# Patient Record
Sex: Female | Born: 1955
Health system: Southern US, Community
[De-identification: ages and names within clinical notes are randomized; demographics above are authoritative.]

## PROBLEM LIST (undated history)

## (undated) DIAGNOSIS — F329 Major depressive disorder, single episode, unspecified: Secondary | ICD-10-CM

## (undated) DIAGNOSIS — K5792 Diverticulitis of intestine, part unspecified, without perforation or abscess without bleeding: Secondary | ICD-10-CM

## (undated) DIAGNOSIS — I10 Essential (primary) hypertension: Secondary | ICD-10-CM

## (undated) DIAGNOSIS — J309 Allergic rhinitis, unspecified: Secondary | ICD-10-CM

## (undated) DIAGNOSIS — R270 Ataxia, unspecified: Secondary | ICD-10-CM

## (undated) DIAGNOSIS — I872 Venous insufficiency (chronic) (peripheral): Secondary | ICD-10-CM

## (undated) DIAGNOSIS — L97909 Non-pressure chronic ulcer of unspecified part of unspecified lower leg with unspecified severity: Secondary | ICD-10-CM

## (undated) DIAGNOSIS — E785 Hyperlipidemia, unspecified: Secondary | ICD-10-CM

## (undated) DIAGNOSIS — R42 Dizziness and giddiness: Secondary | ICD-10-CM

## (undated) DIAGNOSIS — I83009 Varicose veins of unspecified lower extremity with ulcer of unspecified site: Secondary | ICD-10-CM

## (undated) DIAGNOSIS — F32A Depression, unspecified: Secondary | ICD-10-CM

## (undated) DIAGNOSIS — G809 Cerebral palsy, unspecified: Secondary | ICD-10-CM

## (undated) DIAGNOSIS — K5790 Diverticulosis of intestine, part unspecified, without perforation or abscess without bleeding: Secondary | ICD-10-CM

## (undated) DIAGNOSIS — R7301 Impaired fasting glucose: Secondary | ICD-10-CM

## (undated) HISTORY — DX: Depression, unspecified: F32.A

## (undated) HISTORY — DX: Allergic rhinitis, unspecified: J30.9

## (undated) HISTORY — DX: Varicose veins of unspecified lower extremity with ulcer of unspecified site: I83.009

## (undated) HISTORY — DX: Essential (primary) hypertension: I10

## (undated) HISTORY — DX: Venous insufficiency (chronic) (peripheral): I87.2

## (undated) HISTORY — PX: LAPAROSCOPY: SHX197

## (undated) HISTORY — PX: OTHER SURGICAL HISTORY: SHX169

## (undated) HISTORY — DX: Impaired fasting glucose: R73.01

## (undated) HISTORY — DX: Diverticulitis of intestine, part unspecified, without perforation or abscess without bleeding: K57.92

## (undated) HISTORY — DX: Major depressive disorder, single episode, unspecified: F32.9

## (undated) HISTORY — DX: Diverticulosis of intestine, part unspecified, without perforation or abscess without bleeding: K57.90

## (undated) HISTORY — DX: Dizziness and giddiness: R42

## (undated) HISTORY — PX: ABDOMINAL HYSTERECTOMY: SHX81

## (undated) HISTORY — DX: Cerebral palsy, unspecified: G80.9

## (undated) HISTORY — DX: Ataxia, unspecified: R27.0

## (undated) HISTORY — DX: Non-pressure chronic ulcer of unspecified part of unspecified lower leg with unspecified severity: L97.909

## (undated) HISTORY — DX: Hyperlipidemia, unspecified: E78.5

---

## 2006-04-15 ENCOUNTER — Ambulatory Visit: Payer: Self-pay | Admitting: Neurology

## 2008-02-16 ENCOUNTER — Ambulatory Visit: Payer: Self-pay | Admitting: Unknown Physician Specialty

## 2008-02-17 ENCOUNTER — Other Ambulatory Visit: Payer: Self-pay

## 2008-02-17 ENCOUNTER — Emergency Department: Payer: Self-pay | Admitting: Emergency Medicine

## 2008-02-23 ENCOUNTER — Inpatient Hospital Stay: Payer: Self-pay | Admitting: Unknown Physician Specialty

## 2010-11-11 ENCOUNTER — Emergency Department: Payer: Self-pay | Admitting: Unknown Physician Specialty

## 2010-11-18 ENCOUNTER — Emergency Department: Payer: Self-pay | Admitting: Emergency Medicine

## 2011-12-24 DIAGNOSIS — G809 Cerebral palsy, unspecified: Secondary | ICD-10-CM | POA: Diagnosis not present

## 2011-12-24 DIAGNOSIS — I69993 Ataxia following unspecified cerebrovascular disease: Secondary | ICD-10-CM | POA: Diagnosis not present

## 2011-12-28 DIAGNOSIS — M79609 Pain in unspecified limb: Secondary | ICD-10-CM | POA: Diagnosis not present

## 2011-12-28 DIAGNOSIS — M7989 Other specified soft tissue disorders: Secondary | ICD-10-CM | POA: Diagnosis not present

## 2011-12-28 DIAGNOSIS — I872 Venous insufficiency (chronic) (peripheral): Secondary | ICD-10-CM | POA: Diagnosis not present

## 2011-12-28 DIAGNOSIS — I739 Peripheral vascular disease, unspecified: Secondary | ICD-10-CM | POA: Diagnosis not present

## 2012-05-31 DIAGNOSIS — E785 Hyperlipidemia, unspecified: Secondary | ICD-10-CM | POA: Diagnosis not present

## 2012-05-31 DIAGNOSIS — I10 Essential (primary) hypertension: Secondary | ICD-10-CM | POA: Diagnosis not present

## 2012-06-09 DIAGNOSIS — I69993 Ataxia following unspecified cerebrovascular disease: Secondary | ICD-10-CM | POA: Diagnosis not present

## 2012-06-09 DIAGNOSIS — G809 Cerebral palsy, unspecified: Secondary | ICD-10-CM | POA: Diagnosis not present

## 2012-06-29 DIAGNOSIS — I69993 Ataxia following unspecified cerebrovascular disease: Secondary | ICD-10-CM | POA: Diagnosis not present

## 2012-06-29 DIAGNOSIS — G809 Cerebral palsy, unspecified: Secondary | ICD-10-CM | POA: Diagnosis not present

## 2012-08-23 DIAGNOSIS — Z23 Encounter for immunization: Secondary | ICD-10-CM | POA: Diagnosis not present

## 2012-10-31 DIAGNOSIS — E7800 Pure hypercholesterolemia, unspecified: Secondary | ICD-10-CM | POA: Diagnosis not present

## 2012-10-31 DIAGNOSIS — F329 Major depressive disorder, single episode, unspecified: Secondary | ICD-10-CM | POA: Diagnosis not present

## 2012-10-31 DIAGNOSIS — J309 Allergic rhinitis, unspecified: Secondary | ICD-10-CM | POA: Diagnosis not present

## 2012-10-31 DIAGNOSIS — R5381 Other malaise: Secondary | ICD-10-CM | POA: Diagnosis not present

## 2012-10-31 DIAGNOSIS — R42 Dizziness and giddiness: Secondary | ICD-10-CM | POA: Diagnosis not present

## 2012-10-31 DIAGNOSIS — I10 Essential (primary) hypertension: Secondary | ICD-10-CM | POA: Diagnosis not present

## 2012-10-31 DIAGNOSIS — Z0000 Encounter for general adult medical examination without abnormal findings: Secondary | ICD-10-CM | POA: Diagnosis not present

## 2012-12-26 DIAGNOSIS — G809 Cerebral palsy, unspecified: Secondary | ICD-10-CM | POA: Diagnosis not present

## 2012-12-26 DIAGNOSIS — I69993 Ataxia following unspecified cerebrovascular disease: Secondary | ICD-10-CM | POA: Diagnosis not present

## 2013-01-25 DIAGNOSIS — K644 Residual hemorrhoidal skin tags: Secondary | ICD-10-CM | POA: Diagnosis not present

## 2013-06-09 DIAGNOSIS — L259 Unspecified contact dermatitis, unspecified cause: Secondary | ICD-10-CM | POA: Diagnosis not present

## 2013-06-09 DIAGNOSIS — I69993 Ataxia following unspecified cerebrovascular disease: Secondary | ICD-10-CM | POA: Diagnosis not present

## 2013-06-09 DIAGNOSIS — G809 Cerebral palsy, unspecified: Secondary | ICD-10-CM | POA: Diagnosis not present

## 2013-06-13 DIAGNOSIS — L03119 Cellulitis of unspecified part of limb: Secondary | ICD-10-CM | POA: Diagnosis not present

## 2013-06-13 DIAGNOSIS — F329 Major depressive disorder, single episode, unspecified: Secondary | ICD-10-CM | POA: Diagnosis not present

## 2013-06-13 DIAGNOSIS — E7800 Pure hypercholesterolemia, unspecified: Secondary | ICD-10-CM | POA: Diagnosis not present

## 2013-06-13 DIAGNOSIS — L0291 Cutaneous abscess, unspecified: Secondary | ICD-10-CM | POA: Diagnosis not present

## 2013-06-13 DIAGNOSIS — L02419 Cutaneous abscess of limb, unspecified: Secondary | ICD-10-CM | POA: Diagnosis not present

## 2013-06-13 DIAGNOSIS — I10 Essential (primary) hypertension: Secondary | ICD-10-CM | POA: Diagnosis not present

## 2013-06-22 DIAGNOSIS — Z1231 Encounter for screening mammogram for malignant neoplasm of breast: Secondary | ICD-10-CM | POA: Diagnosis not present

## 2013-06-22 DIAGNOSIS — N63 Unspecified lump in unspecified breast: Secondary | ICD-10-CM | POA: Diagnosis not present

## 2013-06-22 DIAGNOSIS — G809 Cerebral palsy, unspecified: Secondary | ICD-10-CM | POA: Diagnosis not present

## 2013-06-30 DIAGNOSIS — N6489 Other specified disorders of breast: Secondary | ICD-10-CM | POA: Diagnosis not present

## 2013-06-30 DIAGNOSIS — R928 Other abnormal and inconclusive findings on diagnostic imaging of breast: Secondary | ICD-10-CM | POA: Diagnosis not present

## 2013-09-11 DIAGNOSIS — Z23 Encounter for immunization: Secondary | ICD-10-CM | POA: Diagnosis not present

## 2013-10-03 DIAGNOSIS — I69993 Ataxia following unspecified cerebrovascular disease: Secondary | ICD-10-CM | POA: Diagnosis not present

## 2013-10-03 DIAGNOSIS — G809 Cerebral palsy, unspecified: Secondary | ICD-10-CM | POA: Diagnosis not present

## 2013-11-02 DIAGNOSIS — I872 Venous insufficiency (chronic) (peripheral): Secondary | ICD-10-CM | POA: Diagnosis not present

## 2013-11-02 DIAGNOSIS — Z1331 Encounter for screening for depression: Secondary | ICD-10-CM | POA: Diagnosis not present

## 2013-11-02 DIAGNOSIS — Z0000 Encounter for general adult medical examination without abnormal findings: Secondary | ICD-10-CM | POA: Diagnosis not present

## 2013-11-02 DIAGNOSIS — E785 Hyperlipidemia, unspecified: Secondary | ICD-10-CM | POA: Diagnosis not present

## 2013-11-02 DIAGNOSIS — R7301 Impaired fasting glucose: Secondary | ICD-10-CM | POA: Diagnosis not present

## 2013-11-02 DIAGNOSIS — I10 Essential (primary) hypertension: Secondary | ICD-10-CM | POA: Diagnosis not present

## 2014-05-03 DIAGNOSIS — E785 Hyperlipidemia, unspecified: Secondary | ICD-10-CM | POA: Diagnosis not present

## 2014-05-03 DIAGNOSIS — I10 Essential (primary) hypertension: Secondary | ICD-10-CM | POA: Diagnosis not present

## 2014-09-07 DIAGNOSIS — Z23 Encounter for immunization: Secondary | ICD-10-CM | POA: Diagnosis not present

## 2014-11-14 DIAGNOSIS — F339 Major depressive disorder, recurrent, unspecified: Secondary | ICD-10-CM | POA: Diagnosis not present

## 2014-11-14 DIAGNOSIS — I10 Essential (primary) hypertension: Secondary | ICD-10-CM | POA: Diagnosis not present

## 2014-11-14 DIAGNOSIS — Z0000 Encounter for general adult medical examination without abnormal findings: Secondary | ICD-10-CM | POA: Diagnosis not present

## 2014-11-14 DIAGNOSIS — R7301 Impaired fasting glucose: Secondary | ICD-10-CM | POA: Diagnosis not present

## 2014-11-14 DIAGNOSIS — K5732 Diverticulitis of large intestine without perforation or abscess without bleeding: Secondary | ICD-10-CM | POA: Diagnosis not present

## 2014-11-14 DIAGNOSIS — E785 Hyperlipidemia, unspecified: Secondary | ICD-10-CM | POA: Diagnosis not present

## 2014-11-14 DIAGNOSIS — G809 Cerebral palsy, unspecified: Secondary | ICD-10-CM | POA: Diagnosis not present

## 2014-11-14 DIAGNOSIS — I872 Venous insufficiency (chronic) (peripheral): Secondary | ICD-10-CM | POA: Diagnosis not present

## 2014-11-21 DIAGNOSIS — Z1231 Encounter for screening mammogram for malignant neoplasm of breast: Secondary | ICD-10-CM | POA: Diagnosis not present

## 2015-01-05 ENCOUNTER — Emergency Department: Payer: Self-pay | Admitting: Emergency Medicine

## 2015-01-05 DIAGNOSIS — R590 Localized enlarged lymph nodes: Secondary | ICD-10-CM | POA: Diagnosis not present

## 2015-01-05 DIAGNOSIS — R1031 Right lower quadrant pain: Secondary | ICD-10-CM | POA: Diagnosis not present

## 2015-01-05 DIAGNOSIS — N289 Disorder of kidney and ureter, unspecified: Secondary | ICD-10-CM | POA: Diagnosis not present

## 2015-01-05 DIAGNOSIS — N2889 Other specified disorders of kidney and ureter: Secondary | ICD-10-CM | POA: Diagnosis not present

## 2015-01-05 DIAGNOSIS — Z90710 Acquired absence of both cervix and uterus: Secondary | ICD-10-CM | POA: Diagnosis not present

## 2015-01-05 DIAGNOSIS — K769 Liver disease, unspecified: Secondary | ICD-10-CM | POA: Diagnosis not present

## 2015-01-05 DIAGNOSIS — N390 Urinary tract infection, site not specified: Secondary | ICD-10-CM | POA: Diagnosis not present

## 2015-01-05 DIAGNOSIS — K8020 Calculus of gallbladder without cholecystitis without obstruction: Secondary | ICD-10-CM | POA: Diagnosis not present

## 2015-02-16 DIAGNOSIS — L97521 Non-pressure chronic ulcer of other part of left foot limited to breakdown of skin: Secondary | ICD-10-CM | POA: Diagnosis not present

## 2015-02-19 DIAGNOSIS — I872 Venous insufficiency (chronic) (peripheral): Secondary | ICD-10-CM | POA: Diagnosis not present

## 2015-02-19 DIAGNOSIS — I83005 Varicose veins of unspecified lower extremity with ulcer other part of foot: Secondary | ICD-10-CM | POA: Diagnosis not present

## 2015-02-19 DIAGNOSIS — G809 Cerebral palsy, unspecified: Secondary | ICD-10-CM | POA: Diagnosis not present

## 2015-05-15 ENCOUNTER — Ambulatory Visit (INDEPENDENT_AMBULATORY_CARE_PROVIDER_SITE_OTHER): Payer: Medicare Other | Admitting: Family Medicine

## 2015-05-15 ENCOUNTER — Encounter: Payer: Self-pay | Admitting: Family Medicine

## 2015-05-15 VITALS — BP 101/66 | HR 92 | Temp 98.9°F | Ht 59.0 in | Wt 160.0 lb

## 2015-05-15 DIAGNOSIS — J069 Acute upper respiratory infection, unspecified: Secondary | ICD-10-CM

## 2015-05-15 DIAGNOSIS — I10 Essential (primary) hypertension: Secondary | ICD-10-CM | POA: Diagnosis not present

## 2015-05-15 DIAGNOSIS — E785 Hyperlipidemia, unspecified: Secondary | ICD-10-CM | POA: Diagnosis not present

## 2015-05-15 DIAGNOSIS — G809 Cerebral palsy, unspecified: Secondary | ICD-10-CM | POA: Diagnosis not present

## 2015-05-15 LAB — LP+ALT+AST PICCOLO, WAIVED
ALT (SGPT) Piccolo, Waived: 18 U/L (ref 10–47)
AST (SGOT) PICCOLO, WAIVED: 17 U/L (ref 11–38)
CHOL/HDL RATIO PICCOLO,WAIVE: 3.3 mg/dL
Cholesterol Piccolo, Waived: 165 mg/dL (ref <200)
HDL Chol Piccolo, Waived: 50 mg/dL — ABNORMAL LOW (ref >59)
LDL Chol Calc Piccolo Waived: 70 mg/dL (ref <100)
TRIGLYCERIDES PICCOLO,WAIVED: 222 mg/dL — AB (ref <150)
VLDL Chol Calc Piccolo,Waive: 44 mg/dL — ABNORMAL HIGH (ref <30)

## 2015-05-15 MED ORDER — AZITHROMYCIN 250 MG PO TABS: ORAL_TABLET | ORAL | Status: DC

## 2015-05-15 MED ORDER — SIMVASTATIN 20 MG PO TABS: 20.0000 mg | ORAL_TABLET | Freq: Every day | ORAL | Status: DC

## 2015-05-15 MED ORDER — FLUOXETINE HCL 20 MG PO CAPS: 20.0000 mg | ORAL_CAPSULE | Freq: Every day | ORAL | Status: DC

## 2015-05-15 MED ORDER — LORAZEPAM 1 MG PO TABS: 1.0000 mg | ORAL_TABLET | Freq: Every day | ORAL | Status: DC | PRN

## 2015-05-15 MED ORDER — BENAZEPRIL-HYDROCHLOROTHIAZIDE 20-12.5 MG PO TABS
1.0000 | ORAL_TABLET | Freq: Every day | ORAL | Status: DC
Start: 2015-05-15 — End: 2016-12-28

## 2015-05-15 MED ORDER — BACLOFEN 10 MG PO TABS: 10.0000 mg | ORAL_TABLET | Freq: Every day | ORAL | Status: DC

## 2015-05-15 NOTE — Assessment & Plan Note (Signed)
........  The current medical regimen is effective;  continue present plan and medications. In wheelchair spasm lt leg foot with resulting occ skin breakdown

## 2015-05-15 NOTE — Assessment & Plan Note (Signed)
The current medical regimen is effective;  continue present plan and medications.  

## 2015-05-15 NOTE — Progress Notes (Signed)
BP 101/66 mmHg  Pulse 92  Temp(Src) 98.9 F (37.2 C)  Ht 4\' 11"  (1.499 m)  Wt 160 lb (72.576 kg)  BMI 32.30 kg/m2  SpO2 93%   Subjective:    Patient ID: Michelle Chandler, female    DOB: 01-Oct-1956, 59 y.o.   MRN: [id number]  HPI: Michelle Chandler is a 59 y.o. female  Chief Complaint  Patient presents with  . Hyperlipidemia  . Hypertension  . Depression  . Foot Pain    needs documentation for ortho. shoes  . URI  pt having a foot stick out and rubbing with ulceration from CP spacticity Other dx stable meds taking every day and no side effects Nerves depression stable  URI just started lass than 24hr more head cold No tick bites    Relevant past medical, surgical, family and social history reviewed and updated as indicated. Interim medical history since our last visit reviewed. Allergies and medications reviewed and updated.  Review of Systems  Constitutional: Positive for fatigue. Negative for fever.  HENT: Positive for congestion, ear pain, sinus pressure, sore throat, trouble swallowing and voice change. Negative for mouth sores.   Respiratory: Positive for cough.   Cardiovascular: Negative.     Per HPI unless specifically indicated above     Objective:    BP 101/66 mmHg  Pulse 92  Temp(Src) 98.9 F (37.2 C)  Ht 4\' 11"  (1.499 m)  Wt 160 lb (72.576 kg)  BMI 32.30 kg/m2  SpO2 93%  Wt Readings from Last 3 Encounters:  05/15/15 160 lb (72.576 kg)  11/14/14 158 lb (71.668 kg)    Physical Exam  Constitutional: She is oriented to person, place, and time. She appears well-developed and well-nourished. No distress.  HENT:  Head: Normocephalic and atraumatic.  Right Ear: Hearing and external ear normal.  Left Ear: Hearing and external ear normal.  Nose: Nose normal.  Mouth/Throat: No oropharyngeal exudate or tonsillar abscesses.  Throat inflamed  Eyes: Conjunctivae and lids are normal. Right eye exhibits no discharge. Left eye exhibits no discharge. No scleral  icterus.  Pulmonary/Chest: Effort normal. No respiratory distress.  Musculoskeletal:  Rt foot inward twist  Neurological: She is alert and oriented to person, place, and time. Coordination abnormal.  Skin: Skin is intact. No rash noted.  Psychiatric: She has a normal mood and affect. Her speech is normal and behavior is normal. Judgment and thought content normal. Cognition and memory are normal.    No results found for this or any previous visit.    Assessment & Plan:   Problem List Items Addressed This Visit      Cardiovascular and Mediastinum   Hypertension    The current medical regimen is effective;  continue present plan and medications.       Relevant Medications   simvastatin (ZOCOR) 20 MG tablet   benazepril-hydrochlorthiazide (LOTENSIN HCT) 20-12.5 MG per tablet     Nervous and Auditory   CP (cerebral palsy)    ......Marland KitchenMarland KitchenThe current medical regimen is effective;  continue present plan and medications. In wheelchair spasm lt leg foot with resulting occ skin breakdown      Relevant Medications   LORazepam (ATIVAN) 1 MG tablet   FLUoxetine (PROZAC) 20 MG capsule   baclofen (LIORESAL) 10 MG tablet     Other   Hyperlipidemia    The current medical regimen is effective;  continue present plan and medications.       Relevant Medications   simvastatin (ZOCOR) 20 MG tablet  benazepril-hydrochlorthiazide (LOTENSIN HCT) 20-12.5 MG per tablet    Other Visit Diagnoses    Essential hypertension, benign    -  Primary    Relevant Medications    simvastatin (ZOCOR) 20 MG tablet    benazepril-hydrochlorthiazide (LOTENSIN HCT) 20-12.5 MG per tablet    Other Relevant Orders    Basic metabolic panel    LP+ALT+AST Piccolo, Waived    Hyperlipemia        Relevant Medications    simvastatin (ZOCOR) 20 MG tablet    benazepril-hydrochlorthiazide (LOTENSIN HCT) 20-12.5 MG per tablet    Other Relevant Orders    Basic metabolic panel    LP+ALT+AST Piccolo, Waived    Upper  respiratory infection        discuss care and with CP usually needs antiobitics will give rx to hold and start only if worese after several days    Relevant Medications    mupirocin ointment (BACTROBAN) 2 %    azithromycin (ZITHROMAX) 250 MG tablet        Follow up plan: Return in about 6 months (around 11/14/2015) for Physical Exam.

## 2015-05-16 LAB — BASIC METABOLIC PANEL
BUN/Creatinine Ratio: 10 (ref 9–23)
BUN: 6 mg/dL (ref 6–24)
CALCIUM: 9.7 mg/dL (ref 8.7–10.2)
CO2: 23 mmol/L (ref 18–29)
Chloride: 95 mmol/L — ABNORMAL LOW (ref 97–108)
Creatinine, Ser: 0.63 mg/dL (ref 0.57–1.00)
GFR calc Af Amer: 115 mL/min/{1.73_m2} (ref >59)
GFR, EST NON AFRICAN AMERICAN: 100 mL/min/{1.73_m2} (ref >59)
Glucose: 100 mg/dL — ABNORMAL HIGH (ref 65–99)
Potassium: 4.1 mmol/L (ref 3.5–5.2)
Sodium: 136 mmol/L (ref 134–144)

## 2015-05-28 ENCOUNTER — Telehealth: Payer: Self-pay | Admitting: Family Medicine

## 2015-05-28 DIAGNOSIS — G801 Spastic diplegic cerebral palsy: Secondary | ICD-10-CM

## 2015-05-28 NOTE — Telephone Encounter (Signed)
referal for foot problems with cp

## 2015-07-01 DIAGNOSIS — M79674 Pain in right toe(s): Secondary | ICD-10-CM | POA: Diagnosis not present

## 2015-07-01 DIAGNOSIS — M2011 Hallux valgus (acquired), right foot: Secondary | ICD-10-CM | POA: Diagnosis not present

## 2015-07-01 DIAGNOSIS — M79675 Pain in left toe(s): Secondary | ICD-10-CM | POA: Diagnosis not present

## 2015-07-01 DIAGNOSIS — M2041 Other hammer toe(s) (acquired), right foot: Secondary | ICD-10-CM | POA: Diagnosis not present

## 2015-07-01 DIAGNOSIS — B351 Tinea unguium: Secondary | ICD-10-CM | POA: Diagnosis not present

## 2015-09-16 DIAGNOSIS — Z23 Encounter for immunization: Secondary | ICD-10-CM | POA: Diagnosis not present

## 2015-11-20 ENCOUNTER — Encounter: Payer: Self-pay | Admitting: Family Medicine

## 2015-11-20 ENCOUNTER — Other Ambulatory Visit: Payer: Self-pay | Admitting: Family Medicine

## 2015-11-20 ENCOUNTER — Ambulatory Visit (INDEPENDENT_AMBULATORY_CARE_PROVIDER_SITE_OTHER): Payer: Medicare Other | Admitting: Family Medicine

## 2015-11-20 VITALS — BP 113/72 | HR 77 | Temp 98.8°F | Ht <= 58 in | Wt 153.0 lb

## 2015-11-20 DIAGNOSIS — Z113 Encounter for screening for infections with a predominantly sexual mode of transmission: Secondary | ICD-10-CM

## 2015-11-20 DIAGNOSIS — M7551 Bursitis of right shoulder: Secondary | ICD-10-CM | POA: Diagnosis not present

## 2015-11-20 DIAGNOSIS — I10 Essential (primary) hypertension: Secondary | ICD-10-CM

## 2015-11-20 DIAGNOSIS — E785 Hyperlipidemia, unspecified: Secondary | ICD-10-CM | POA: Diagnosis not present

## 2015-11-20 DIAGNOSIS — M7550 Bursitis of unspecified shoulder: Secondary | ICD-10-CM | POA: Insufficient documentation

## 2015-11-20 DIAGNOSIS — F339 Major depressive disorder, recurrent, unspecified: Secondary | ICD-10-CM | POA: Insufficient documentation

## 2015-11-20 DIAGNOSIS — F329 Major depressive disorder, single episode, unspecified: Secondary | ICD-10-CM | POA: Insufficient documentation

## 2015-11-20 DIAGNOSIS — Z1211 Encounter for screening for malignant neoplasm of colon: Secondary | ICD-10-CM

## 2015-11-20 DIAGNOSIS — G809 Cerebral palsy, unspecified: Secondary | ICD-10-CM

## 2015-11-20 DIAGNOSIS — F32A Depression, unspecified: Secondary | ICD-10-CM

## 2015-11-20 DIAGNOSIS — Z0000 Encounter for general adult medical examination without abnormal findings: Secondary | ICD-10-CM

## 2015-11-20 HISTORY — DX: Bursitis of unspecified shoulder: M75.50

## 2015-11-20 LAB — URINALYSIS, ROUTINE W REFLEX MICROSCOPIC
Bilirubin, UA: NEGATIVE
GLUCOSE, UA: NEGATIVE
KETONES UA: NEGATIVE
NITRITE UA: NEGATIVE
Protein, UA: NEGATIVE
SPEC GRAV UA: 1.01 (ref 1.005–1.030)
UUROB: 0.2 mg/dL (ref 0.2–1.0)
pH, UA: 7 (ref 5.0–7.5)

## 2015-11-20 LAB — MICROSCOPIC EXAMINATION

## 2015-11-20 LAB — FECAL OCCULT BLOOD, GUAIAC
SPECIMEN 3: NEGATIVE
Specimen 1: NEGATIVE
Specimen 2: NEGATIVE

## 2015-11-20 MED ORDER — MELOXICAM 15 MG PO TABS: 15.0000 mg | ORAL_TABLET | Freq: Every day | ORAL | Status: DC

## 2015-11-20 NOTE — Assessment & Plan Note (Signed)
The current medical regimen is effective;  continue present plan and medications.  

## 2015-11-20 NOTE — Progress Notes (Signed)
BP 113/72 mmHg  Pulse 77  Temp(Src) 98.8 F (37.1 C)  Ht 4' 9.2" (1.453 m)  Wt 153 lb (69.4 kg)  BMI 32.87 kg/m2  SpO2 96%   Subjective:    Patient ID: Michelle Chandler, female    DOB: [birth year], 60 y.o.   MRN: [id number]  HPI: Michelle Chandler is a 60 y.o. female  Chief Complaint  Patient presents with  . Annual Exam   patient with some right shoulder pain bothers her some at night has some spurs in her neck and has wanted something she can do just occasionally. Blood pressures been doing well Nerves stable Takes rare lorazepam No issues with simvastatin Muscle relaxers for CPE stable Patient in wheelchair and accompanied by her mom.  Patient's toenails has been to podiatry for care she relates it's time to go back again   Relevant past medical, surgical, family and social history reviewed and updated as indicated. Interim medical history since our last visit reviewed. Allergies and medications reviewed and updated.  Review of Systems  Constitutional: Negative.   HENT: Negative.   Eyes: Negative.   Respiratory: Negative.   Cardiovascular: Negative.   Gastrointestinal: Negative.   Endocrine: Negative.   Genitourinary: Negative.   Musculoskeletal: Negative.   Skin: Negative.   Allergic/Immunologic: Negative.   Neurological: Negative.   Hematological: Negative.   Psychiatric/Behavioral: Negative.     Per HPI unless specifically indicated above     Objective:    BP 113/72 mmHg  Pulse 77  Temp(Src) 98.8 F (37.1 C)  Ht 4' 9.2" (1.453 m)  Wt 153 lb (69.4 kg)  BMI 32.87 kg/m2  SpO2 96%  Wt Readings from Last 3 Encounters:  11/20/15 153 lb (69.4 kg)  05/15/15 160 lb (72.576 kg)  11/14/14 158 lb (71.668 kg)    Physical Exam  Constitutional: She is oriented to person, place, and time. She appears well-developed and well-nourished.  HENT:  Head: Normocephalic and atraumatic.  Right Ear: External ear normal.  Left Ear: External ear normal.  Nose: Nose normal.   Mouth/Throat: Oropharynx is clear and moist.  Eyes: Conjunctivae and EOM are normal. Pupils are equal, round, and reactive to light.  Neck: Normal range of motion. Neck supple. Carotid bruit is not present.  Cardiovascular: Normal rate, regular rhythm and normal heart sounds.   No murmur heard. Pulmonary/Chest: Effort normal and breath sounds normal. She exhibits no mass. Right breast exhibits no mass, no skin change and no tenderness. Left breast exhibits no mass, no skin change and no tenderness. Breasts are symmetrical.  Abdominal: Soft. Bowel sounds are normal. There is no hepatosplenomegaly.  Musculoskeletal: Normal range of motion.  Bursitis changes right shoulder  Neurological: She is alert and oriented to person, place, and time.  Skin: No rash noted.  Psychiatric: She has a normal mood and affect. Her behavior is normal. Judgment and thought content normal.    Results for orders placed or performed in visit on 11/20/15  [facility] Stool Card-TAKE HOME  Result Value Ref Range   Specimen 1 Neg    Specimen 2 Neg    Specimen 3 Neg       Assessment & Plan:   Problem List Items Addressed This Visit      Cardiovascular and Mediastinum   Hypertension    The current medical regimen is effective;  continue present plan and medications.       Relevant Orders   Comprehensive metabolic panel   CBC with Differential/Platelet   Urinalysis, Routine  w reflex microscopic (not at [facility])   TSH     Nervous and Auditory   CP (cerebral palsy) (HCC)    stable      Relevant Orders   Comprehensive metabolic panel   CBC with Differential/Platelet   Urinalysis, Routine w reflex microscopic (not at [facility])   TSH     Musculoskeletal and Integument   Shoulder bursitis    Discussed care and treatment shoulder bursitis with meloxicam        Other   Hyperlipidemia    The current medical regimen is effective;  continue present plan and medications.       Relevant Orders   Comprehensive  metabolic panel   Lipid panel   CBC with Differential/Platelet   Urinalysis, Routine w reflex microscopic (not at [facility])   TSH   Depression    The current medical regimen is effective;  continue present plan and medications.       Relevant Orders   Comprehensive metabolic panel   CBC with Differential/Platelet   Urinalysis, Routine w reflex microscopic (not at [facility])   TSH    Other Visit Diagnoses    Routine screening for STI (sexually transmitted infection)    -  Primary    Relevant Orders    Hepatitis C Antibody    Colon cancer screening        Relevant Orders    [facility] Stool Card-TAKE HOME (Completed)    PE (physical exam), annual            Follow up plan: Return in about 6 months (around 05/19/2016), or if symptoms worsen or fail to improve, for Medicine check, BMP, lipids, ALT, AST.

## 2015-11-20 NOTE — Assessment & Plan Note (Signed)
stable °

## 2015-11-20 NOTE — Assessment & Plan Note (Signed)
Discussed care and treatment shoulder bursitis with meloxicam

## 2015-11-21 ENCOUNTER — Encounter: Payer: Self-pay | Admitting: Family Medicine

## 2015-11-21 LAB — CBC WITH DIFFERENTIAL/PLATELET
Basophils Absolute: 0 10*3/uL (ref 0.0–0.2)
Basos: 0 %
EOS (ABSOLUTE): 0.2 10*3/uL (ref 0.0–0.4)
EOS: 3 %
HEMATOCRIT: 43.8 % (ref 34.0–46.6)
HEMOGLOBIN: 14.8 g/dL (ref 11.1–15.9)
Immature Grans (Abs): 0 10*3/uL (ref 0.0–0.1)
Immature Granulocytes: 0 %
LYMPHS ABS: 2.2 10*3/uL (ref 0.7–3.1)
Lymphs: 24 %
MCH: 32 pg (ref 26.6–33.0)
MCHC: 33.8 g/dL (ref 31.5–35.7)
MCV: 95 fL (ref 79–97)
MONOCYTES: 6 %
Monocytes Absolute: 0.5 10*3/uL (ref 0.1–0.9)
NEUTROS ABS: 6.1 10*3/uL (ref 1.4–7.0)
Neutrophils: 67 %
PLATELETS: 359 10*3/uL (ref 150–379)
RBC: 4.63 x10E6/uL (ref 3.77–5.28)
RDW: 12.3 % (ref 12.3–15.4)
WBC: 9.1 10*3/uL (ref 3.4–10.8)

## 2015-11-21 LAB — COMPREHENSIVE METABOLIC PANEL
A/G RATIO: 1.6 (ref 1.1–2.5)
ALK PHOS: 99 IU/L (ref 39–117)
ALT: 8 IU/L (ref 0–32)
AST: 13 IU/L (ref 0–40)
Albumin: 4.2 g/dL (ref 3.5–5.5)
BILIRUBIN TOTAL: 0.3 mg/dL (ref 0.0–1.2)
BUN/Creatinine Ratio: 13 (ref 9–23)
BUN: 8 mg/dL (ref 6–24)
CHLORIDE: 95 mmol/L — AB (ref 96–106)
CO2: 26 mmol/L (ref 18–29)
Calcium: 9.9 mg/dL (ref 8.7–10.2)
Creatinine, Ser: 0.63 mg/dL (ref 0.57–1.00)
GFR calc Af Amer: 115 mL/min/{1.73_m2} (ref >59)
GFR calc non Af Amer: 100 mL/min/{1.73_m2} (ref >59)
Globulin, Total: 2.7 g/dL (ref 1.5–4.5)
Glucose: 97 mg/dL (ref 65–99)
POTASSIUM: 4.4 mmol/L (ref 3.5–5.2)
Sodium: 136 mmol/L (ref 134–144)
Total Protein: 6.9 g/dL (ref 6.0–8.5)

## 2015-11-21 LAB — TSH: TSH: 1.7 u[IU]/mL (ref 0.450–4.500)

## 2015-11-21 LAB — LIPID PANEL
CHOLESTEROL TOTAL: 194 mg/dL (ref 100–199)
Chol/HDL Ratio: 4 ratio units (ref 0.0–4.4)
HDL: 49 mg/dL (ref >39)
LDL Calculated: 117 mg/dL — ABNORMAL HIGH (ref 0–99)
TRIGLYCERIDES: 140 mg/dL (ref 0–149)
VLDL Cholesterol Cal: 28 mg/dL (ref 5–40)

## 2015-11-21 LAB — HEPATITIS C ANTIBODY: HEP C VIRUS AB: 0.4 {s_co_ratio} (ref 0.0–0.9)

## 2015-12-20 DIAGNOSIS — L97501 Non-pressure chronic ulcer of other part of unspecified foot limited to breakdown of skin: Secondary | ICD-10-CM | POA: Diagnosis not present

## 2015-12-26 DIAGNOSIS — L97501 Non-pressure chronic ulcer of other part of unspecified foot limited to breakdown of skin: Secondary | ICD-10-CM | POA: Diagnosis not present

## 2015-12-26 DIAGNOSIS — B353 Tinea pedis: Secondary | ICD-10-CM | POA: Diagnosis not present

## 2015-12-27 DIAGNOSIS — I10 Essential (primary) hypertension: Secondary | ICD-10-CM | POA: Insufficient documentation

## 2015-12-27 DIAGNOSIS — E785 Hyperlipidemia, unspecified: Secondary | ICD-10-CM | POA: Insufficient documentation

## 2015-12-27 DIAGNOSIS — G809 Cerebral palsy, unspecified: Secondary | ICD-10-CM | POA: Insufficient documentation

## 2016-04-15 ENCOUNTER — Telehealth: Payer: Self-pay | Admitting: Family Medicine

## 2016-04-15 NOTE — Telephone Encounter (Signed)
Mr. Michelle Chandler called and stated that the pt needs a letter for a new bed. The company she got her original bed from has now closed and but they are looking to go with [employer] to get the replacement. Number fax is [phone].

## 2016-04-15 NOTE — Telephone Encounter (Signed)
Rx faxed

## 2016-06-16 ENCOUNTER — Telehealth: Payer: Self-pay | Admitting: Family Medicine

## 2016-06-16 NOTE — Telephone Encounter (Signed)
Patient may ask for letter at her appointment as brother is not on release

## 2016-06-16 NOTE — Telephone Encounter (Signed)
Eddie Dibbles called and stated that the pt needed a letter stating she is capable of handling her own finances and that she is of sound mind. Pt has an appt scheduled for 06/23/16.

## 2016-06-23 ENCOUNTER — Encounter: Payer: Self-pay | Admitting: Family Medicine

## 2016-06-23 ENCOUNTER — Ambulatory Visit (INDEPENDENT_AMBULATORY_CARE_PROVIDER_SITE_OTHER): Payer: Medicare Other | Admitting: Family Medicine

## 2016-06-23 VITALS — BP 94/59 | HR 71 | Temp 98.2°F | Ht 59.2 in | Wt 152.0 lb

## 2016-06-23 DIAGNOSIS — I10 Essential (primary) hypertension: Secondary | ICD-10-CM

## 2016-06-23 DIAGNOSIS — F32A Depression, unspecified: Secondary | ICD-10-CM

## 2016-06-23 DIAGNOSIS — E785 Hyperlipidemia, unspecified: Secondary | ICD-10-CM

## 2016-06-23 DIAGNOSIS — F329 Major depressive disorder, single episode, unspecified: Secondary | ICD-10-CM | POA: Diagnosis not present

## 2016-06-23 DIAGNOSIS — G801 Spastic diplegic cerebral palsy: Secondary | ICD-10-CM

## 2016-06-23 LAB — LP+ALT+AST PICCOLO, WAIVED
ALT (SGPT) Piccolo, Waived: 47 U/L (ref 10–47)
AST (SGOT) PICCOLO, WAIVED: 48 U/L — AB (ref 11–38)
CHOL/HDL RATIO PICCOLO,WAIVE: 2.8 mg/dL
Cholesterol Piccolo, Waived: 159 mg/dL (ref <200)
HDL Chol Piccolo, Waived: 56 mg/dL — ABNORMAL LOW (ref >59)
LDL CHOL CALC PICCOLO WAIVED: 68 mg/dL (ref <100)
Triglycerides Piccolo,Waived: 175 mg/dL — ABNORMAL HIGH (ref <150)
VLDL Chol Calc Piccolo,Waive: 35 mg/dL — ABNORMAL HIGH (ref <30)

## 2016-06-23 MED ORDER — LORAZEPAM 1 MG PO TABS: 1.0000 mg | ORAL_TABLET | Freq: Every day | ORAL | 1 refills | Status: DC | PRN

## 2016-06-23 NOTE — Progress Notes (Signed)
BP (!) 94/59 (BP Location: Left Arm, Patient Position: Sitting, Cuff Size: Small)   Pulse 71   Temp 98.2 F (36.8 C)   Ht 4' 11.2" (1.504 m) Comment: with shoes  Wt 152 lb (68.9 kg) Comment: with shoes  SpO2 96%   BMI 30.49 kg/m    Subjective:    Patient ID: Michelle Chandler, female    DOB: 1956-05-28, 60 y.o.   MRN: [id number]  HPI: Michelle Chandler is a 60 y.o. female  Chief Complaint  Patient presents with  . Hypertension  . Hyperlipidemia  . Depression  . Need a Letter to [employer]  . Address needing new hospital bed  Patient recheck hypertension doing well good blood pressure with weight loss sometimes blood pressure gets a little low with standing but all in all does well and is only happened a couple of times as not interested in changing medicines at this time Cholesterol doing well with no complaints does well with medicines Depression anxiety still has occasional anxiety especially after the death of her dad lorazepam prescription 60 tablets total given in January needs refill  Patient with cerebral palsy and has caregiver who is with her assists some with history the patient is fully mentally competent and aware has difficulty with walking his is a wheelchair mostly. But usually is on a cane, mostly for around the house activities but uses a wheelchair to go. Patient has a hospital bed which is worn out and doesn't tilt or bring feet up. Patient has a trapezius on her hospital bed which assists her in getting in and out of the bed area and she's had this bed for bout 5 years. Needs new one. This bed allows her to sleep independently and [place] get out of bed and in bed independently. Patient had a face-to-face visit regarding this today.  Sheets MS is been stable for years but still has marked trouble with walking and balance has leg length discrepancy and motor strength and coordination impairments due to MS. Has occasional real bad hand cramping but is able to use her upper extremities  in general with good coordination. Patient's has been mentally alert has been assisted by her mother but wants to do planning as her mother's health is failing. Patient has had no mental impairment and is mentally competent and functionally able to manage her on finances.  Relevant past medical, surgical, family and social history reviewed and updated as indicated. Interim medical history since our last visit reviewed. Allergies and medications reviewed and updated.  Review of Systems  Constitutional: Negative.   Respiratory: Negative.   Cardiovascular: Negative.     Per HPI unless specifically indicated above     Objective:    BP (!) 94/59 (BP Location: Left Arm, Patient Position: Sitting, Cuff Size: Small)   Pulse 71   Temp 98.2 F (36.8 C)   Ht 4' 11.2" (1.504 m) Comment: with shoes  Wt 152 lb (68.9 kg) Comment: with shoes  SpO2 96%   BMI 30.49 kg/m   Wt Readings from Last 3 Encounters:  06/23/16 152 lb (68.9 kg)  11/20/15 153 lb (69.4 kg)  05/15/15 160 lb (72.6 kg)    Physical Exam  Constitutional: She is oriented to person, place, and time. She appears well-developed and well-nourished. No distress.  HENT:  Head: Normocephalic and atraumatic.  Right Ear: Hearing normal.  Left Ear: Hearing normal.  Nose: Nose normal.  Eyes: Conjunctivae and lids are normal. Right eye exhibits no discharge. Left eye  exhibits no discharge. No scleral icterus.  Cardiovascular: Normal rate, regular rhythm and normal heart sounds.   Pulmonary/Chest: Effort normal and breath sounds normal. No respiratory distress.  Musculoskeletal: Normal range of motion. She exhibits deformity. She exhibits no edema.  Neurological: She is alert and oriented to person, place, and time.  Skin: Skin is intact. No rash noted.  Psychiatric: She has a normal mood and affect. Her speech is normal and behavior is normal. Judgment and thought content normal. Cognition and memory are normal.    Results for orders  placed or performed in visit on 11/20/15  Microscopic Examination  Result Value Ref Range   WBC, UA 11-30 (A) 0 - 5 /hpf   RBC, UA 3-10 (A) 0 - 2 /hpf   Epithelial Cells (non renal) 0-10 0 - 10 /hpf   Renal Epithel, UA 0-10 None seen /hpf   Bacteria, UA Moderate (A) None seen/Few  Hepatitis C Antibody  Result Value Ref Range   Hep C Virus Ab 0.4 0.0 - 0.9 s/co ratio  [facility] Stool Card-TAKE HOME  Result Value Ref Range   Specimen 1 Neg    Specimen 2 Neg    Specimen 3 Neg   Comprehensive metabolic panel  Result Value Ref Range   Glucose 97 65 - 99 mg/dL   BUN 8 6 - 24 mg/dL   Creatinine, Ser 0.63 0.57 - 1.00 mg/dL   GFR calc non Af Amer 100 >59 mL/min/1.73   GFR calc Af Amer 115 >59 mL/min/1.73   BUN/Creatinine Ratio 13 9 - 23   Sodium 136 134 - 144 mmol/L   Potassium 4.4 3.5 - 5.2 mmol/L   Chloride 95 (L) 96 - 106 mmol/L   CO2 26 18 - 29 mmol/L   Calcium 9.9 8.7 - 10.2 mg/dL   Total Protein 6.9 6.0 - 8.5 g/dL   Albumin 4.2 3.5 - 5.5 g/dL   Globulin, Total 2.7 1.5 - 4.5 g/dL   Albumin/Globulin Ratio 1.6 1.1 - 2.5   Bilirubin Total 0.3 0.0 - 1.2 mg/dL   Alkaline Phosphatase 99 39 - 117 IU/L   AST 13 0 - 40 IU/L   ALT 8 0 - 32 IU/L  Lipid panel  Result Value Ref Range   Cholesterol, Total 194 100 - 199 mg/dL   Triglycerides 140 0 - 149 mg/dL   HDL 49 >39 mg/dL   VLDL Cholesterol Cal 28 5 - 40 mg/dL   LDL Calculated 117 (H) 0 - 99 mg/dL   Chol/HDL Ratio 4.0 0.0 - 4.4 ratio units  CBC with Differential/Platelet  Result Value Ref Range   WBC 9.1 3.4 - 10.8 x10E3/uL   RBC 4.63 3.77 - 5.28 x10E6/uL   Hemoglobin 14.8 11.1 - 15.9 g/dL   Hematocrit 43.8 34.0 - 46.6 %   MCV 95 79 - 97 fL   MCH 32.0 26.6 - 33.0 pg   MCHC 33.8 31.5 - 35.7 g/dL   RDW 12.3 12.3 - 15.4 %   Platelets 359 150 - 379 x10E3/uL   Neutrophils 67 %   Lymphs 24 %   Monocytes 6 %   Eos 3 %   Basos 0 %   Neutrophils Absolute 6.1 1.4 - 7.0 x10E3/uL   Lymphocytes Absolute 2.2 0.7 - 3.1 x10E3/uL    Monocytes Absolute 0.5 0.1 - 0.9 x10E3/uL   EOS (ABSOLUTE) 0.2 0.0 - 0.4 x10E3/uL   Basophils Absolute 0.0 0.0 - 0.2 x10E3/uL   Immature Granulocytes 0 %   Immature Grans (  Abs) 0.0 0.0 - 0.1 x10E3/uL  Urinalysis, Routine w reflex microscopic (not at [facility])  Result Value Ref Range   Specific Gravity, UA 1.010 1.005 - 1.030   pH, UA 7.0 5.0 - 7.5   Color, UA Yellow Yellow   Appearance Ur Clear Clear   Leukocytes, UA 3+ (A) Negative   Protein, UA Negative Negative/Trace   Glucose, UA Negative Negative   Ketones, UA Negative Negative   RBC, UA 1+ (A) Negative   Bilirubin, UA Negative Negative   Urobilinogen, Ur 0.2 0.2 - 1.0 mg/dL   Nitrite, UA Negative Negative   Microscopic Examination See below:   TSH  Result Value Ref Range   TSH 1.700 0.450 - 4.500 uIU/mL      Assessment & Plan:   Problem List Items Addressed This Visit      Cardiovascular and Mediastinum   Hypertension - Primary    Discuss blood pressure care and treatment will leave medications alone for now observe symptoms      Relevant Orders   LP+ALT+AST Piccolo, Waived   Basic metabolic panel     Nervous and Auditory   CP (cerebral palsy) (HCC)    Patient stable and mentally competent        Other   Hyperlipidemia    The current medical regimen is effective;  continue present plan and medications.       Relevant Orders   LP+ALT+AST Piccolo, Waived   Basic metabolic panel   Depression    Discuss Will give fresh refill on lorazepam      Relevant Medications   LORazepam (ATIVAN) 1 MG tablet    Other Visit Diagnoses   None.      Follow up plan: Return in about 6 months (around 12/24/2016) for Physical Exam.

## 2016-06-23 NOTE — Assessment & Plan Note (Signed)
Discuss blood pressure care and treatment will leave medications alone for now observe symptoms

## 2016-06-23 NOTE — Assessment & Plan Note (Signed)
Patient stable and mentally competent

## 2016-06-23 NOTE — Assessment & Plan Note (Signed)
The current medical regimen is effective;  continue present plan and medications.  

## 2016-06-23 NOTE — Assessment & Plan Note (Signed)
Discuss Will give fresh refill on lorazepam

## 2016-06-24 ENCOUNTER — Encounter: Payer: Self-pay | Admitting: Family Medicine

## 2016-06-24 LAB — BASIC METABOLIC PANEL
BUN/Creatinine Ratio: 24 — ABNORMAL HIGH (ref 9–23)
BUN: 19 mg/dL (ref 6–24)
CALCIUM: 9.6 mg/dL (ref 8.7–10.2)
CO2: 25 mmol/L (ref 18–29)
CREATININE: 0.78 mg/dL (ref 0.57–1.00)
Chloride: 98 mmol/L (ref 96–106)
GFR calc Af Amer: 97 mL/min/{1.73_m2} (ref >59)
GFR, EST NON AFRICAN AMERICAN: 84 mL/min/{1.73_m2} (ref >59)
Glucose: 88 mg/dL (ref 65–99)
POTASSIUM: 4.4 mmol/L (ref 3.5–5.2)
SODIUM: 137 mmol/L (ref 134–144)

## 2016-06-29 ENCOUNTER — Encounter: Payer: Self-pay | Admitting: Family Medicine

## 2016-08-12 ENCOUNTER — Other Ambulatory Visit: Payer: Self-pay | Admitting: Family Medicine

## 2016-08-12 ENCOUNTER — Other Ambulatory Visit: Payer: Self-pay

## 2016-08-12 DIAGNOSIS — E785 Hyperlipidemia, unspecified: Secondary | ICD-10-CM

## 2016-08-12 MED ORDER — FLUOXETINE HCL 20 MG PO CAPS: 20.0000 mg | ORAL_CAPSULE | Freq: Every day | ORAL | 0 refills | Status: DC

## 2016-08-12 MED ORDER — BACLOFEN 10 MG PO TABS: 10.0000 mg | ORAL_TABLET | Freq: Every day | ORAL | 0 refills | Status: DC

## 2016-08-12 MED ORDER — SIMVASTATIN 20 MG PO TABS: 20.0000 mg | ORAL_TABLET | Freq: Every day | ORAL | 0 refills | Status: DC

## 2016-08-12 NOTE — Telephone Encounter (Signed)
[address] is requesting 90 day refills for  Fluoxetine 20mg  Cap QD Simvastatin 20mg  Tab QD Baclofen 10mg  Tab QD

## 2016-09-20 ENCOUNTER — Encounter: Payer: Self-pay | Admitting: *Deleted

## 2016-09-20 ENCOUNTER — Emergency Department: Payer: Medicare Other

## 2016-09-20 ENCOUNTER — Emergency Department
Admission: EM | Admit: 2016-09-20 | Discharge: 2016-09-20 | Disposition: A | Discharge status: Home / Self Care | Payer: Medicare Other | Attending: Emergency Medicine | Admitting: Emergency Medicine

## 2016-09-20 DIAGNOSIS — S82435A Nondisplaced oblique fracture of shaft of left fibula, initial encounter for closed fracture: Secondary | ICD-10-CM | POA: Diagnosis not present

## 2016-09-20 DIAGNOSIS — I10 Essential (primary) hypertension: Secondary | ICD-10-CM | POA: Insufficient documentation

## 2016-09-20 DIAGNOSIS — Z79899 Other long term (current) drug therapy: Secondary | ICD-10-CM | POA: Insufficient documentation

## 2016-09-20 DIAGNOSIS — F17210 Nicotine dependence, cigarettes, uncomplicated: Secondary | ICD-10-CM | POA: Insufficient documentation

## 2016-09-20 DIAGNOSIS — W010XXA Fall on same level from slipping, tripping and stumbling without subsequent striking against object, initial encounter: Secondary | ICD-10-CM | POA: Diagnosis not present

## 2016-09-20 DIAGNOSIS — Y939 Activity, unspecified: Secondary | ICD-10-CM | POA: Diagnosis not present

## 2016-09-20 DIAGNOSIS — S82832A Other fracture of upper and lower end of left fibula, initial encounter for closed fracture: Secondary | ICD-10-CM

## 2016-09-20 DIAGNOSIS — S8292XA Unspecified fracture of left lower leg, initial encounter for closed fracture: Secondary | ICD-10-CM | POA: Diagnosis not present

## 2016-09-20 DIAGNOSIS — Y92009 Unspecified place in unspecified non-institutional (private) residence as the place of occurrence of the external cause: Secondary | ICD-10-CM | POA: Insufficient documentation

## 2016-09-20 DIAGNOSIS — Z7982 Long term (current) use of aspirin: Secondary | ICD-10-CM | POA: Insufficient documentation

## 2016-09-20 DIAGNOSIS — S8992XA Unspecified injury of left lower leg, initial encounter: Secondary | ICD-10-CM | POA: Diagnosis present

## 2016-09-20 DIAGNOSIS — Y999 Unspecified external cause status: Secondary | ICD-10-CM | POA: Diagnosis not present

## 2016-09-20 DIAGNOSIS — G809 Cerebral palsy, unspecified: Secondary | ICD-10-CM | POA: Insufficient documentation

## 2016-09-20 MED ORDER — OXYCODONE HCL 5 MG PO TABS: 2.5000 mg | ORAL_TABLET | Freq: Four times a day (QID) | ORAL | 0 refills | Status: DC | PRN

## 2016-09-20 MED ORDER — OXYCODONE-ACETAMINOPHEN 5-325 MG PO TABS
1.0000 | ORAL_TABLET | Freq: Once | ORAL | Status: AC
  Administered 2016-09-20: 1 via ORAL
  Filled 2016-09-20: qty 1

## 2016-09-20 NOTE — Clinical Social Work Note (Signed)
Clinical Social Work Assessment  Patient Details  Name: Michelle Chandler MRN: [id number] Date of Birth: 05-19-1956  Date of referral:  09/20/16               Reason for consult:  Emotional/Coping/Adjustment to Illness, [employer]                Permission sought to share information with:  Family Supports, Customer service manager Permission granted to share information::  Yes, Verbal Permission Granted  Name::     Seryn Bartie [phone] Mother  Agency::     Relationship::     Contact Information:     Housing/Transportation Living arrangements for the past 2 months:  [place] of Information:  Patient, Parent Patient Interpreter Needed:  None Criminal Activity/Legal Involvement Pertinent to Current Situation/Hospitalization:  No - Comment as needed Significant Relationships:  Church, [employer], Parents (Car givers) Lives with:    Do you feel safe going back to the place where you live?  Yes Need for family participation in patient care:  Yes (Comment)  Care giving concerns: Concerned that patient needs are increasing with CP and now patient has a fracture on her right leg.   Social Worker assessment / plan: LCSW introduced myself with patient and her family. It was expressed that they feel patient has a lot of physical needs and they cant keep up with the demands. LCSW explained information and resources and process. Family will follow up with DSS social worker and their family doctor. Currently this patient has a respite provided on the weekends and another girl comes in every day from 9-12noon to assist patient with her ADLs and hygiene application. She reports she managed good for the most part. Patients mother had recent hip injury and the loss of her spouse recently so she is trying her best. LCSW encouraged her to start with her PCP and assess what she feels she will need in 1 year, 5 year plan and for the continued care of her daughter. Several  resources were provided to family. Did discuss the benefits of short term re-hab both private pay and through medicaid. Employment status:  Disabled (Comment on whether or not currently receiving Disability) Insurance information:  Medicaid In [place], [place] PT Recommendations:  Not assessed at this time Information / Referral to community resources:   (Out patient rehab/in patient rehab)  Patient/Family's Response to care:  Would like to see her manage better  Patient/Family's Understanding of and Emotional Response to Diagnosis, Current Treatment, and Prognosis:  Family now understands the patients needs and family concerns and together they will work out a future plan that will support everyone.  Emotional Assessment Appearance:  Appears stated age Attitude/Demeanor/Rapport:  Reactive, Guarded Affect (typically observed):  Accepting, Adaptable, Apprehensive Orientation:  Oriented to Self, Oriented to Place, Oriented to  Time, Oriented to Situation Alcohol / Substance use:    Psych involvement (Current and /or in the community):  No (Comment)  Discharge Needs  Concerns to be addressed:  Care Coordination Readmission within the last 30 days:  No Current discharge risk:  Physical Impairment Barriers to Discharge:  Family Issues   Joana Reamer, LCSW 09/20/2016, 3:24 PM

## 2016-09-20 NOTE — Discharge Instructions (Signed)
Please use walker to ambulate. Limit weightbearing on left leg. Call orthopedics in 1 day for follow-up appointment. Rest ice and elevate the lower extremity. Take Aleve and oxycodone as needed for pain.

## 2016-09-20 NOTE — ED Provider Notes (Signed)
[facility] Provider Note   CSN: [id number] Arrival date & time: 09/20/16  1237     History   Chief Complaint Chief Complaint  Patient presents with  . Leg Injury    HPI Michelle Chandler is a 60 y.o. female with a history of cerebral palsy who resents to the emergency department for evaluation of left ankle pain. Patient fell earlier this morning, sat on her left ankle. She denies any other injury to her body. She was taken to urgent care facility where x-rays showed a nondisplaced oblique distal fibular fracture with no syndesmosis disruption. She was placed into an Ace wrap and sent to the emergency department. Patient and mother are concerned about caring for her at home. She normally ambulates with a 4 prong cane, she is concerned that she would not be able to get around without pain with full weight on the left leg. They have 3 steps at home. Patient has 2 caretakers with her here today. Patient's pain is severe 10 out of 10. She has not had any medications for pain. She denies any other head injury, neck pain or hip pain.    HPI  Past Medical History:  Diagnosis Date  . Allergic rhinitis   . Ataxia   . CP (cerebral palsy) ([place])   . Depression   . Diverticulitis   . Diverticulosis   . Hyperlipidemia   . Hypertension   . IFG (impaired fasting glucose)   . Venous insufficiency   . Venous stasis ulcer ([place])   . Vertigo     Patient Active Problem List   Diagnosis Date Noted  . Depression 11/20/2015  . Shoulder bursitis 11/20/2015  . CP (cerebral palsy) ([place])   . Hyperlipidemia   . Hypertension     Past Surgical History:  Procedure Laterality Date  . ABDOMINAL HYSTERECTOMY     complete  . LAPAROSCOPY    . legs and hip     due to CP    OB History    No data available       Home Medications    Prior to Admission medications   Medication Sig Start Date End Date Taking? Authorizing Provider  aspirin EC 81 MG tablet Take 81 mg by mouth daily.     Historical Provider, MD  baclofen (LIORESAL) 10 MG tablet Take 1 tablet (10 mg total) by mouth daily. 08/12/16   Megan P Johnson, DO  benazepril-hydrochlorthiazide (LOTENSIN HCT) 20-12.5 MG per tablet Take 1 tablet by mouth daily. 05/15/15   Guadalupe Maple, MD  FLUoxetine (PROZAC) 20 MG capsule Take 1 capsule (20 mg total) by mouth daily. 08/12/16   Megan P Johnson, DO  Incontinence Supply Disposable (ENTRUST PLUS DISP UNDERPADS) MISC by Does not apply route.    Historical Provider, MD  Incontinence Supply Disposable (PROTECTIVE UNDERWEAR LARGE) MISC by Does not apply route.    Historical Provider, MD  LORazepam (ATIVAN) 1 MG tablet Take 1 tablet (1 mg total) by mouth daily as needed for anxiety. 06/23/16   Guadalupe Maple, MD  meloxicam (MOBIC) 15 MG tablet Take 1 tablet (15 mg total) by mouth daily. 11/20/15   Guadalupe Maple, MD  mupirocin ointment (BACTROBAN) 2 % Apply topically as needed. 12/20/15   Historical Provider, MD  oxyCODONE (ROXICODONE) 5 MG immediate release tablet Take 0.5-1 tablets (2.5-5 mg total) by mouth every 6 (six) hours as needed. 09/20/16 09/20/17  Duanne Guess, PA-C  simvastatin (ZOCOR) 20 MG tablet Take 1 tablet (20  mg total) by mouth daily. 08/12/16   [place], DO    Family History Family History  Problem Relation Age of Onset  . Heart disease Maternal Uncle 46  . Kidney disease Maternal Uncle     Social History Social History  Substance Use Topics  . Smoking status: Current Every Day Smoker    Packs/day: 0.50    Types: Cigarettes  . Smokeless tobacco: Never Used  . Alcohol use No     Allergies   Patient has no known allergies.   Review of Systems Review of Systems  Constitutional: Negative for activity change, chills, fatigue and fever.  HENT: Negative for congestion, sinus pressure and sore throat.   Eyes: Negative for visual disturbance.  Respiratory: Negative for cough, chest tightness and shortness of breath.   Cardiovascular: Negative for  chest pain and leg swelling.  Gastrointestinal: Negative for abdominal pain, diarrhea, nausea and vomiting.  Genitourinary: Negative for dysuria.  Musculoskeletal: Positive for arthralgias, gait problem and joint swelling.  Skin: Negative for rash.  Neurological: Negative for weakness, numbness and headaches.  Hematological: Negative for adenopathy.  Psychiatric/Behavioral: Negative for agitation, behavioral problems and confusion.     Physical Exam Updated Vital Signs BP (!) 141/74 (BP Location: Left Arm)   Pulse 94   Temp 98.7 F (37.1 C) (Oral)   Ht 5' (1.524 m)   Wt 70.3 kg   SpO2 98%   BMI 30.27 kg/m   Physical Exam  Constitutional: She is oriented to person, place, and time. She appears well-developed and well-nourished. No distress.  HENT:  Head: Normocephalic and atraumatic.  Right Ear: External ear normal.  Left Ear: External ear normal.  Nose: Nose normal.  Eyes: Conjunctivae and EOM are normal. Right eye exhibits no discharge. Left eye exhibits no discharge.  Neck: Normal range of motion. Neck supple.  Cardiovascular: Normal rate and intact distal pulses.   Pulmonary/Chest: Effort normal. No respiratory distress.  Musculoskeletal: She exhibits no edema.  Examination of the left lower extremity shows patient has moderate swelling of the lateral aspect ankle. She has tenderness of the distal fibula. No medial malleolus tenderness. She has 2+ dorsalis pedis pulses. She is nontender along the knee. She has good internal and external rotation of the hip with no discomfort.  Neurological: She is alert and oriented to person, place, and time. She has normal reflexes. Coordination normal.  Skin: Skin is warm and dry. No rash noted. No erythema.  Psychiatric: She has a normal mood and affect. Her behavior is normal. Thought content normal.     ED Treatments / Results  Labs (all labs ordered are listed, but only abnormal results are displayed) Labs Reviewed - No data to  display  EKG  EKG Interpretation None       Radiology No results found.  Procedures Procedures (including critical care time) SPLINT APPLICATION Date/Time: 99991111 PM Authorized by: Feliberto Gottron Consent: Verbal consent obtained. Risks and benefits: risks, benefits and alternatives were discussed Consent given by: patient Splint applied by: ED tech Location details: Left posterior stirrup left ankle  Splint type: Posterior stirrup short leg  Supplies used: Ortho-Glass, Ace wrap, prewrap, patient also given a walker  Post-procedure: The splinted body part was neurovascularly unchanged following the procedure. Patient tolerance: Patient tolerated the procedure well with no immediate complications.     Medications Ordered in ED Medications  oxyCODONE-acetaminophen (PERCOCET/ROXICET) 5-325 MG per tablet 1 tablet (1 tablet Oral Given 09/20/16 1449)     Initial  Impression / Assessment and Plan / ED Course  I have reviewed the triage vital signs and the nursing notes.  Pertinent labs & imaging results that were available during my care of the patient were reviewed by me and considered in my medical decision making (see chart for details).  Clinical Course     60 year old female with cerebral palsy fell earlier today, fractured her left distal fibula shaft. X-rays reviewed by me from urgent care facility on a disc show oblique distal fibular shaft fracture. No sign of dislocation. No syndesmosis disruption. No medial or posterior malleolus fracture. Patient is placed into a posterior stirrup splint. Care management and social worker discussed options for the patient. Patient has elected to return home but will follow-up with PCP to discuss long-term care. Patient feels safe going home with 2 caretakers at this time. She is given a walker. She will continue with Aleve as needed for pain. She is given a prescription for oxycodone 5 mg, 0.5-1 tablet by mouth every 6 hours as  needed for severe pain. Patient has a wheelchair at home. Return to the ER for any worsening symptoms urgent changes in her health.  Final Clinical Impressions(s) / ED Diagnoses   Final diagnoses:  Nondisplaced fracture of distal end of left fibula    New Prescriptions New Prescriptions   OXYCODONE (ROXICODONE) 5 MG IMMEDIATE RELEASE TABLET    Take 0.5-1 tablets (2.5-5 mg total) by mouth every 6 (six) hours as needed.     Duanne Guess, PA-C 09/20/16 [phone]

## 2016-09-20 NOTE — ED Notes (Signed)
Pt from home with c/o mechanical fall today at home. Pt was seen at [facility] urgent care this morning and was told she has a tibia fracture. Pt denies any other injuries, denies any LOC.

## 2016-09-20 NOTE — ED Notes (Signed)
Social worker at bedside.

## 2016-09-20 NOTE — ED Triage Notes (Signed)
Pt fell and fractured left leg, pt sent from [facility], pt lost balance and tripped, pt did not hit head

## 2016-09-20 NOTE — Progress Notes (Signed)
Met with family completed assessment and discussed provider resources. They are taking the patient home and will follow up with PCP and increase patients home support. She has an attendant who comes out daily from 9-12 to assist with hygiene application and ADLs. She in addition has a respite provided for the weekends. No further needs patient will resume care by her PCP and family and friends and return home.   Derk Doubek LCSW

## 2016-09-22 DIAGNOSIS — S8262XA Displaced fracture of lateral malleolus of left fibula, initial encounter for closed fracture: Secondary | ICD-10-CM | POA: Diagnosis not present

## 2016-09-22 DIAGNOSIS — S8263XA Displaced fracture of lateral malleolus of unspecified fibula, initial encounter for closed fracture: Secondary | ICD-10-CM | POA: Insufficient documentation

## 2016-09-30 ENCOUNTER — Telehealth: Payer: Self-pay | Admitting: Family Medicine

## 2016-09-30 NOTE — Telephone Encounter (Signed)
Spoke with patient's mom, paperwork to be faxed to our office to be completed.

## 2016-09-30 NOTE — Telephone Encounter (Signed)
Left message for them to return my call. We will be on the lookout for any orders.

## 2016-10-06 DIAGNOSIS — S8262XA Displaced fracture of lateral malleolus of left fibula, initial encounter for closed fracture: Secondary | ICD-10-CM | POA: Diagnosis not present

## 2016-10-20 DIAGNOSIS — S8262XA Displaced fracture of lateral malleolus of left fibula, initial encounter for closed fracture: Secondary | ICD-10-CM | POA: Diagnosis not present

## 2016-11-06 DIAGNOSIS — Z23 Encounter for immunization: Secondary | ICD-10-CM | POA: Diagnosis not present

## 2016-11-10 DIAGNOSIS — S8262XA Displaced fracture of lateral malleolus of left fibula, initial encounter for closed fracture: Secondary | ICD-10-CM | POA: Diagnosis not present

## 2016-11-11 ENCOUNTER — Other Ambulatory Visit: Payer: Self-pay | Admitting: Family Medicine

## 2016-11-11 DIAGNOSIS — I10 Essential (primary) hypertension: Secondary | ICD-10-CM

## 2016-11-11 MED ORDER — LORAZEPAM 1 MG PO TABS: 1.0000 mg | ORAL_TABLET | Freq: Every day | ORAL | 0 refills | Status: DC | PRN

## 2016-11-11 NOTE — Telephone Encounter (Signed)
Last OV: 06/23/16  A&P For that visit:  Follow up plan: Return in about 6 months (around 12/24/2016) for Physical Exam.   Ativan 1 mg last refilled 06/23/16 30 tabs, 1 refill.   Next OV: 12/28/16

## 2016-11-11 NOTE — Telephone Encounter (Signed)
Called Lorazepam in and [place] stated that they had the benazepril rx on file. Please refuse medication so we can close the encounter.

## 2016-11-11 NOTE — Telephone Encounter (Signed)
She should have enough of the benazepril to last until June. Rx for lorazepam OK to call in.

## 2016-11-30 DIAGNOSIS — S8262XD Displaced fracture of lateral malleolus of left fibula, subsequent encounter for closed fracture with routine healing: Secondary | ICD-10-CM | POA: Diagnosis not present

## 2016-12-23 DIAGNOSIS — M25572 Pain in left ankle and joints of left foot: Secondary | ICD-10-CM | POA: Diagnosis not present

## 2016-12-23 DIAGNOSIS — M25672 Stiffness of left ankle, not elsewhere classified: Secondary | ICD-10-CM | POA: Diagnosis not present

## 2016-12-23 DIAGNOSIS — R2689 Other abnormalities of gait and mobility: Secondary | ICD-10-CM | POA: Diagnosis not present

## 2016-12-25 DIAGNOSIS — M25572 Pain in left ankle and joints of left foot: Secondary | ICD-10-CM | POA: Diagnosis not present

## 2016-12-25 DIAGNOSIS — M25672 Stiffness of left ankle, not elsewhere classified: Secondary | ICD-10-CM | POA: Diagnosis not present

## 2016-12-28 ENCOUNTER — Encounter: Payer: Self-pay | Admitting: Family Medicine

## 2016-12-28 ENCOUNTER — Ambulatory Visit (INDEPENDENT_AMBULATORY_CARE_PROVIDER_SITE_OTHER): Payer: Medicare Other | Admitting: Family Medicine

## 2016-12-28 VITALS — BP 130/78 | HR 71 | Ht 60.0 in | Wt 150.0 lb

## 2016-12-28 DIAGNOSIS — Z1211 Encounter for screening for malignant neoplasm of colon: Secondary | ICD-10-CM | POA: Diagnosis not present

## 2016-12-28 DIAGNOSIS — F3342 Major depressive disorder, recurrent, in full remission: Secondary | ICD-10-CM | POA: Diagnosis not present

## 2016-12-28 DIAGNOSIS — Z1239 Encounter for other screening for malignant neoplasm of breast: Secondary | ICD-10-CM

## 2016-12-28 DIAGNOSIS — I10 Essential (primary) hypertension: Secondary | ICD-10-CM

## 2016-12-28 DIAGNOSIS — G801 Spastic diplegic cerebral palsy: Secondary | ICD-10-CM | POA: Diagnosis not present

## 2016-12-28 DIAGNOSIS — Z1231 Encounter for screening mammogram for malignant neoplasm of breast: Secondary | ICD-10-CM

## 2016-12-28 DIAGNOSIS — E785 Hyperlipidemia, unspecified: Secondary | ICD-10-CM

## 2016-12-28 DIAGNOSIS — E7800 Pure hypercholesterolemia, unspecified: Secondary | ICD-10-CM

## 2016-12-28 DIAGNOSIS — Z0000 Encounter for general adult medical examination without abnormal findings: Secondary | ICD-10-CM | POA: Diagnosis not present

## 2016-12-28 DIAGNOSIS — Z1329 Encounter for screening for other suspected endocrine disorder: Secondary | ICD-10-CM

## 2016-12-28 DIAGNOSIS — Z114 Encounter for screening for human immunodeficiency virus [HIV]: Secondary | ICD-10-CM | POA: Diagnosis not present

## 2016-12-28 MED ORDER — LORAZEPAM 1 MG PO TABS: 1.0000 mg | ORAL_TABLET | Freq: Every day | ORAL | 0 refills | Status: DC | PRN

## 2016-12-28 MED ORDER — FLUOXETINE HCL 20 MG PO CAPS: 20.0000 mg | ORAL_CAPSULE | Freq: Every day | ORAL | 4 refills | Status: DC

## 2016-12-28 MED ORDER — BACLOFEN 10 MG PO TABS: 10.0000 mg | ORAL_TABLET | Freq: Every day | ORAL | 4 refills | Status: DC

## 2016-12-28 MED ORDER — MELOXICAM 15 MG PO TABS: 15.0000 mg | ORAL_TABLET | Freq: Every day | ORAL | 1 refills | Status: DC

## 2016-12-28 MED ORDER — CYCLOBENZAPRINE HCL 5 MG PO TABS: 5.0000 mg | ORAL_TABLET | Freq: Three times a day (TID) | ORAL | 4 refills | Status: DC | PRN

## 2016-12-28 MED ORDER — BENAZEPRIL-HYDROCHLOROTHIAZIDE 20-12.5 MG PO TABS: 1.0000 | ORAL_TABLET | Freq: Every day | ORAL | 4 refills | Status: DC

## 2016-12-28 MED ORDER — SIMVASTATIN 20 MG PO TABS: 20.0000 mg | ORAL_TABLET | Freq: Every day | ORAL | 4 refills | Status: DC

## 2016-12-28 NOTE — Progress Notes (Signed)
BP 130/78   Pulse 71   Ht 5' (1.524 m)   Wt 150 lb (68 kg)   BMI 29.29 kg/m    Subjective:    Patient ID: Michelle Chandler, female    DOB: Jan 03, 1956, 61 y.o.   MRN: [id number]  HPI: Michelle Chandler is a 61 y.o. female  Chief Complaint  Patient presents with  . Annual Exam  AWV metrics met Patient follow-up doing well all in all fell and broke her left distal fibula which is been healing well without problems patient's taking medications without issues has been taking lorazepam pretty much every night. Has had some anxieties especially with her broken leg tries not to use a wheelchair at home uses a walker very prudently does use a wheelchair went out like [employer] or here at the [place] office.  Relevant past medical, surgical, family and social history reviewed and updated as indicated. Interim medical history since our last visit reviewed. Allergies and medications reviewed and updated.  Review of Systems  Constitutional: Negative.   HENT: Negative.   Eyes: Negative.   Respiratory: Negative.   Cardiovascular: Negative.   Gastrointestinal: Negative.   Endocrine: Negative.   Genitourinary: Negative.   Musculoskeletal: Negative.   Skin: Negative.   Allergic/Immunologic: Negative.   Neurological: Negative.   Hematological: Negative.   Psychiatric/Behavioral: Negative.     Per HPI unless specifically indicated above     Objective:    BP 130/78   Pulse 71   Ht 5' (1.524 m)   Wt 150 lb (68 kg)   BMI 29.29 kg/m   Wt Readings from Last 3 Encounters:  12/28/16 150 lb (68 kg)  09/20/16 155 lb (70.3 kg)  06/23/16 152 lb (68.9 kg)    Physical Exam  Constitutional: She is oriented to person, place, and time. She appears well-developed and well-nourished.  HENT:  Head: Normocephalic and atraumatic.  Right Ear: External ear normal.  Left Ear: External ear normal.  Nose: Nose normal.  Mouth/Throat: Oropharynx is clear and moist.  Eyes: Conjunctivae and EOM are normal.  Pupils are equal, round, and reactive to light.  Neck: Normal range of motion. Neck supple. Carotid bruit is not present.  Cardiovascular: Normal rate, regular rhythm and normal heart sounds.   No murmur heard. Pulmonary/Chest: Effort normal and breath sounds normal. She exhibits no mass. Right breast exhibits no mass, no skin change and no tenderness. Left breast exhibits no mass, no skin change and no tenderness. Breasts are symmetrical.  Abdominal: Soft. Bowel sounds are normal. There is no hepatosplenomegaly.  Musculoskeletal: Normal range of motion.  Right bunion with callus formation  Neurological: She is alert and oriented to person, place, and time.  Skin: No rash noted.  Psychiatric: She has a normal mood and affect. Her behavior is normal. Judgment and thought content normal.    Results for orders placed or performed in visit on 06/23/16  LP+ALT+AST Piccolo, [employer]  Result Value Ref Range   ALT (SGPT) Piccolo, Waived 47 10 - 47 U/L   AST (SGOT) Piccolo, Waived 48 (H) 11 - 38 U/L   Cholesterol Piccolo, Waived 159 <200 mg/dL   HDL Chol Piccolo, Waived 56 (L) >59 mg/dL   Triglycerides Piccolo,Waived 175 (H) <150 mg/dL   Chol/HDL Ratio Piccolo,Waive 2.8 mg/dL   LDL Chol Calc Piccolo Waived 68 <100 mg/dL   VLDL Chol Calc Piccolo,Waive 35 (H) <30 mg/dL  Basic metabolic panel  Result Value Ref Range   Glucose 88 65 - 99 mg/dL  BUN 19 6 - 24 mg/dL   Creatinine, Ser 0.78 0.57 - 1.00 mg/dL   GFR calc non Af Amer 84 >59 mL/min/1.73   GFR calc Af Amer 97 >59 mL/min/1.73   BUN/Creatinine Ratio 24 (H) 9 - 23   Sodium 137 134 - 144 mmol/L   Potassium 4.4 3.5 - 5.2 mmol/L   Chloride 98 96 - 106 mmol/L   CO2 25 18 - 29 mmol/L   Calcium 9.6 8.7 - 10.2 mg/dL      Assessment & Plan:   Problem List Items Addressed This Visit      Cardiovascular and Mediastinum   Hypertension    The current medical regimen is effective;  continue present plan and medications.       Relevant  Medications   benazepril-hydrochlorthiazide (LOTENSIN HCT) 20-12.5 MG tablet   simvastatin (ZOCOR) 20 MG tablet   Other Relevant Orders   CBC with Differential/Platelet   Comprehensive metabolic panel   Lipid panel   Urinalysis, Routine w reflex microscopic     Nervous and Auditory   CP (cerebral palsy) ([facility])    The current medical regimen is effective;  continue present plan and medications.         Other   Hyperlipidemia    The current medical regimen is effective;  continue present plan and medications.       Relevant Medications   benazepril-hydrochlorthiazide (LOTENSIN HCT) 20-12.5 MG tablet   simvastatin (ZOCOR) 20 MG tablet   Other Relevant Orders   CBC with Differential/Platelet   Comprehensive metabolic panel   Lipid panel   Urinalysis, Routine w reflex microscopic   Depression    The current medical regimen is effective;  continue present plan and medications.        Relevant Medications   FLUoxetine (PROZAC) 20 MG capsule   LORazepam (ATIVAN) 1 MG tablet    Other Visit Diagnoses    Annual physical exam    -  Primary   Relevant Orders   CBC with Differential/Platelet   Comprehensive metabolic panel   Lipid panel   TSH   Urinalysis, Routine w reflex microscopic   HIV antibody   Thyroid disorder screen       Relevant Orders   TSH   Encounter for screening for HIV       Relevant Orders   HIV antibody   Colon cancer screening       Relevant Orders   Cologuard   Breast cancer screening       Relevant Orders   MM DIGITAL SCREENING BILATERAL   Essential hypertension, benign       Relevant Medications   benazepril-hydrochlorthiazide (LOTENSIN HCT) 20-12.5 MG tablet   simvastatin (ZOCOR) 20 MG tablet       Follow up plan: Return in about 6 months (around 06/27/2017) for BMP,  Lipids, ALT, AST.

## 2016-12-28 NOTE — Assessment & Plan Note (Signed)
The current medical regimen is effective;  continue present plan and medications.  

## 2016-12-28 NOTE — Assessment & Plan Note (Addendum)
The current medical regimen is effective;  continue present plan and medications.  

## 2016-12-29 DIAGNOSIS — M25572 Pain in left ankle and joints of left foot: Secondary | ICD-10-CM | POA: Diagnosis not present

## 2016-12-29 DIAGNOSIS — M25672 Stiffness of left ankle, not elsewhere classified: Secondary | ICD-10-CM | POA: Diagnosis not present

## 2016-12-29 LAB — COMPREHENSIVE METABOLIC PANEL
ALBUMIN: 4.8 g/dL (ref 3.5–5.5)
ALT: 10 IU/L (ref 0–32)
AST: 19 IU/L (ref 0–40)
Albumin/Globulin Ratio: 1.5 (ref 1.2–2.2)
Alkaline Phosphatase: 130 IU/L — ABNORMAL HIGH (ref 39–117)
BUN / CREAT RATIO: 16 (ref 9–23)
BUN: 9 mg/dL (ref 6–24)
Bilirubin Total: 0.3 mg/dL (ref 0.0–1.2)
CALCIUM: 10.3 mg/dL — AB (ref 8.7–10.2)
CO2: 24 mmol/L (ref 18–29)
CREATININE: 0.56 mg/dL — AB (ref 0.57–1.00)
Chloride: 93 mmol/L — ABNORMAL LOW (ref 96–106)
GFR, EST AFRICAN AMERICAN: 119 mL/min/{1.73_m2} (ref >59)
GFR, EST NON AFRICAN AMERICAN: 103 mL/min/{1.73_m2} (ref >59)
GLOBULIN, TOTAL: 3.1 g/dL (ref 1.5–4.5)
Glucose: 84 mg/dL (ref 65–99)
Potassium: 5.1 mmol/L (ref 3.5–5.2)
SODIUM: 135 mmol/L (ref 134–144)
TOTAL PROTEIN: 7.9 g/dL (ref 6.0–8.5)

## 2016-12-29 LAB — URINALYSIS, ROUTINE W REFLEX MICROSCOPIC
Bilirubin, UA: NEGATIVE
Glucose, UA: NEGATIVE
Ketones, UA: NEGATIVE
NITRITE UA: NEGATIVE
PH UA: 5.5 (ref 5.0–7.5)
Protein, UA: NEGATIVE
Specific Gravity, UA: 1.01 (ref 1.005–1.030)
Urobilinogen, Ur: 0.2 mg/dL (ref 0.2–1.0)

## 2016-12-29 LAB — CBC WITH DIFFERENTIAL/PLATELET
BASOS: 0 %
Basophils Absolute: 0 10*3/uL (ref 0.0–0.2)
EOS (ABSOLUTE): 0.2 10*3/uL (ref 0.0–0.4)
EOS: 3 %
HEMATOCRIT: 47 % — AB (ref 34.0–46.6)
HEMOGLOBIN: 15.7 g/dL (ref 11.1–15.9)
IMMATURE GRANULOCYTES: 0 %
Immature Grans (Abs): 0 10*3/uL (ref 0.0–0.1)
Lymphocytes Absolute: 2.2 10*3/uL (ref 0.7–3.1)
Lymphs: 28 %
MCH: 31.5 pg (ref 26.6–33.0)
MCHC: 33.4 g/dL (ref 31.5–35.7)
MCV: 94 fL (ref 79–97)
MONOCYTES: 6 %
Monocytes Absolute: 0.5 10*3/uL (ref 0.1–0.9)
NEUTROS PCT: 63 %
Neutrophils Absolute: 4.8 10*3/uL (ref 1.4–7.0)
Platelets: 364 10*3/uL (ref 150–379)
RBC: 4.99 x10E6/uL (ref 3.77–5.28)
RDW: 12.8 % (ref 12.3–15.4)
WBC: 7.7 10*3/uL (ref 3.4–10.8)

## 2016-12-29 LAB — TSH: TSH: 1.81 u[IU]/mL (ref 0.450–4.500)

## 2016-12-29 LAB — MICROSCOPIC EXAMINATION
BACTERIA UA: NONE SEEN
RBC MICROSCOPIC, UA: NONE SEEN /HPF (ref 0–?)

## 2016-12-29 LAB — LIPID PANEL
CHOL/HDL RATIO: 4.2 ratio (ref 0.0–4.4)
Cholesterol, Total: 243 mg/dL — ABNORMAL HIGH (ref 100–199)
HDL: 58 mg/dL (ref >39)
LDL Calculated: 139 mg/dL — ABNORMAL HIGH (ref 0–99)
TRIGLYCERIDES: 229 mg/dL — AB (ref 0–149)
VLDL Cholesterol Cal: 46 mg/dL — ABNORMAL HIGH (ref 5–40)

## 2016-12-29 LAB — HIV ANTIBODY (ROUTINE TESTING W REFLEX): HIV Screen 4th Generation wRfx: NONREACTIVE

## 2016-12-30 ENCOUNTER — Telehealth: Payer: Self-pay | Admitting: Family Medicine

## 2016-12-30 DIAGNOSIS — R748 Abnormal levels of other serum enzymes: Secondary | ICD-10-CM

## 2016-12-30 NOTE — Telephone Encounter (Signed)
Phone call Discussed with patient elevated cholesterol patient will do better with diet exercise also alkaline phosphatase slightly elevated we'll recheck and next office visit.

## 2016-12-31 DIAGNOSIS — M25672 Stiffness of left ankle, not elsewhere classified: Secondary | ICD-10-CM | POA: Diagnosis not present

## 2016-12-31 DIAGNOSIS — M25572 Pain in left ankle and joints of left foot: Secondary | ICD-10-CM | POA: Diagnosis not present

## 2017-01-04 DIAGNOSIS — M25672 Stiffness of left ankle, not elsewhere classified: Secondary | ICD-10-CM | POA: Diagnosis not present

## 2017-01-04 DIAGNOSIS — M25572 Pain in left ankle and joints of left foot: Secondary | ICD-10-CM | POA: Diagnosis not present

## 2017-01-06 DIAGNOSIS — M25672 Stiffness of left ankle, not elsewhere classified: Secondary | ICD-10-CM | POA: Diagnosis not present

## 2017-01-06 DIAGNOSIS — M25572 Pain in left ankle and joints of left foot: Secondary | ICD-10-CM | POA: Diagnosis not present

## 2017-01-08 DIAGNOSIS — M25672 Stiffness of left ankle, not elsewhere classified: Secondary | ICD-10-CM | POA: Diagnosis not present

## 2017-01-08 DIAGNOSIS — M25572 Pain in left ankle and joints of left foot: Secondary | ICD-10-CM | POA: Diagnosis not present

## 2017-01-11 DIAGNOSIS — S8262XD Displaced fracture of lateral malleolus of left fibula, subsequent encounter for closed fracture with routine healing: Secondary | ICD-10-CM | POA: Diagnosis not present

## 2017-06-29 ENCOUNTER — Ambulatory Visit (INDEPENDENT_AMBULATORY_CARE_PROVIDER_SITE_OTHER): Payer: Medicare Other | Admitting: Family Medicine

## 2017-06-29 VITALS — BP 102/65 | HR 75 | Temp 98.3°F | Wt 157.0 lb

## 2017-06-29 DIAGNOSIS — I10 Essential (primary) hypertension: Secondary | ICD-10-CM | POA: Diagnosis not present

## 2017-06-29 DIAGNOSIS — E785 Hyperlipidemia, unspecified: Secondary | ICD-10-CM

## 2017-06-29 DIAGNOSIS — Z1231 Encounter for screening mammogram for malignant neoplasm of breast: Secondary | ICD-10-CM | POA: Diagnosis not present

## 2017-06-29 DIAGNOSIS — J019 Acute sinusitis, unspecified: Secondary | ICD-10-CM

## 2017-06-29 DIAGNOSIS — Z1239 Encounter for other screening for malignant neoplasm of breast: Secondary | ICD-10-CM

## 2017-06-29 LAB — LP+ALT+AST PICCOLO, WAIVED
ALT (SGPT) Piccolo, Waived: 18 U/L (ref 10–47)
AST (SGOT) Piccolo, Waived: 19 U/L (ref 11–38)
CHOL/HDL RATIO PICCOLO,WAIVE: 3.9 mg/dL
Cholesterol Piccolo, Waived: 201 mg/dL — ABNORMAL HIGH (ref <200)
HDL CHOL PICCOLO, WAIVED: 51 mg/dL — AB (ref >59)
LDL CHOL CALC PICCOLO WAIVED: 81 mg/dL (ref <100)
Triglycerides Piccolo,Waived: 343 mg/dL — ABNORMAL HIGH (ref <150)
VLDL CHOL CALC PICCOLO,WAIVE: 69 mg/dL — AB (ref <30)

## 2017-06-29 MED ORDER — HYDROCHLOROTHIAZIDE 12.5 MG PO TABS: 12.5000 mg | ORAL_TABLET | Freq: Every day | ORAL | 2 refills | Status: DC

## 2017-06-29 MED ORDER — AMOXICILLIN 875 MG PO TABS: 875.0000 mg | ORAL_TABLET | Freq: Two times a day (BID) | ORAL | 0 refills | Status: DC

## 2017-06-29 NOTE — Assessment & Plan Note (Signed)
The current medical regimen is effective;  continue present plan and medications.  

## 2017-06-29 NOTE — Assessment & Plan Note (Signed)
Patient with developing low blood pressure still having some edema problems will stop Benzapril continue hydrochlorothiazide 12.5 observe blood pressure if not doing well will need to reconsider treatment

## 2017-06-29 NOTE — Progress Notes (Signed)
BP 102/65   Pulse 75   Temp 98.3 F (36.8 C) (Oral)   Wt 157 lb (71.2 kg)   SpO2 97%   BMI 30.66 kg/m    Subjective:    Patient ID: Michelle Chandler, female    DOB: Nov 05, 1956, 61 y.o.   MRN: [id number]  HPI: Michelle Chandler is a 61 y.o. female  Blood pressure and medication check Patient especially concerned as blood pressures been low and feeling draggy has been alternating days with taking medicine and not taking medicine. On days with no blood pressure medicine feels better blood pressure checking his been good. Days with taking medication is felt weak and draggy and blood pressure is been low. Patient also with sinus stuff going on 2-3 days feeling really bad getting worse marked congestion drainage facial pressure with bending. Has taken a Benadryl but that's about all.   Relevant past medical, surgical, family and social history reviewed and updated as indicated. Interim medical history since our last visit reviewed. Allergies and medications reviewed and updated.  Review of Systems  Constitutional: Negative.   Respiratory: Negative.   Cardiovascular: Negative.     Per HPI unless specifically indicated above     Objective:    BP 102/65   Pulse 75   Temp 98.3 F (36.8 C) (Oral)   Wt 157 lb (71.2 kg)   SpO2 97%   BMI 30.66 kg/m   Wt Readings from Last 3 Encounters:  06/29/17 157 lb (71.2 kg)  12/28/16 150 lb (68 kg)  09/20/16 155 lb (70.3 kg)    Physical Exam  Constitutional: She is oriented to person, place, and time. She appears well-developed and well-nourished.  HENT:  Head: Normocephalic and atraumatic.  Right Ear: External ear normal.  Left Ear: External ear normal.  Mouth/Throat: Oropharyngeal exudate present.  Eyes: Conjunctivae and EOM are normal.  Neck: Normal range of motion. Neck supple.  Cardiovascular: Normal rate, regular rhythm and normal heart sounds.   Pulmonary/Chest: Effort normal and breath sounds normal.  Musculoskeletal: Normal range of  motion.  In wheelchair for CP  Neurological: She is alert and oriented to person, place, and time.  Skin: No erythema.  Psychiatric: She has a normal mood and affect. Her behavior is normal. Judgment and thought content normal.    Results for orders placed or performed in visit on 12/28/16  Microscopic Examination  Result Value Ref Range   WBC, UA 0-5 0 - 5 /hpf   RBC, UA None seen 0 - 2 /hpf   Epithelial Cells (non renal) 0-10 0 - 10 /hpf   Bacteria, UA None seen None seen/Few  CBC with Differential/Platelet  Result Value Ref Range   WBC 7.7 3.4 - 10.8 x10E3/uL   RBC 4.99 3.77 - 5.28 x10E6/uL   Hemoglobin 15.7 11.1 - 15.9 g/dL   Hematocrit 47.0 (H) 34.0 - 46.6 %   MCV 94 79 - 97 fL   MCH 31.5 26.6 - 33.0 pg   MCHC 33.4 31.5 - 35.7 g/dL   RDW 12.8 12.3 - 15.4 %   Platelets 364 150 - 379 x10E3/uL   Neutrophils 63 Not Estab. %   Lymphs 28 Not Estab. %   Monocytes 6 Not Estab. %   Eos 3 Not Estab. %   Basos 0 Not Estab. %   Neutrophils Absolute 4.8 1.4 - 7.0 x10E3/uL   Lymphocytes Absolute 2.2 0.7 - 3.1 x10E3/uL   Monocytes Absolute 0.5 0.1 - 0.9 x10E3/uL   EOS (ABSOLUTE) 0.2  0.0 - 0.4 x10E3/uL   Basophils Absolute 0.0 0.0 - 0.2 x10E3/uL   Immature Granulocytes 0 Not Estab. %   Immature Grans (Abs) 0.0 0.0 - 0.1 x10E3/uL  Comprehensive metabolic panel  Result Value Ref Range   Glucose 84 65 - 99 mg/dL   BUN 9 6 - 24 mg/dL   Creatinine, Ser 0.56 (L) 0.57 - 1.00 mg/dL   GFR calc non Af Amer 103 >59 mL/min/1.73   GFR calc Af Amer 119 >59 mL/min/1.73   BUN/Creatinine Ratio 16 9 - 23   Sodium 135 134 - 144 mmol/L   Potassium 5.1 3.5 - 5.2 mmol/L   Chloride 93 (L) 96 - 106 mmol/L   CO2 24 18 - 29 mmol/L   Calcium 10.3 (H) 8.7 - 10.2 mg/dL   Total Protein 7.9 6.0 - 8.5 g/dL   Albumin 4.8 3.5 - 5.5 g/dL   Globulin, Total 3.1 1.5 - 4.5 g/dL   Albumin/Globulin Ratio 1.5 1.2 - 2.2   Bilirubin Total 0.3 0.0 - 1.2 mg/dL   Alkaline Phosphatase 130 (H) 39 - 117 IU/L   AST 19 0 -  40 IU/L   ALT 10 0 - 32 IU/L  Lipid panel  Result Value Ref Range   Cholesterol, Total 243 (H) 100 - 199 mg/dL   Triglycerides 229 (H) 0 - 149 mg/dL   HDL 58 >39 mg/dL   VLDL Cholesterol Cal 46 (H) 5 - 40 mg/dL   LDL Calculated 139 (H) 0 - 99 mg/dL   Chol/HDL Ratio 4.2 0.0 - 4.4 ratio units  TSH  Result Value Ref Range   TSH 1.810 0.450 - 4.500 uIU/mL  Urinalysis, Routine w reflex microscopic  Result Value Ref Range   Specific Gravity, UA 1.010 1.005 - 1.030   pH, UA 5.5 5.0 - 7.5   Color, UA Yellow Yellow   Appearance Ur Clear Clear   Leukocytes, UA 1+ (A) Negative   Protein, UA Negative Negative/Trace   Glucose, UA Negative Negative   Ketones, UA Negative Negative   RBC, UA 1+ (A) Negative   Bilirubin, UA Negative Negative   Urobilinogen, Ur 0.2 0.2 - 1.0 mg/dL   Nitrite, UA Negative Negative   Microscopic Examination See below:   HIV antibody  Result Value Ref Range   HIV Screen 4th Generation wRfx Non Reactive Non Reactive      Assessment & Plan:   Problem List Items Addressed This Visit      Cardiovascular and Mediastinum   Hypertension - Primary    Patient with developing low blood pressure still having some edema problems will stop Benzapril continue hydrochlorothiazide 12.5 observe blood pressure if not doing well will need to reconsider treatment      Relevant Medications   hydrochlorothiazide (HYDRODIURIL) 12.5 MG tablet   Other Relevant Orders   Basic metabolic panel   LP+ALT+AST Piccolo, Waived     Other   Hyperlipidemia    The current medical regimen is effective;  continue present plan and medications.       Relevant Medications   hydrochlorothiazide (HYDRODIURIL) 12.5 MG tablet   Other Relevant Orders   Basic metabolic panel   LP+ALT+AST Piccolo, Waived    Other Visit Diagnoses    Breast cancer screening       Relevant Orders   MM DIGITAL SCREENING BILATERAL   Acute sinusitis, recurrence not specified, unspecified location        Discussed sinusitis care and treatment will use amoxicillin may use over-the-counter medications  such as Tylenol sinus nasal rinse etc.   Relevant Medications   amoxicillin (AMOXIL) 875 MG tablet       Follow up plan: Return in about 6 months (around 12/30/2017) for Physical Exam.

## 2017-06-30 ENCOUNTER — Encounter: Payer: Self-pay | Admitting: Family Medicine

## 2017-06-30 LAB — BASIC METABOLIC PANEL
BUN/Creatinine Ratio: 19 (ref 9–23)
BUN: 12 mg/dL (ref 6–24)
CHLORIDE: 100 mmol/L (ref 96–106)
CO2: 25 mmol/L (ref 20–29)
Calcium: 9.8 mg/dL (ref 8.7–10.2)
Creatinine, Ser: 0.63 mg/dL (ref 0.57–1.00)
GFR, EST AFRICAN AMERICAN: 114 mL/min/{1.73_m2} (ref >59)
GFR, EST NON AFRICAN AMERICAN: 98 mL/min/{1.73_m2} (ref >59)
Glucose: 97 mg/dL (ref 65–99)
POTASSIUM: 4.6 mmol/L (ref 3.5–5.2)
SODIUM: 137 mmol/L (ref 134–144)

## 2017-10-19 ENCOUNTER — Ambulatory Visit (INDEPENDENT_AMBULATORY_CARE_PROVIDER_SITE_OTHER): Payer: Medicare Other | Admitting: Family Medicine

## 2017-10-19 ENCOUNTER — Encounter: Payer: Self-pay | Admitting: Family Medicine

## 2017-10-19 VITALS — BP 158/76 | HR 80 | Temp 98.7°F

## 2017-10-19 DIAGNOSIS — L84 Corns and callosities: Secondary | ICD-10-CM

## 2017-10-19 DIAGNOSIS — Z23 Encounter for immunization: Secondary | ICD-10-CM | POA: Diagnosis not present

## 2017-10-19 DIAGNOSIS — L03119 Cellulitis of unspecified part of limb: Secondary | ICD-10-CM | POA: Diagnosis not present

## 2017-10-19 DIAGNOSIS — S91301A Unspecified open wound, right foot, initial encounter: Secondary | ICD-10-CM | POA: Diagnosis not present

## 2017-10-19 MED ORDER — SULFAMETHOXAZOLE-TRIMETHOPRIM 800-160 MG PO TABS: 1.0000 | ORAL_TABLET | Freq: Two times a day (BID) | ORAL | 0 refills | Status: DC

## 2017-10-19 NOTE — Patient Instructions (Addendum)
Appointment with Dr. Cleda Mccreedy 10/25/17 2:45PM Address: [address], [place], [place] [address]  Phone: [phone] Td Vaccine (Tetanus and Diphtheria): What You Need to Know 1. Why get vaccinated? Tetanus  and diphtheria are very serious diseases. They are rare in the [place] today, but people who do become infected often have severe complications. Td vaccine is used to protect adolescents and adults from both of these diseases. Both tetanus and diphtheria are infections caused by bacteria. Diphtheria spreads from person to person through coughing or sneezing. Tetanus-causing bacteria enter the body through cuts, scratches, or wounds. TETANUS (lockjaw) causes painful muscle tightening and stiffness, usually all over the body.  It can lead to tightening of muscles in the head and neck so you can't open your mouth, swallow, or sometimes even breathe. Tetanus kills about 1 out of every 10 people who are infected even after receiving the best medical care.  DIPHTHERIA can cause a thick coating to form in the back of the throat.  It can lead to breathing problems, paralysis, heart failure, and death.  Before vaccines, as many as 200,000 cases of diphtheria and hundreds of cases of tetanus were reported in the [place] each year. Since vaccination began, reports of cases for both diseases have dropped by about 99%. 2. Td vaccine Td vaccine can protect adolescents and adults from tetanus and diphtheria. Td is usually given as a booster dose every 10 years but it can also be given earlier after a severe and dirty wound or burn. Another vaccine, called Tdap, which protects against pertussis in addition to tetanus and diphtheria, is sometimes recommended instead of Td vaccine. Your doctor or the person giving you the vaccine can give you more information. Td may safely be given at the same time as other vaccines. 3. Some people should not get this vaccine  A person who has ever had a  life-threatening allergic reaction after a previous dose of any tetanus or diphtheria containing vaccine, OR has a severe allergy to any part of this vaccine, should not get Td vaccine. Tell the person giving the vaccine about any severe allergies.  Talk to your doctor if you: ? had severe pain or swelling after any vaccine containing diphtheria or tetanus, ? ever had a condition called Guillain Barre Syndrome (GBS), ? aren't feeling well on the day the shot is scheduled. 4. What are the risks from Td vaccine? With any medicine, including vaccines, there is a chance of side effects. These are usually mild and go away on their own. Serious reactions are also possible but are rare. Most people who get Td vaccine do not have any problems with it. Mild problems following Td vaccine: (Did not interfere with activities)  Pain where the shot was given (about 8 people in 10)  Redness or swelling where the shot was given (about 1 person in 4)  Mild fever (rare)  Headache (about 1 person in 4)  Tiredness (about 1 person in 4)  Moderate problems following Td vaccine: (Interfered with activities, but did not require medical attention)  Fever over 102F (rare)  Severe problems following Td vaccine: (Unable to perform usual activities; required medical attention)  Swelling, severe pain, bleeding and/or redness in the arm where the shot was given (rare).  Problems that could happen after any vaccine:  People sometimes faint after a medical procedure, including vaccination. Sitting or lying down for about 15 minutes can help prevent fainting, and injuries caused by a fall. Tell your  doctor if you feel dizzy, or have vision changes or ringing in the ears.  Some people get severe pain in the shoulder and have difficulty moving the arm where a shot was given. This happens very rarely.  Any medication can cause a severe allergic reaction. Such reactions from a vaccine are very rare, estimated at  fewer than 1 in a million doses, and would happen within a few minutes to a few hours after the vaccination. As with any medicine, there is a very remote chance of a vaccine causing a serious injury or death. The safety of vaccines is always being monitored. For more information, visit: http://www.aguilar.org/ 5. What if there is a serious reaction? What should I look for? Look for anything that concerns you, such as signs of a severe allergic reaction, very high fever, or unusual behavior. Signs of a severe allergic reaction can include hives, swelling of the face and throat, difficulty breathing, a fast heartbeat, dizziness, and weakness. These would usually start a few minutes to a few hours after the vaccination. What should I do?  If you think it is a severe allergic reaction or other emergency that can't wait, call 9-1-1 or get the person to the nearest hospital. Otherwise, call your doctor.  Afterward, the reaction should be reported to the Vaccine Adverse Event Reporting System (VAERS). Your doctor might file this report, or you can do it yourself through the VAERS web site at www.vaers.SamedayNews.es, or by calling (678)037-8556. ? VAERS does not give medical advice. 6. The National Vaccine Injury Compensation Program The [employer] Vaccine Injury Compensation Program (VICP) is a federal program that was created to compensate people who may have been injured by certain vaccines. Persons who believe they may have been injured by a vaccine can learn about the program and about filing a claim by calling 959-229-5072 or visiting the [place] website at GoldCloset.com.ee. There is a time limit to file a claim for compensation. 7. How can I learn more?  Ask your doctor. He or she can give you the vaccine package insert or suggest other sources of information.  Call your local or state health department.  Contact the Centers for Disease Control and Prevention (CDC): ? Call  985-843-8493 (1-800-CDC-INFO) ? Visit CDC's website at http://hunter.com/ CDC Td Vaccine VIS (02/25/16) This information is not intended to replace advice given to you by your health care provider. Make sure you discuss any questions you have with your health care provider. Document Released: 08/30/2006 Document Revised: 07/23/2016 Document Reviewed: 07/23/2016 Elsevier Interactive Patient Education  2017 [place]. Influenza (Flu) Vaccine (Inactivated or Recombinant): What You Need to Know 1. Why get vaccinated? Influenza ("flu") is a contagious disease that spreads around the [place] every year, usually between October and May. Flu is caused by influenza viruses, and is spread mainly by coughing, sneezing, and close contact. Anyone can get flu. Flu strikes suddenly and can last several days. Symptoms vary by age, but can include:  fever/chills  sore throat  muscle aches  fatigue  cough  headache  runny or stuffy nose  Flu can also lead to pneumonia and blood infections, and cause diarrhea and seizures in children. If you have a medical condition, such as heart or lung disease, flu can make it worse. Flu is more dangerous for some people. Infants and young children, people 22 years of age and older, pregnant women, and people with certain health conditions or a weakened immune system are at greatest risk. Each year thousands  of people in the [place] die from flu, and many more are hospitalized. Flu vaccine can:  keep you from getting flu,  make flu less severe if you do get it, and  keep you from spreading flu to your family and other people. 2. Inactivated and recombinant flu vaccines A dose of flu vaccine is recommended every flu season. Children 6 months through 50 years of age may need two doses during the same flu season. Everyone else needs only one dose each flu season. Some inactivated flu vaccines contain a very small amount of a mercury-based  preservative called thimerosal. Studies have not shown thimerosal in vaccines to be harmful, but flu vaccines that do not contain thimerosal are available. There is no live flu virus in flu shots. They cannot cause the flu. There are many flu viruses, and they are always changing. Each year a new flu vaccine is made to protect against three or four viruses that are likely to cause disease in the upcoming flu season. But even when the vaccine doesn't exactly match these viruses, it may still provide some protection. Flu vaccine cannot prevent:  flu that is caused by a virus not covered by the vaccine, or  illnesses that look like flu but are not.  It takes about 2 weeks for protection to develop after vaccination, and protection lasts through the flu season. 3. Some people should not get this vaccine Tell the person who is giving you the vaccine:  If you have any severe, life-threatening allergies. If you ever had a life-threatening allergic reaction after a dose of flu vaccine, or have a severe allergy to any part of this vaccine, you may be advised not to get vaccinated. Most, but not all, types of flu vaccine contain a small amount of egg protein.  If you ever had Guillain-Barr Syndrome (also called GBS). Some people with a history of GBS should not get this vaccine. This should be discussed with your doctor.  If you are not feeling well. It is usually okay to get flu vaccine when you have a mild illness, but you might be asked to come back when you feel better.  4. Risks of a vaccine reaction With any medicine, including vaccines, there is a chance of reactions. These are usually mild and go away on their own, but serious reactions are also possible. Most people who get a flu shot do not have any problems with it. Minor problems following a flu shot include:  soreness, redness, or swelling where the shot was given  hoarseness  sore, red or itchy  eyes  cough  fever  aches  headache  itching  fatigue  If these problems occur, they usually begin soon after the shot and last 1 or 2 days. More serious problems following a flu shot can include the following:  There may be a small increased risk of Guillain-Barre Syndrome (GBS) after inactivated flu vaccine. This risk has been estimated at 1 or 2 additional cases per million people vaccinated. This is much lower than the risk of severe complications from flu, which can be prevented by flu vaccine.  Young children who get the flu shot along with pneumococcal vaccine (PCV13) and/or DTaP vaccine at the same time might be slightly more likely to have a seizure caused by fever. Ask your doctor for more information. Tell your doctor if a child who is getting flu vaccine has ever had a seizure.  Problems that could happen after any injected vaccine:  People sometimes faint after a medical procedure, including vaccination. Sitting or lying down for about 15 minutes can help prevent fainting, and injuries caused by a fall. Tell your doctor if you feel dizzy, or have vision changes or ringing in the ears.  Some people get severe pain in the shoulder and have difficulty moving the arm where a shot was given. This happens very rarely.  Any medication can cause a severe allergic reaction. Such reactions from a vaccine are very rare, estimated at about 1 in a million doses, and would happen within a few minutes to a few hours after the vaccination. As with any medicine, there is a very remote chance of a vaccine causing a serious injury or death. The safety of vaccines is always being monitored. For more information, visit: http://www.aguilar.org/ 5. What if there is a serious reaction? What should I look for? Look for anything that concerns you, such as signs of a severe allergic reaction, very high fever, or unusual behavior. Signs of a severe allergic reaction can include hives, swelling  of the face and throat, difficulty breathing, a fast heartbeat, dizziness, and weakness. These would start a few minutes to a few hours after the vaccination. What should I do?  If you think it is a severe allergic reaction or other emergency that can't wait, call 9-1-1 and get the person to the nearest hospital. Otherwise, call your doctor.  Reactions should be reported to the Vaccine Adverse Event Reporting System (VAERS). Your doctor should file this report, or you can do it yourself through the VAERS web site at www.vaers.SamedayNews.es, or by calling (551) 323-6740. ? VAERS does not give medical advice. 6. The National Vaccine Injury Compensation Program The [employer] Vaccine Injury Compensation Program (VICP) is a federal program that was created to compensate people who may have been injured by certain vaccines. Persons who believe they may have been injured by a vaccine can learn about the program and about filing a claim by calling 930-364-2811 or visiting the [place] website at GoldCloset.com.ee. There is a time limit to file a claim for compensation. 7. How can I learn more?  Ask your healthcare provider. He or she can give you the vaccine package insert or suggest other sources of information.  Call your local or state health department.  Contact the Centers for Disease Control and Prevention (CDC): ? Call (765)853-4239 (1-800-CDC-INFO) or ? Visit CDC's website at https://gibson.com/ Vaccine Information Statement, Inactivated Influenza Vaccine (06/22/2014) This information is not intended to replace advice given to you by your health care provider. Make sure you discuss any questions you have with your health care provider. Document Released: 08/27/2006 Document Revised: 07/23/2016 Document Reviewed: 07/23/2016 Elsevier Interactive Patient Education  2017 [employer].

## 2017-10-19 NOTE — Progress Notes (Signed)
BP (!) 158/76 (BP Location: Left Arm, Patient Position: Sitting, Cuff Size: Large)   Pulse 80   Temp 98.7 F (37.1 C)   SpO2 98%    Subjective:    Patient ID: Michelle Chandler, female    DOB: 1956/02/24, 61 y.o.   MRN: [id number]  HPI: Michelle Chandler is a 61 y.o. female  Chief Complaint  Patient presents with  . Foot Pain    right   SKIN INFECTION- has a corn on her foot- has been using corn pads with no benefit.  Duration: about a month Location: bottom of R foot History of trauma in area: no Pain: yes Quality: sharp occasionally, tooth ache Severity: 5/10 Redness: yes Swelling: yes Oozing: no Pus: no Fevers: no Nausea/vomiting: no Status: worse Treatments attempted:corn pads  Tetanus: Not UTD  Relevant past medical, surgical, family and social history reviewed and updated as indicated. Interim medical history since our last visit reviewed. Allergies and medications reviewed and updated.  Review of Systems  Constitutional: Negative.   Respiratory: Negative.   Cardiovascular: Negative.   Skin: Positive for color change and wound. Negative for pallor and rash.  Psychiatric/Behavioral: Negative.     Per HPI unless specifically indicated above     Objective:    BP (!) 158/76 (BP Location: Left Arm, Patient Position: Sitting, Cuff Size: Large)   Pulse 80   Temp 98.7 F (37.1 C)   SpO2 98%   Wt Readings from Last 3 Encounters:  06/29/17 157 lb (71.2 kg)  12/28/16 150 lb (68 kg)  09/20/16 155 lb (70.3 kg)    Physical Exam  Constitutional: She is oriented to person, place, and time. She appears well-developed and well-nourished. No distress.  HENT:  Head: Normocephalic and atraumatic.  Right Ear: Hearing normal.  Left Ear: Hearing normal.  Nose: Nose normal.  Eyes: Conjunctivae and lids are normal. Right eye exhibits no discharge. Left eye exhibits no discharge. No scleral icterus.  Pulmonary/Chest: Effort normal. No respiratory distress.  Musculoskeletal:  Normal range of motion.  Neurological: She is alert and oriented to person, place, and time.  Skin: Skin is warm, dry and intact. No rash noted. She is not diaphoretic. There is erythema. No pallor.  Colder feet- red and swollen, deformity of feet, hypertrophic calluses on ball and center of foot. Small wound near callus of great toe  Psychiatric: She has a normal mood and affect. Her speech is normal and behavior is normal. Judgment and thought content normal. Cognition and memory are normal.  Nursing note and vitals reviewed.   Results for orders placed or performed in visit on 53/29/92  Basic metabolic panel  Result Value Ref Range   Glucose 97 65 - 99 mg/dL   BUN 12 6 - 24 mg/dL   Creatinine, Ser 0.63 0.57 - 1.00 mg/dL   GFR calc non Af Amer 98 >59 mL/min/1.73   GFR calc Af Amer 114 >59 mL/min/1.73   BUN/Creatinine Ratio 19 9 - 23   Sodium 137 134 - 144 mmol/L   Potassium 4.6 3.5 - 5.2 mmol/L   Chloride 100 96 - 106 mmol/L   CO2 25 20 - 29 mmol/L   Calcium 9.8 8.7 - 10.2 mg/dL  LP+ALT+AST Piccolo, Waived  Result Value Ref Range   ALT (SGPT) Piccolo, Waived 18 10 - 47 U/L   AST (SGOT) Piccolo, Waived 19 11 - 38 U/L   Cholesterol Piccolo, Waived 201 (H) <200 mg/dL   HDL Chol Piccolo, Waived 51 (L) >59  mg/dL   Triglycerides Piccolo,Waived 343 (H) <150 mg/dL   Chol/HDL Ratio Piccolo,Waive 3.9 mg/dL   LDL Chol Calc Piccolo Waived 81 <100 mg/dL   VLDL Chol Calc Piccolo,Waive 69 (H) <30 mg/dL      Assessment & Plan:   Problem List Items Addressed This Visit    None    Visit Diagnoses    Callus of foot    -  Primary   Referral to podiatry made today.   Relevant Orders   Ambulatory referral to Podiatry   Immunization due       Flu and Td given today.   Relevant Orders   Flu Vaccine QUAD 6+ mos PF IM (Fluarix Quad PF) (Completed)   Cellulitis of foot       Will treat with bactrim. Referral to podiatry made today.   Relevant Orders   Ambulatory referral to Podiatry   Open  wound of right foot, initial encounter       Referral to podiatry made. Td given today.   Relevant Orders   Td : Tetanus/diphtheria >7yo Preservative  free (Completed)       Follow up plan: Return ASAP, for follow up anxiety with PCP.

## 2017-10-20 ENCOUNTER — Ambulatory Visit: Payer: Medicare Other | Admitting: Family Medicine

## 2017-10-22 DIAGNOSIS — Q6689 Other  specified congenital deformities of feet: Secondary | ICD-10-CM | POA: Diagnosis not present

## 2017-10-22 DIAGNOSIS — B351 Tinea unguium: Secondary | ICD-10-CM | POA: Diagnosis not present

## 2017-10-22 DIAGNOSIS — M2580 Other specified joint disorders, unspecified joint: Secondary | ICD-10-CM | POA: Diagnosis not present

## 2017-10-22 DIAGNOSIS — M7751 Other enthesopathy of right foot: Secondary | ICD-10-CM | POA: Diagnosis not present

## 2017-10-22 DIAGNOSIS — M79674 Pain in right toe(s): Secondary | ICD-10-CM | POA: Diagnosis not present

## 2017-10-22 DIAGNOSIS — M79675 Pain in left toe(s): Secondary | ICD-10-CM | POA: Diagnosis not present

## 2017-11-11 ENCOUNTER — Encounter: Payer: Self-pay | Admitting: Family Medicine

## 2017-12-30 ENCOUNTER — Other Ambulatory Visit: Payer: Self-pay | Admitting: Family Medicine

## 2017-12-30 ENCOUNTER — Encounter: Payer: Medicare Other | Admitting: Family Medicine

## 2017-12-30 DIAGNOSIS — E7800 Pure hypercholesterolemia, unspecified: Secondary | ICD-10-CM

## 2018-01-28 ENCOUNTER — Telehealth: Payer: Self-pay | Admitting: Family Medicine

## 2018-01-28 ENCOUNTER — Ambulatory Visit (INDEPENDENT_AMBULATORY_CARE_PROVIDER_SITE_OTHER): Payer: Medicare Other

## 2018-01-28 VITALS — BP 138/84 | HR 83 | Temp 98.1°F | Resp 16 | Ht 62.0 in | Wt 162.4 lb

## 2018-01-28 DIAGNOSIS — E785 Hyperlipidemia, unspecified: Secondary | ICD-10-CM

## 2018-01-28 DIAGNOSIS — N390 Urinary tract infection, site not specified: Secondary | ICD-10-CM

## 2018-01-28 DIAGNOSIS — Z1329 Encounter for screening for other suspected endocrine disorder: Secondary | ICD-10-CM | POA: Diagnosis not present

## 2018-01-28 DIAGNOSIS — I10 Essential (primary) hypertension: Secondary | ICD-10-CM

## 2018-01-28 DIAGNOSIS — R5383 Other fatigue: Secondary | ICD-10-CM

## 2018-01-28 DIAGNOSIS — Z0000 Encounter for general adult medical examination without abnormal findings: Secondary | ICD-10-CM

## 2018-01-28 DIAGNOSIS — Z1211 Encounter for screening for malignant neoplasm of colon: Secondary | ICD-10-CM

## 2018-01-28 LAB — MICROSCOPIC EXAMINATION: WBC, UA: >30 /hpf — AB (ref 0–?)

## 2018-01-28 LAB — URINALYSIS, ROUTINE W REFLEX MICROSCOPIC
Bilirubin, UA: NEGATIVE
Glucose, UA: NEGATIVE
Ketones, UA: NEGATIVE
NITRITE UA: POSITIVE — AB
PH UA: 7.5 (ref 5.0–7.5)
Specific Gravity, UA: 1.02 (ref 1.005–1.030)
UUROB: 0.2 mg/dL (ref 0.2–1.0)

## 2018-01-28 MED ORDER — NITROFURANTOIN MONOHYD MACRO 100 MG PO CAPS: 100.0000 mg | ORAL_CAPSULE | Freq: Two times a day (BID) | ORAL | 0 refills | Status: DC

## 2018-01-28 NOTE — Telephone Encounter (Signed)
Called patient with results of urine, looks like a UTI. Not having any symptoms. Will send abx. Await culture.

## 2018-01-28 NOTE — Addendum Note (Signed)
Addended by: Tyler Aas A on: 01/28/2018 11:17 AM   Modules accepted: Orders

## 2018-01-28 NOTE — Progress Notes (Signed)
Subjective:   Michelle Chandler is a 62 y.o. female who presents for Medicare Annual (Subsequent) preventive examination.  Review of Systems:  Cardiac Risk Factors include: hypertension;dyslipidemia;smoking/ tobacco exposure     Objective:     Vitals: BP 138/84 (BP Location: Right Arm, Patient Position: Sitting)   Pulse 83   Temp 98.1 F (36.7 C) (Temporal)   Resp 16   Ht 5\' 2"  (1.575 m)   Wt 162 lb 6.4 oz (73.7 kg)   BMI 29.70 kg/m   Body mass index is 29.7 kg/m.  Advanced Directives 01/28/2018 09/20/2016  Does Patient Have a Medical Advance Directive? Yes;No No  Does patient want to make changes to medical advance directive? Yes (MAU/Ambulatory/Procedural Areas - Information given) -  Would patient like information on creating a medical advance directive? - No - patient declined information    Tobacco Social History   Tobacco Use  Smoking Status Current Every Day Smoker  . Packs/day: 0.25  . Types: Cigarettes  Smokeless Tobacco Never Used     Ready to quit: No Counseling given: No   Clinical Intake:  Pre-visit preparation completed: No  Pain : No/denies pain     Diabetes: No  How often do you need to have someone help you when you read instructions, pamphlets, or other written materials from your doctor or pharmacy?: 5 - Always(understanding ) What is the last grade level you completed in school?: 12th grade  Interpreter Needed?: No  Information entered by :: Tiffany Hill,LPN   Past Medical History:  Diagnosis Date  . Allergic rhinitis   . Ataxia   . CP (cerebral palsy) ([place])   . Depression   . Diverticulitis   . Diverticulosis   . Hyperlipidemia   . Hypertension   . IFG (impaired fasting glucose)   . Venous insufficiency   . Venous stasis ulcer ([place])   . Vertigo    Past Surgical History:  Procedure Laterality Date  . ABDOMINAL HYSTERECTOMY     complete  . LAPAROSCOPY    . legs and hip     due to CP   Family History  Problem Relation Age  of Onset  . Heart disease Maternal Uncle 46  . Kidney disease Maternal Uncle    Social History   Socioeconomic History  . Marital status: Single    Spouse name: None  . Number of children: None  . Years of education: None  . Highest education level: None  Social Needs  . Financial resource strain: Not hard at all  . Food insecurity - worry: Never true  . Food insecurity - inability: Never true  . Transportation needs - medical: No  . Transportation needs - non-medical: No  Occupational History  . None  Tobacco Use  . Smoking status: Current Every Day Smoker    Packs/day: 0.25    Types: Cigarettes  . Smokeless tobacco: Never Used  Substance and Sexual Activity  . Alcohol use: No    Alcohol/week: 0.0 oz  . Drug use: No  . Sexual activity: No  Other Topics Concern  . None  Social History Narrative  . None    Outpatient Encounter Medications as of 01/28/2018  Medication Sig  . aspirin EC 81 MG tablet Take 81 mg by mouth daily.  . baclofen (LIORESAL) 10 MG tablet Take 1 tablet (10 mg total) by mouth daily.  . cyclobenzaprine (FLEXERIL) 5 MG tablet Take 1 tablet (5 mg total) by mouth 3 (three) times daily as needed for  muscle spasms.  [name] FLUoxetine (PROZAC) 20 MG capsule Take 1 capsule (20 mg total) by mouth daily.  . hydrochlorothiazide (HYDRODIURIL) 12.5 MG tablet Take 1 tablet (12.5 mg total) by mouth daily.  . Incontinence Supply Disposable (ENTRUST PLUS DISP UNDERPADS) MISC by Does not apply route.  . Incontinence Supply Disposable (PROTECTIVE UNDERWEAR LARGE) MISC by Does not apply route.  [name] LORazepam (ATIVAN) 1 MG tablet Take 1 tablet (1 mg total) by mouth daily as needed for anxiety.  . meloxicam (MOBIC) 15 MG tablet Take 1 tablet (15 mg total) by mouth daily.  . simvastatin (ZOCOR) 20 MG tablet Take 1 tablet (20 mg total) by mouth daily.  . [DISCONTINUED] sulfamethoxazole-trimethoprim (BACTRIM DS,SEPTRA DS) 800-160 MG tablet Take 1 tablet by mouth 2 (two) times daily.  (Patient not taking: Reported on 01/28/2018)   No facility-administered encounter medications on file as of 01/28/2018.     Activities of Daily Living In your present state of health, do you have any difficulty performing the following activities: 01/28/2018  Hearing? N  Vision? N  Difficulty concentrating or making decisions? N  Walking or climbing stairs? Y  Comment doesnt take stairs  Dressing or bathing? Y  Comment has assistance  Doing errands, shopping? Y  Comment has Agricultural engineer and eating ? N  Using the Toilet? N  In the past six months, have you accidently leaked urine? Y  Comment wears pull up   Do you have problems with loss of bowel control? N  Managing your Medications? N  Managing your Finances? N  Housekeeping or managing your Housekeeping? Y  Comment lives with mother  Some recent data might be hidden    Patient Care Team: Guadalupe Maple, MD as PCP - General (Family Medicine)    Assessment:   This is a routine wellness examination for Camilia.  Exercise Activities and Dietary recommendations Current Exercise Habits: The patient does not participate in regular exercise at present, Exercise limited by: neurologic condition(s)  Goals    . Quit Smoking     Smoking cessation discussed- not ready to quit smoking        Fall Risk Fall Risk  01/28/2018 06/29/2017 12/28/2016  Falls in the past year? Yes No Yes  Number falls in past yr: 2 or more - 1  Injury with Fall? No - Yes  Risk Factor Category  High Fall Risk - High Fall Risk  Risk for fall due to : Impaired balance/gait - -  Follow up Falls prevention discussed - -   Is the patient's home free of loose throw rugs in walkways, pet beds, electrical cords, etc?   yes      Grab bars in the bathroom? yes      Handrails on the stairs?   no      Adequate lighting?   yes  Timed Get Up and Go performed: in wheelchair, didn't perform get up and go test today. Patient is unsteady without walker.     Depression Screen PHQ 2/9 Scores 01/28/2018 06/29/2017 12/28/2016  PHQ - 2 Score 2 0 1  PHQ- 9 Score 7 - -     Cognitive Function     6CIT Screen 01/28/2018  What Year? 0 points  What month? 0 points  What time? 0 points  Count back from 20 0 points  Months in reverse 0 points  Repeat phrase 4 points  Total Score 4    Immunization History  Administered Date(s) Administered  . Influenza,inj,Quad  PF,6+ Mos 10/19/2017  . Influenza-Unspecified 11/14/2014, 09/14/2015, 10/05/2016  . Td 08/30/2007, 10/19/2017    Qualifies for Shingles Vaccine? Yes, discussed shingrix vaccine   Screening Tests Health Maintenance  Topic Date Due  . COLONOSCOPY  02/27/2008  . MAMMOGRAM  11/21/2016  . TETANUS/TDAP  10/20/2027  . INFLUENZA VACCINE  Completed  . Hepatitis C Screening  Completed  . HIV Screening  Completed    Cancer Screenings: Lung: Low Dose CT Chest recommended if Age 62-80 years, 30 pack-year currently smoking OR have quit w/in 15years. Patient does qualify.- declined Breast:  Up to date on Mammogram? No  - order in system Up to date of Bone Density/Dexa? Due at age 4 Colorectal: cologuard ordered  Additional Screenings:  Hepatitis B/HIV/Syphillis: completed 12/28/2016 Hepatitis C Screening:  Completed 11/20/2015     Plan:    I have personally reviewed and addressed the Medicare Annual Wellness questionnaire and have noted the following in the patient's chart:  A. Medical and social history B. Use of alcohol, tobacco or illicit drugs  C. Current medications and supplements D. Functional ability and status E.  Nutritional status F.  Physical activity G. Advance directives H. List of other physicians I.  Hospitalizations, surgeries, and ER visits in previous 12 months J.  [place] such as hearing and vision if needed, cognitive and depression L. Referrals and appointments   In addition, I have reviewed and discussed with patient certain preventive  protocols, quality metrics, and best practice recommendations. A written personalized care plan for preventive services as well as general preventive health recommendations were provided to patient.   Signed,  Tyler Aas, LPN Nurse Health Advisor   Nurse Notes:none

## 2018-01-28 NOTE — Patient Instructions (Addendum)
Michelle Chandler , Thank you for taking time to come for your Medicare Wellness Visit. I appreciate your ongoing commitment to your health goals. Please review the following plan we discussed and let me know if I can assist you in the future.   Screening recommendations/referrals: Colonoscopy: due now- cologuard ordered Mammogram: Please call [phone] to schedule your mammogram.  Bone Density: due at age 60 Recommended yearly ophthalmology/optometry visit for glaucoma screening and checkup Recommended yearly dental visit for hygiene and checkup  Vaccinations: Influenza vaccine: up to date Pneumococcal vaccine: due at age 9 Tdap vaccine: up to date Shingles vaccine: eligible, check with your insurance company for coverage     Advanced directives: Advance directive discussed with you today. I have provided a copy for you to complete at home and have notarized. Once this is complete please bring a copy in to our office so we can scan it into your chart.  Conditions/risks identified: Smoking cessation discussed- not ready to quit smoking at this time.   Next appointment: Follow up on 02/02/2018 at 2;00pm with Dr.Crissman. Follow up in one year for your annual wellness exam.   Preventive Care 40-64 Years, Female Preventive care refers to lifestyle choices and visits with your health care provider that can promote health and wellness. What does preventive care include?  A yearly physical exam. This is also called an annual well check.  Dental exams once or twice a year.  Routine eye exams. Ask your health care provider how often you should have your eyes checked.  Personal lifestyle choices, including:  Daily care of your teeth and gums.  Regular physical activity.  Eating a healthy diet.  Avoiding tobacco and drug use.  Limiting alcohol use.  Practicing safe sex.  Taking low-dose aspirin daily starting at age 71.  Taking vitamin and mineral supplements as recommended by your  health care provider. What happens during an annual well check? The services and screenings done by your health care provider during your annual well check will depend on your age, overall health, lifestyle risk factors, and family history of disease. Counseling  Your health care provider may ask you questions about your:  Alcohol use.  Tobacco use.  Drug use.  Emotional well-being.  Home and relationship well-being.  Sexual activity.  Eating habits.  Work and work Statistician.  Method of birth control.  Menstrual cycle.  Pregnancy history. Screening  You may have the following tests or measurements:  Height, weight, and BMI.  Blood pressure.  Lipid and cholesterol levels. These may be checked every 5 years, or more frequently if you are over 79 years old.  Skin check.  Lung cancer screening. You may have this screening every year starting at age 20 if you have a 30-pack-year history of smoking and currently smoke or have quit within the past 15 years.  Fecal occult blood test (FOBT) of the stool. You may have this test every year starting at age 72.  Flexible sigmoidoscopy or colonoscopy. You may have a sigmoidoscopy every 5 years or a colonoscopy every 10 years starting at age 35.  Hepatitis C blood test.  Hepatitis B blood test.  Sexually transmitted disease (STD) testing.  Diabetes screening. This is done by checking your blood sugar (glucose) after you have not eaten for a while (fasting). You may have this done every 1-3 years.  Mammogram. This may be done every 1-2 years. Talk to your health care provider about when you should start having regular mammograms. This may  depend on whether you have a family history of breast cancer.  BRCA-related cancer screening. This may be done if you have a family history of breast, ovarian, tubal, or peritoneal cancers.  Pelvic exam and Pap test. This may be done every 3 years starting at age 59. Starting at age 54,  this may be done every 5 years if you have a Pap test in combination with an HPV test.  Bone density scan. This is done to screen for osteoporosis. You may have this scan if you are at high risk for osteoporosis. Discuss your test results, treatment options, and if necessary, the need for more tests with your health care provider. Vaccines  Your health care provider may recommend certain vaccines, such as:  Influenza vaccine. This is recommended every year.  Tetanus, diphtheria, and acellular pertussis (Tdap, Td) vaccine. You may need a Td booster every 10 years.  Zoster vaccine. You may need this after age 39.  Pneumococcal 13-valent conjugate (PCV13) vaccine. You may need this if you have certain conditions and were not previously vaccinated.  Pneumococcal polysaccharide (PPSV23) vaccine. You may need one or two doses if you smoke cigarettes or if you have certain conditions. Talk to your health care provider about which screenings and vaccines you need and how often you need them. This information is not intended to replace advice given to you by your health care provider. Make sure you discuss any questions you have with your health care provider. Document Released: 11/29/2015 Document Revised: 07/22/2016 Document Reviewed: 09/03/2015 Elsevier Interactive Patient Education  2017 [place] Prevention in the Home Falls can cause injuries. They can happen to people of all ages. There are many things you can do to make your home safe and to help prevent falls. What can I do on the outside of my home?  Regularly fix the edges of walkways and driveways and fix any cracks.  Remove anything that might make you trip as you walk through a door, such as a raised step or threshold.  Trim any bushes or trees on the path to your home.  Use bright outdoor lighting.  Clear any walking paths of anything that might make someone trip, such as rocks or tools.  Regularly check to see  if handrails are loose or broken. Make sure that both sides of any steps have handrails.  Any raised decks and porches should have guardrails on the edges.  Have any leaves, snow, or ice cleared regularly.  Use sand or salt on walking paths during winter.  Clean up any spills in your garage right away. This includes oil or grease spills. What can I do in the bathroom?  Use night lights.  Install grab bars by the toilet and in the tub and shower. Do not use towel bars as grab bars.  Use non-skid mats or decals in the tub or shower.  If you need to sit down in the shower, use a plastic, non-slip stool.  Keep the floor dry. Clean up any water that spills on the floor as soon as it happens.  Remove soap buildup in the tub or shower regularly.  Attach bath mats securely with double-sided non-slip rug tape.  Do not have throw rugs and other things on the floor that can make you trip. What can I do in the bedroom?  Use night lights.  Make sure that you have a light by your bed that is easy to reach.  Do not use  any sheets or blankets that are too big for your bed. They should not hang down onto the floor.  Have a firm chair that has side arms. You can use this for support while you get dressed.  Do not have throw rugs and other things on the floor that can make you trip. What can I do in the kitchen?  Clean up any spills right away.  Avoid walking on wet floors.  Keep items that you use a lot in easy-to-reach places.  If you need to reach something above you, use a strong step stool that has a grab bar.  Keep electrical cords out of the way.  Do not use floor polish or wax that makes floors slippery. If you must use wax, use non-skid floor wax.  Do not have throw rugs and other things on the floor that can make you trip. What can I do with my stairs?  Do not leave any items on the stairs.  Make sure that there are handrails on both sides of the stairs and use them.  Fix handrails that are broken or loose. Make sure that handrails are as long as the stairways.  Check any carpeting to make sure that it is firmly attached to the stairs. Fix any carpet that is loose or worn.  Avoid having throw rugs at the top or bottom of the stairs. If you do have throw rugs, attach them to the floor with carpet tape.  Make sure that you have a light switch at the top of the stairs and the bottom of the stairs. If you do not have them, ask someone to add them for you. What else can I do to help prevent falls?  Wear shoes that:  Do not have high heels.  Have rubber bottoms.  Are comfortable and fit you well.  Are closed at the toe. Do not wear sandals.  If you use a stepladder:  Make sure that it is fully opened. Do not climb a closed stepladder.  Make sure that both sides of the stepladder are locked into place.  Ask someone to hold it for you, if possible.  Clearly mark and make sure that you can see:  Any grab bars or handrails.  First and last steps.  Where the edge of each step is.  Use tools that help you move around (mobility aids) if they are needed. These include:  Canes.  Walkers.  Scooters.  Crutches.  Turn on the lights when you go into a dark area. Replace any light bulbs as soon as they burn out.  Set up your furniture so you have a clear path. Avoid moving your furniture around.  If any of your floors are uneven, fix them.  If there are any pets around you, be aware of where they are.  Review your medicines with your doctor. Some medicines can make you feel dizzy. This can increase your chance of falling. Ask your doctor what other things that you can do to help prevent falls. This information is not intended to replace advice given to you by your health care provider. Make sure you discuss any questions you have with your health care provider. Document Released: 08/29/2009 Document Revised: 04/09/2016 Document Reviewed:  12/07/2014 Elsevier Interactive Patient Education  2017 [employer].

## 2018-01-29 LAB — COMPREHENSIVE METABOLIC PANEL
A/G RATIO: 1.6 (ref 1.2–2.2)
ALBUMIN: 4.3 g/dL (ref 3.5–5.5)
ALK PHOS: 108 IU/L (ref 39–117)
ALT: 15 IU/L (ref 0–32)
AST: 14 IU/L (ref 0–40)
BILIRUBIN TOTAL: 0.2 mg/dL (ref 0.0–1.2)
BUN / CREAT RATIO: 18 (ref 9–23)
BUN: 14 mg/dL (ref 6–24)
CHLORIDE: 99 mmol/L (ref 96–106)
CO2: 26 mmol/L (ref 20–29)
Calcium: 9.5 mg/dL (ref 8.7–10.2)
Creatinine, Ser: 0.77 mg/dL (ref 0.57–1.00)
GFR calc Af Amer: 98 mL/min/{1.73_m2} (ref >59)
GFR calc non Af Amer: 85 mL/min/{1.73_m2} (ref >59)
GLUCOSE: 103 mg/dL — AB (ref 65–99)
Globulin, Total: 2.7 g/dL (ref 1.5–4.5)
POTASSIUM: 4.3 mmol/L (ref 3.5–5.2)
Sodium: 139 mmol/L (ref 134–144)
Total Protein: 7 g/dL (ref 6.0–8.5)

## 2018-01-29 LAB — CBC WITH DIFFERENTIAL/PLATELET
BASOS ABS: 0 10*3/uL (ref 0.0–0.2)
Basos: 0 %
EOS (ABSOLUTE): 0.2 10*3/uL (ref 0.0–0.4)
Eos: 3 %
HEMOGLOBIN: 15.3 g/dL (ref 11.1–15.9)
Hematocrit: 46.4 % (ref 34.0–46.6)
Immature Grans (Abs): 0 10*3/uL (ref 0.0–0.1)
Immature Granulocytes: 0 %
LYMPHS ABS: 1.9 10*3/uL (ref 0.7–3.1)
Lymphs: 28 %
MCH: 32.3 pg (ref 26.6–33.0)
MCHC: 33 g/dL (ref 31.5–35.7)
MCV: 98 fL — AB (ref 79–97)
Monocytes Absolute: 0.5 10*3/uL (ref 0.1–0.9)
Monocytes: 8 %
NEUTROS ABS: 4.3 10*3/uL (ref 1.4–7.0)
Neutrophils: 61 %
PLATELETS: 332 10*3/uL (ref 150–379)
RBC: 4.74 x10E6/uL (ref 3.77–5.28)
RDW: 12.9 % (ref 12.3–15.4)
WBC: 7 10*3/uL (ref 3.4–10.8)

## 2018-01-29 LAB — LIPID PANEL W/O CHOL/HDL RATIO
Cholesterol, Total: 196 mg/dL (ref 100–199)
HDL: 48 mg/dL (ref >39)
LDL Calculated: 121 mg/dL — ABNORMAL HIGH (ref 0–99)
Triglycerides: 133 mg/dL (ref 0–149)
VLDL Cholesterol Cal: 27 mg/dL (ref 5–40)

## 2018-01-29 LAB — TSH: TSH: 1.58 u[IU]/mL (ref 0.450–4.500)

## 2018-01-30 LAB — URINE CULTURE

## 2018-01-31 ENCOUNTER — Telehealth: Payer: Self-pay | Admitting: Family Medicine

## 2018-01-31 ENCOUNTER — Encounter: Payer: Self-pay | Admitting: Family Medicine

## 2018-01-31 DIAGNOSIS — N3000 Acute cystitis without hematuria: Secondary | ICD-10-CM

## 2018-01-31 NOTE — Telephone Encounter (Signed)
Please let her know that her antibiotic should treat her UTI, and Dr. Jeananne Rama will likely repeat it to make sure it's gone when she comes in for her appointment. Thanks!

## 2018-01-31 NOTE — Telephone Encounter (Signed)
Message relayed to patient. Verbalized understanding and denied questions.   

## 2018-02-02 ENCOUNTER — Ambulatory Visit (INDEPENDENT_AMBULATORY_CARE_PROVIDER_SITE_OTHER): Payer: Medicare Other | Admitting: Family Medicine

## 2018-02-02 ENCOUNTER — Encounter: Payer: Self-pay | Admitting: Family Medicine

## 2018-02-02 VITALS — BP 130/83 | HR 48 | Ht 62.0 in | Wt 162.0 lb

## 2018-02-02 DIAGNOSIS — Z1231 Encounter for screening mammogram for malignant neoplasm of breast: Secondary | ICD-10-CM | POA: Diagnosis not present

## 2018-02-02 DIAGNOSIS — Z1239 Encounter for other screening for malignant neoplasm of breast: Secondary | ICD-10-CM

## 2018-02-02 DIAGNOSIS — I10 Essential (primary) hypertension: Secondary | ICD-10-CM | POA: Diagnosis not present

## 2018-02-02 DIAGNOSIS — E785 Hyperlipidemia, unspecified: Secondary | ICD-10-CM | POA: Diagnosis not present

## 2018-02-02 DIAGNOSIS — Z1329 Encounter for screening for other suspected endocrine disorder: Secondary | ICD-10-CM | POA: Diagnosis not present

## 2018-02-02 DIAGNOSIS — Z0000 Encounter for general adult medical examination without abnormal findings: Secondary | ICD-10-CM | POA: Diagnosis not present

## 2018-02-02 DIAGNOSIS — E7800 Pure hypercholesterolemia, unspecified: Secondary | ICD-10-CM

## 2018-02-02 MED ORDER — SIMVASTATIN 20 MG PO TABS: 20.0000 mg | ORAL_TABLET | Freq: Every day | ORAL | 4 refills | Status: DC

## 2018-02-02 MED ORDER — FLUOXETINE HCL 20 MG PO CAPS: 20.0000 mg | ORAL_CAPSULE | Freq: Every day | ORAL | 4 refills | Status: DC

## 2018-02-02 MED ORDER — CYCLOBENZAPRINE HCL 5 MG PO TABS: 5.0000 mg | ORAL_TABLET | Freq: Three times a day (TID) | ORAL | 4 refills | Status: DC | PRN

## 2018-02-02 MED ORDER — MELOXICAM 15 MG PO TABS: 15.0000 mg | ORAL_TABLET | Freq: Every day | ORAL | 1 refills | Status: DC

## 2018-02-02 MED ORDER — BACLOFEN 10 MG PO TABS: 10.0000 mg | ORAL_TABLET | Freq: Every day | ORAL | 4 refills | Status: DC

## 2018-02-02 MED ORDER — HYDROCHLOROTHIAZIDE 12.5 MG PO TABS: 12.5000 mg | ORAL_TABLET | Freq: Every day | ORAL | 4 refills | Status: DC

## 2018-02-02 MED ORDER — LORAZEPAM 1 MG PO TABS: 1.0000 mg | ORAL_TABLET | Freq: Every day | ORAL | 1 refills | Status: DC | PRN

## 2018-02-02 NOTE — Progress Notes (Signed)
BP 130/83   Pulse (!) 48   Ht '5\' 2"'  (1.575 m)   Wt 162 lb (73.5 kg)   SpO2 97%   BMI 29.63 kg/m    Subjective:    Patient ID: Michelle Chandler, female    DOB: 1956-09-09, 62 y.o.   MRN: [id number]  HPI: Michelle Chandler is a 62 y.o. female  Chief Complaint  Patient presents with  . Annual Exam  Patient all in all doing well with no complaints then rolling wheelchair for CP.  CP is stable not see a neurologist takes muscle relaxers without problems. Blood pressure stable with medication no complaints or issues. Cholesterol also stable with medication no complaints or issues. Depression stable on medication. Chronic anxiety stable on lorazepam which she takes every day is concerned as her mother is getting ready for knee surgery and will require a lot of care.  Relevant past medical, surgical, family and social history reviewed and updated as indicated. Interim medical history since our last visit reviewed. Allergies and medications reviewed and updated.  Review of Systems  Constitutional: Negative.   HENT: Negative.   Eyes: Negative.   Respiratory: Negative.   Cardiovascular: Negative.   Gastrointestinal: Negative.   Endocrine: Negative.   Genitourinary: Negative.   Musculoskeletal: Negative.   Skin: Negative.   Allergic/Immunologic: Negative.   Neurological: Negative.        MS changes  Hematological: Negative.   Psychiatric/Behavioral: Negative.     Per HPI unless specifically indicated above     Objective:    BP 130/83   Pulse (!) 48   Ht '5\' 2"'  (1.575 m)   Wt 162 lb (73.5 kg)   SpO2 97%   BMI 29.63 kg/m   Wt Readings from Last 3 Encounters:  02/02/18 162 lb (73.5 kg)  01/28/18 162 lb 6.4 oz (73.7 kg)  06/29/17 157 lb (71.2 kg)    Physical Exam  Constitutional: She is oriented to person, place, and time. She appears well-developed and well-nourished.  HENT:  Head: Normocephalic and atraumatic.  Right Ear: External ear normal.  Left Ear: External ear normal.   Nose: Nose normal.  Mouth/Throat: Oropharynx is clear and moist.  Eyes: Conjunctivae and EOM are normal. Pupils are equal, round, and reactive to light.  Neck: Normal range of motion. Neck supple. Carotid bruit is not present.  Cardiovascular: Normal rate, regular rhythm and normal heart sounds.  No murmur heard. Pulmonary/Chest: Effort normal and breath sounds normal. She exhibits no mass. Right breast exhibits no mass, no skin change and no tenderness. Left breast exhibits no mass, no skin change and no tenderness. Breasts are symmetrical.  Abdominal: Soft. Bowel sounds are normal. There is no hepatosplenomegaly.  Musculoskeletal: Normal range of motion.  MS changes  Neurological: She is alert and oriented to person, place, and time.  Skin: No rash noted.  Psychiatric: She has a normal mood and affect. Her behavior is normal. Judgment and thought content normal.    Results for orders placed or performed in visit on 01/28/18  Urine Culture  Result Value Ref Range   Urine Culture, Routine Final report (A)    Organism ID, Bacteria Escherichia coli (A)    Antimicrobial Susceptibility Comment   Microscopic Examination  Result Value Ref Range   WBC, UA >30 (A) 0 - 5 /hpf   RBC, UA 3-10 (A) 0 - 2 /hpf   Epithelial Cells (non renal) 0-10 0 - 10 /hpf   Bacteria, UA Moderate (A) None seen/Few  CBC with Differential  Result Value Ref Range   WBC 7.0 3.4 - 10.8 x10E3/uL   RBC 4.74 3.77 - 5.28 x10E6/uL   Hemoglobin 15.3 11.1 - 15.9 g/dL   Hematocrit 46.4 34.0 - 46.6 %   MCV 98 (H) 79 - 97 fL   MCH 32.3 26.6 - 33.0 pg   MCHC 33.0 31.5 - 35.7 g/dL   RDW 12.9 12.3 - 15.4 %   Platelets 332 150 - 379 x10E3/uL   Neutrophils 61 Not Estab. %   Lymphs 28 Not Estab. %   Monocytes 8 Not Estab. %   Eos 3 Not Estab. %   Basos 0 Not Estab. %   Neutrophils Absolute 4.3 1.4 - 7.0 x10E3/uL   Lymphocytes Absolute 1.9 0.7 - 3.1 x10E3/uL   Monocytes Absolute 0.5 0.1 - 0.9 x10E3/uL   EOS (ABSOLUTE)  0.2 0.0 - 0.4 x10E3/uL   Basophils Absolute 0.0 0.0 - 0.2 x10E3/uL   Immature Granulocytes 0 Not Estab. %   Immature Grans (Abs) 0.0 0.0 - 0.1 x10E3/uL  Comp Met (CMET)  Result Value Ref Range   Glucose 103 (H) 65 - 99 mg/dL   BUN 14 6 - 24 mg/dL   Creatinine, Ser 0.77 0.57 - 1.00 mg/dL   GFR calc non Af Amer 85 >59 mL/min/1.73   GFR calc Af Amer 98 >59 mL/min/1.73   BUN/Creatinine Ratio 18 9 - 23   Sodium 139 134 - 144 mmol/L   Potassium 4.3 3.5 - 5.2 mmol/L   Chloride 99 96 - 106 mmol/L   CO2 26 20 - 29 mmol/L   Calcium 9.5 8.7 - 10.2 mg/dL   Total Protein 7.0 6.0 - 8.5 g/dL   Albumin 4.3 3.5 - 5.5 g/dL   Globulin, Total 2.7 1.5 - 4.5 g/dL   Albumin/Globulin Ratio 1.6 1.2 - 2.2   Bilirubin Total 0.2 0.0 - 1.2 mg/dL   Alkaline Phosphatase 108 39 - 117 IU/L   AST 14 0 - 40 IU/L   ALT 15 0 - 32 IU/L  TSH  Result Value Ref Range   TSH 1.580 0.450 - 4.500 uIU/mL  Urinalysis, Routine w reflex microscopic  Result Value Ref Range   Specific Gravity, UA 1.020 1.005 - 1.030   pH, UA 7.5 5.0 - 7.5   Color, UA Straw Yellow   Appearance Ur Turbid (A) Clear   Leukocytes, UA 3+ (A) Negative   Protein, UA 2+ (A) Negative/Trace   Glucose, UA Negative Negative   Ketones, UA Negative Negative   RBC, UA 2+ (A) Negative   Bilirubin, UA Negative Negative   Urobilinogen, Ur 0.2 0.2 - 1.0 mg/dL   Nitrite, UA Positive (A) Negative   Microscopic Examination See below:   Lipid Panel w/o Chol/HDL Ratio  Result Value Ref Range   Cholesterol, Total 196 100 - 199 mg/dL   Triglycerides 133 0 - 149 mg/dL   HDL 48 >39 mg/dL   VLDL Cholesterol Cal 27 5 - 40 mg/dL   LDL Calculated 121 (H) 0 - 99 mg/dL      Assessment & Plan:   Problem List Items Addressed This Visit      Cardiovascular and Mediastinum   Hypertension - Primary   Relevant Medications   simvastatin (ZOCOR) 20 MG tablet   hydrochlorothiazide (HYDRODIURIL) 12.5 MG tablet     Other   Hyperlipidemia   Relevant Medications    simvastatin (ZOCOR) 20 MG tablet   hydrochlorothiazide (HYDRODIURIL) 12.5 MG tablet    Other  Visit Diagnoses    Thyroid disorder screen       Breast cancer screening           Follow up plan: Return in about 6 months (around 08/05/2018), or if symptoms worsen or fail to improve, for BMP,  Lipids, ALT, AST.

## 2018-08-16 ENCOUNTER — Encounter: Payer: Self-pay | Admitting: Family Medicine

## 2018-08-16 ENCOUNTER — Ambulatory Visit (INDEPENDENT_AMBULATORY_CARE_PROVIDER_SITE_OTHER): Payer: Medicare Other | Admitting: Family Medicine

## 2018-08-16 VITALS — BP 122/80 | HR 84 | Temp 98.4°F | Ht 62.0 in | Wt 160.8 lb

## 2018-08-16 DIAGNOSIS — I10 Essential (primary) hypertension: Secondary | ICD-10-CM | POA: Diagnosis not present

## 2018-08-16 DIAGNOSIS — G801 Spastic diplegic cerebral palsy: Secondary | ICD-10-CM | POA: Diagnosis not present

## 2018-08-16 DIAGNOSIS — Z23 Encounter for immunization: Secondary | ICD-10-CM

## 2018-08-16 DIAGNOSIS — Z1211 Encounter for screening for malignant neoplasm of colon: Secondary | ICD-10-CM | POA: Diagnosis not present

## 2018-08-16 DIAGNOSIS — E785 Hyperlipidemia, unspecified: Secondary | ICD-10-CM | POA: Diagnosis not present

## 2018-08-16 DIAGNOSIS — F325 Major depressive disorder, single episode, in full remission: Secondary | ICD-10-CM

## 2018-08-16 LAB — LP+ALT+AST PICCOLO, WAIVED
ALT (SGPT) Piccolo, Waived: 21 U/L (ref 10–47)
AST (SGOT) Piccolo, Waived: 20 U/L (ref 11–38)
Chol/HDL Ratio Piccolo,Waive: 4.1 mg/dL
Cholesterol Piccolo, Waived: 231 mg/dL — ABNORMAL HIGH (ref <200)
HDL Chol Piccolo, Waived: 56 mg/dL — ABNORMAL LOW (ref >59)
LDL CHOL CALC PICCOLO WAIVED: 122 mg/dL — AB (ref <100)
Triglycerides Piccolo,Waived: 266 mg/dL — ABNORMAL HIGH (ref <150)
VLDL CHOL CALC PICCOLO,WAIVE: 53 mg/dL — AB (ref <30)

## 2018-08-16 NOTE — Assessment & Plan Note (Signed)
Will start back meds today

## 2018-08-16 NOTE — Progress Notes (Signed)
BP 122/80   Pulse 84   Temp 98.4 F (36.9 C) (Oral)   Ht '5\' 2"'  (1.575 m)   Wt 160 lb 12.8 oz (72.9 kg)   SpO2 98%   BMI 29.41 kg/m    Subjective:    Patient ID: Michelle Chandler, female    DOB: 08/26/1956, 62 y.o.   MRN: [id number]  HPI: Michelle Chandler is a 62 y.o. female  Chief Complaint  Patient presents with  . Depression  . Hyperlipidemia  . Hypertension   Patient all in all doing well accompanied by caregiver takes simvastatin for cholesterol without problems has rare lorazepam use still has some left.  Fluoxetine for nerves along with baclofen is doing well for her muscle spasms. All in all doing just fine cholesterol pending has been out of her cholesterol medicine for a few days. Relevant past medical, surgical, family and social history reviewed and updated as indicated. Interim medical history since our last visit reviewed. Allergies and medications reviewed and updated.  Review of Systems  Constitutional: Negative.   Respiratory: Negative.   Cardiovascular: Negative.     Per HPI unless specifically indicated above     Objective:    BP 122/80   Pulse 84   Temp 98.4 F (36.9 C) (Oral)   Ht '5\' 2"'  (1.575 m)   Wt 160 lb 12.8 oz (72.9 kg)   SpO2 98%   BMI 29.41 kg/m   Wt Readings from Last 3 Encounters:  08/16/18 160 lb 12.8 oz (72.9 kg)  02/02/18 162 lb (73.5 kg)  01/28/18 162 lb 6.4 oz (73.7 kg)    Physical Exam  Constitutional: She is oriented to person, place, and time. She appears well-developed and well-nourished.  HENT:  Head: Normocephalic and atraumatic.  Eyes: Conjunctivae and EOM are normal.  Neck: Normal range of motion.  Cardiovascular: Normal rate, regular rhythm and normal heart sounds.  Pulmonary/Chest: Effort normal and breath sounds normal.  Musculoskeletal: Normal range of motion.  Neurological: She is alert and oriented to person, place, and time.  Skin: No erythema.  Psychiatric: She has a normal mood and affect. Her behavior is  normal. Judgment and thought content normal.    Results for orders placed or performed in visit on 01/28/18  Urine Culture  Result Value Ref Range   Urine Culture, Routine Final report (A)    Organism ID, Bacteria Escherichia coli (A)    Antimicrobial Susceptibility Comment   Microscopic Examination  Result Value Ref Range   WBC, UA >30 (A) 0 - 5 /hpf   RBC, UA 3-10 (A) 0 - 2 /hpf   Epithelial Cells (non renal) 0-10 0 - 10 /hpf   Bacteria, UA Moderate (A) None seen/Few  CBC with Differential  Result Value Ref Range   WBC 7.0 3.4 - 10.8 x10E3/uL   RBC 4.74 3.77 - 5.28 x10E6/uL   Hemoglobin 15.3 11.1 - 15.9 g/dL   Hematocrit 46.4 34.0 - 46.6 %   MCV 98 (H) 79 - 97 fL   MCH 32.3 26.6 - 33.0 pg   MCHC 33.0 31.5 - 35.7 g/dL   RDW 12.9 12.3 - 15.4 %   Platelets 332 150 - 379 x10E3/uL   Neutrophils 61 Not Estab. %   Lymphs 28 Not Estab. %   Monocytes 8 Not Estab. %   Eos 3 Not Estab. %   Basos 0 Not Estab. %   Neutrophils Absolute 4.3 1.4 - 7.0 x10E3/uL   Lymphocytes Absolute 1.9 0.7 - 3.1  x10E3/uL   Monocytes Absolute 0.5 0.1 - 0.9 x10E3/uL   EOS (ABSOLUTE) 0.2 0.0 - 0.4 x10E3/uL   Basophils Absolute 0.0 0.0 - 0.2 x10E3/uL   Immature Granulocytes 0 Not Estab. %   Immature Grans (Abs) 0.0 0.0 - 0.1 x10E3/uL  Comp Met (CMET)  Result Value Ref Range   Glucose 103 (H) 65 - 99 mg/dL   BUN 14 6 - 24 mg/dL   Creatinine, Ser 0.77 0.57 - 1.00 mg/dL   GFR calc non Af Amer 85 >59 mL/min/1.73   GFR calc Af Amer 98 >59 mL/min/1.73   BUN/Creatinine Ratio 18 9 - 23   Sodium 139 134 - 144 mmol/L   Potassium 4.3 3.5 - 5.2 mmol/L   Chloride 99 96 - 106 mmol/L   CO2 26 20 - 29 mmol/L   Calcium 9.5 8.7 - 10.2 mg/dL   Total Protein 7.0 6.0 - 8.5 g/dL   Albumin 4.3 3.5 - 5.5 g/dL   Globulin, Total 2.7 1.5 - 4.5 g/dL   Albumin/Globulin Ratio 1.6 1.2 - 2.2   Bilirubin Total 0.2 0.0 - 1.2 mg/dL   Alkaline Phosphatase 108 39 - 117 IU/L   AST 14 0 - 40 IU/L   ALT 15 0 - 32 IU/L  TSH    Result Value Ref Range   TSH 1.580 0.450 - 4.500 uIU/mL  Urinalysis, Routine w reflex microscopic  Result Value Ref Range   Specific Gravity, UA 1.020 1.005 - 1.030   pH, UA 7.5 5.0 - 7.5   Color, UA Straw Yellow   Appearance Ur Turbid (A) Clear   Leukocytes, UA 3+ (A) Negative   Protein, UA 2+ (A) Negative/Trace   Glucose, UA Negative Negative   Ketones, UA Negative Negative   RBC, UA 2+ (A) Negative   Bilirubin, UA Negative Negative   Urobilinogen, Ur 0.2 0.2 - 1.0 mg/dL   Nitrite, UA Positive (A) Negative   Microscopic Examination See below:   Lipid Panel w/o Chol/HDL Ratio  Result Value Ref Range   Cholesterol, Total 196 100 - 199 mg/dL   Triglycerides 133 0 - 149 mg/dL   HDL 48 >39 mg/dL   VLDL Cholesterol Cal 27 5 - 40 mg/dL   LDL Calculated 121 (H) 0 - 99 mg/dL      Assessment & Plan:   Problem List Items Addressed This Visit      Cardiovascular and Mediastinum   Essential (primary) hypertension - Primary    The current medical regimen is effective;  continue present plan and medications.         Nervous and Auditory   Cerebral palsy (HCC)    The current medical regimen is effective;  continue present plan and medications.         Other   Hyperlipidemia, unspecified    Will start back meds today      Relevant Orders   Basic metabolic panel   LP+ALT+AST Piccolo, Waived   Major depressive disorder, single episode, unspecified    The current medical regimen is effective;  continue present plan and medications.        Other Visit Diagnoses    Immunization due       Relevant Orders   Flu Vaccine QUAD 6+ mos PF IM (Fluarix Quad PF) (Completed)   Colon cancer screening           Follow up plan: Return in about 6 months (around 02/15/2019) for Physical Exam.

## 2018-08-16 NOTE — Assessment & Plan Note (Signed)
The current medical regimen is effective;  continue present plan and medications.  

## 2018-08-17 ENCOUNTER — Encounter: Payer: Self-pay | Admitting: Family Medicine

## 2018-08-17 LAB — BASIC METABOLIC PANEL
BUN/Creatinine Ratio: 19 (ref 12–28)
BUN: 16 mg/dL (ref 8–27)
CALCIUM: 9.9 mg/dL (ref 8.7–10.3)
CO2: 25 mmol/L (ref 20–29)
CREATININE: 0.83 mg/dL (ref 0.57–1.00)
Chloride: 97 mmol/L (ref 96–106)
GFR, EST AFRICAN AMERICAN: 89 mL/min/{1.73_m2} (ref >59)
GFR, EST NON AFRICAN AMERICAN: 77 mL/min/{1.73_m2} (ref >59)
Glucose: 101 mg/dL — ABNORMAL HIGH (ref 65–99)
POTASSIUM: 4.3 mmol/L (ref 3.5–5.2)
SODIUM: 138 mmol/L (ref 134–144)

## 2018-09-10 ENCOUNTER — Other Ambulatory Visit: Payer: Self-pay

## 2018-09-10 ENCOUNTER — Ambulatory Visit
Admission: EM | Admit: 2018-09-10 | Discharge: 2018-09-10 | Disposition: A | Discharge status: Home / Self Care | Payer: Medicare Other | Attending: Family Medicine | Admitting: Family Medicine

## 2018-09-10 DIAGNOSIS — K0889 Other specified disorders of teeth and supporting structures: Secondary | ICD-10-CM

## 2018-09-10 MED ORDER — AMOXICILLIN-POT CLAVULANATE 875-125 MG PO TABS: 1.0000 | ORAL_TABLET | Freq: Two times a day (BID) | ORAL | 0 refills | Status: DC

## 2018-09-10 NOTE — ED Provider Notes (Signed)
[facility]    CSN: [id number] Arrival date & time: 09/10/18  1255  History   Chief Complaint Chief Complaint  Patient presents with  . Dental Pain   HPI  62 year old female presents with dental pain.  Patient reports a one-week history of dental pain.  Right lower last molar.  Patient reports that she has had facial swelling as of this morning.  Her pain is severe.  No fevers or chills.  No respiratory symptoms.  No medications or interventions tried.  No other associated symptoms.  No other complaints.  PMH, Surgical Hx, Family Hx, Social History reviewed and updated as below.  Past Medical History:  Diagnosis Date  . Allergic rhinitis   . Ataxia   . CP (cerebral palsy) ([place])   . Depression   . Diverticulitis   . Diverticulosis   . Hyperlipidemia   . Hypertension   . IFG (impaired fasting glucose)   . Venous insufficiency   . Venous stasis ulcer ([place])   . Vertigo     Patient Active Problem List   Diagnosis Date Noted  . Cerebral palsy ([place]) 12/27/2015  . Hyperlipidemia, unspecified 12/27/2015  . Essential (primary) hypertension 12/27/2015  . Depression 11/20/2015  . Bursitis of shoulder 11/20/2015  . Major depressive disorder, single episode, unspecified 11/20/2015    Past Surgical History:  Procedure Laterality Date  . ABDOMINAL HYSTERECTOMY     complete  . LAPAROSCOPY    . legs and hip     due to CP    OB History   None      Home Medications    Prior to Admission medications   Medication Sig Start Date End Date Taking? Authorizing Provider  amoxicillin-clavulanate (AUGMENTIN) 875-125 MG tablet Take 1 tablet by mouth every 12 (twelve) hours. 09/10/18   Coral Spikes, DO  aspirin EC 81 MG tablet Take 81 mg by mouth daily.    [provider]  baclofen (LIORESAL) 10 MG tablet Take 1 tablet (10 mg total) by mouth daily. 02/02/18   Guadalupe Maple, MD  cyclobenzaprine (FLEXERIL) 5 MG tablet Take 1 tablet (5 mg total) by mouth 3  (three) times daily as needed for muscle spasms. 02/02/18   Guadalupe Maple, MD  FLUoxetine (PROZAC) 20 MG capsule Take 1 capsule (20 mg total) by mouth daily. 02/02/18   Guadalupe Maple, MD  hydrochlorothiazide (HYDRODIURIL) 12.5 MG tablet Take 1 tablet (12.5 mg total) by mouth daily. 02/02/18   Guadalupe Maple, MD  Incontinence Supply Disposable (ENTRUST PLUS DISP UNDERPADS) MISC by Does not apply route.    [provider]  Incontinence Supply Disposable (PROTECTIVE UNDERWEAR LARGE) MISC by Does not apply route.    [provider]  LORazepam (ATIVAN) 1 MG tablet Take 1 tablet (1 mg total) by mouth daily as needed for anxiety. 02/02/18   Guadalupe Maple, MD  meloxicam (MOBIC) 15 MG tablet Take 1 tablet (15 mg total) by mouth daily. 02/02/18   Guadalupe Maple, MD  simvastatin (ZOCOR) 20 MG tablet Take 1 tablet (20 mg total) by mouth daily. 02/02/18   Guadalupe Maple, MD    Family History Family History  Problem Relation Age of Onset  . Diabetes Mother   . Diabetes Father   . Heart disease Maternal Uncle 46  . Kidney disease Maternal Uncle     Social History Social History   Tobacco Use  . Smoking status: Current Every Day Smoker    Packs/day: 0.25  Types: Cigarettes  . Smokeless tobacco: Never Used  Substance Use Topics  . Alcohol use: No    Alcohol/week: 0.0 standard drinks  . Drug use: No     Allergies   No known allergies   Review of Systems Review of Systems  Constitutional: Negative.   HENT: Positive for dental problem.    Physical Exam Triage Vital Signs ED Triage Vitals  Enc Vitals Group     BP 09/10/18 1309 (!) 143/82     Pulse Rate 09/10/18 1309 84     Resp 09/10/18 1309 16     Temp 09/10/18 1309 98.3 F (36.8 C)     Temp Source 09/10/18 1309 Oral     SpO2 09/10/18 1309 95 %     Weight 09/10/18 1307 160 lb (72.6 kg)     Height 09/10/18 1307 5' (1.524 m)     Head Circumference --      Peak Flow --      Pain Score 09/10/18 1307 8      Pain Loc --      Pain Edu? --      Excl. in [place]? --    Updated Vital Signs BP (!) 143/82 (BP Location: Right Arm)   Pulse 84   Temp 98.3 F (36.8 C) (Oral)   Resp 16   Ht 5' (1.524 m)   Wt 72.6 kg   SpO2 95%   BMI 31.25 kg/m   Visual Acuity Right Eye Distance:   Left Eye Distance:   Bilateral Distance:    Right Eye Near:   Left Eye Near:    Bilateral Near:     Physical Exam  Constitutional: She is oriented to person, place, and time. She appears well-developed. No distress.  HENT:  Head: Normocephalic and atraumatic.  Oropharynx clear. Multiple dental caries noted.  Right last molar tender to palpation. Mild facial swelling noted on the right.  Eyes: Conjunctivae are normal. Right eye exhibits no discharge. Left eye exhibits no discharge.  Cardiovascular: Normal rate and regular rhythm.  Pulmonary/Chest: Effort normal and breath sounds normal.  Neurological: She is alert and oriented to person, place, and time.  Nursing note and vitals reviewed.   UC Treatments / Results  Labs (all labs ordered are listed, but only abnormal results are displayed) Labs Reviewed - No data to display  EKG None  Radiology No results found.  Procedures Procedures (including critical care time)  Medications Ordered in UC Medications - No data to display  Initial Impression / Assessment and Plan / UC Course  I have reviewed the triage vital signs and the nursing notes.  Pertinent labs & imaging results that were available during my care of the patient were reviewed by me and considered in my medical decision making (see chart for details).    62 year old female presents with dental pain.  Suspect underlying infection.  Placing on Augmentin.  Final Clinical Impressions(s) / UC Diagnoses   Final diagnoses:  Pain, dental     Discharge Instructions     Antibiotic as prescribed.  See Dentist.  Take care  Dr. Lacinda Axon    ED Prescriptions    Medication Sig Dispense  Auth. Provider   amoxicillin-clavulanate (AUGMENTIN) 875-125 MG tablet Take 1 tablet by mouth every 12 (twelve) hours. 14 tablet Coral Spikes, DO     Controlled Substance Prescriptions [place] Controlled Substance Registry consulted? Not Applicable   Samarah, [place], DO 09/10/18 1352

## 2018-09-10 NOTE — Discharge Instructions (Signed)
Antibiotic as prescribed.  See Dentist.  Take care  Dr. Lacinda Axon

## 2018-09-10 NOTE — ED Triage Notes (Signed)
Pt with dental pain right lower jaw x one week. No with facial pain as well. Pain 8/10

## 2018-09-22 ENCOUNTER — Encounter: Payer: Self-pay | Admitting: Family Medicine

## 2018-09-22 ENCOUNTER — Ambulatory Visit (INDEPENDENT_AMBULATORY_CARE_PROVIDER_SITE_OTHER): Payer: Medicare Other | Admitting: Family Medicine

## 2018-09-22 VITALS — BP 130/77 | HR 83 | Temp 97.6°F

## 2018-09-22 DIAGNOSIS — G801 Spastic diplegic cerebral palsy: Secondary | ICD-10-CM | POA: Diagnosis not present

## 2018-09-22 DIAGNOSIS — M79601 Pain in right arm: Secondary | ICD-10-CM

## 2018-09-22 MED ORDER — DICLOFENAC SODIUM 1 % TD GEL: 2.0000 g | Freq: Four times a day (QID) | TRANSDERMAL | 1 refills | Status: DC

## 2018-09-22 MED ORDER — CYCLOBENZAPRINE HCL 5 MG PO TABS: 5.0000 mg | ORAL_TABLET | Freq: Three times a day (TID) | ORAL | 0 refills | Status: DC | PRN

## 2018-09-22 NOTE — Patient Instructions (Signed)
Follow up as scheduled.  

## 2018-09-22 NOTE — Assessment & Plan Note (Signed)
Referral generated to Neurology. Refilled flexeril, continue baclofen prn

## 2018-09-22 NOTE — Progress Notes (Signed)
BP 130/77   Pulse 83   Temp 97.6 F (36.4 C) (Oral)   SpO2 95%    Subjective:    Patient ID: Michelle Chandler, female    DOB: 07/10/1956, 63 y.o.   MRN: [id number]  HPI: Michelle Chandler is a 62 y.o. female  Chief Complaint  Patient presents with  . Fall    Pt has spasms in both legs and fell.    Here today after a fall in her bathroom last night. States her leg spasms caused the fall, no dizziness, LOC. Did not hit head during episode, only fell into her cane hitting the left lower back. No bruising to that area, just mild soreness. Currently managed on flexeril TID prn with baclofen as needed. Taking ativan nightly for sleep. Has not seen Neurology but wanting referral since her spasms have significantly worsened the last 2 weeks despite regularly taking her medications.   Relevant past medical, surgical, family and social history reviewed and updated as indicated. Interim medical history since our last visit reviewed. Allergies and medications reviewed and updated.  Review of Systems  Per HPI unless specifically indicated above     Objective:    BP 130/77   Pulse 83   Temp 97.6 F (36.4 C) (Oral)   SpO2 95%   Wt Readings from Last 3 Encounters:  09/10/18 160 lb (72.6 kg)  08/16/18 160 lb 12.8 oz (72.9 kg)  02/02/18 162 lb (73.5 kg)    Physical Exam  Constitutional: She appears well-developed and well-nourished.  HENT:  Head: Atraumatic.  Eyes: Conjunctivae and EOM are normal.  Neck: Normal range of motion. Neck supple.  Cardiovascular: Normal rate, regular rhythm and normal heart sounds.  Pulmonary/Chest: Effort normal and breath sounds normal.  Musculoskeletal:  In wheelchair Mild ttp over left low back at site of impact with cane Extremity strength at baseline today   Neurological: She is alert.  Skin: Skin is warm and dry.  No bruising at site of impact  Psychiatric: She has a normal mood and affect. Her behavior is normal.  Nursing note and vitals  reviewed.   Results for orders placed or performed in visit on 54/65/68  Basic metabolic panel  Result Value Ref Range   Glucose 101 (H) 65 - 99 mg/dL   BUN 16 8 - 27 mg/dL   Creatinine, Ser 0.83 0.57 - 1.00 mg/dL   GFR calc non Af Amer 77 >59 mL/min/1.73   GFR calc Af Amer 89 >59 mL/min/1.73   BUN/Creatinine Ratio 19 12 - 28   Sodium 138 134 - 144 mmol/L   Potassium 4.3 3.5 - 5.2 mmol/L   Chloride 97 96 - 106 mmol/L   CO2 25 20 - 29 mmol/L   Calcium 9.9 8.7 - 10.3 mg/dL  LP+ALT+AST Piccolo, Waived  Result Value Ref Range   ALT (SGPT) Piccolo, Waived 21 10 - 47 U/L   AST (SGOT) Piccolo, Waived 20 11 - 38 U/L   Cholesterol Piccolo, Waived 231 (H) <200 mg/dL   HDL Chol Piccolo, Waived 56 (L) >59 mg/dL   Triglycerides Piccolo,Waived 266 (H) <150 mg/dL   Chol/HDL Ratio Piccolo,Waive 4.1 mg/dL   LDL Chol Calc Piccolo Waived 122 (H) <100 mg/dL   VLDL Chol Calc Piccolo,Waive 53 (H) <30 mg/dL      Assessment & Plan:   Problem List Items Addressed This Visit      Nervous and Auditory   Cerebral palsy ([place]) - Primary    Referral generated to Neurology.  Refilled flexeril, continue baclofen prn      Relevant Orders   Ambulatory referral to Neurology    Other Visit Diagnoses    Right arm pain       Mentioned on way out the door. Diclofenac gel given, probably from arthritis. No injury, chronic issue       Follow up plan: Return for as scheduled.

## 2018-09-23 ENCOUNTER — Other Ambulatory Visit: Payer: Self-pay

## 2018-09-23 ENCOUNTER — Emergency Department
Admission: EM | Admit: 2018-09-23 | Discharge: 2018-09-25 | Disposition: A | Discharge status: Home / Self Care | Payer: Medicare Other | Attending: Emergency Medicine | Admitting: Emergency Medicine

## 2018-09-23 ENCOUNTER — Emergency Department: Payer: Medicare Other

## 2018-09-23 DIAGNOSIS — Z79899 Other long term (current) drug therapy: Secondary | ICD-10-CM | POA: Diagnosis not present

## 2018-09-23 DIAGNOSIS — W19XXXA Unspecified fall, initial encounter: Secondary | ICD-10-CM | POA: Diagnosis not present

## 2018-09-23 DIAGNOSIS — S82434A Nondisplaced oblique fracture of shaft of right fibula, initial encounter for closed fracture: Secondary | ICD-10-CM | POA: Diagnosis not present

## 2018-09-23 DIAGNOSIS — I10 Essential (primary) hypertension: Secondary | ICD-10-CM | POA: Diagnosis not present

## 2018-09-23 DIAGNOSIS — S82831A Other fracture of upper and lower end of right fibula, initial encounter for closed fracture: Secondary | ICD-10-CM | POA: Diagnosis not present

## 2018-09-23 DIAGNOSIS — M79669 Pain in unspecified lower leg: Secondary | ICD-10-CM | POA: Diagnosis not present

## 2018-09-23 DIAGNOSIS — Y929 Unspecified place or not applicable: Secondary | ICD-10-CM | POA: Insufficient documentation

## 2018-09-23 DIAGNOSIS — Z7982 Long term (current) use of aspirin: Secondary | ICD-10-CM | POA: Insufficient documentation

## 2018-09-23 DIAGNOSIS — Y999 Unspecified external cause status: Secondary | ICD-10-CM | POA: Insufficient documentation

## 2018-09-23 DIAGNOSIS — Y939 Activity, unspecified: Secondary | ICD-10-CM | POA: Diagnosis not present

## 2018-09-23 DIAGNOSIS — S8991XA Unspecified injury of right lower leg, initial encounter: Secondary | ICD-10-CM | POA: Diagnosis present

## 2018-09-23 DIAGNOSIS — F17210 Nicotine dependence, cigarettes, uncomplicated: Secondary | ICD-10-CM | POA: Insufficient documentation

## 2018-09-23 DIAGNOSIS — W010XXA Fall on same level from slipping, tripping and stumbling without subsequent striking against object, initial encounter: Secondary | ICD-10-CM | POA: Insufficient documentation

## 2018-09-23 LAB — CBC WITH DIFFERENTIAL/PLATELET
ABS IMMATURE GRANULOCYTES: 0.02 10*3/uL (ref 0.00–0.07)
BASOS PCT: 1 %
Basophils Absolute: 0.1 10*3/uL (ref 0.0–0.1)
EOS ABS: 0.2 10*3/uL (ref 0.0–0.5)
Eosinophils Relative: 2 %
HEMATOCRIT: 43.5 % (ref 36.0–46.0)
Hemoglobin: 14.2 g/dL (ref 12.0–15.0)
IMMATURE GRANULOCYTES: 0 %
Lymphocytes Relative: 22 %
Lymphs Abs: 2.1 10*3/uL (ref 0.7–4.0)
MCH: 31.8 pg (ref 26.0–34.0)
MCHC: 32.6 g/dL (ref 30.0–36.0)
MCV: 97.3 fL (ref 80.0–100.0)
MONO ABS: 0.7 10*3/uL (ref 0.1–1.0)
MONOS PCT: 7 %
Neutro Abs: 6.4 10*3/uL (ref 1.7–7.7)
Neutrophils Relative %: 68 %
PLATELETS: 372 10*3/uL (ref 150–400)
RBC: 4.47 MIL/uL (ref 3.87–5.11)
RDW: 12 % (ref 11.5–15.5)
WBC: 9.4 10*3/uL (ref 4.0–10.5)
nRBC: 0 % (ref 0.0–0.2)

## 2018-09-23 LAB — BASIC METABOLIC PANEL
ANION GAP: 9 (ref 5–15)
BUN: 12 mg/dL (ref 6–20)
CO2: 25 mmol/L (ref 22–32)
Calcium: 9.3 mg/dL (ref 8.9–10.3)
Chloride: 104 mmol/L (ref 98–111)
Creatinine, Ser: 0.65 mg/dL (ref 0.44–1.00)
GFR calc Af Amer: >60 mL/min (ref >60)
GLUCOSE: 96 mg/dL (ref 70–99)
POTASSIUM: 3.6 mmol/L (ref 3.5–5.1)
Sodium: 138 mmol/L (ref 135–145)

## 2018-09-23 LAB — URINALYSIS, COMPLETE (UACMP) WITH MICROSCOPIC
Bilirubin Urine: NEGATIVE
GLUCOSE, UA: NEGATIVE mg/dL
KETONES UR: NEGATIVE mg/dL
LEUKOCYTES UA: NEGATIVE
NITRITE: NEGATIVE
PH: 6 (ref 5.0–8.0)
PROTEIN: NEGATIVE mg/dL
Specific Gravity, Urine: 1.014 (ref 1.005–1.030)

## 2018-09-23 MED ORDER — FENTANYL CITRATE (PF) 100 MCG/2ML IJ SOLN
50.0000 ug | Freq: Once | INTRAMUSCULAR | Status: AC
  Administered 2018-09-23: 50 ug via INTRAVENOUS
  Filled 2018-09-23: qty 2

## 2018-09-23 MED ORDER — FLUOXETINE HCL 20 MG PO CAPS
20.0000 mg | ORAL_CAPSULE | Freq: Every day | ORAL | Status: DC
  Administered 2018-09-23 – 2018-09-24 (×2): 20 mg via ORAL
  Filled 2018-09-23 (×2): qty 1

## 2018-09-23 MED ORDER — BACLOFEN 10 MG PO TABS
10.0000 mg | ORAL_TABLET | Freq: Every day | ORAL | Status: DC
  Administered 2018-09-23: 10 mg via ORAL
  Filled 2018-09-23 (×3): qty 1

## 2018-09-23 MED ORDER — HYDROCHLOROTHIAZIDE 25 MG PO TABS
12.5000 mg | ORAL_TABLET | Freq: Every day | ORAL | Status: DC
  Administered 2018-09-23 – 2018-09-24 (×2): 12.5 mg via ORAL
  Filled 2018-09-23 (×2): qty 1

## 2018-09-23 NOTE — ED Triage Notes (Signed)
Pt to ED via EMS. Pt c/o mechanical fall, pt stated she can feel her legs get weak and she lowers herself to the ground. Pt c/o right ankle pain. Pt states the weakness in her legs has been going on since Sunday. VSS.

## 2018-09-23 NOTE — ED Provider Notes (Addendum)
[facility] Emergency Department Provider Note  ____________________________________________   I have reviewed the triage vital signs and the nursing notes. Where available I have reviewed prior notes and, if possible and indicated, outside hospital notes.    HISTORY  Chief Complaint Fall    HPI Michelle Chandler is a 62 y.o. female history of cerebral palsy and chronic leg weakness who uses a walker, patient does have a history of falls, she states that she was try to use her walker but lost her grip and fell gradually and on violently to the floor however landed awkwardly on her right shin and has pain there and is been hard to bear weight since that time.  This happened today.  Patient does not have any other complaints.  She did not hit her head she did not pass out, she states she feels weak "all the time, but not specifically weak today.  She has had some recent falls in the past with no injury.  Patient is complaining of pain to the leg.  No numbness no weakness no other injury.  Sharp nonradiating pain nothing makes it better worse when she walks on it.    Past Medical History:  Diagnosis Date  . Allergic rhinitis   . Ataxia   . CP (cerebral palsy) ([place])   . Depression   . Diverticulitis   . Diverticulosis   . Hyperlipidemia   . Hypertension   . IFG (impaired fasting glucose)   . Venous insufficiency   . Venous stasis ulcer ([place])   . Vertigo     Patient Active Problem List   Diagnosis Date Noted  . Cerebral palsy ([place]) 12/27/2015  . Hyperlipidemia 12/27/2015  . Essential (primary) hypertension 12/27/2015  . Depression 11/20/2015  . Bursitis of shoulder 11/20/2015  . Major depressive disorder, single episode 11/20/2015    Past Surgical History:  Procedure Laterality Date  . ABDOMINAL HYSTERECTOMY     complete  . LAPAROSCOPY    . legs and hip     due to CP    Prior to Admission medications   Medication Sig Start Date End Date Taking?  Authorizing Provider  amoxicillin-clavulanate (AUGMENTIN) 875-125 MG tablet Take 1 tablet by mouth every 12 (twelve) hours. 09/10/18   Coral Spikes, DO  aspirin EC 81 MG tablet Take 81 mg by mouth daily.    [provider]  baclofen (LIORESAL) 10 MG tablet Take 1 tablet (10 mg total) by mouth daily. 02/02/18   Guadalupe Maple, MD  cyclobenzaprine (FLEXERIL) 5 MG tablet Take 1 tablet (5 mg total) by mouth 3 (three) times daily as needed for muscle spasms. 09/22/18   Volney American, PA-C  diclofenac sodium (VOLTAREN) 1 % GEL Apply 2 g topically 4 (four) times daily. 09/22/18   Volney American, PA-C  FLUoxetine (PROZAC) 20 MG capsule Take 1 capsule (20 mg total) by mouth daily. 02/02/18   Guadalupe Maple, MD  hydrochlorothiazide (HYDRODIURIL) 12.5 MG tablet Take 1 tablet (12.5 mg total) by mouth daily. 02/02/18   Guadalupe Maple, MD  Incontinence Supply Disposable (ENTRUST PLUS DISP UNDERPADS) MISC by Does not apply route.    [provider]  Incontinence Supply Disposable (PROTECTIVE UNDERWEAR LARGE) MISC by Does not apply route.    [provider]  LORazepam (ATIVAN) 1 MG tablet Take 1 tablet (1 mg total) by mouth daily as needed for anxiety. 02/02/18   Guadalupe Maple, MD  meloxicam (MOBIC) 15 MG tablet Take  1 tablet (15 mg total) by mouth daily. 02/02/18   Guadalupe Maple, MD  simvastatin (ZOCOR) 20 MG tablet Take 1 tablet (20 mg total) by mouth daily. 02/02/18   Guadalupe Maple, MD    Allergies No known allergies  Family History  Problem Relation Age of Onset  . Diabetes Mother   . Diabetes Father   . Heart disease Maternal Uncle 46  . Kidney disease Maternal Uncle     Social History Social History   Tobacco Use  . Smoking status: Current Every Day Smoker    Packs/day: 0.25    Types: Cigarettes  . Smokeless tobacco: Never Used  Substance Use Topics  . Alcohol use: No    Alcohol/week: 0.0 standard drinks  . Drug use: No    Review of  Systems Constitutional: No fever/chills Eyes: No visual changes. ENT: No sore throat. No stiff neck no neck pain Cardiovascular: Denies chest pain. Respiratory: Denies shortness of breath. Gastrointestinal:   no vomiting.  No diarrhea.  No constipation. Genitourinary: Negative for dysuria. Musculoskeletal: Negative lower extremity swelling Skin: Negative for rash. Neurological: Negative for severe headaches, focal weakness or numbness.   ____________________________________________   PHYSICAL EXAM:  VITAL SIGNS: ED Triage Vitals  Enc Vitals Group     BP 09/23/18 1359 (!) 148/89     Pulse Rate 09/23/18 1359 81     Resp 09/23/18 1359 16     Temp 09/23/18 1359 98.1 F (36.7 C)     Temp Source 09/23/18 1359 Oral     SpO2 09/23/18 1359 100 %     Weight 09/23/18 1400 160 lb (72.6 kg)     Height 09/23/18 1400 5' (1.524 m)     Head Circumference --      [facility] Flow --      Pain Score 09/23/18 1400 8     Pain Loc --      Pain Edu? --      Excl. in [place]? --     Constitutional: Alert and oriented. Well appearing and in no acute distress. Eyes: Conjunctivae are normal Head: Atraumatic HEENT: No congestion/rhinnorhea. Mucous membranes are moist.  Oropharynx non-erythematous Neck:   Nontender with no meningismus, no masses, no stridor Cardiovascular: Normal rate, regular rhythm. Grossly normal heart sounds.  Good peripheral circulation. Respiratory: Normal respiratory effort.  No retractions. Lungs CTAB. Abdominal: Soft and nontender. No distention. No guarding no rebound Back:  There is no focal tenderness or step off.  there is no midline tenderness there are no lesions noted. there is no CVA tenderness Musculoskeletal: Tenderness to palpation proximal to the ankle the ankle itself does not hurt, on the right.  There is no knee or hip pain noted.  There is no deformity noted there is no evidence of an open fracture no upper extremity tenderness. No joint effusions, no DVT signs strong  distal pulses no edema Neurologic:  Normal speech and language. No gross focal neurologic deficits are appreciated.  Skin:  Skin is warm, dry and intact. No rash noted. Psychiatric: Mood and affect are normal. Speech and behavior are normal.  ____________________________________________   LABS (all labs ordered are listed, but only abnormal results are displayed)  Labs Reviewed  URINALYSIS, COMPLETE (UACMP) WITH MICROSCOPIC  CBC WITH DIFFERENTIAL/PLATELET  BASIC METABOLIC PANEL    Pertinent labs  results that were available during my care of the patient were reviewed by me and considered in my medical decision making (see chart for details). ____________________________________________  EKG  I personally interpreted any EKGs ordered by me or triage  ____________________________________________  RADIOLOGY  Pertinent labs & imaging results that were available during my care of the patient were reviewed by me and considered in my medical decision making (see chart for details). If possible, patient and/or family made aware of any abnormal findings.  Dg Ankle Complete Right  Result Date: 09/23/2018 CLINICAL DATA:  Lateral ankle pain after fall last night. EXAM: RIGHT ANKLE - COMPLETE 3+ VIEW COMPARISON:  None. FINDINGS: Acute, nondisplaced oblique fracture of the distal fibular metadiaphysis. The ankle mortise is symmetric. The talar dome is intact. No tibiotalar joint effusion. Joint spaces are preserved. Osteopenia. Diffuse soft tissue swelling. IMPRESSION: 1. Acute nondisplaced fracture of the distal fibular metadiaphysis. Electronically Signed   By: Titus Dubin M.D.   On: 09/23/2018 14:56   ____________________________________________    PROCEDURES  Procedure(s) performed: None  Procedures  Critical Care performed: None  ____________________________________________   INITIAL IMPRESSION / ASSESSMENT AND PLAN / ED COURSE  Pertinent labs & imaging results that were  available during my care of the patient were reviewed by me and considered in my medical decision making (see chart for details).  With non-syncopal fall history of frequent falls, sustained what appears to me to be a nondisplaced tibial fracture, we will see what x-ray radiology read is, will give her pain medication, because she has had some frequent falls over the last couple weeks we will obtain urinalysis and CBC BMP just to make sure nothing else is amiss, but she and family feel that this is secondary to her cerebral palsy getting gradually worse over time  ----------------------------------------- 5:04 PM on 09/23/2018 ----------------------------------------- ' I discussed with Dr. Sabra Heck, patient is to be weightbearing in transfer only, posterior and stirrup splint to be placed, he does not feel any further interventions required in the ER, patient does have a walker and can effectuate this at home, we will send her home with pain medication, return precautions follow-up given understood  ----------------------------------------- 8:20 PM on 09/23/2018 -----------------------------------------  Patient unable to get around the house and family are worried that they will be able to get her in, we will keep her in the emergency room till tomorrow morning we can have her evaluated by social work for possible placement, we have reordered her home medications she is in no acute distress she is not requesting pain medications at this time, once it was splinted she states it felt a lot better.  Signed out at the end of my shift to Dr. Cherylann Banas     ____________________________________________   FINAL CLINICAL IMPRESSION(S) / ED DIAGNOSES  Final diagnoses:  None      This chart was dictated using voice recognition software.  Despite best efforts to proofread,  errors can occur which can change meaning.      Schuyler Amor, MD 09/23/18 1512    Schuyler Amor, MD 09/23/18  1704    Schuyler Amor, MD 09/23/18 2055

## 2018-09-24 DIAGNOSIS — E785 Hyperlipidemia, unspecified: Secondary | ICD-10-CM | POA: Diagnosis not present

## 2018-09-24 DIAGNOSIS — M6281 Muscle weakness (generalized): Secondary | ICD-10-CM | POA: Diagnosis not present

## 2018-09-24 DIAGNOSIS — R262 Difficulty in walking, not elsewhere classified: Secondary | ICD-10-CM | POA: Diagnosis not present

## 2018-09-24 DIAGNOSIS — S82401D Unspecified fracture of shaft of right fibula, subsequent encounter for closed fracture with routine healing: Secondary | ICD-10-CM | POA: Diagnosis not present

## 2018-09-24 DIAGNOSIS — F17210 Nicotine dependence, cigarettes, uncomplicated: Secondary | ICD-10-CM | POA: Diagnosis not present

## 2018-09-24 DIAGNOSIS — R5381 Other malaise: Secondary | ICD-10-CM | POA: Diagnosis not present

## 2018-09-24 DIAGNOSIS — Z743 Need for continuous supervision: Secondary | ICD-10-CM | POA: Diagnosis not present

## 2018-09-24 DIAGNOSIS — S82402D Unspecified fracture of shaft of left fibula, subsequent encounter for closed fracture with routine healing: Secondary | ICD-10-CM | POA: Diagnosis not present

## 2018-09-24 DIAGNOSIS — R279 Unspecified lack of coordination: Secondary | ICD-10-CM | POA: Diagnosis not present

## 2018-09-24 DIAGNOSIS — S82831A Other fracture of upper and lower end of right fibula, initial encounter for closed fracture: Secondary | ICD-10-CM | POA: Diagnosis not present

## 2018-09-24 DIAGNOSIS — S82209A Unspecified fracture of shaft of unspecified tibia, initial encounter for closed fracture: Secondary | ICD-10-CM | POA: Diagnosis not present

## 2018-09-24 DIAGNOSIS — F329 Major depressive disorder, single episode, unspecified: Secondary | ICD-10-CM | POA: Diagnosis not present

## 2018-09-24 DIAGNOSIS — Z7401 Bed confinement status: Secondary | ICD-10-CM | POA: Diagnosis not present

## 2018-09-24 DIAGNOSIS — Z7982 Long term (current) use of aspirin: Secondary | ICD-10-CM | POA: Diagnosis not present

## 2018-09-24 DIAGNOSIS — F3289 Other specified depressive episodes: Secondary | ICD-10-CM | POA: Diagnosis not present

## 2018-09-24 DIAGNOSIS — S8264XD Nondisplaced fracture of lateral malleolus of right fibula, subsequent encounter for closed fracture with routine healing: Secondary | ICD-10-CM | POA: Diagnosis not present

## 2018-09-24 DIAGNOSIS — I10 Essential (primary) hypertension: Secondary | ICD-10-CM | POA: Diagnosis not present

## 2018-09-24 DIAGNOSIS — F419 Anxiety disorder, unspecified: Secondary | ICD-10-CM | POA: Diagnosis not present

## 2018-09-24 DIAGNOSIS — W19XXXA Unspecified fall, initial encounter: Secondary | ICD-10-CM | POA: Diagnosis not present

## 2018-09-24 DIAGNOSIS — G809 Cerebral palsy, unspecified: Secondary | ICD-10-CM | POA: Diagnosis not present

## 2018-09-24 DIAGNOSIS — M62838 Other muscle spasm: Secondary | ICD-10-CM | POA: Diagnosis not present

## 2018-09-24 DIAGNOSIS — G509 Disorder of trigeminal nerve, unspecified: Secondary | ICD-10-CM | POA: Diagnosis not present

## 2018-09-24 DIAGNOSIS — S8261XA Displaced fracture of lateral malleolus of right fibula, initial encounter for closed fracture: Secondary | ICD-10-CM | POA: Diagnosis not present

## 2018-09-24 DIAGNOSIS — Z79899 Other long term (current) drug therapy: Secondary | ICD-10-CM | POA: Diagnosis not present

## 2018-09-24 MED ORDER — LORAZEPAM 1 MG PO TABS: 1.0000 mg | ORAL_TABLET | Freq: Every day | ORAL | 0 refills | Status: AC | PRN

## 2018-09-24 MED ORDER — AMOXICILLIN-POT CLAVULANATE 875-125 MG PO TABS
1.0000 | ORAL_TABLET | Freq: Once | ORAL | Status: AC
  Administered 2018-09-24: 1 via ORAL
  Filled 2018-09-24: qty 1

## 2018-09-24 MED ORDER — TRAMADOL HCL 50 MG PO TABS: 50.0000 mg | ORAL_TABLET | Freq: Three times a day (TID) | ORAL | 0 refills | Status: AC | PRN

## 2018-09-24 MED ORDER — TRAMADOL HCL 50 MG PO TABS
50.0000 mg | ORAL_TABLET | Freq: Once | ORAL | Status: AC
  Administered 2018-09-24: 50 mg via ORAL
  Filled 2018-09-24: qty 1

## 2018-09-24 NOTE — ED Notes (Signed)
Pt rolled and pillows replaced to leg for comfort the patient in NAD A/Ox4

## 2018-09-24 NOTE — ED Notes (Signed)
Pt adult brief changed, updated pt on dc. Pt verbalizes understanding.

## 2018-09-24 NOTE — ED Provider Notes (Signed)
-----------------------------------------   7:21 AM on 09/24/2018 -----------------------------------------   Blood pressure (!) 151/95, pulse 92, temperature 98.1 F (36.7 C), temperature source Oral, resp. rate 18, height 5' (1.524 m), weight 72.6 kg, SpO2 100 %.  The patient had no acute events since last update.  Calm and cooperative at this time.  Disposition is pending CSW team recommendations.     Paulette Blanch, MD 09/24/18 [phone]

## 2018-09-24 NOTE — NC FL2 (Addendum)
[place]le [place] LEVEL OF CARE SCREENING TOOL     IDENTIFICATION  Patient Name: Michelle Chandler Birthdate: 1956/06/07 Sex: female Admission Date (Current Location): 09/23/2018  Michelle Chandler and [place] Number:  Michelle Chandler ([id number] [place] Chandler) Facility and Address:  [facility], [address] [place],  [address]      Provider Number: [id number]  Attending Physician Name and Address:  No att. providers found  Relative Name and Phone Number:       Current Level of Care: Chandler Recommended Level of Care: Michelle Chandler Prior Approval Number:    Date Approved/Denied:   PASRR Number: [id number])  Discharge Plan: SNF    Current Diagnoses: Patient Active Problem List   Diagnosis Date Noted  . Cerebral palsy ([place]) 12/27/2015  . Hyperlipidemia 12/27/2015  . Essential (primary) hypertension 12/27/2015  . Depression 11/20/2015  . Bursitis of shoulder 11/20/2015  . Major depressive disorder, single episode 11/20/2015    Orientation RESPIRATION BLADDER Height & Weight     Self, Time, Situation, Place  Normal Continent Weight: 160 lb (72.6 kg) Height:  5' (152.4 cm)  BEHAVIORAL SYMPTOMS/MOOD NEUROLOGICAL BOWEL NUTRITION STATUS      Continent Diet(Diet: Regular )  AMBULATORY STATUS COMMUNICATION OF NEEDS Skin   Extensive Assist Verbally Normal                       Personal Care Assistance Level of Assistance  Bathing, Feeding, Dressing Bathing Assistance: Limited assistance Feeding assistance: Independent Dressing Assistance: Limited assistance     Functional Limitations Info  Sight, Hearing, Speech Sight Info: Adequate Hearing Info: Adequate Speech Info: Adequate    SPECIAL CARE FACTORS FREQUENCY  PT (By licensed PT), OT (By licensed OT)     PT Frequency: (5) OT Frequency: (5)            Contractures      Additional Factors Info  Code Status, Allergies Code Status Info: (not on file ) Allergies Info: (No Known  Allergies. )           Current Medications (09/24/2018):  This is the current Chandler active medication list Current Facility-Administered Medications  Medication Dose Route Frequency Provider Last Rate Last Dose  . baclofen (LIORESAL) tablet 10 mg  10 mg Oral Daily Schuyler Amor, MD   10 mg at 09/23/18 2044  . FLUoxetine (PROZAC) capsule 20 mg  20 mg Oral Daily Schuyler Amor, MD   20 mg at 09/23/18 2044  . hydrochlorothiazide (HYDRODIURIL) tablet 12.5 mg  12.5 mg Oral Daily Schuyler Amor, MD   12.5 mg at 09/23/18 2044   Current Outpatient Medications  Medication Sig Dispense Refill  . amoxicillin-clavulanate (AUGMENTIN) 875-125 MG tablet Take 1 tablet by mouth every 12 (twelve) hours. 14 tablet 0  . aspirin EC 81 MG tablet Take 81 mg by mouth daily.    . baclofen (LIORESAL) 10 MG tablet Take 1 tablet (10 mg total) by mouth daily. 90 each 4  . cyclobenzaprine (FLEXERIL) 5 MG tablet Take 1 tablet (5 mg total) by mouth 3 (three) times daily as needed for muscle spasms. 90 tablet 0  . diclofenac sodium (VOLTAREN) 1 % GEL Apply 2 g topically 4 (four) times daily. 100 g 1  . FLUoxetine (PROZAC) 20 MG capsule Take 1 capsule (20 mg total) by mouth daily. 90 capsule 4  . hydrochlorothiazide (HYDRODIURIL) 12.5 MG tablet Take 1 tablet (12.5 mg total) by mouth daily. 90 tablet 4  . LORazepam (ATIVAN)  1 MG tablet Take 1 tablet (1 mg total) by mouth daily as needed for anxiety. 30 tablet 1  . meloxicam (MOBIC) 15 MG tablet Take 1 tablet (15 mg total) by mouth daily. 90 tablet 1  . simvastatin (ZOCOR) 20 MG tablet Take 1 tablet (20 mg total) by mouth daily. 90 tablet 4  . Incontinence Supply Disposable (ENTRUST PLUS DISP UNDERPADS) MISC by Does not apply route.    . Incontinence Supply Disposable (PROTECTIVE UNDERWEAR LARGE) MISC by Does not apply route.    Tramadol (Ultram) 50 MG tablet (Oral, Take 1 tablet (50 mg total) by mouth 3 (three) times daly as needed for up to 7 days for moderate  pain. Qty: 20 (Twenty) tablet, Refill: 0.    Discharge Medications: Please see discharge summary for a list of discharge medications.  Relevant Imaging Results:  Relevant Lab Results:   Additional Information (SSN: [id number])  Zacharey Jensen, Veronia Beets, LCSW

## 2018-09-24 NOTE — Discharge Instructions (Addendum)
Patient meets criteria for rehabilitation.  Has a fracture, that should heal well, a condition that should improve with rehab.

## 2018-09-24 NOTE — ED Provider Notes (Signed)
-----------------------------------------   11:27 AM on 09/24/2018 -----------------------------------------  After discussion with social work they are able to place the patient into a short-term rehab.  I believe this is a very good choice for the patient given her fracture is amenable to rehabilitation, should heal with time and the patient should improve hopefully back to a more independent status. Patient was also concerned about her Augmentin as she had 1 tablet left was prescribed 09/10/18 for dental pain.  We will dose 1 more tablet and the patient may discontinue this does not need to be continued at rehab.  She is also concerned about her discomfort.  I will write the patient for tramadol to be used up to every 8 hours as needed for the next several days.  Patient agreeable to plan of care.   Harvest Dark, MD 09/24/18 [phone]

## 2018-09-24 NOTE — Progress Notes (Addendum)
Clinical Education officer, museum (CSW) received consult for SNF placement for short term rehab. Patient has Medicare and is affiliated with [facility]. Per Tommie Raymond with [facility] patient is eligible for the 3 day waiver program and can go to SNF without having a 3 night inpatient stay. [facility] made a bed offer and patient and her mother [name] accepted bed offer. [facility] is in the preferred SNF network for the [facility] waiver program. Per [facility] SNF authorization # [id number]. Per Tommie Raymond patient does not require a PT consult as long as the physician writes a note stating that patient has a rehabable need. MD has agreed to write this note. [facility] will D[facility]C to [facility] today. Per Otila Kluver [facility] liaison patient can come today to room 809. RN will call report and arrange EMS for transport. CSW sent FL2 and prescriptions to [facility]. PASARR is complete. Patient and her mother [name] are aware of above. Please reconsult if future social work needs arise. CSW signing off.   [employer], LCSW [phone]

## 2018-09-24 NOTE — ED Notes (Signed)
Report to Legrand Como at [facility], [place].

## 2018-09-27 ENCOUNTER — Other Ambulatory Visit: Payer: Self-pay | Admitting: *Deleted

## 2018-09-27 DIAGNOSIS — S82402D Unspecified fracture of shaft of left fibula, subsequent encounter for closed fracture with routine healing: Secondary | ICD-10-CM | POA: Diagnosis not present

## 2018-09-27 DIAGNOSIS — E785 Hyperlipidemia, unspecified: Secondary | ICD-10-CM | POA: Diagnosis not present

## 2018-09-27 DIAGNOSIS — F329 Major depressive disorder, single episode, unspecified: Secondary | ICD-10-CM | POA: Diagnosis not present

## 2018-09-27 DIAGNOSIS — I10 Essential (primary) hypertension: Secondary | ICD-10-CM | POA: Diagnosis not present

## 2018-09-27 DIAGNOSIS — G509 Disorder of trigeminal nerve, unspecified: Secondary | ICD-10-CM | POA: Diagnosis not present

## 2018-09-27 NOTE — Patient Outreach (Signed)
[facility] [facility]) Care Management  09/27/2018  [name] 1956/08/24 [id number]  Attended the interdisciplinary team meeting at [facility].  Patient recently arrived to SNF after a recent fall resulting in an ankle fracture.  Patient has cerebral palsy.  SW states patient has an in home aide during the day and lives with in the home with her mother.  Discharge plan is for her to return home with mother and caregiver.  No discharge date has been set.  Plan to see patient at next facility visit.   Rutherford Limerick RN, BSN [place] Acute Care Coordinator [phone]) Business Mobile [phone]) Toll free office

## 2018-10-04 DIAGNOSIS — S8261XA Displaced fracture of lateral malleolus of right fibula, initial encounter for closed fracture: Secondary | ICD-10-CM | POA: Diagnosis not present

## 2018-10-05 DIAGNOSIS — F329 Major depressive disorder, single episode, unspecified: Secondary | ICD-10-CM | POA: Diagnosis not present

## 2018-10-05 DIAGNOSIS — I10 Essential (primary) hypertension: Secondary | ICD-10-CM | POA: Diagnosis not present

## 2018-10-05 DIAGNOSIS — S82402D Unspecified fracture of shaft of left fibula, subsequent encounter for closed fracture with routine healing: Secondary | ICD-10-CM | POA: Diagnosis not present

## 2018-10-05 DIAGNOSIS — E785 Hyperlipidemia, unspecified: Secondary | ICD-10-CM | POA: Diagnosis not present

## 2018-10-05 DIAGNOSIS — G509 Disorder of trigeminal nerve, unspecified: Secondary | ICD-10-CM | POA: Diagnosis not present

## 2018-10-11 ENCOUNTER — Other Ambulatory Visit: Payer: Self-pay | Admitting: *Deleted

## 2018-10-11 NOTE — Patient Outreach (Signed)
[place] [facility]) Care Management  10/11/2018  Michelle Chandler 08-24-1956 [id number]   Attended interdisciplinary team meeting with [facility] UM team member Marlowe Kays at [facility].  PT stated patient has a cast to Right lower leg.  Continues to be NWB and nursing using a hoyer lift for patient.  Patient has ortho appt. Next week.  SW states patient is cared for by elderly mother.   Visited patient at the bedside.  Mother at bedside also.  Patient and mother familiar with [facility] Care Management.  No discharge date planned yet.   Plan to revisit patient at next facility visit.  Rutherford Limerick RN, BSN [place] Acute Care Coordinator [phone]) Business Mobile [phone]) Toll free office

## 2018-10-16 DIAGNOSIS — F329 Major depressive disorder, single episode, unspecified: Secondary | ICD-10-CM | POA: Diagnosis not present

## 2018-10-16 DIAGNOSIS — S82402D Unspecified fracture of shaft of left fibula, subsequent encounter for closed fracture with routine healing: Secondary | ICD-10-CM | POA: Diagnosis not present

## 2018-10-16 DIAGNOSIS — G809 Cerebral palsy, unspecified: Secondary | ICD-10-CM | POA: Diagnosis not present

## 2018-10-16 DIAGNOSIS — I10 Essential (primary) hypertension: Secondary | ICD-10-CM | POA: Diagnosis not present

## 2018-10-16 DIAGNOSIS — E785 Hyperlipidemia, unspecified: Secondary | ICD-10-CM | POA: Diagnosis not present

## 2018-10-17 DIAGNOSIS — S8261XA Displaced fracture of lateral malleolus of right fibula, initial encounter for closed fracture: Secondary | ICD-10-CM | POA: Diagnosis not present

## 2018-10-18 ENCOUNTER — Other Ambulatory Visit: Payer: Self-pay | Admitting: *Deleted

## 2018-10-18 NOTE — Patient Outreach (Signed)
[place] [facility]) Care Management  10/18/2018  Neko Mcgeehan Jul 19, 1956 [id number]  Attended interdisciplinary [facility] meeting at [facility] to discuss patient's progress and plan for transitioning to home.  PT reports patient had an ortho appointment on Dec 2, new order patient must be non-weight bearing for another 2 weeks.  SW reports patient is part of the CAP program and he plans to call the CAP program representative to make sure patient does not lose her spot.  No discharge date set at this time.  Visited patient at the bedside.  Patient stated her doctor gave her a good report at her appointment yesterday stating her leg was healing well.  Patient stated she continues to feel anxious when standing while working with PT. Patient stated she was trying to overcome her fear because she wanted to be able to walk with her walker or cane inside her house and limit her wheelchair use. Patient stated she uses [facility] for her CAP program and they are saving her spot as far as she knows. Explained [employer] Education officer, community.  Gave patient a packet with [facility] information to look over.  She also wanted to talk it over with her mom who is her main caregiver. Patient stated she would look over packet and we could talk about her discharge needs closer to discharge date.   Plan to visit patient at next facility site visit.   Rutherford Limerick RN, BSN [place] Acute Care Coordinator [phone]) Business Mobile [phone]) Toll free office

## 2018-10-27 DIAGNOSIS — I10 Essential (primary) hypertension: Secondary | ICD-10-CM | POA: Diagnosis not present

## 2018-10-27 DIAGNOSIS — F329 Major depressive disorder, single episode, unspecified: Secondary | ICD-10-CM | POA: Diagnosis not present

## 2018-10-27 DIAGNOSIS — S82401D Unspecified fracture of shaft of right fibula, subsequent encounter for closed fracture with routine healing: Secondary | ICD-10-CM | POA: Diagnosis not present

## 2018-10-27 DIAGNOSIS — E785 Hyperlipidemia, unspecified: Secondary | ICD-10-CM | POA: Diagnosis not present

## 2018-10-27 DIAGNOSIS — G809 Cerebral palsy, unspecified: Secondary | ICD-10-CM | POA: Diagnosis not present

## 2018-10-28 ENCOUNTER — Other Ambulatory Visit: Payer: Self-pay | Admitting: *Deleted

## 2018-10-28 DIAGNOSIS — M62838 Other muscle spasm: Secondary | ICD-10-CM | POA: Diagnosis not present

## 2018-10-28 DIAGNOSIS — I10 Essential (primary) hypertension: Secondary | ICD-10-CM | POA: Diagnosis not present

## 2018-10-28 DIAGNOSIS — E785 Hyperlipidemia, unspecified: Secondary | ICD-10-CM | POA: Diagnosis not present

## 2018-10-28 DIAGNOSIS — S8264XD Nondisplaced fracture of lateral malleolus of right fibula, subsequent encounter for closed fracture with routine healing: Secondary | ICD-10-CM | POA: Diagnosis not present

## 2018-10-28 DIAGNOSIS — Z7982 Long term (current) use of aspirin: Secondary | ICD-10-CM | POA: Diagnosis not present

## 2018-10-28 DIAGNOSIS — M7551 Bursitis of right shoulder: Secondary | ICD-10-CM | POA: Diagnosis not present

## 2018-10-28 DIAGNOSIS — G809 Cerebral palsy, unspecified: Secondary | ICD-10-CM | POA: Diagnosis not present

## 2018-10-28 DIAGNOSIS — F329 Major depressive disorder, single episode, unspecified: Secondary | ICD-10-CM | POA: Diagnosis not present

## 2018-10-28 DIAGNOSIS — F17210 Nicotine dependence, cigarettes, uncomplicated: Secondary | ICD-10-CM | POA: Diagnosis not present

## 2018-10-28 DIAGNOSIS — Z9181 History of falling: Secondary | ICD-10-CM | POA: Diagnosis not present

## 2018-10-28 DIAGNOSIS — I2510 Atherosclerotic heart disease of native coronary artery without angina pectoris: Secondary | ICD-10-CM | POA: Diagnosis not present

## 2018-10-28 DIAGNOSIS — I739 Peripheral vascular disease, unspecified: Secondary | ICD-10-CM | POA: Diagnosis not present

## 2018-10-28 DIAGNOSIS — Z79899 Other long term (current) drug therapy: Secondary | ICD-10-CM | POA: Diagnosis not present

## 2018-10-28 DIAGNOSIS — Z79891 Long term (current) use of opiate analgesic: Secondary | ICD-10-CM | POA: Diagnosis not present

## 2018-10-28 NOTE — Patient Outreach (Signed)
[facility]) Care Management  10/28/2018  Michelle Chandler Aug 03, 1956 [id number]   Attended interdisciplinary team meeting at [facility] with [facility] UM team member Marlowe Kays to discuss patient's progress and plan for transitioning to home. Patient has discharge plan to return home with Advancing to Home through [facility] and a respite benefit through patient's CAP program.  PT states patient continues to be non weight bearing.  SW states patient has no [facility] CM needs at this time.   Patient has [facility] CM contact information and is familiar with the program.  Plans to call if needs arise.   Rutherford Limerick RN, BSN [place] Acute Care Coordinator [phone]) Business Mobile [phone]) Toll free office

## 2018-10-29 DIAGNOSIS — I10 Essential (primary) hypertension: Secondary | ICD-10-CM | POA: Diagnosis not present

## 2018-10-29 DIAGNOSIS — M62838 Other muscle spasm: Secondary | ICD-10-CM | POA: Diagnosis not present

## 2018-10-29 DIAGNOSIS — I2510 Atherosclerotic heart disease of native coronary artery without angina pectoris: Secondary | ICD-10-CM | POA: Diagnosis not present

## 2018-10-29 DIAGNOSIS — S8264XD Nondisplaced fracture of lateral malleolus of right fibula, subsequent encounter for closed fracture with routine healing: Secondary | ICD-10-CM | POA: Diagnosis not present

## 2018-10-29 DIAGNOSIS — Z9181 History of falling: Secondary | ICD-10-CM | POA: Diagnosis not present

## 2018-10-29 DIAGNOSIS — G809 Cerebral palsy, unspecified: Secondary | ICD-10-CM | POA: Diagnosis not present

## 2018-10-31 ENCOUNTER — Other Ambulatory Visit: Payer: Self-pay

## 2018-10-31 ENCOUNTER — Telehealth: Payer: Self-pay

## 2018-10-31 ENCOUNTER — Telehealth: Payer: Self-pay | Admitting: Family Medicine

## 2018-10-31 DIAGNOSIS — Z9181 History of falling: Secondary | ICD-10-CM | POA: Diagnosis not present

## 2018-10-31 DIAGNOSIS — I2510 Atherosclerotic heart disease of native coronary artery without angina pectoris: Secondary | ICD-10-CM | POA: Diagnosis not present

## 2018-10-31 DIAGNOSIS — M62838 Other muscle spasm: Secondary | ICD-10-CM | POA: Diagnosis not present

## 2018-10-31 DIAGNOSIS — S8264XD Nondisplaced fracture of lateral malleolus of right fibula, subsequent encounter for closed fracture with routine healing: Secondary | ICD-10-CM | POA: Diagnosis not present

## 2018-10-31 DIAGNOSIS — G809 Cerebral palsy, unspecified: Secondary | ICD-10-CM | POA: Diagnosis not present

## 2018-10-31 DIAGNOSIS — I10 Essential (primary) hypertension: Secondary | ICD-10-CM | POA: Diagnosis not present

## 2018-10-31 NOTE — Telephone Encounter (Signed)
Copied from [place] [phone]. Topic: Quick Communication - Home Health Verbal Orders >> Oct 31, 2018 10:57 AM Bea Graff, NT wrote: Caller/Agency: [facility] Number: [phone] Requesting OT/PT/Skilled Nursing/Social Work: PT Frequency: 2 times a week for 3 weeks, 1 times a week for 1 week

## 2018-10-31 NOTE — Telephone Encounter (Signed)
Called and notified Izora Gala that Dr. Jeananne Rama gave the ok for verbal orders.

## 2018-10-31 NOTE — Telephone Encounter (Signed)
Routing to provider  

## 2018-10-31 NOTE — Telephone Encounter (Signed)
ok 

## 2018-10-31 NOTE — Telephone Encounter (Signed)
Copied from [place] [phone]. Topic: Quick Communication - Home Health Verbal Orders >> Oct 31, 2018 12:20 PM Virl Axe D wrote: Caller/Agency: [facility] Number: [phone] Requesting OT/PT/Skilled Nursing/Social Work: OT Frequency:  2 for 1 1 for 1 2 for 1    Routing to provider.

## 2018-10-31 NOTE — Patient Outreach (Signed)
[place] [facility]) Care Management  10/31/2018  Michelle Chandler 07-31-1956 [id number]    EMMI-General discharge  RED ON EMMI ALERT Day # 1 Date: 10/28/18  1:49 PM Red Alert Reason: "Read discharge papers? No, Transportation to follow-up? No"   Outreach attempt # 1 successful. Spoke with patient, HIPAA verified. Reviewed and addressed red alerts. Patient is home from rehab following a closed fracture of the distal end of her right fibula. She is currently non-weight bearing and has on a hard cast. Patient is scheduled to f/u with her Orthopedic surgeon on 11/03/18 at 2:45 PM. Her pain is well managed. Rates her pain a 5/10 and states it is tolerable. Continues to take Tramadol q 8 hrs prn. Patient lives with her mother who is available to care for her, and provide meals. She has a CNA at her bedside Monday-Sunday from 8:00 AM-2:30 PM assisting with hygiene, turning, meds and meals. Patient reports taking all prescribed medications, denies having questions or concerns. PCP ordered in home PT/OT/SNV x 4 weeks, with [facility], services started on 10/29/18. Patient is need of a W/C ramp, she is working with [employer] [place] [phone] to have a W/C ramp installed, she will call today to check on the status. Patient states once the W/C ramp is installed, she will receive transportation covered by Medicare. Patient would like a call back from RN CM to ensure no further resources are needed. RN CM instructed patient on the importance of keeping her skin clean and dry and to shift her weight and turn frequently to avoid skin breakdown. Instructed on deep breathing to keep lungs expanded. RN CM instructed patient on when to call the doctor if needed and provided RN CM phone number and 24/7 nurse advise line. Patient verbalizes understanding and is appreciative of the call.      Plan: RN CM will follow up with patient in 1-2 days for status update regarding installation of  a W/C ramp and transportation for MD appts.     Barb Merino, RN,CCM [facility] Care Management Care Management Coordinator Direct Phone: [phone] Toll Free: [phone] Fax: [phone]

## 2018-11-01 ENCOUNTER — Ambulatory Visit: Payer: Medicare Other | Admitting: Family Medicine

## 2018-11-02 ENCOUNTER — Telehealth: Payer: Self-pay | Admitting: Family Medicine

## 2018-11-02 ENCOUNTER — Other Ambulatory Visit: Payer: Self-pay

## 2018-11-02 DIAGNOSIS — Z9181 History of falling: Secondary | ICD-10-CM | POA: Diagnosis not present

## 2018-11-02 DIAGNOSIS — I10 Essential (primary) hypertension: Secondary | ICD-10-CM | POA: Diagnosis not present

## 2018-11-02 DIAGNOSIS — G809 Cerebral palsy, unspecified: Secondary | ICD-10-CM | POA: Diagnosis not present

## 2018-11-02 DIAGNOSIS — S8264XD Nondisplaced fracture of lateral malleolus of right fibula, subsequent encounter for closed fracture with routine healing: Secondary | ICD-10-CM | POA: Diagnosis not present

## 2018-11-02 DIAGNOSIS — M62838 Other muscle spasm: Secondary | ICD-10-CM | POA: Diagnosis not present

## 2018-11-02 DIAGNOSIS — I2510 Atherosclerotic heart disease of native coronary artery without angina pectoris: Secondary | ICD-10-CM | POA: Diagnosis not present

## 2018-11-02 NOTE — Telephone Encounter (Signed)
Continue current care if getting worse will need to be seen.

## 2018-11-02 NOTE — Telephone Encounter (Addendum)
Verbal orders given to Nelson. Will have patient continue regimen and increase water intake. Will be seeing patient tomorrow and will reassess URI symptoms then.

## 2018-11-02 NOTE — Telephone Encounter (Signed)
Copied from [place] [id number]. Topic: General - Other >> Nov 02, 2018 12:30 PM Carolyn Stare wrote:  Michelle Chandler with [facility] call to say pt vitals are normanl except a wheeze in her chest left lung and she reporting having cold like symptons  . Pt taking guaisenesin extend release 600 mg .   Hoping he would advise if pt need to be seen

## 2018-11-02 NOTE — Telephone Encounter (Signed)
Copied from [place] [id number]. Topic: Quick Communication - Home Health Verbal Orders >> Oct 31, 2018 10:57 AM Bea Graff, NT wrote: Caller/Agency: [place] Number: [phone] Requesting OT/PT/Skilled Nursing/Social Work: PT Frequency: 2 times a week for 3 weeks, 1 times a week for 1 week >> Nov 02, 2018 12:35 PM Carolyn Stare wrote:   Aleatha Borer would like to know when this req may be approved since pt is having some cold like sympton and wheeze  >> Nov 02, 2018  1:24 PM Guadalupe Maple, MD wrote: ok

## 2018-11-02 NOTE — Patient Outreach (Signed)
[facility]) Care Management  11/02/2018  Michelle Chandler 1956/08/26 [id number]   EMMI-General discharge FOLLOW-UP call   RED ON EMMI ALERT Day # 1 Date:10/28/18  1:49 PM Red Alert Reason: "Read discharge papers? No", Transportation to follow-up? No"   Outreach follow- up attempt # 1 successful. Spoke with patient, verified HIPAA. Patient states her nurse is currently visiting. She confirms that EMT will transport her to her MD appointment tomorrow. She states she was told her W/C ramp will be permanently installed at some point in the future but she does know when. She states she is doing well and denies having questions or concerns at this time. RN CM advised patient that no further f/u is needed at this time.    Plan: RN CM will close case due to patient is stable, she has all services in place and no further CM needs are identified at this time.    Barb Merino, RN,CCM [facility] Care Management Care Management Coordinator Direct Phone: [phone] Toll Free: [phone] Fax: [phone]

## 2018-11-03 DIAGNOSIS — S8264XD Nondisplaced fracture of lateral malleolus of right fibula, subsequent encounter for closed fracture with routine healing: Secondary | ICD-10-CM | POA: Diagnosis not present

## 2018-11-03 DIAGNOSIS — Z9181 History of falling: Secondary | ICD-10-CM | POA: Diagnosis not present

## 2018-11-03 DIAGNOSIS — I2510 Atherosclerotic heart disease of native coronary artery without angina pectoris: Secondary | ICD-10-CM | POA: Diagnosis not present

## 2018-11-03 DIAGNOSIS — I10 Essential (primary) hypertension: Secondary | ICD-10-CM | POA: Diagnosis not present

## 2018-11-03 DIAGNOSIS — S8261XA Displaced fracture of lateral malleolus of right fibula, initial encounter for closed fracture: Secondary | ICD-10-CM | POA: Diagnosis not present

## 2018-11-03 DIAGNOSIS — G809 Cerebral palsy, unspecified: Secondary | ICD-10-CM | POA: Diagnosis not present

## 2018-11-03 DIAGNOSIS — M62838 Other muscle spasm: Secondary | ICD-10-CM | POA: Diagnosis not present

## 2018-11-07 DIAGNOSIS — M62838 Other muscle spasm: Secondary | ICD-10-CM | POA: Diagnosis not present

## 2018-11-07 DIAGNOSIS — I2510 Atherosclerotic heart disease of native coronary artery without angina pectoris: Secondary | ICD-10-CM | POA: Diagnosis not present

## 2018-11-07 DIAGNOSIS — I10 Essential (primary) hypertension: Secondary | ICD-10-CM | POA: Diagnosis not present

## 2018-11-07 DIAGNOSIS — S8264XD Nondisplaced fracture of lateral malleolus of right fibula, subsequent encounter for closed fracture with routine healing: Secondary | ICD-10-CM | POA: Diagnosis not present

## 2018-11-07 DIAGNOSIS — Z9181 History of falling: Secondary | ICD-10-CM | POA: Diagnosis not present

## 2018-11-07 DIAGNOSIS — G809 Cerebral palsy, unspecified: Secondary | ICD-10-CM | POA: Diagnosis not present

## 2018-11-10 DIAGNOSIS — S8264XD Nondisplaced fracture of lateral malleolus of right fibula, subsequent encounter for closed fracture with routine healing: Secondary | ICD-10-CM | POA: Diagnosis not present

## 2018-11-10 DIAGNOSIS — I10 Essential (primary) hypertension: Secondary | ICD-10-CM | POA: Diagnosis not present

## 2018-11-10 DIAGNOSIS — I2510 Atherosclerotic heart disease of native coronary artery without angina pectoris: Secondary | ICD-10-CM | POA: Diagnosis not present

## 2018-11-10 DIAGNOSIS — Z9181 History of falling: Secondary | ICD-10-CM | POA: Diagnosis not present

## 2018-11-10 DIAGNOSIS — M62838 Other muscle spasm: Secondary | ICD-10-CM | POA: Diagnosis not present

## 2018-11-10 DIAGNOSIS — G809 Cerebral palsy, unspecified: Secondary | ICD-10-CM | POA: Diagnosis not present

## 2018-11-11 DIAGNOSIS — M62838 Other muscle spasm: Secondary | ICD-10-CM | POA: Diagnosis not present

## 2018-11-11 DIAGNOSIS — G809 Cerebral palsy, unspecified: Secondary | ICD-10-CM | POA: Diagnosis not present

## 2018-11-11 DIAGNOSIS — S8264XD Nondisplaced fracture of lateral malleolus of right fibula, subsequent encounter for closed fracture with routine healing: Secondary | ICD-10-CM | POA: Diagnosis not present

## 2018-11-11 DIAGNOSIS — Z9181 History of falling: Secondary | ICD-10-CM | POA: Diagnosis not present

## 2018-11-11 DIAGNOSIS — I2510 Atherosclerotic heart disease of native coronary artery without angina pectoris: Secondary | ICD-10-CM | POA: Diagnosis not present

## 2018-11-11 DIAGNOSIS — I10 Essential (primary) hypertension: Secondary | ICD-10-CM | POA: Diagnosis not present

## 2018-11-15 DIAGNOSIS — Z9181 History of falling: Secondary | ICD-10-CM | POA: Diagnosis not present

## 2018-11-15 DIAGNOSIS — G809 Cerebral palsy, unspecified: Secondary | ICD-10-CM | POA: Diagnosis not present

## 2018-11-15 DIAGNOSIS — I2510 Atherosclerotic heart disease of native coronary artery without angina pectoris: Secondary | ICD-10-CM | POA: Diagnosis not present

## 2018-11-15 DIAGNOSIS — I10 Essential (primary) hypertension: Secondary | ICD-10-CM | POA: Diagnosis not present

## 2018-11-15 DIAGNOSIS — M62838 Other muscle spasm: Secondary | ICD-10-CM | POA: Diagnosis not present

## 2018-11-15 DIAGNOSIS — S8264XD Nondisplaced fracture of lateral malleolus of right fibula, subsequent encounter for closed fracture with routine healing: Secondary | ICD-10-CM | POA: Diagnosis not present

## 2018-11-17 ENCOUNTER — Telehealth: Payer: Self-pay | Admitting: Family Medicine

## 2018-11-17 NOTE — Telephone Encounter (Signed)
Copied from [place] [id number]. Topic: Quick Communication - Home Health Verbal Orders >> Nov 17, 2018  9:22 AM Scherrie Gerlach wrote: Caller/Agency: Nancy/  [facility] Calling to report one missed visit for today and requesting verbal for  1 day 1 Week of Jan 20th to see if her weight bearing status has been increased Cb [phone]

## 2018-11-17 NOTE — Telephone Encounter (Signed)
Noted  

## 2018-11-18 DIAGNOSIS — I2510 Atherosclerotic heart disease of native coronary artery without angina pectoris: Secondary | ICD-10-CM | POA: Diagnosis not present

## 2018-11-18 DIAGNOSIS — Z9181 History of falling: Secondary | ICD-10-CM | POA: Diagnosis not present

## 2018-11-18 DIAGNOSIS — G809 Cerebral palsy, unspecified: Secondary | ICD-10-CM | POA: Diagnosis not present

## 2018-11-18 DIAGNOSIS — I10 Essential (primary) hypertension: Secondary | ICD-10-CM | POA: Diagnosis not present

## 2018-11-18 DIAGNOSIS — S8264XD Nondisplaced fracture of lateral malleolus of right fibula, subsequent encounter for closed fracture with routine healing: Secondary | ICD-10-CM | POA: Diagnosis not present

## 2018-11-18 DIAGNOSIS — M62838 Other muscle spasm: Secondary | ICD-10-CM | POA: Diagnosis not present

## 2018-11-21 ENCOUNTER — Telehealth: Payer: Self-pay | Admitting: Family Medicine

## 2018-11-21 DIAGNOSIS — I10 Essential (primary) hypertension: Secondary | ICD-10-CM | POA: Diagnosis not present

## 2018-11-21 DIAGNOSIS — M62838 Other muscle spasm: Secondary | ICD-10-CM | POA: Diagnosis not present

## 2018-11-21 DIAGNOSIS — I2510 Atherosclerotic heart disease of native coronary artery without angina pectoris: Secondary | ICD-10-CM | POA: Diagnosis not present

## 2018-11-21 DIAGNOSIS — G809 Cerebral palsy, unspecified: Secondary | ICD-10-CM | POA: Diagnosis not present

## 2018-11-21 DIAGNOSIS — Z9181 History of falling: Secondary | ICD-10-CM | POA: Diagnosis not present

## 2018-11-21 DIAGNOSIS — S8264XD Nondisplaced fracture of lateral malleolus of right fibula, subsequent encounter for closed fracture with routine healing: Secondary | ICD-10-CM | POA: Diagnosis not present

## 2018-11-21 MED ORDER — AMOXICILLIN-POT CLAVULANATE 875-125 MG PO TABS: 1.0000 | ORAL_TABLET | Freq: Two times a day (BID) | ORAL | 0 refills | Status: DC

## 2018-11-21 NOTE — Telephone Encounter (Signed)
OK to give verbal orders 

## 2018-11-21 NOTE — Telephone Encounter (Signed)
Copied from [place] [id number]. Topic: Quick Communication - Home Health Verbal Orders >> Nov 21, 2018  3:29 PM Adelene Idler wrote: Caller/Agency: [place]/[facility] Physical Therapist Callback Number: [phone] may leave messages  Wants to continue PT 1 x week for 2 weeks and hoping to increase when ortho increase weight barring

## 2018-11-21 NOTE — Telephone Encounter (Signed)
Needs to go to the dentist. Will send antibiotic in- but only 1x. Needs to see dentist.

## 2018-11-21 NOTE — Telephone Encounter (Signed)
Copied from [place] [phone]. Topic: Quick Communication - See Telephone Encounter >> Nov 21, 2018 11:46 AM Blase Mess A wrote: CRM for notification. See Telephone encounter for: 11/21/18.  Dorlene Stellmach, Patient's mother is calling because the patient has an abscess tooth . Dorlene does not have transportaion to bring the patient to the dentist or to the dr. Kinnie Feil an anitbotic to be sent to pharmacy. [place], [place] - [place] [phone] (Phone) [phone] (Fax) Please have it delivered.

## 2018-11-21 NOTE — Telephone Encounter (Signed)
Verbal orders given  

## 2018-11-21 NOTE — Telephone Encounter (Signed)
Message relayed to patient. Verbalized understanding and denied questions.   

## 2018-11-22 DIAGNOSIS — S8264XD Nondisplaced fracture of lateral malleolus of right fibula, subsequent encounter for closed fracture with routine healing: Secondary | ICD-10-CM | POA: Diagnosis not present

## 2018-11-22 DIAGNOSIS — I2510 Atherosclerotic heart disease of native coronary artery without angina pectoris: Secondary | ICD-10-CM | POA: Diagnosis not present

## 2018-11-22 DIAGNOSIS — G809 Cerebral palsy, unspecified: Secondary | ICD-10-CM | POA: Diagnosis not present

## 2018-11-22 DIAGNOSIS — M62838 Other muscle spasm: Secondary | ICD-10-CM | POA: Diagnosis not present

## 2018-11-22 DIAGNOSIS — Z9181 History of falling: Secondary | ICD-10-CM | POA: Diagnosis not present

## 2018-11-22 DIAGNOSIS — I10 Essential (primary) hypertension: Secondary | ICD-10-CM | POA: Diagnosis not present

## 2018-11-24 ENCOUNTER — Encounter: Payer: Self-pay | Admitting: *Deleted

## 2018-11-25 NOTE — Telephone Encounter (Signed)
This encounter was created in error - please disregard.

## 2018-11-29 DIAGNOSIS — M62838 Other muscle spasm: Secondary | ICD-10-CM | POA: Diagnosis not present

## 2018-11-29 DIAGNOSIS — I10 Essential (primary) hypertension: Secondary | ICD-10-CM | POA: Diagnosis not present

## 2018-11-29 DIAGNOSIS — I2510 Atherosclerotic heart disease of native coronary artery without angina pectoris: Secondary | ICD-10-CM | POA: Diagnosis not present

## 2018-11-29 DIAGNOSIS — S8264XD Nondisplaced fracture of lateral malleolus of right fibula, subsequent encounter for closed fracture with routine healing: Secondary | ICD-10-CM | POA: Diagnosis not present

## 2018-11-29 DIAGNOSIS — G809 Cerebral palsy, unspecified: Secondary | ICD-10-CM | POA: Diagnosis not present

## 2018-11-29 DIAGNOSIS — Z9181 History of falling: Secondary | ICD-10-CM | POA: Diagnosis not present

## 2018-12-01 DIAGNOSIS — S8261XA Displaced fracture of lateral malleolus of right fibula, initial encounter for closed fracture: Secondary | ICD-10-CM | POA: Diagnosis not present

## 2018-12-06 DIAGNOSIS — S8264XD Nondisplaced fracture of lateral malleolus of right fibula, subsequent encounter for closed fracture with routine healing: Secondary | ICD-10-CM | POA: Diagnosis not present

## 2018-12-06 DIAGNOSIS — I10 Essential (primary) hypertension: Secondary | ICD-10-CM | POA: Diagnosis not present

## 2018-12-06 DIAGNOSIS — Z9181 History of falling: Secondary | ICD-10-CM | POA: Diagnosis not present

## 2018-12-06 DIAGNOSIS — G809 Cerebral palsy, unspecified: Secondary | ICD-10-CM | POA: Diagnosis not present

## 2018-12-06 DIAGNOSIS — I2510 Atherosclerotic heart disease of native coronary artery without angina pectoris: Secondary | ICD-10-CM | POA: Diagnosis not present

## 2018-12-06 DIAGNOSIS — M62838 Other muscle spasm: Secondary | ICD-10-CM | POA: Diagnosis not present

## 2018-12-07 DIAGNOSIS — S8264XD Nondisplaced fracture of lateral malleolus of right fibula, subsequent encounter for closed fracture with routine healing: Secondary | ICD-10-CM | POA: Diagnosis not present

## 2018-12-07 DIAGNOSIS — G809 Cerebral palsy, unspecified: Secondary | ICD-10-CM | POA: Diagnosis not present

## 2018-12-07 DIAGNOSIS — I2510 Atherosclerotic heart disease of native coronary artery without angina pectoris: Secondary | ICD-10-CM | POA: Diagnosis not present

## 2018-12-07 DIAGNOSIS — I10 Essential (primary) hypertension: Secondary | ICD-10-CM | POA: Diagnosis not present

## 2018-12-07 DIAGNOSIS — M62838 Other muscle spasm: Secondary | ICD-10-CM | POA: Diagnosis not present

## 2018-12-07 DIAGNOSIS — Z9181 History of falling: Secondary | ICD-10-CM | POA: Diagnosis not present

## 2018-12-12 DIAGNOSIS — I2510 Atherosclerotic heart disease of native coronary artery without angina pectoris: Secondary | ICD-10-CM | POA: Diagnosis not present

## 2018-12-12 DIAGNOSIS — G809 Cerebral palsy, unspecified: Secondary | ICD-10-CM | POA: Diagnosis not present

## 2018-12-12 DIAGNOSIS — Z9181 History of falling: Secondary | ICD-10-CM | POA: Diagnosis not present

## 2018-12-12 DIAGNOSIS — I10 Essential (primary) hypertension: Secondary | ICD-10-CM | POA: Diagnosis not present

## 2018-12-12 DIAGNOSIS — S8264XD Nondisplaced fracture of lateral malleolus of right fibula, subsequent encounter for closed fracture with routine healing: Secondary | ICD-10-CM | POA: Diagnosis not present

## 2018-12-12 DIAGNOSIS — M62838 Other muscle spasm: Secondary | ICD-10-CM | POA: Diagnosis not present

## 2018-12-14 DIAGNOSIS — I10 Essential (primary) hypertension: Secondary | ICD-10-CM | POA: Diagnosis not present

## 2018-12-14 DIAGNOSIS — I2510 Atherosclerotic heart disease of native coronary artery without angina pectoris: Secondary | ICD-10-CM | POA: Diagnosis not present

## 2018-12-14 DIAGNOSIS — G809 Cerebral palsy, unspecified: Secondary | ICD-10-CM | POA: Diagnosis not present

## 2018-12-14 DIAGNOSIS — M62838 Other muscle spasm: Secondary | ICD-10-CM | POA: Diagnosis not present

## 2018-12-14 DIAGNOSIS — S8264XD Nondisplaced fracture of lateral malleolus of right fibula, subsequent encounter for closed fracture with routine healing: Secondary | ICD-10-CM | POA: Diagnosis not present

## 2018-12-14 DIAGNOSIS — Z9181 History of falling: Secondary | ICD-10-CM | POA: Diagnosis not present

## 2018-12-19 DIAGNOSIS — Z9181 History of falling: Secondary | ICD-10-CM | POA: Diagnosis not present

## 2018-12-19 DIAGNOSIS — M62838 Other muscle spasm: Secondary | ICD-10-CM | POA: Diagnosis not present

## 2018-12-19 DIAGNOSIS — I10 Essential (primary) hypertension: Secondary | ICD-10-CM | POA: Diagnosis not present

## 2018-12-19 DIAGNOSIS — G809 Cerebral palsy, unspecified: Secondary | ICD-10-CM | POA: Diagnosis not present

## 2018-12-19 DIAGNOSIS — S8264XD Nondisplaced fracture of lateral malleolus of right fibula, subsequent encounter for closed fracture with routine healing: Secondary | ICD-10-CM | POA: Diagnosis not present

## 2018-12-19 DIAGNOSIS — I2510 Atherosclerotic heart disease of native coronary artery without angina pectoris: Secondary | ICD-10-CM | POA: Diagnosis not present

## 2018-12-22 DIAGNOSIS — I10 Essential (primary) hypertension: Secondary | ICD-10-CM | POA: Diagnosis not present

## 2018-12-22 DIAGNOSIS — S8264XD Nondisplaced fracture of lateral malleolus of right fibula, subsequent encounter for closed fracture with routine healing: Secondary | ICD-10-CM | POA: Diagnosis not present

## 2018-12-22 DIAGNOSIS — Z9181 History of falling: Secondary | ICD-10-CM | POA: Diagnosis not present

## 2018-12-22 DIAGNOSIS — M62838 Other muscle spasm: Secondary | ICD-10-CM | POA: Diagnosis not present

## 2018-12-22 DIAGNOSIS — I2510 Atherosclerotic heart disease of native coronary artery without angina pectoris: Secondary | ICD-10-CM | POA: Diagnosis not present

## 2018-12-22 DIAGNOSIS — G809 Cerebral palsy, unspecified: Secondary | ICD-10-CM | POA: Diagnosis not present

## 2018-12-26 DIAGNOSIS — I10 Essential (primary) hypertension: Secondary | ICD-10-CM | POA: Diagnosis not present

## 2018-12-26 DIAGNOSIS — I2510 Atherosclerotic heart disease of native coronary artery without angina pectoris: Secondary | ICD-10-CM | POA: Diagnosis not present

## 2018-12-26 DIAGNOSIS — G809 Cerebral palsy, unspecified: Secondary | ICD-10-CM | POA: Diagnosis not present

## 2018-12-26 DIAGNOSIS — M62838 Other muscle spasm: Secondary | ICD-10-CM | POA: Diagnosis not present

## 2018-12-26 DIAGNOSIS — Z9181 History of falling: Secondary | ICD-10-CM | POA: Diagnosis not present

## 2018-12-26 DIAGNOSIS — S8264XD Nondisplaced fracture of lateral malleolus of right fibula, subsequent encounter for closed fracture with routine healing: Secondary | ICD-10-CM | POA: Diagnosis not present

## 2019-01-16 ENCOUNTER — Other Ambulatory Visit: Payer: Self-pay | Admitting: Family Medicine

## 2019-02-06 ENCOUNTER — Ambulatory Visit: Payer: Self-pay

## 2019-02-08 ENCOUNTER — Ambulatory Visit: Payer: Self-pay

## 2019-02-15 ENCOUNTER — Ambulatory Visit: Payer: Medicare Other

## 2019-02-27 ENCOUNTER — Ambulatory Visit: Payer: Medicare Other | Admitting: Family Medicine

## 2019-02-28 ENCOUNTER — Other Ambulatory Visit: Payer: Self-pay | Admitting: Family Medicine

## 2019-02-28 DIAGNOSIS — E7800 Pure hypercholesterolemia, unspecified: Secondary | ICD-10-CM

## 2019-02-28 NOTE — Telephone Encounter (Signed)
PCP is aware of lab report- patient to continue Rx Requested Prescriptions  Pending Prescriptions Disp Refills  . simvastatin (ZOCOR) 20 MG tablet [Pharmacy Med Name: SIMVASTATIN 20 MG TABLET] 90 tablet 1    Sig: Take 1 tablet (20 mg total) by mouth daily.     Cardiovascular:  Antilipid - Statins Failed - 02/28/2019  3:44 PM      Failed - Total Cholesterol in normal range and within 360 days    Cholesterol Piccolo, Waived  Date Value Ref Range Status  08/16/2018 231 (H) <200 mg/dL Final    Comment:                            Desirable                <200                         Borderline High      200- 239                         High                     >239          Failed - LDL in normal range and within 360 days    LDL Calculated  Date Value Ref Range Status  01/28/2018 121 (H) 0 - 99 mg/dL Final         Failed - HDL in normal range and within 360 days    HDL  Date Value Ref Range Status  01/28/2018 48 >39 mg/dL Final         Failed - Triglycerides in normal range and within 360 days    Triglycerides Piccolo,Waived  Date Value Ref Range Status  08/16/2018 266 (H) <150 mg/dL Final    Comment:                            Normal                   <150                         Borderline High     150 - 199                         High                200 - 499                         Very High                >499          Passed - Patient is not pregnant      Passed - Valid encounter within last 12 months    Recent Outpatient Visits          5 months ago Spastic diplegic cerebral palsy [facility])   [facility] Volney American, PA-C   6 months ago Essential hypertension   [place], Jeannette How, MD   1 year ago Essential hypertension   [place], Jeannette How, MD   1 year ago Callus  of foot   [place], [place], DO   1 year ago Essential hypertension   [place], MD      Future Appointments            In 3 months  [place], [facility]   In 4 months [place], Jeannette How, MD [facility], [facility]

## 2019-03-09 ENCOUNTER — Other Ambulatory Visit: Payer: Self-pay | Admitting: Family Medicine

## 2019-03-09 NOTE — Telephone Encounter (Signed)
This is a refill request for Ativan 1 MG tabs-this medication is not on her medication profile. Routing to PCP.

## 2019-04-14 ENCOUNTER — Other Ambulatory Visit: Payer: Self-pay | Admitting: Family Medicine

## 2019-04-14 DIAGNOSIS — I10 Essential (primary) hypertension: Secondary | ICD-10-CM

## 2019-04-14 NOTE — Telephone Encounter (Signed)
Requested medication (s) are due for refill today: Yes  Requested medication (s) are on the active medication list: Yes  Last refill:  02/02/18  Future visit scheduled: Yes  Notes to clinic:  Expired Rx, unable to refill     Requested Prescriptions  Pending Prescriptions Disp Refills   FLUoxetine (PROZAC) 20 MG capsule [Pharmacy Med Name: FLUOXETINE HCL 20 MG CAPSULE] 90 capsule 0    Sig: Take 1 capsule (20 mg total) by mouth daily.     Psychiatry:  Antidepressants - SSRI Failed - 04/14/2019  1:17 PM      Failed - Valid encounter within last 6 months    Recent Outpatient Visits          6 months ago Spastic diplegic cerebral palsy [facility])   [facility] Volney American, [place]   8 months ago Essential hypertension   [place], Jeannette How, MD   1 year ago Essential hypertension   [facility] [place], Jeannette How, MD   1 year ago Callus of foot   [place], [place], DO   1 year ago Essential hypertension   [place], Jeannette How, MD      Future Appointments            In 2 months  [place], [facility]   In 2 months [place], Jeannette How, MD [place], [place] - Completed PHQ-2 or PHQ-9 in the last 360 days.     baclofen (LIORESAL) 10 MG tablet [Pharmacy Med Name: BACLOFEN 10 MG TABLET] 90 tablet 0    Sig: Take 1 tablet (10 mg total) by mouth daily.     Not Delegated - Analgesics:  Muscle Relaxants Failed - 04/14/2019  1:17 PM      Failed - This refill cannot be delegated      Failed - Valid encounter within last 6 months    Recent Outpatient Visits          6 months ago Spastic diplegic cerebral palsy [facility])   [facility] Volney American, [place]   8 months ago Essential hypertension   [place], Jeannette How, MD   1 year ago Essential hypertension   [place], Jeannette How, MD   1 year ago  Callus of foot   [place], Megan P, DO   1 year ago Essential hypertension   [place], Jeannette How, MD      Future Appointments            In 2 months  [place], [facility]   In 2 months [place], Jeannette How, MD [place], [facility]          hydrochlorothiazide (HYDRODIURIL) 12.5 MG tablet [Pharmacy Med Name: HYDROCHLOROTHIAZIDE 12.5 MG TB] 90 tablet 0    Sig: Take 1 tablet (12.5 mg total) by mouth daily.     Cardiovascular: Diuretics - Thiazide Failed - 04/14/2019  1:17 PM      Failed - Last BP in normal range    BP Readings from Last 1 Encounters:  09/23/18 (!) 151/95         Failed - Valid encounter within last 6 months    Recent Outpatient Visits          6 months ago Spastic diplegic cerebral palsy [facility])   [facility] [name]  Benjamine Mola, PA-C   8 months ago Essential hypertension   [place], Jeannette How, MD   1 year ago Essential hypertension   [facility] [place], Jeannette How, MD   1 year ago Callus of foot   [place], [place], DO   1 year ago Essential hypertension   [place], Jeannette How, MD      Future Appointments            In 2 months  [place], [place]   In 2 months [place], Jeannette How, MD [place], [place] in normal range and within 360 days    Calcium  Date Value Ref Range Status  09/23/2018 9.3 8.9 - 10.3 mg/dL Final         Passed - Cr in normal range and within 360 days    Creatinine, Ser  Date Value Ref Range Status  09/23/2018 0.65 0.44 - 1.00 mg/dL Final         Passed - K in normal range and within 360 days    Potassium  Date Value Ref Range Status  09/23/2018 3.6 3.5 - 5.1 mmol/L Final         Passed - Na in normal range and within 360 days    Sodium  Date Value Ref Range Status  09/23/2018 138 135 - 145 mmol/L Final  08/16/2018 138 134 -  144 mmol/L Final

## 2019-06-09 ENCOUNTER — Telehealth: Payer: Self-pay | Admitting: Family Medicine

## 2019-06-09 NOTE — Telephone Encounter (Signed)
Need current office note signed and fax to [phone]

## 2019-06-10 NOTE — Telephone Encounter (Signed)
Please do

## 2019-06-21 ENCOUNTER — Ambulatory Visit (INDEPENDENT_AMBULATORY_CARE_PROVIDER_SITE_OTHER): Payer: Medicare Other

## 2019-06-21 VITALS — Ht 60.0 in | Wt 155.0 lb

## 2019-06-21 DIAGNOSIS — Z0000 Encounter for general adult medical examination without abnormal findings: Secondary | ICD-10-CM

## 2019-06-21 NOTE — Progress Notes (Signed)
Subjective:   Philis Doke is a 63 y.o. female who presents for Medicare Annual (Subsequent) preventive examination.  This visit is being conducted via phone call  - after an attmept to do on video chat - due to the COVID-19 pandemic. This patient has given me verbal consent via phone to conduct this visit, patient states they are participating from their home address. Some vital signs may be absent or patient reported.   Patient identification: identified by name, DOB, and current address.    Review of Systems:   Cardiac Risk Factors include: hypertension;advanced age (>66men, >68 women)     Objective:     Vitals: Ht 5' (1.524 m) Comment: pt reported  Wt 155 lb (70.3 kg) Comment: pt reported  BMI 30.27 kg/m   Body mass index is 30.27 kg/m.  Advanced Directives 06/21/2019 09/23/2018 09/10/2018 01/28/2018 09/20/2016  Does Patient Have a Medical Advance Directive? No No No Yes;No No  Does patient want to make changes to medical advance directive? - - - Yes (MAU/Ambulatory/Procedural Areas - Information given) -  Would patient like information on creating a medical advance directive? - - No - Patient declined - No - patient declined information    Tobacco Social History   Tobacco Use  Smoking Status Current Every Day Smoker  . Packs/day: 0.25  . Types: Cigarettes  Smokeless Tobacco Never Used     Ready to quit: No Counseling given: Yes   Clinical Intake:  Pre-visit preparation completed: Yes  Pain : No/denies pain     Nutritional Status: BMI > 30  Obese Nutritional Risks: None Diabetes: No  How often do you need to have someone help you when you read instructions, pamphlets, or other written materials from your doctor or pharmacy?: 1 - Never  Interpreter Needed?: No  Information entered by :: Aleeya Veitch,LPN  Past Medical History:  Diagnosis Date  . Allergic rhinitis   . Ataxia   . CP (cerebral palsy) ([place])   . Depression   . Diverticulitis   .  Diverticulosis   . Hyperlipidemia   . Hypertension   . IFG (impaired fasting glucose)   . Venous insufficiency   . Venous stasis ulcer ([place])   . Vertigo    Past Surgical History:  Procedure Laterality Date  . ABDOMINAL HYSTERECTOMY     complete  . LAPAROSCOPY    . legs and hip     due to CP   Family History  Problem Relation Age of Onset  . Diabetes Mother   . Diabetes Father   . Heart disease Maternal Uncle 46  . Kidney disease Maternal Uncle    Social History   Socioeconomic History  . Marital status: Single    Spouse name: Not on file  . Number of children: Not on file  . Years of education: Not on file  . Highest education level: High school graduate  Occupational History  . Occupation: disability   Social Needs  . Financial resource strain: Not hard at all  . Food insecurity    Worry: Never true    Inability: Never true  . Transportation needs    Medical: No    Non-medical: No  Tobacco Use  . Smoking status: Current Every Day Smoker    Packs/day: 0.25    Types: Cigarettes  . Smokeless tobacco: Never Used  Substance and Sexual Activity  . Alcohol use: No    Alcohol/week: 0.0 standard drinks  . Drug use: No  . Sexual activity:  Never  Lifestyle  . Physical activity    Days per week: 0 days    Minutes per session: 0 min  . Stress: Not at all  Relationships  . Social connections    Talks on phone: More than three times a week    Gets together: More than three times a week    Attends religious service: Never    Active member of club or organization: No    Attends meetings of clubs or organizations: Never    Relationship status: Never married  Other Topics Concern  . Not on file  Social History Narrative  . Not on file    Outpatient Encounter Medications as of 06/21/2019  Medication Sig  . aspirin EC 81 MG tablet Take 81 mg by mouth daily.  . baclofen (LIORESAL) 10 MG tablet Take 1 tablet (10 mg total) by mouth daily.  . cyclobenzaprine (FLEXERIL)  5 MG tablet Take 1 tablet (5 mg total) by mouth 3 (three) times daily as needed for muscle spasms.  . diclofenac sodium (VOLTAREN) 1 % GEL Apply 2 g topically 4 (four) times daily.  [name] FLUoxetine (PROZAC) 20 MG capsule Take 1 capsule (20 mg total) by mouth daily.  . hydrochlorothiazide (HYDRODIURIL) 12.5 MG tablet Take 1 tablet (12.5 mg total) by mouth daily.  . Incontinence Supply Disposable (ENTRUST PLUS DISP UNDERPADS) MISC by Does not apply route.  . Incontinence Supply Disposable (PROTECTIVE UNDERWEAR LARGE) MISC by Does not apply route.  [name] LORazepam (ATIVAN) 1 MG tablet TAKE ONE TABLET DAILY AS NEEDED FOR ANXIETY.  . meloxicam (MOBIC) 15 MG tablet Take 1 tablet (15 mg total) by mouth daily.  . simvastatin (ZOCOR) 20 MG tablet Take 1 tablet (20 mg total) by mouth daily.  . [DISCONTINUED] amoxicillin-clavulanate (AUGMENTIN) 875-125 MG tablet Take 1 tablet by mouth every 12 (twelve) hours. (Patient not taking: Reported on 06/21/2019)  . [DISCONTINUED] amoxicillin-clavulanate (AUGMENTIN) 875-125 MG tablet Take 1 tablet by mouth 2 (two) times daily. (Patient not taking: Reported on 06/21/2019)   No facility-administered encounter medications on file as of 06/21/2019.     Activities of Daily Living In your present state of health, do you have any difficulty performing the following activities: 06/21/2019  Hearing? N  Vision? N  Difficulty concentrating or making decisions? N  Walking or climbing stairs? Y  Dressing or bathing? N  Doing errands, shopping? N  Preparing Food and eating ? N  Using the Toilet? N  In the past six months, have you accidently leaked urine? Y  Comment wears depends  Do you have problems with loss of bowel control? N  Managing your Medications? N  Managing your Finances? N  Housekeeping or managing your Housekeeping? Y  Comment caretaker that helps  Some recent data might be hidden    Patient Care Team: Guadalupe Maple, MD as PCP - General (Family Medicine)     Assessment:   This is a routine wellness examination for [place].  Exercise Activities and Dietary recommendations Current Exercise Habits: The patient does not participate in regular exercise at present, Exercise limited by: None identified  Goals    . Quit Smoking     Smoking cessation discussed- not ready to quit smoking        Fall Risk: Fall Risk  06/21/2019 09/22/2018 08/16/2018 02/02/2018 01/28/2018  Falls in the past year? 1 1 No Yes Yes  Number falls in past yr: 1 0 - 2 or more 2 or more  Injury with Fall?  0 1 - No No  Risk Factor Category  - - - High Fall Risk High Fall Risk  Risk for fall due to : - History of fall(s);Impaired balance/gait - - Impaired balance/gait  Follow up - - - - Falls prevention discussed    FALL RISK PREVENTION PERTAINING TO THE HOME:  Any stairs in or around the home? Yes  If so, are there any without handrails? No   Home free of loose throw rugs in walkways, pet beds, electrical cords, etc? Yes  Adequate lighting in your home to reduce risk of falls? Yes   ASSISTIVE DEVICES UTILIZED TO PREVENT FALLS:  Life alert? No  Use of a cane, walker or w/c? Yes  Grab bars in the bathroom? Yes  Shower chair or bench in shower? Yes  Elevated toilet seat or a handicapped toilet? Yes   DME ORDERS:  DME order needed?  No   TIMED UP AND GO:  Unable to perform   Depression Screen PHQ 2/9 Scores 06/21/2019 08/16/2018 01/28/2018 06/29/2017  PHQ - 2 Score 0 1 2 0  PHQ- 9 Score - 5 7 -     Cognitive Function     6CIT Screen 01/28/2018  What Year? 0 points  What month? 0 points  What time? 0 points  Count back from 20 0 points  Months in reverse 0 points  Repeat phrase 4 points  Total Score 4    Immunization History  Administered Date(s) Administered  . Influenza,inj,Quad PF,6+ Mos 10/19/2017, 08/16/2018  . Influenza-Unspecified 11/14/2014, 09/14/2015, 10/05/2016  . Td 08/30/2007, 10/19/2017    Qualifies for Shingles Vaccine? Yes  Zostavax  completed n/a. Due for Shingrix. Education has been provided regarding the importance of this vaccine. Pt has been advised to call insurance company to determine out of pocket expense. Advised may also receive vaccine at local pharmacy or Health Dept. Verbalized acceptance and understanding.  Tdap:up to date   Flu Vaccine: up to date   Pneumococcal Vaccine: not indicated   Screening Tests Health Maintenance  Topic Date Due  . MAMMOGRAM  11/21/2016  . INFLUENZA VACCINE  06/17/2019  . COLONOSCOPY  08/17/2019 (Originally 02/27/2008)  . TETANUS/TDAP  10/20/2027  . Hepatitis C Screening  Completed  . HIV Screening  Completed    Cancer Screenings:  Colorectal Screening: cologuard ordered  Mammogram: ordered  Bone Density: not indicated   Lung Cancer Screening: (Low Dose CT Chest recommended if Age 60-80 years, 30 pack-year currently smoking OR have quit w/in 15years.) does not qualify.    Additional Screening:  Hepatitis C Screening: does qualify; Completed 11/20/2015  Vision Screening: Recommended annual ophthalmology exams for early detection of glaucoma and other disorders of the eye. Is the patient up to date with their annual eye exam?  Yes    Dental Screening: Recommended annual dental exams for proper oral hygiene  Community Resource Referral:  CRR required this visit?  No       Plan:  I have personally reviewed and addressed the Medicare Annual Wellness questionnaire and have noted the following in the patient's chart:  A. Medical and social history B. Use of alcohol, tobacco or illicit drugs  C. Current medications and supplements D. Functional ability and status E.  Nutritional status F.  Physical activity G. Advance directives H. List of other physicians I.  Hospitalizations, surgeries, and ER visits in previous 12 months J.  [place] such as hearing and vision if needed, cognitive and depression L. Referrals and appointments  In addition, I  have reviewed and discussed with patient certain preventive protocols, quality metrics, and best practice recommendations. A written personalized care plan for preventive services as well as general preventive health recommendations were provided to patient.   Signed,    Bevelyn Ngo, LPN  12/26/7987 Nurse Health Advisor   Nurse Notes:none

## 2019-06-21 NOTE — Patient Instructions (Signed)
Michelle Chandler , Thank you for taking time to come for your Medicare Wellness Visit. I appreciate your ongoing commitment to your health goals. Please review the following plan we discussed and let me know if I can assist you in the future.   Screening recommendations/referrals: Colonoscopy: cologuard ordered Mammogram: Please call [phone] to schedule your mammogram.  Bone Density: not indicated Recommended yearly ophthalmology/optometry visit for glaucoma screening and checkup Recommended yearly dental visit for hygiene and checkup  Vaccinations: Influenza vaccine: up to date  Pneumococcal vaccine: not indicated Tdap vaccine: up to date Shingles vaccine: shingrix eligible, check with your insurance company for coverage    Advanced directives: please pick up a copy of this information next time you are in the office   Conditions/risks identified: If you wish to quit smoking, help is available. For free tobacco cessation program offerings call the [facility] at [phone] or Live Well Line at 908-810-4036. You may also visit [email or web address] or email [email or web address] for more information on other programs.   Next appointment: Follow up in one year for your annual wellness visit.   Preventive Care 40-64 Years, Female Preventive care refers to lifestyle choices and visits with your health care provider that can promote health and wellness. What does preventive care include?  A yearly physical exam. This is also called an annual well check.  Dental exams once or twice a year.  Routine eye exams. Ask your health care provider how often you should have your eyes checked.  Personal lifestyle choices, including:  Daily care of your teeth and gums.  Regular physical activity.  Eating a healthy diet.  Avoiding tobacco and drug use.  Limiting alcohol use.  Practicing safe sex.  Taking low-dose aspirin daily starting at age 27.  Taking vitamin and  mineral supplements as recommended by your health care provider. What happens during an annual well check? The services and screenings done by your health care provider during your annual well check will depend on your age, overall health, lifestyle risk factors, and family history of disease. Counseling  Your health care provider may ask you questions about your:  Alcohol use.  Tobacco use.  Drug use.  Emotional well-being.  Home and relationship well-being.  Sexual activity.  Eating habits.  Work and work Statistician.  Method of birth control.  Menstrual cycle.  Pregnancy history. Screening  You may have the following tests or measurements:  Height, weight, and BMI.  Blood pressure.  Lipid and cholesterol levels. These may be checked every 5 years, or more frequently if you are over 65 years old.  Skin check.  Lung cancer screening. You may have this screening every year starting at age 32 if you have a 30-pack-year history of smoking and currently smoke or have quit within the past 15 years.  Fecal occult blood test (FOBT) of the stool. You may have this test every year starting at age 50.  Flexible sigmoidoscopy or colonoscopy. You may have a sigmoidoscopy every 5 years or a colonoscopy every 10 years starting at age 59.  Hepatitis C blood test.  Hepatitis B blood test.  Sexually transmitted disease (STD) testing.  Diabetes screening. This is done by checking your blood sugar (glucose) after you have not eaten for a while (fasting). You may have this done every 1-3 years.  Mammogram. This may be done every 1-2 years. Talk to your health care provider about when you should start having regular mammograms. This may depend on whether  you have a family history of breast cancer.  BRCA-related cancer screening. This may be done if you have a family history of breast, ovarian, tubal, or peritoneal cancers.  Pelvic exam and Pap test. This may be done every 3 years  starting at age 65. Starting at age 31, this may be done every 5 years if you have a Pap test in combination with an HPV test.  Bone density scan. This is done to screen for osteoporosis. You may have this scan if you are at high risk for osteoporosis. Discuss your test results, treatment options, and if necessary, the need for more tests with your health care provider. Vaccines  Your health care provider may recommend certain vaccines, such as:  Influenza vaccine. This is recommended every year.  Tetanus, diphtheria, and acellular pertussis (Tdap, Td) vaccine. You may need a Td booster every 10 years.  Zoster vaccine. You may need this after age 54.  Pneumococcal 13-valent conjugate (PCV13) vaccine. You may need this if you have certain conditions and were not previously vaccinated.  Pneumococcal polysaccharide (PPSV23) vaccine. You may need one or two doses if you smoke cigarettes or if you have certain conditions. Talk to your health care provider about which screenings and vaccines you need and how often you need them. This information is not intended to replace advice given to you by your health care provider. Make sure you discuss any questions you have with your health care provider. Document Released: 11/29/2015 Document Revised: 07/22/2016 Document Reviewed: 09/03/2015 Elsevier Interactive Patient Education  2017 [place] Prevention in the Home Falls can cause injuries. They can happen to people of all ages. There are many things you can do to make your home safe and to help prevent falls. What can I do on the outside of my home?  Regularly fix the edges of walkways and driveways and fix any cracks.  Remove anything that might make you trip as you walk through a door, such as a raised step or threshold.  Trim any bushes or trees on the path to your home.  Use bright outdoor lighting.  Clear any walking paths of anything that might make someone trip, such as  rocks or tools.  Regularly check to see if handrails are loose or broken. Make sure that both sides of any steps have handrails.  Any raised decks and porches should have guardrails on the edges.  Have any leaves, snow, or ice cleared regularly.  Use sand or salt on walking paths during winter.  Clean up any spills in your garage right away. This includes oil or grease spills. What can I do in the bathroom?  Use night lights.  Install grab bars by the toilet and in the tub and shower. Do not use towel bars as grab bars.  Use non-skid mats or decals in the tub or shower.  If you need to sit down in the shower, use a plastic, non-slip stool.  Keep the floor dry. Clean up any water that spills on the floor as soon as it happens.  Remove soap buildup in the tub or shower regularly.  Attach bath mats securely with double-sided non-slip rug tape.  Do not have throw rugs and other things on the floor that can make you trip. What can I do in the bedroom?  Use night lights.  Make sure that you have a light by your bed that is easy to reach.  Do not use any sheets or  blankets that are too big for your bed. They should not hang down onto the floor.  Have a firm chair that has side arms. You can use this for support while you get dressed.  Do not have throw rugs and other things on the floor that can make you trip. What can I do in the kitchen?  Clean up any spills right away.  Avoid walking on wet floors.  Keep items that you use a lot in easy-to-reach places.  If you need to reach something above you, use a strong step stool that has a grab bar.  Keep electrical cords out of the way.  Do not use floor polish or wax that makes floors slippery. If you must use wax, use non-skid floor wax.  Do not have throw rugs and other things on the floor that can make you trip. What can I do with my stairs?  Do not leave any items on the stairs.  Make sure that there are handrails on  both sides of the stairs and use them. Fix handrails that are broken or loose. Make sure that handrails are as long as the stairways.  Check any carpeting to make sure that it is firmly attached to the stairs. Fix any carpet that is loose or worn.  Avoid having throw rugs at the top or bottom of the stairs. If you do have throw rugs, attach them to the floor with carpet tape.  Make sure that you have a light switch at the top of the stairs and the bottom of the stairs. If you do not have them, ask someone to add them for you. What else can I do to help prevent falls?  Wear shoes that:  Do not have high heels.  Have rubber bottoms.  Are comfortable and fit you well.  Are closed at the toe. Do not wear sandals.  If you use a stepladder:  Make sure that it is fully opened. Do not climb a closed stepladder.  Make sure that both sides of the stepladder are locked into place.  Ask someone to hold it for you, if possible.  Clearly mark and make sure that you can see:  Any grab bars or handrails.  First and last steps.  Where the edge of each step is.  Use tools that help you move around (mobility aids) if they are needed. These include:  Canes.  Walkers.  Scooters.  Crutches.  Turn on the lights when you go into a dark area. Replace any light bulbs as soon as they burn out.  Set up your furniture so you have a clear path. Avoid moving your furniture around.  If any of your floors are uneven, fix them.  If there are any pets around you, be aware of where they are.  Review your medicines with your doctor. Some medicines can make you feel dizzy. This can increase your chance of falling. Ask your doctor what other things that you can do to help prevent falls. This information is not intended to replace advice given to you by your health care provider. Make sure you discuss any questions you have with your health care provider. Document Released: 08/29/2009 Document  Revised: 04/09/2016 Document Reviewed: 12/07/2014 Elsevier Interactive Patient Education  2017 [employer]. Please call [phone] to schedule your mammogram.

## 2019-06-22 NOTE — Telephone Encounter (Signed)
Please fax most recent OV note to:   [facility] AttnAndee Poles [phone]

## 2019-06-22 NOTE — Telephone Encounter (Signed)
Please send current note when she is seen by Dr.Crissman on 06/28/19

## 2019-06-28 ENCOUNTER — Other Ambulatory Visit: Payer: Self-pay

## 2019-06-28 ENCOUNTER — Encounter: Payer: Self-pay | Admitting: Family Medicine

## 2019-06-28 ENCOUNTER — Ambulatory Visit (INDEPENDENT_AMBULATORY_CARE_PROVIDER_SITE_OTHER): Payer: Medicare Other | Admitting: Family Medicine

## 2019-06-28 DIAGNOSIS — E7800 Pure hypercholesterolemia, unspecified: Secondary | ICD-10-CM

## 2019-06-28 DIAGNOSIS — I10 Essential (primary) hypertension: Secondary | ICD-10-CM | POA: Diagnosis not present

## 2019-06-28 DIAGNOSIS — F339 Major depressive disorder, recurrent, unspecified: Secondary | ICD-10-CM | POA: Diagnosis not present

## 2019-06-28 DIAGNOSIS — Z7189 Other specified counseling: Secondary | ICD-10-CM | POA: Diagnosis not present

## 2019-06-28 DIAGNOSIS — G801 Spastic diplegic cerebral palsy: Secondary | ICD-10-CM

## 2019-06-28 HISTORY — DX: Other specified counseling: Z71.89

## 2019-06-28 MED ORDER — MELOXICAM 15 MG PO TABS: 15.0000 mg | ORAL_TABLET | Freq: Every day | ORAL | 2 refills | Status: DC

## 2019-06-28 MED ORDER — BACLOFEN 10 MG PO TABS: 10.0000 mg | ORAL_TABLET | Freq: Two times a day (BID) | ORAL | 3 refills | Status: DC

## 2019-06-28 MED ORDER — HYDROCHLOROTHIAZIDE 12.5 MG PO TABS: 12.5000 mg | ORAL_TABLET | Freq: Every day | ORAL | 4 refills | Status: DC

## 2019-06-28 MED ORDER — FLUOXETINE HCL 20 MG PO CAPS: 20.0000 mg | ORAL_CAPSULE | Freq: Every day | ORAL | 4 refills | Status: DC

## 2019-06-28 MED ORDER — SIMVASTATIN 20 MG PO TABS: 20.0000 mg | ORAL_TABLET | Freq: Every day | ORAL | 4 refills | Status: DC

## 2019-06-28 NOTE — Assessment & Plan Note (Signed)
The current medical regimen is effective;  continue present plan and medications.  

## 2019-06-28 NOTE — Telephone Encounter (Signed)
OV note faxed as requested.

## 2019-06-28 NOTE — Progress Notes (Addendum)
There were no vitals taken for this visit.   Subjective:    Patient ID: Michelle Chandler, female    DOB: 10-30-1956, 63 y.o.   MRN: [id number]  HPI: Michelle Chandler is a 63 y.o. female  Med check Discussed with patient all in all doing well except for some spasticity from cerebral palsy which baclofen seems to help wondering about increasing dosages.  Patient taking 10 mg once a day.  Not having problems with drowsiness reviewed medication usage and tablets. Blood pressure doing well no complaints. Cholesterol doing well no complaints. Has help with home health coming out with bathing etc.  Relevant past medical, surgical, family and social history reviewed and updated as indicated. Interim medical history since our last visit reviewed. Allergies and medications reviewed and updated.  Review of Systems  Constitutional: Negative.   HENT: Negative.   Eyes: Negative.   Respiratory: Negative.   Cardiovascular: Negative.   Gastrointestinal: Negative.   Endocrine: Negative.   Genitourinary: Negative.   Musculoskeletal: Negative.   Skin: Negative.   Allergic/Immunologic: Negative.   Neurological: Negative.   Hematological: Negative.   Psychiatric/Behavioral: Negative.     Per HPI unless specifically indicated above     Objective:    There were no vitals taken for this visit.  Wt Readings from Last 3 Encounters:  06/21/19 155 lb (70.3 kg)  09/23/18 160 lb (72.6 kg)  09/10/18 160 lb (72.6 kg)    Physical Exam  Results for orders placed or performed during the hospital encounter of 09/23/18  Urinalysis, Complete w Microscopic  Result Value Ref Range   Color, Urine YELLOW (A) YELLOW   APPearance CLEAR (A) CLEAR   Specific Gravity, Urine 1.014 1.005 - 1.030   pH 6.0 5.0 - 8.0   Glucose, UA NEGATIVE NEGATIVE mg/dL   Hgb urine dipstick SMALL (A) NEGATIVE   Bilirubin Urine NEGATIVE NEGATIVE   Ketones, ur NEGATIVE NEGATIVE mg/dL   Protein, ur NEGATIVE NEGATIVE mg/dL   Nitrite  NEGATIVE NEGATIVE   Leukocytes, UA NEGATIVE NEGATIVE   RBC / HPF 6-10 0 - 5 RBC/hpf   WBC, UA 0-5 0 - 5 WBC/hpf   Bacteria, UA RARE (A) NONE SEEN   Squamous Epithelial / LPF 0-5 0 - 5   Mucus PRESENT   CBC with Differential  Result Value Ref Range   WBC 9.4 4.0 - 10.5 K/uL   RBC 4.47 3.87 - 5.11 MIL/uL   Hemoglobin 14.2 12.0 - 15.0 g/dL   HCT 43.5 36.0 - 46.0 %   MCV 97.3 80.0 - 100.0 fL   MCH 31.8 26.0 - 34.0 pg   MCHC 32.6 30.0 - 36.0 g/dL   RDW 12.0 11.5 - 15.5 %   Platelets 372 150 - 400 K/uL   nRBC 0.0 0.0 - 0.2 %   Neutrophils Relative % 68 %   Neutro Abs 6.4 1.7 - 7.7 K/uL   Lymphocytes Relative 22 %   Lymphs Abs 2.1 0.7 - 4.0 K/uL   Monocytes Relative 7 %   Monocytes Absolute 0.7 0.1 - 1.0 K/uL   Eosinophils Relative 2 %   Eosinophils Absolute 0.2 0.0 - 0.5 K/uL   Basophils Relative 1 %   Basophils Absolute 0.1 0.0 - 0.1 K/uL   Immature Granulocytes 0 %   Abs Immature Granulocytes 0.02 0.00 - 0.07 K/uL  Basic metabolic panel  Result Value Ref Range   Sodium 138 135 - 145 mmol/L   Potassium 3.6 3.5 - 5.1 mmol/L   Chloride  104 98 - 111 mmol/L   CO2 25 22 - 32 mmol/L   Glucose, Bld 96 70 - 99 mg/dL   BUN 12 6 - 20 mg/dL   Creatinine, Ser 0.65 0.44 - 1.00 mg/dL   Calcium 9.3 8.9 - 10.3 mg/dL   GFR calc non Af Amer >60 >60 mL/min   GFR calc Af Amer >60 >60 mL/min   Anion gap 9 5 - 15      Assessment & Plan:   Problem List Items Addressed This Visit      Cardiovascular and Mediastinum   Essential (primary) hypertension    The current medical regimen is effective;  continue present plan and medications.       Relevant Medications   simvastatin (ZOCOR) 20 MG tablet   hydrochlorothiazide (HYDRODIURIL) 12.5 MG tablet   Other Relevant Orders   Comprehensive metabolic panel   CBC with Differential/Platelet   TSH   Urinalysis, Routine w reflex microscopic     Nervous and Auditory   Cerebral palsy ([place])    Doing well wants more baclofen Discussed use of  baclofen will try splitting 10 mg taking twice daily instead of once a day to see if that helps may take up to 10 mg twice a day to see if that helps.      Relevant Medications   meloxicam (MOBIC) 15 MG tablet   baclofen (LIORESAL) 10 MG tablet     Other   Hyperlipidemia    The current medical regimen is effective;  continue present plan and medications.       Relevant Medications   simvastatin (ZOCOR) 20 MG tablet   hydrochlorothiazide (HYDRODIURIL) 12.5 MG tablet   Other Relevant Orders   Comprehensive metabolic panel   Lipid panel   CBC with Differential/Platelet   TSH   Urinalysis, Routine w reflex microscopic   Depression, recurrent (HCC)    The current medical regimen is effective;  continue present plan and medications.       Relevant Medications   FLUoxetine (PROZAC) 20 MG capsule   Other Relevant Orders   TSH   Advance care planning    A voluntary discussion about advanced care planning including explanation and discussion of advanced directives was extentively discussed with the patient.  Explained about the healthcare proxy and living will was reviewed and packet with forms with expiration of how to fill them out was given.  Time spent: Encounter 16+ min individuals present: Patient       Other Visit Diagnoses    Essential hypertension       Relevant Medications   simvastatin (ZOCOR) 20 MG tablet   hydrochlorothiazide (HYDRODIURIL) 12.5 MG tablet   Other Relevant Orders   Comprehensive metabolic panel      Telemedicine using audio/video telecommunications for a synchronous communication visit. Today's visit due to COVID-19 isolation precautions I connected with and verified that I am speaking with the correct person using two identifiers.   I discussed the limitations, risks, security and privacy concerns of performing an evaluation and management service by telecommunication and the availability of in person appointments. I also discussed with the patient  that there may be a patient responsible charge related to this service. The patient expressed understanding and agreed to proceed. The patient's location is home. I am at home.   I discussed the assessment and treatment plan with the patient. The patient was provided an opportunity to ask questions and all were answered. The patient agreed with the plan and  demonstrated an understanding of the instructions.   The patient was advised to call back or seek an in-person evaluation if the symptoms worsen or if the condition fails to improve as anticipated.   I provided 21+ minutes of time during this encounter. Follow up plan: Return in about 6 months (around 12/29/2019) for And person hands-on physical and, BMP,  Lipids, ALT, AST.

## 2019-06-28 NOTE — Assessment & Plan Note (Signed)
A voluntary discussion about advanced care planning including explanation and discussion of advanced directives was extentively discussed with the patient.  Explained about the healthcare proxy and living will was reviewed and packet with forms with expiration of how to fill them out was given.  Time spent: Encounter 16+ min individuals present: Patient 

## 2019-06-28 NOTE — Assessment & Plan Note (Addendum)
Doing well wants more baclofen Discussed use of baclofen will try splitting 10 mg taking twice daily instead of once a day to see if that helps may take up to 10 mg twice a day to see if that helps.

## 2019-07-17 ENCOUNTER — Other Ambulatory Visit: Payer: Self-pay | Admitting: Family Medicine

## 2019-07-18 NOTE — Telephone Encounter (Signed)
Routing to provider  

## 2019-08-17 ENCOUNTER — Telehealth: Payer: Self-pay | Admitting: Family Medicine

## 2019-08-17 NOTE — Chronic Care Management (AMB) (Signed)
Chronic Care Management   Note  08/17/2019 Name: Michelle Chandler MRN: [id number] DOB: [birth year]  [name] is a 63 y.o. year old female who is a primary care patient of Crissman, Jeannette How, MD. I reached out to [name] by phone today in response to a referral sent by [name]'s health plan.     [name] was given information about Chronic Care Management services today including:  1. CCM service includes personalized support from designated clinical staff supervised by her physician, including individualized plan of care and coordination with other care providers 2. 24/7 contact phone numbers for assistance for urgent and routine care needs. 3. Service will only be billed when office clinical staff spend 20 minutes or more in a month to coordinate care. 4. Only one practitioner may furnish and bill the service in a calendar month. 5. The patient may stop CCM services at any time (effective at the end of the month) by phone call to the office staff. 6. The patient will be responsible for cost sharing (co-pay) of up to 20% of the service fee (after annual deductible is met).  Patient agreed to services and verbal consent obtained.   Follow up plan: Telephone appointment with CCM team member scheduled for: 09/19/2019  [name]  [email or web address]   ??[phone]

## 2019-09-19 ENCOUNTER — Telehealth: Payer: Medicare Other

## 2019-09-20 DIAGNOSIS — Z23 Encounter for immunization: Secondary | ICD-10-CM | POA: Diagnosis not present

## 2019-10-16 ENCOUNTER — Other Ambulatory Visit: Payer: Self-pay | Admitting: Family Medicine

## 2019-10-16 NOTE — Telephone Encounter (Signed)
Patient last seen in August 2020 and has follow up in March 2021.

## 2019-10-16 NOTE — Telephone Encounter (Signed)
Needs appointment

## 2019-10-16 NOTE — Telephone Encounter (Signed)
Patient calling back to check status of this medication refill. Patient states she is out.

## 2019-10-19 ENCOUNTER — Other Ambulatory Visit: Payer: Self-pay

## 2019-10-19 ENCOUNTER — Ambulatory Visit (INDEPENDENT_AMBULATORY_CARE_PROVIDER_SITE_OTHER): Payer: Medicare Other | Admitting: Family Medicine

## 2019-10-19 ENCOUNTER — Encounter: Payer: Self-pay | Admitting: Family Medicine

## 2019-10-19 DIAGNOSIS — F339 Major depressive disorder, recurrent, unspecified: Secondary | ICD-10-CM | POA: Diagnosis not present

## 2019-10-19 DIAGNOSIS — I10 Essential (primary) hypertension: Secondary | ICD-10-CM

## 2019-10-19 DIAGNOSIS — E785 Hyperlipidemia, unspecified: Secondary | ICD-10-CM

## 2019-10-19 MED ORDER — HYDROCHLOROTHIAZIDE 25 MG PO TABS: 25.0000 mg | ORAL_TABLET | Freq: Every day | ORAL | 1 refills | Status: DC

## 2019-10-19 MED ORDER — LORAZEPAM 1 MG PO TABS: ORAL_TABLET | ORAL | 1 refills | Status: DC

## 2019-10-19 NOTE — Assessment & Plan Note (Signed)
Not under good control. Will increase her HCTZ to 25mg  and recheck 1 month. Due for labs. Await results.

## 2019-10-19 NOTE — Progress Notes (Signed)
BP (!) 172/78   Pulse 82   Temp 98.4 F (36.9 C)    Subjective:    Patient ID: Michelle Chandler, female    DOB: 03-09-1956, 63 y.o.   MRN: [id number]  HPI: Michelle Chandler is a 63 y.o. female  Chief Complaint  Patient presents with  . Anxiety    Lorazepam  . Hypertension  . Hyperlipidemia   HYPERTENSION / HYPERLIPIDEMIA- has not taken her blood pressure pill today Satisfied with current treatment? yes Duration of hypertension: chronic BP monitoring frequency: not checking BP medication side effects: no Past BP meds: HCTZ Duration of hyperlipidemia: chronic Cholesterol medication side effects: no Cholesterol supplements: none Past cholesterol medications: simvastatin Medication compliance: excellent compliance Aspirin: no Recent stressors: yes Recurrent headaches: no Visual changes: no Palpitations: no Dyspnea: no Chest pain: no Lower extremity edema: no Dizzy/lightheaded: no  DEPRESSION/ANXIETY- takes her lorazepam a couple of times a month Mood status: controlled Satisfied with current treatment?: yes Symptom severity: mild  Duration of current treatment : chronic Side effects: no Medication compliance: excellent compliance Psychotherapy/counseling: no  Previous psychiatric medications: prozac and lorazepam Depressed mood: no Anxious mood: yes Anhedonia: no Significant weight loss or gain: no Insomnia: no  Fatigue: no Feelings of worthlessness or guilt: no Impaired concentration/indecisiveness: no Suicidal ideations: no Hopelessness: no Crying spells: no Depression screen [facility] 2/9 10/19/2019 06/21/2019 08/16/2018 01/28/2018 06/29/2017  Decreased Interest 0 0 0 1 0  Down, Depressed, Hopeless 1 0 1 1 0  PHQ - 2 Score 1 0 1 2 0  Altered sleeping 1 - 0 1 -  Tired, decreased energy 1 - 1 1 -  Change in appetite 0 - 2 1 -  Feeling bad or failure about yourself  1 - 1 1 -  Trouble concentrating 0 - 0 0 -  Moving slowly or fidgety/restless 1 - 0 1 -  Suicidal thoughts  0 - 0 0 -  PHQ-9 Score 5 - 5 7 -  Difficult doing work/chores Not difficult at all - - Not difficult at all -   GAD 7 : Generalized Anxiety Score 10/19/2019 10/19/2017  Nervous, Anxious, on Edge 1 3  Control/stop worrying 1 2  Worry too much - different things 1 3  Trouble relaxing 1 3  Restless 1 2  Easily annoyed or irritable 2 3  Afraid - awful might happen 0 3  Total GAD 7 Score 7 19  Anxiety Difficulty Not difficult at all Very difficult    Relevant past medical, surgical, family and social history reviewed and updated as indicated. Interim medical history since our last visit reviewed. Allergies and medications reviewed and updated.  Review of Systems  HENT: Positive for congestion. Negative for dental problem, drooling, ear discharge, ear pain, facial swelling, hearing loss, mouth sores, nosebleeds, postnasal drip, rhinorrhea, sinus pressure, sinus pain, sneezing, sore throat, tinnitus, trouble swallowing and voice change.     Per HPI unless specifically indicated above     Objective:    BP (!) 172/78   Pulse 82   Temp 98.4 F (36.9 C)   Wt Readings from Last 3 Encounters:  06/21/19 155 lb (70.3 kg)  09/23/18 160 lb (72.6 kg)  09/10/18 160 lb (72.6 kg)    Physical Exam Vitals signs and nursing note reviewed.  Pulmonary:     Effort: Pulmonary effort is normal. No respiratory distress.     Comments: Speaking in full sentences Neurological:     Mental Status: She is alert.  Psychiatric:  Mood and Affect: Mood normal.        Behavior: Behavior normal.        Thought Content: Thought content normal.        Judgment: Judgment normal.     Results for orders placed or performed during the hospital encounter of 09/23/18  Urinalysis, Complete w Microscopic  Result Value Ref Range   Color, Urine YELLOW (A) YELLOW   APPearance CLEAR (A) CLEAR   Specific Gravity, Urine 1.014 1.005 - 1.030   pH 6.0 5.0 - 8.0   Glucose, UA NEGATIVE NEGATIVE mg/dL   Hgb urine  dipstick SMALL (A) NEGATIVE   Bilirubin Urine NEGATIVE NEGATIVE   Ketones, ur NEGATIVE NEGATIVE mg/dL   Protein, ur NEGATIVE NEGATIVE mg/dL   Nitrite NEGATIVE NEGATIVE   Leukocytes, UA NEGATIVE NEGATIVE   RBC / HPF 6-10 0 - 5 RBC/hpf   WBC, UA 0-5 0 - 5 WBC/hpf   Bacteria, UA RARE (A) NONE SEEN   Squamous Epithelial / LPF 0-5 0 - 5   Mucus PRESENT   CBC with Differential  Result Value Ref Range   WBC 9.4 4.0 - 10.5 K/uL   RBC 4.47 3.87 - 5.11 MIL/uL   Hemoglobin 14.2 12.0 - 15.0 g/dL   HCT 43.5 36.0 - 46.0 %   MCV 97.3 80.0 - 100.0 fL   MCH 31.8 26.0 - 34.0 pg   MCHC 32.6 30.0 - 36.0 g/dL   RDW 12.0 11.5 - 15.5 %   Platelets 372 150 - 400 K/uL   nRBC 0.0 0.0 - 0.2 %   Neutrophils Relative % 68 %   Neutro Abs 6.4 1.7 - 7.7 K/uL   Lymphocytes Relative 22 %   Lymphs Abs 2.1 0.7 - 4.0 K/uL   Monocytes Relative 7 %   Monocytes Absolute 0.7 0.1 - 1.0 K/uL   Eosinophils Relative 2 %   Eosinophils Absolute 0.2 0.0 - 0.5 K/uL   Basophils Relative 1 %   Basophils Absolute 0.1 0.0 - 0.1 K/uL   Immature Granulocytes 0 %   Abs Immature Granulocytes 0.02 0.00 - 0.07 K/uL  Basic metabolic panel  Result Value Ref Range   Sodium 138 135 - 145 mmol/L   Potassium 3.6 3.5 - 5.1 mmol/L   Chloride 104 98 - 111 mmol/L   CO2 25 22 - 32 mmol/L   Glucose, Bld 96 70 - 99 mg/dL   BUN 12 6 - 20 mg/dL   Creatinine, Ser 0.65 0.44 - 1.00 mg/dL   Calcium 9.3 8.9 - 10.3 mg/dL   GFR calc non Af Amer >60 >60 mL/min   GFR calc Af Amer >60 >60 mL/min   Anion gap 9 5 - 15      Assessment & Plan:   Problem List Items Addressed This Visit      Cardiovascular and Mediastinum   Essential (primary) hypertension    Not under good control. Will increase her HCTZ to 25mg  and recheck 1 month. Due for labs. Await results.         Other   Hyperlipidemia    Under good control on current regimen. Continue current regimen. Continue to monitor. Call with any concerns. Refills up to date. Labs to be drawn  ASAP       Depression, recurrent ([place])    Under good control on current regimen. Continue current regimen. Continue to monitor. Call with any concerns. Refills given. Lorazepam used very occasionally. Refills for 60 pills given today- should last 6 months.  PMP reviewed today.        Other Visit Diagnoses    Essential hypertension           Follow up plan: Return in about 4 weeks (around 11/16/2019) for BP follow up.   . This visit was completed via telephone due to the restrictions of the COVID-19 pandemic. All issues as above were discussed and addressed but no physical exam was performed. If it was felt that the patient should be evaluated in the office, they were directed there. The patient verbally consented to this visit. Patient was unable to complete an audio/visual visit due to Lack of equipment. Due to the catastrophic nature of the COVID-19 pandemic, this visit was done through audio contact only. . Location of the patient: home . Location of the provider: work . Those involved with this call:  . Provider: Park Liter, DO . CMA: Tiffany Reel, CMA . Front Desk/Registration: Don Perking  . Time spent on call: 25 minutes on the phone discussing health concerns. 40 minutes total spent in review of patient's record and preparation of their chart.

## 2019-10-19 NOTE — Assessment & Plan Note (Signed)
Under good control on current regimen. Continue current regimen. Continue to monitor. Call with any concerns. Refills given. Lorazepam used very occasionally. Refills for 60 pills given today- should last 6 months. PMP reviewed today.

## 2019-10-19 NOTE — Assessment & Plan Note (Addendum)
Under good control on current regimen. Continue current regimen. Continue to monitor. Call with any concerns. Refills up to date. Labs to be drawn ASAP

## 2019-10-27 ENCOUNTER — Telehealth: Payer: Medicare Other

## 2019-11-21 ENCOUNTER — Telehealth: Payer: Self-pay | Admitting: Family Medicine

## 2019-11-21 NOTE — Chronic Care Management (AMB) (Signed)
°  Care Management   Note  11/21/2019 Name: Michelle Chandler MRN: [id number] DOB: 05-12-1956  Michelle Chandler is a 64 y.o. year old female who is a primary care patient of Crissman, Jeannette How, MD and is actively engaged with the care management team. I reached out to [name] by phone today to assist with re-scheduling a initial outreach appointment with the RN Case Manager  Follow up plan: Telephone appointment with care management team member scheduled for: 12/29/2019  [place], [facility]  [place], [place] [address] Direct Dial: [email or web address]  Website: [email or web address]

## 2019-11-22 ENCOUNTER — Telehealth: Payer: Self-pay

## 2019-12-29 ENCOUNTER — Telehealth: Payer: Self-pay

## 2019-12-29 ENCOUNTER — Ambulatory Visit (INDEPENDENT_AMBULATORY_CARE_PROVIDER_SITE_OTHER): Payer: Medicare Other | Admitting: General Practice

## 2019-12-29 ENCOUNTER — Encounter: Payer: Self-pay | Admitting: General Practice

## 2019-12-29 DIAGNOSIS — F339 Major depressive disorder, recurrent, unspecified: Secondary | ICD-10-CM

## 2019-12-29 DIAGNOSIS — I10 Essential (primary) hypertension: Secondary | ICD-10-CM

## 2019-12-29 DIAGNOSIS — E785 Hyperlipidemia, unspecified: Secondary | ICD-10-CM

## 2019-12-29 DIAGNOSIS — G801 Spastic diplegic cerebral palsy: Secondary | ICD-10-CM

## 2019-12-29 NOTE — Chronic Care Management (AMB) (Signed)
Chronic Care Management   Initial Visit Note  12/29/2019 Name: Michelle Chandler MRN: [id number] DOB: [birth year]  Referred by: Michelle Maple, MD Reason for referral : Chronic Care Management (Initial outreach HTN/CP/HLD/Depression)   Michelle Chandler is a 64 y.o. year old female who is a primary care patient of Crissman, Michelle How, MD. The CCM team was consulted for assistance with chronic disease management and care coordination needs related to HTN, HLD, Depression and Cerebral Palsy  Review of patient status, including review of consultants reports, relevant laboratory and other test results, and collaboration with appropriate care team members and the patient's provider was performed as part of comprehensive patient evaluation and provision of chronic care management services.    SDOH (Social Determinants of Health) screening performed today: Biomedical engineer  Food Insecurity  Depression   Alcohol/Substance Use Tobacco Use Stress Physical Activity. See Care Plan for related entries.   Medications: Outpatient Encounter Medications as of 12/29/2019  Medication Sig  . aspirin EC 81 MG tablet Take 81 mg by mouth daily.  . baclofen (LIORESAL) 10 MG tablet Take 1 tablet (10 mg total) by mouth 2 (two) times daily.  . cyclobenzaprine (FLEXERIL) 5 MG tablet Take 1 tablet (5 mg total) by mouth 3 (three) times daily as needed for muscle spasms.  . diclofenac Sodium (VOLTAREN) 1 % GEL Voltaren 1 % topical gel  . FLUoxetine (PROZAC) 20 MG capsule Take 1 capsule (20 mg total) by mouth daily.  . hydrochlorothiazide (HYDRODIURIL) 25 MG tablet Take 1 tablet (25 mg total) by mouth daily.  . Incontinence Supply Disposable (ENTRUST PLUS DISP UNDERPADS) MISC by Does not apply route.  . Incontinence Supply Disposable (PROTECTIVE UNDERWEAR LARGE) MISC by Does not apply route.  [name] LORazepam (ATIVAN) 1 MG tablet TAKE ONE TABLET DAILY AS NEEDED FOR ANXIETY.  . meloxicam (MOBIC) 15 MG tablet Take 1  tablet (15 mg total) by mouth daily.  . simvastatin (ZOCOR) 20 MG tablet Take 1 tablet (20 mg total) by mouth daily.   No facility-administered encounter medications on file as of 12/29/2019.     Objective:  BP Readings from Last 3 Encounters:  10/19/19 (!) 172/78  09/23/18 (!) 151/95  09/22/18 130/77    Goals Addressed            This Visit's Progress   . RNCM: "I eat regular food" (pt-stated)       Current Barriers:  . Chronic Disease Management support, education, and care coordination needs related to HTN, HLD, Depression, and Cerebral Palsy  Clinical Goal(s) related to HTN, HLD, Depression, and Cerebral Palsy :  Over the next 120 days, patient will:  . Work with the care management team to address educational, disease management, and care coordination needs  . Begin or continue self health monitoring activities as directed today Measure and record blood pressure 4 times per week . Call provider office for new or worsened signs and symptoms Blood pressure findings outside established parameters and New or worsened symptom related to HLD, Depression or Cerebral Palsy . Call care management team with questions or concerns . Verbalize basic understanding of patient centered plan of care established today  Interventions related to HTN, HLD, Depression, and Cerebral Palsy :  . Evaluation of current treatment plans and patient's adherence to plan as established by provider . Assessed patient understanding of disease states . Assessed patient's education and care coordination needs . Provided disease specific education to patient  . Collaborated with appropriate clinical care  team members regarding patient needs  Patient Self Care Activities related to HTN, HLD, Depression, and Cerebral Palsy :  . Patient is unable to independently self-manage chronic health conditions  Initial goal documentation         [name] was given information about Chronic Care Management services  today including:  1. CCM service includes personalized support from designated clinical staff supervised by her physician, including individualized plan of care and coordination with other care providers 2. 24/7 contact phone numbers for assistance for urgent and routine care needs. 3. Service will only be billed when office clinical staff spend 20 minutes or more in a month to coordinate care. 4. Only one practitioner may furnish and bill the service in a calendar month. 5. The patient may stop CCM services at any time (effective at the end of the month) by phone call to the office staff. 6. The patient will be responsible for cost sharing (co-pay) of up to 20% of the service fee (after annual deductible is met).  Patient agreed to services and verbal consent obtained.   Plan:   Telephone follow up appointment with care management team member scheduled for:02-21-2020 at 54 am  [place], MSN, [facility] Mobile: [phone]

## 2019-12-29 NOTE — Patient Instructions (Signed)
Visit Information  Goals Addressed            This Visit's Progress   . RNCM: "I eat regular food" (pt-stated)       Current Barriers:  . Chronic Disease Management support, education, and care coordination needs related to HTN, HLD, Depression, and Cerebral Palsy  Clinical Goal(s) related to HTN, HLD, Depression, and Cerebral Palsy :  Over the next 120 days, patient will:  . Work with the care management team to address educational, disease management, and care coordination needs  . Begin or continue self health monitoring activities as directed today Measure and record blood pressure 4 times per week . Call provider office for new or worsened signs and symptoms Blood pressure findings outside established parameters and New or worsened symptom related to HLD, Depression or Cerebral Palsy . Call care management team with questions or concerns . Verbalize basic understanding of patient centered plan of care established today  Interventions related to HTN, HLD, Depression, and Cerebral Palsy :  . Evaluation of current treatment plans and patient's adherence to plan as established by provider . Assessed patient understanding of disease states . Assessed patient's education and care coordination needs . Provided disease specific education to patient  . Collaborated with appropriate clinical care team members regarding patient needs  Patient Self Care Activities related to HTN, HLD, Depression, and Cerebral Palsy :  . Patient is unable to independently self-manage chronic health conditions  Initial goal documentation        Ms. Junkins was given information about Chronic Care Management services today including:  1. CCM service includes personalized support from designated clinical staff supervised by her physician, including individualized plan of care and coordination with other care providers 2. 24/7 contact phone numbers for assistance for urgent and routine care needs. 3. Service  will only be billed when office clinical staff spend 20 minutes or more in a month to coordinate care. 4. Only one practitioner may furnish and bill the service in a calendar month. 5. The patient may stop CCM services at any time (effective at the end of the month) by phone call to the office staff. 6. The patient will be responsible for cost sharing (co-pay) of up to 20% of the service fee (after annual deductible is met).  Patient agreed to services and verbal consent obtained.   The patient verbalized understanding of instructions provided today and declined a print copy of patient instruction materials.   Telephone follow up appointment with care management team member scheduled for:02-21-2020 at 30 am  [place], MSN, [facility] Mobile: [phone]

## 2020-01-16 ENCOUNTER — Other Ambulatory Visit: Payer: Self-pay | Admitting: Family Medicine

## 2020-01-16 DIAGNOSIS — I10 Essential (primary) hypertension: Secondary | ICD-10-CM

## 2020-01-16 NOTE — Telephone Encounter (Signed)
Requested Prescriptions  Pending Prescriptions Disp Refills  . hydrochlorothiazide (HYDRODIURIL) 25 MG tablet [Pharmacy Med Name: HYDROCHLOROTHIAZIDE 25 MG TAB] 30 tablet 0    Sig: Take 1 tablet (25 mg total) by mouth daily.     Cardiovascular: Diuretics - Thiazide Failed - 01/16/2020  1:12 PM      Failed - Ca in normal range and within 360 days    Calcium  Date Value Ref Range Status  09/23/2018 9.3 8.9 - 10.3 mg/dL Final         Failed - Cr in normal range and within 360 days    Creatinine, Ser  Date Value Ref Range Status  09/23/2018 0.65 0.44 - 1.00 mg/dL Final         Failed - K in normal range and within 360 days    Potassium  Date Value Ref Range Status  09/23/2018 3.6 3.5 - 5.1 mmol/L Final         Failed - Na in normal range and within 360 days    Sodium  Date Value Ref Range Status  09/23/2018 138 135 - 145 mmol/L Final  08/16/2018 138 134 - 144 mmol/L Final         Failed - Last BP in normal range    BP Readings from Last 1 Encounters:  10/19/19 (!) 172/78         Passed - Valid encounter within last 6 months    Recent Outpatient Visits          2 months ago Essential hypertension   [place], Megan P, DO   6 months ago Pure hypercholesterolemia   [facility] Crissman, Jeannette How, MD   1 year ago Spastic diplegic cerebral palsy [facility])   [facility] Volney American, PA-C   1 year ago Essential hypertension   [place], Jeannette How, MD   1 year ago Essential hypertension   [place], Jeannette How, MD      Future Appointments            In 2 weeks Cannady, Barbaraann Faster, NP [employer], [facility]

## 2020-02-05 ENCOUNTER — Other Ambulatory Visit: Payer: Self-pay

## 2020-02-05 ENCOUNTER — Ambulatory Visit (INDEPENDENT_AMBULATORY_CARE_PROVIDER_SITE_OTHER): Payer: Medicare Other | Admitting: Nurse Practitioner

## 2020-02-05 ENCOUNTER — Encounter: Payer: Self-pay | Admitting: Nurse Practitioner

## 2020-02-05 VITALS — BP 112/74 | HR 71 | Temp 98.4°F | Ht 60.0 in

## 2020-02-05 DIAGNOSIS — Z0000 Encounter for general adult medical examination without abnormal findings: Secondary | ICD-10-CM | POA: Diagnosis not present

## 2020-02-05 DIAGNOSIS — Z1211 Encounter for screening for malignant neoplasm of colon: Secondary | ICD-10-CM

## 2020-02-05 DIAGNOSIS — R5383 Other fatigue: Secondary | ICD-10-CM

## 2020-02-05 DIAGNOSIS — G801 Spastic diplegic cerebral palsy: Secondary | ICD-10-CM

## 2020-02-05 DIAGNOSIS — F339 Major depressive disorder, recurrent, unspecified: Secondary | ICD-10-CM | POA: Diagnosis not present

## 2020-02-05 DIAGNOSIS — I10 Essential (primary) hypertension: Secondary | ICD-10-CM | POA: Diagnosis not present

## 2020-02-05 DIAGNOSIS — E785 Hyperlipidemia, unspecified: Secondary | ICD-10-CM

## 2020-02-05 DIAGNOSIS — Z1231 Encounter for screening mammogram for malignant neoplasm of breast: Secondary | ICD-10-CM

## 2020-02-05 HISTORY — DX: Other fatigue: R53.83

## 2020-02-05 MED ORDER — HYDROCHLOROTHIAZIDE 25 MG PO TABS: 25.0000 mg | ORAL_TABLET | Freq: Every day | ORAL | 0 refills | Status: DC

## 2020-02-05 MED ORDER — MELOXICAM 15 MG PO TABS: 15.0000 mg | ORAL_TABLET | Freq: Every day | ORAL | 2 refills | Status: DC

## 2020-02-05 MED ORDER — IBUPROFEN 400 MG PO TABS: 400.0000 mg | ORAL_TABLET | Freq: Three times a day (TID) | ORAL | 0 refills | Status: DC | PRN

## 2020-02-05 MED ORDER — LORAZEPAM 1 MG PO TABS: ORAL_TABLET | ORAL | 0 refills | Status: DC

## 2020-02-05 MED ORDER — FLUOXETINE HCL 10 MG PO TABS: 10.0000 mg | ORAL_TABLET | Freq: Every day | ORAL | 0 refills | Status: DC

## 2020-02-05 NOTE — Assessment & Plan Note (Signed)
Acute, ongoing.  Reports more fatigue than normal.  Will check CBC and TSH today.

## 2020-02-05 NOTE — Assessment & Plan Note (Addendum)
Chronic, stable.  Continue HCTZ 25, refill given.  Labs checked today.  Encouraged to monitor BP at home and notify office if >140/90.

## 2020-02-05 NOTE — Patient Instructions (Addendum)
Nice meeting you today Michelle Chandler!  We will call you with the results of your labs tomorrow.  You can call [facility] [phone] for your mammogram apppointment.  We are recommending the vaccine to everyone who has not had an allergic reaction to any of the components of the vaccine. If you have specific questions about the vaccine, please bring them up with your health care provider to discuss them.   We will likely not be getting the vaccine in the office for the first rounds of vaccinations. The way they are releasing the vaccines is going to be through the health systems (like [facility], [facility], [facility], [facility]), through your county health department, or through the pharmacies.   [facility] is giving vaccines to those 65+ and Health Care Workers Teachers and [place] providers start 01/10/20, Essential workers start 3/10 and those with co-morbidities start 02/07/20 Call [phone] to schedule  If you are 65+ you can get a vaccine through [facility] by signing up for an appointment.  You can sign up by going to: [email or web address].  You can get more information by going to: [email or web address]  [employer] next door is giving the [employer]- you can call [phone] or stop by there to schedule.     Preventive Care 62-36 Years Old, Female Preventive care refers to visits with your health care provider and lifestyle choices that can promote health and wellness. This includes:  A yearly physical exam. This may also be called an annual well check.  Regular dental visits and eye exams.  Immunizations.  Screening for certain conditions.  Healthy lifestyle choices, such as eating a healthy diet, getting regular exercise, not using drugs or products that contain nicotine and tobacco, and limiting alcohol use. What can I expect for my preventive care visit? Physical exam Your health care provider will check your:  Height and  weight. This may be used to calculate body mass index (BMI), which tells if you are at a healthy weight.  Heart rate and blood pressure.  Skin for abnormal spots. Counseling Your health care provider may ask you questions about your:  Alcohol, tobacco, and drug use.  Emotional well-being.  Home and relationship well-being.  Sexual activity.  Eating habits.  Work and work Statistician.  Method of birth control.  Menstrual cycle.  Pregnancy history. What immunizations do I need?  Influenza (flu) vaccine  This is recommended every year. Tetanus, diphtheria, and pertussis (Tdap) vaccine  You may need a Td booster every 10 years. Varicella (chickenpox) vaccine  You may need this if you have not been vaccinated. Zoster (shingles) vaccine  You may need this after age 62. Measles, mumps, and rubella (MMR) vaccine  You may need at least one dose of MMR if you were born in 1957 or later. You may also need a second dose. Pneumococcal conjugate (PCV13) vaccine  You may need this if you have certain conditions and were not previously vaccinated. Pneumococcal polysaccharide (PPSV23) vaccine  You may need one or two doses if you smoke cigarettes or if you have certain conditions. Meningococcal conjugate (MenACWY) vaccine  You may need this if you have certain conditions. Hepatitis A vaccine  You may need this if you have certain conditions or if you travel or work in places where you may be exposed to hepatitis A. Hepatitis B vaccine  You may need this if you have certain conditions or if you travel or work in places where you may be exposed to  hepatitis B. Haemophilus influenzae type b (Hib) vaccine  You may need this if you have certain conditions. Human papillomavirus (HPV) vaccine  If recommended by your health care provider, you may need three doses over 6 months. You may receive vaccines as individual doses or as more than one vaccine together in one shot  (combination vaccines). Talk with your health care provider about the risks and benefits of combination vaccines. What tests do I need? Blood tests  Lipid and cholesterol levels. These may be checked every 5 years, or more frequently if you are over 62 years old.  Hepatitis C test.  Hepatitis B test. Screening  Lung cancer screening. You may have this screening every year starting at age 62 if you have a 30-pack-year history of smoking and currently smoke or have quit within the past 15 years.  Colorectal cancer screening. All adults should have this screening starting at age 62 and continuing until age 62. Your health care provider may recommend screening at age 62 if you are at increased risk. You will have tests every 1-10 years, depending on your results and the type of screening test.  Diabetes screening. This is done by checking your blood sugar (glucose) after you have not eaten for a while (fasting). You may have this done every 1-3 years.  Mammogram. This may be done every 1-2 years. Talk with your health care provider about when you should start having regular mammograms. This may depend on whether you have a family history of breast cancer.  BRCA-related cancer screening. This may be done if you have a family history of breast, ovarian, tubal, or peritoneal cancers.  Pelvic exam and Pap test. This may be done every 3 years starting at age 62. Starting at age 62, this may be done every 5 years if you have a Pap test in combination with an HPV test. Other tests  Sexually transmitted disease (STD) testing.  Bone density scan. This is done to screen for osteoporosis. You may have this scan if you are at high risk for osteoporosis. Follow these instructions at home: Eating and drinking  Eat a diet that includes fresh fruits and vegetables, whole grains, lean protein, and low-fat dairy.  Take vitamin and mineral supplements as recommended by your health care provider.  Do not  drink alcohol if: ? Your health care provider tells you not to drink. ? You are pregnant, may be pregnant, or are planning to become pregnant.  If you drink alcohol: ? Limit how much you have to 0-1 drink a day. ? Be aware of how much alcohol is in your drink. In the [place] one drink equals one 12 oz bottle of beer (355 mL), one 5 oz glass of wine (148 mL), or one 1 oz glass of hard liquor (44 mL). Lifestyle  Take daily care of your teeth and gums.  Stay active. Exercise for at least 30 minutes on 5 or more days each week.  Do not use any products that contain nicotine or tobacco, such as cigarettes, e-cigarettes, and chewing tobacco. If you need help quitting, ask your health care provider.  If you are sexually active, practice safe sex. Use a condom or other form of birth control (contraception) in order to prevent pregnancy and STIs (sexually transmitted infections).  If told by your health care provider, take low-dose aspirin daily starting at age 46. What's next?  Visit your health care provider once a year for a well check visit.  Ask your health  care provider how often you should have your eyes and teeth checked.  Stay up to date on all vaccines. This information is not intended to replace advice given to you by your health care provider. Make sure you discuss any questions you have with your health care provider. Document Revised: 07/14/2018 Document Reviewed: 07/14/2018 Elsevier Patient Education  2020 [employer].

## 2020-02-05 NOTE — Assessment & Plan Note (Addendum)
Stable.  Continue mobic and baclofen, refills given.  Refill given for 400mg  Ibuprofen - uses very sparingly.

## 2020-02-05 NOTE — Assessment & Plan Note (Addendum)
Chronic, uncontrolled.  PHQ-9 elevated in office today.  Will increase fluoxetine to 30mg  daily and follow up in 4 weeks on mood.  Has had to increase use of lorazepam to help control symptoms.  Side effects discussed of increased use of benzodiazepine including physiological dependence, accidental overdose, and falls.  PDMP reviewed and refill given for lorazepam today - not to fill until 02/17/2020 and should last at least 2 months.  TSH and CBC checked today.

## 2020-02-05 NOTE — Progress Notes (Signed)
BP 112/74 (BP Location: Right Arm, Patient Position: Sitting, Cuff Size: Normal)   Pulse 71   Temp 98.4 F (36.9 C) (Oral)   Ht 5' (1.524 m)   SpO2 97%   BMI 30.27 kg/m    Subjective:    Patient ID: Michelle Chandler, female    DOB: 10/28/1956, 64 y.o.   MRN: [id number]  HPI: Michelle Chandler is a 64 y.o. female presenting on 02/05/2020 for comprehensive medical examination. Current medical complaints include:  DEPRESSION Mood status: uncontrolled Satisfied with current treatment?: yes Symptom severity: moderate  Duration of current treatment : chronic Side effects: no Medication compliance: excellent compliance Psychotherapy/counseling: no  Previous psychiatric medications: prozac Depressed mood: yes Anxious mood: yes Anhedonia: no Significant weight loss or gain: no Insomnia: no  Fatigue: yes Feelings of worthlessness or guilt: no Impaired concentration/indecisiveness: yes Suicidal ideations: no Hopelessness: no Crying spells: no Depression screen [facility] 2/9 02/05/2020 12/29/2019 10/19/2019 06/21/2019 08/16/2018  Decreased Interest 0 0 0 0 0  Down, Depressed, Hopeless 0 1 1 0 1  PHQ - 2 Score 0 1 1 0 1  Altered sleeping 2 0 1 - 0  Tired, decreased energy 0 1 1 - 1  Change in appetite 1 0 0 - 2  Feeling bad or failure about yourself  0 0 1 - 1  Trouble concentrating 1 0 0 - 0  Moving slowly or fidgety/restless 1 0 1 - 0  Suicidal thoughts 0 0 0 - 0  PHQ-9 Score 5 2 5  - 5  Difficult doing work/chores - - Not difficult at all - -   HYPERTENSION In December, dose of HCTZ was increased to 25mg  daily and was not able to f/u. Hypertension status: controlled  Satisfied with current treatment? yes Duration of hypertension: chronic BP monitoring frequency:  rarely BP range: does not remember numbers but states, "it goes up and down" BP medication side effects:  no Medication compliance: excellent compliance Previous BP meds:HCTZ Aspirin: yes Recurrent headaches: no Visual changes:  no Palpitations: no Dyspnea: no Chest pain: no Lower extremity edema: no Dizzy/lightheaded: no  She currently lives with: mother Menopausal Symptoms: no  Depression Screen done today and results listed below:  Depression screen [facility] 2/9 02/05/2020 12/29/2019 10/19/2019 06/21/2019 08/16/2018  Decreased Interest 0 0 0 0 0  Down, Depressed, Hopeless 0 1 1 0 1  PHQ - 2 Score 0 1 1 0 1  Altered sleeping 2 0 1 - 0  Tired, decreased energy 0 1 1 - 1  Change in appetite 1 0 0 - 2  Feeling bad or failure about yourself  0 0 1 - 1  Trouble concentrating 1 0 0 - 0  Moving slowly or fidgety/restless 1 0 1 - 0  Suicidal thoughts 0 0 0 - 0  PHQ-9 Score 5 2 5  - 5  Difficult doing work/chores - - Not difficult at all - -   The patient does not have a history of falls. I did not complete a risk assessment for falls. A plan of care for falls was not documented.   Past Medical History:  Past Medical History:  Diagnosis Date  . Allergic rhinitis   . Ataxia   . CP (cerebral palsy) ([place])   . Depression   . Diverticulitis   . Diverticulosis   . Hyperlipidemia   . Hypertension   . IFG (impaired fasting glucose)   . Venous insufficiency   . Venous stasis ulcer ([place])   . Vertigo  Surgical History:  Past Surgical History:  Procedure Laterality Date  . ABDOMINAL HYSTERECTOMY     complete  . LAPAROSCOPY    . legs and hip     due to CP    Medications:  Current Outpatient Medications on File Prior to Visit  Medication Sig  . aspirin EC 81 MG tablet Take 81 mg by mouth daily.  . baclofen (LIORESAL) 10 MG tablet Take 1 tablet (10 mg total) by mouth 2 (two) times daily.  . diclofenac Sodium (VOLTAREN) 1 % GEL Voltaren 1 % topical gel  . Incontinence Supply Disposable (ENTRUST PLUS DISP UNDERPADS) MISC by Does not apply route.  . Incontinence Supply Disposable (PROTECTIVE UNDERWEAR LARGE) MISC by Does not apply route.  . simvastatin (ZOCOR) 20 MG tablet Take 1 tablet (20 mg total) by mouth  daily.   No current facility-administered medications on file prior to visit.    Allergies:  Allergies  Allergen Reactions  . No Known Allergies     Social History:  Social History   Socioeconomic History  . Marital status: Single    Spouse name: Not on file  . Number of children: Not on file  . Years of education: Not on file  . Highest education level: High school graduate  Occupational History  . Occupation: disability   Tobacco Use  . Smoking status: Current Every Day Smoker    Packs/day: 0.25    Types: Cigarettes  . Smokeless tobacco: Never Used  Substance and Sexual Activity  . Alcohol use: No    Alcohol/week: 0.0 standard drinks  . Drug use: No  . Sexual activity: Never  Other Topics Concern  . Not on file  Social History Narrative  . Not on file   Social Determinants of Health   Financial Resource Strain: Low Risk   . Difficulty of Paying Living Expenses: Not hard at all  Food Insecurity: No Food Insecurity  . Worried About Charity fundraiser in the Last Year: Never true  . Ran Out of Food in the Last Year: Never true  Transportation Needs: No Transportation Needs  . Lack of Transportation (Medical): No  . Lack of Transportation (Non-Medical): No  Physical Activity:   . Days of Exercise per Week:   . Minutes of Exercise per Session:   Stress: No Stress Concern Present  . Feeling of Stress : Not at all  Social Connections:   . Frequency of Communication with Friends and Family:   . Frequency of Social Gatherings with Friends and Family:   . Attends Religious Services:   . Active Member of Clubs or Organizations:   . Attends Archivist Meetings:   [name] Marital Status:   Intimate Partner Violence:   . Fear of Current or Ex-Partner:   . Emotionally Abused:   [name] Physically Abused:   . Sexually Abused:    Social History   Tobacco Use  Smoking Status Current Every Day Smoker  . Packs/day: 0.25  . Types: Cigarettes  Smokeless Tobacco Never  Used   Social History   Substance and Sexual Activity  Alcohol Use No  . Alcohol/week: 0.0 standard drinks    Family History:  Family History  Problem Relation Age of Onset  . Diabetes Mother   . Diabetes Father   . Heart disease Maternal Uncle 46  . Kidney disease Maternal Uncle     Past medical history, surgical history, medications, allergies, family history and social history reviewed with patient today and changes made  to appropriate areas of the chart.   Review of Systems  Constitutional: Negative.  Negative for malaise/fatigue.  HENT: Negative.  Negative for congestion, hearing loss, sinus pain and sore throat.   Eyes: Negative.  Negative for blurred vision, double vision and discharge.  Respiratory: Negative.  Negative for cough, shortness of breath and wheezing.   Cardiovascular: Negative.  Negative for chest pain, palpitations and leg swelling.  Gastrointestinal: Negative.  Negative for abdominal pain, blood in stool, constipation, diarrhea, nausea and vomiting.  Genitourinary: Negative.  Negative for dysuria, frequency, hematuria and urgency.  Musculoskeletal: Negative.  Negative for back pain, falls, joint pain and myalgias.  Skin: Negative.   Neurological: Negative.  Negative for dizziness, weakness and headaches.  Psychiatric/Behavioral: Positive for depression. Negative for memory loss and suicidal ideas. The patient is nervous/anxious. The patient does not have insomnia.    All other ROS negative except what is listed above and in the HPI.      Objective:    BP 112/74 (BP Location: Right Arm, Patient Position: Sitting, Cuff Size: Normal)   Pulse 71   Temp 98.4 F (36.9 C) (Oral)   Ht 5' (1.524 m)   SpO2 97%   BMI 30.27 kg/m   Wt Readings from Last 3 Encounters:  06/21/19 155 lb (70.3 kg)  09/23/18 160 lb (72.6 kg)  09/10/18 160 lb (72.6 kg)    Physical Exam Vitals and nursing note reviewed.  Constitutional:      General: She is not in acute  distress.    Appearance: Normal appearance. She is normal weight. She is not ill-appearing or toxic-appearing.  HENT:     Head: Normocephalic and atraumatic.     Right Ear: Tympanic membrane, ear canal and external ear normal.     Left Ear: Tympanic membrane, ear canal and external ear normal.     Nose: Nose normal. No congestion or rhinorrhea.     Mouth/Throat:     Mouth: Mucous membranes are moist.     Pharynx: Oropharynx is clear. No oropharyngeal exudate.  Eyes:     General: No scleral icterus.    Extraocular Movements: Extraocular movements intact.     Pupils: Pupils are equal, round, and reactive to light.  Cardiovascular:     Rate and Rhythm: Normal rate and regular rhythm.     Pulses: Normal pulses.     Heart sounds: Normal heart sounds. No murmur.  Pulmonary:     Effort: Pulmonary effort is normal. No respiratory distress.     Breath sounds: No wheezing or rhonchi.  Abdominal:     General: Abdomen is flat. Bowel sounds are normal. There is no distension.     Palpations: Abdomen is soft.     Tenderness: There is no abdominal tenderness.  Musculoskeletal:        General: No swelling or tenderness. Normal range of motion.     Cervical back: Normal range of motion and neck supple. No rigidity or tenderness.     Right lower leg: No edema.     Left lower leg: No edema.  Skin:    General: Skin is warm and dry.     Capillary Refill: Capillary refill takes less than 2 seconds.     Coloration: Skin is not jaundiced or pale.  Neurological:     General: No focal deficit present.     Mental Status: She is alert and oriented to person, place, and time. Mental status is at baseline.     Motor: No  weakness.     Gait: Gait normal.  Psychiatric:        Mood and Affect: Mood normal.        Behavior: Behavior normal.        Thought Content: Thought content normal.        Judgment: Judgment normal.       Assessment & Plan:   Problem List Items Addressed This Visit       Cardiovascular and Mediastinum   Essential (primary) hypertension    Chronic, stable.  Continue HCTZ 25, refill given.  Labs checked today.  Encouraged to monitor BP at home and notify office if >140/90.      Relevant Medications   hydrochlorothiazide (HYDRODIURIL) 25 MG tablet   Other Relevant Orders   Comprehensive metabolic panel   CBC with Differential/Platelet     Nervous and Auditory   Cerebral palsy (HCC)    Stable.  Continue mobic and baclofen, refills given.  Refill given for 400mg  Ibuprofen - uses very sparingly.        Relevant Medications   meloxicam (MOBIC) 15 MG tablet   ibuprofen (ADVIL) 400 MG tablet     Other   Hyperlipidemia    Chronic, stable.  Continue simvastatin.  Lipids checked today.      Relevant Medications   hydrochlorothiazide (HYDRODIURIL) 25 MG tablet   Other Relevant Orders   Lipid Panel w/o Chol/HDL Ratio   Depression, recurrent (HCC) - Primary    Chronic, uncontrolled.  PHQ-9 elevated in office today.  Will increase fluoxetine to 30mg  daily and follow up in 4 weeks on mood.  Has had to increase use of lorazepam to help control symptoms.  Side effects discussed of increased use of benzodiazepine including physiological dependence, accidental overdose, and falls.  PDMP reviewed and refill given for lorazepam today - not to fill until 02/17/2020 and should last at least 2 months.  TSH and CBC checked today.      Relevant Medications   FLUoxetine (PROZAC) 10 MG tablet   LORazepam (ATIVAN) 1 MG tablet   Other Relevant Orders   TSH   CBC with Differential/Platelet   Fatigue    Acute, ongoing.  Reports more fatigue than normal.  Will check CBC and TSH today.      Relevant Orders   TSH    Other Visit Diagnoses    Encounter for annual physical exam       Encounter for screening mammogram for malignant neoplasm of breast       Relevant Orders   MM DIGITAL SCREENING BILATERAL   Colon cancer screening       Relevant Orders   Cologuard        Follow up plan: Return in about 4 weeks (around 03/04/2020) for mood f/u, can be virtual.   LABORATORY TESTING:  - Pap smear: not applicable  IMMUNIZATIONS:   - Tdap: Tetanus vaccination status reviewed: last tetanus booster within 10 years. - Influenza: Up to date - Pneumovax: Not applicable - Prevnar: Not applicable - HPV: Not applicable - Zostavax vaccine: Not applicable  SCREENING: -Mammogram: Ordered today  - Colonoscopy: Ordered today  - Bone Density: Not applicable  -Hearing Test: Not applicable  -Spirometry: Not applicable   PATIENT COUNSELING:   Advised to take 1 mg of folate supplement per day if capable of pregnancy.   Sexuality: Discussed sexually transmitted diseases, partner selection, use of condoms, avoidance of unintended pregnancy  and contraceptive alternatives.   Advised to avoid cigarette smoking.  I discussed with the patient that most people either abstain from alcohol or drink within safe limits (<=14/week and <=4 drinks/occasion for males, <=7/weeks and <= 3 drinks/occasion for females) and that the risk for alcohol disorders and other health effects rises proportionally with the number of drinks per week and how often a drinker exceeds daily limits.  Discussed cessation/primary prevention of drug use and availability of treatment for abuse.   Diet: Encouraged to adjust caloric intake to maintain  or achieve ideal body weight, to reduce intake of dietary saturated fat and total fat, to limit sodium intake by avoiding high sodium foods and not adding table salt, and to maintain adequate dietary potassium and calcium preferably from fresh fruits, vegetables, and low-fat dairy products.    stressed the importance of regular exercise  Injury prevention: Discussed safety belts, safety helmets, smoke detector, smoking near bedding or upholstery.   Dental health: Discussed importance of regular tooth brushing, flossing, and dental visits.    NEXT  PREVENTATIVE PHYSICAL DUE IN 1 YEAR. Return in about 4 weeks (around 03/04/2020) for mood f/u, can be virtual.

## 2020-02-05 NOTE — Assessment & Plan Note (Signed)
Chronic, stable.  Continue simvastatin.  Lipids checked today.

## 2020-02-06 ENCOUNTER — Telehealth: Payer: Self-pay | Admitting: Nurse Practitioner

## 2020-02-06 DIAGNOSIS — E7800 Pure hypercholesterolemia, unspecified: Secondary | ICD-10-CM

## 2020-02-06 LAB — CBC WITH DIFFERENTIAL/PLATELET
Basophils Absolute: 0.1 10*3/uL (ref 0.0–0.2)
Basos: 1 %
EOS (ABSOLUTE): 0.3 10*3/uL (ref 0.0–0.4)
Eos: 3 %
Hematocrit: 43.8 % (ref 34.0–46.6)
Hemoglobin: 14.6 g/dL (ref 11.1–15.9)
Immature Grans (Abs): 0 10*3/uL (ref 0.0–0.1)
Immature Granulocytes: 0 %
Lymphocytes Absolute: 1.8 10*3/uL (ref 0.7–3.1)
Lymphs: 21 %
MCH: 31 pg (ref 26.6–33.0)
MCHC: 33.3 g/dL (ref 31.5–35.7)
MCV: 93 fL (ref 79–97)
Monocytes Absolute: 0.5 10*3/uL (ref 0.1–0.9)
Monocytes: 5 %
Neutrophils Absolute: 6.1 10*3/uL (ref 1.4–7.0)
Neutrophils: 70 %
Platelets: 399 10*3/uL (ref 150–450)
RBC: 4.71 x10E6/uL (ref 3.77–5.28)
RDW: 12.1 % (ref 11.7–15.4)
WBC: 8.7 10*3/uL (ref 3.4–10.8)

## 2020-02-06 LAB — COMPREHENSIVE METABOLIC PANEL
ALT: 8 IU/L (ref 0–32)
AST: 8 IU/L (ref 0–40)
Albumin/Globulin Ratio: 1.5 (ref 1.2–2.2)
Albumin: 4.2 g/dL (ref 3.8–4.8)
Alkaline Phosphatase: 111 IU/L (ref 39–117)
BUN/Creatinine Ratio: 21 (ref 12–28)
BUN: 18 mg/dL (ref 8–27)
Bilirubin Total: 0.2 mg/dL (ref 0.0–1.2)
CO2: 24 mmol/L (ref 20–29)
Calcium: 10 mg/dL (ref 8.7–10.3)
Chloride: 101 mmol/L (ref 96–106)
Creatinine, Ser: 0.87 mg/dL (ref 0.57–1.00)
GFR calc Af Amer: 83 mL/min/{1.73_m2} (ref >59)
GFR calc non Af Amer: 72 mL/min/{1.73_m2} (ref >59)
Globulin, Total: 2.8 g/dL (ref 1.5–4.5)
Glucose: 95 mg/dL (ref 65–99)
Potassium: 4.7 mmol/L (ref 3.5–5.2)
Sodium: 140 mmol/L (ref 134–144)
Total Protein: 7 g/dL (ref 6.0–8.5)

## 2020-02-06 LAB — LIPID PANEL W/O CHOL/HDL RATIO
Cholesterol, Total: 203 mg/dL — ABNORMAL HIGH (ref 100–199)
HDL: 44 mg/dL (ref >39)
LDL Chol Calc (NIH): 124 mg/dL — ABNORMAL HIGH (ref 0–99)
Triglycerides: 198 mg/dL — ABNORMAL HIGH (ref 0–149)
VLDL Cholesterol Cal: 35 mg/dL (ref 5–40)

## 2020-02-06 LAB — TSH: TSH: 2.07 u[IU]/mL (ref 0.450–4.500)

## 2020-02-06 MED ORDER — SIMVASTATIN 40 MG PO TABS: 40.0000 mg | ORAL_TABLET | Freq: Every day | ORAL | 0 refills | Status: DC

## 2020-02-06 NOTE — Telephone Encounter (Signed)
I called and discussed labs with the patient.  CBC, CMP, TSH normal.  Lipids slightly increased especially triglycerides and LDL still above goal.  Patient agreeable to increase Simvastatin to 40mg  daily.  Will update medication list.

## 2020-02-06 NOTE — Telephone Encounter (Signed)
Discussed results, see previous encounter

## 2020-02-21 ENCOUNTER — Ambulatory Visit (INDEPENDENT_AMBULATORY_CARE_PROVIDER_SITE_OTHER): Payer: Medicare Other | Admitting: General Practice

## 2020-02-21 ENCOUNTER — Telehealth: Payer: Medicare Other | Admitting: General Practice

## 2020-02-21 DIAGNOSIS — G801 Spastic diplegic cerebral palsy: Secondary | ICD-10-CM

## 2020-02-21 DIAGNOSIS — F419 Anxiety disorder, unspecified: Secondary | ICD-10-CM

## 2020-02-21 DIAGNOSIS — E785 Hyperlipidemia, unspecified: Secondary | ICD-10-CM | POA: Diagnosis not present

## 2020-02-21 DIAGNOSIS — F339 Major depressive disorder, recurrent, unspecified: Secondary | ICD-10-CM

## 2020-02-21 DIAGNOSIS — I10 Essential (primary) hypertension: Secondary | ICD-10-CM

## 2020-02-21 NOTE — Chronic Care Management (AMB) (Signed)
Chronic Care Management   Follow Up Note   02/21/2020 Name: Michelle Chandler MRN: [id number] DOB: 04/21/1956  Referred by: Michelle Maple, MD Reason for referral : Chronic Care Management (Follow up on Chronic Disease Management and Care Coordination Needs)   Michelle Chandler is a 64 y.o. year old female who is a primary care patient of Crissman, Michelle How, MD. The CCM team was consulted for assistance with chronic disease management and care coordination needs.    Review of patient status, including review of consultants reports, relevant laboratory and other test results, and collaboration with appropriate care team members and the patient's provider was performed as part of comprehensive patient evaluation and provision of chronic care management services.    SDOH (Social Determinants of Health) assessments performed: Yes See Care Plan activities for detailed interventions related to SDOH)  SDOH Interventions     Most Recent Value  SDOH Interventions  SDOH Interventions for the Following Domains  Tobacco, Stress, Physical Activity  Physical Activity Interventions  Other (Comments) [limited activity due to chronic conditions and CP]  Stress Interventions  Other (Comment), Provide Counseling [the patient has been more depressed and stressed out lately. Has seen MD, declined LCSW]  Tobacco Interventions  Patient Refused [patient continue to smoke. No desire to quit]       Outpatient Encounter Medications as of 02/21/2020  Medication Sig   aspirin EC 81 MG tablet Take 81 mg by mouth daily.   baclofen (LIORESAL) 10 MG tablet Take 1 tablet (10 mg total) by mouth 2 (two) times daily.   diclofenac Sodium (VOLTAREN) 1 % GEL Voltaren 1 % topical gel   FLUoxetine (PROZAC) 10 MG tablet Take 1 tablet (10 mg total) by mouth daily.   hydrochlorothiazide (HYDRODIURIL) 25 MG tablet Take 1 tablet (25 mg total) by mouth daily.   ibuprofen (ADVIL) 400 MG tablet Take 1 tablet (400 mg total) by mouth 3  (three) times daily as needed.   Incontinence Supply Disposable (ENTRUST PLUS DISP UNDERPADS) MISC by Does not apply route.   Incontinence Supply Disposable (PROTECTIVE UNDERWEAR LARGE) MISC by Does not apply route.   LORazepam (ATIVAN) 1 MG tablet TAKE ONE TABLET DAILY AS NEEDED FOR ANXIETY.   meloxicam (MOBIC) 15 MG tablet Take 1 tablet (15 mg total) by mouth daily.   simvastatin (ZOCOR) 40 MG tablet Take 1 tablet (40 mg total) by mouth daily.   No facility-administered encounter medications on file as of 02/21/2020.     Objective:  BP Readings from Last 3 Encounters:  02/05/20 112/74  10/19/19 (!) 172/78  09/23/18 (!) 151/95    Goals Addressed            This Visit's Progress    RNCM: "I eat regular food" (pt-stated)       Current Barriers:   Chronic Disease Management support, education, and care coordination needs related to HTN, HLD, Depression, and Cerebral Palsy  Clinical Goal(s) related to HTN, HLD, Depression, and Cerebral Palsy :  Over the next 120 days, patient will:   Work with the care management team to address educational, disease management, and care coordination needs   Begin or continue self health monitoring activities as directed today Measure and record blood pressure 4 times per week  Call provider office for new or worsened signs and symptoms Blood pressure findings outside established parameters and New or worsened symptom related to HLD, Depression or Cerebral Palsy  Call care management team with questions or concerns  Verbalize basic understanding  of patient centered plan of care established today  Interventions related to HTN, HLD, Depression, and Cerebral Palsy :   Evaluation of current treatment plans and patient's adherence to plan as established by provider. Saw provider recently and has a new appointment on 03-04-2020 for follow up  Assessed patient understanding of disease states.  The patient denies changes in her condition. Does not  like to discuss health at length.  Assessed patient's education and care coordination needs. Discussed CCM team pharmacist and LCSW. The patient denies the need for assistance at this time. Knows resources are available to help as needed.   Provided disease specific education to patient.  The patient states she is trying to watch her salt intake. The patient is direct with responses to questions. Also discussed if the patient has decreased smoking and the patient has not at this time. Did not wish to discuss smoking cessation further.   Assessed the patients activity level. The patient is not active and limited in her ability to exercise. She has an aide that comes into the home to help her with ADLS/IADLS  Collaborated with appropriate clinical care team members regarding patient needs  Patient Self Care Activities related to HTN, HLD, Depression, and Cerebral Palsy :   Patient is unable to independently self-manage chronic health conditions  Please see past updates related to this goal by clicking on the "Past Updates" button in the selected goal       RNCM: Depression and stress       CARE PLAN ENTRY (see longtitudinal plan of care for additional care plan information)  Current Barriers:   Knowledge Deficits related to depression and stress and Chandler to effectively manage  Lacks caregiver support.   Cognitive Deficits  Nurse Case Manager Clinical Goal(s):   Over the next 120 days, patient will verbalize understanding of plan for effectively managing depression and anxiety  Over the next 120 days, patient will work with pcp, RNCM and LCSW  to address needs related to Chandler to have effective coping skills to deal with depression, stress, and anxiety  Over the next 120 days, patient will demonstrate a decrease in anxiety exacerbations as evidenced by following the plan of care directed by the pcp, taking medications as prescribed and working with resources to help with coping  mechanisms.   Over the next 120 days, patient will attend all scheduled medical appointments: follow up with pcp after medication changes on 03-04-2020  Over the next 120 days, patient will demonstrate improved adherence to prescribed treatment plan for depression, anxiety and stress as evidenced byreturn to baseline status related to chronic depression  Over the next 120 days, patient will work with CM clinical social worker to establish rapport and work with patient on effective coping skills when she is feeling more depressed, stressed, or anxious  Interventions:   Evaluation of current treatment plan related to depression, stress, and anxiety and patient's adherence to plan as established by provider.  Advised patient to discuss her concerns and changes with her pcp. The patient does not like to talk a lot with the [facility] about her depression.  RNCM working with the patient to establish rapport and help the patient feel at ease with discussing her conditions.   Provided education to patient re: keeping appointments, available resources and CCM team here to help with any questions or concerns.   Collaborated with pcp and LCSW regarding patients almost 'flat affect' in her voice and the best way to help the patient  with her chronic conditions of depression and anxiety  Discussed plans with patient for ongoing care management follow up and provided patient with direct contact information for care management team  Reviewed scheduled/upcoming provider appointments including: Appointment with pcp on 03-04-2020  Social Work referral for support with patient with chronic depression and anxiety with effective coping skills.   Patient Self Care Activities:   Attends all scheduled provider appointments  Calls provider office for new concerns or questions  Unable to independently manage depression and anxiety  Unable to perform ADLs independently  Unable to perform IADLs  independently  Initial goal documentation         Plan:   Telephone follow up appointment with care management team member scheduled for:05-01-2020 at 29 am   [place], MSN, [facility] Mobile: [phone]

## 2020-02-21 NOTE — Patient Instructions (Signed)
Visit Information  Goals Addressed            This Visit's Progress   . RNCM: "I eat regular food" (pt-stated)       Current Barriers:  . Chronic Disease Management support, education, and care coordination needs related to HTN, HLD, Depression, and Cerebral Palsy  Clinical Goal(s) related to HTN, HLD, Depression, and Cerebral Palsy :  Over the next 120 days, patient will:  . Work with the care management team to address educational, disease management, and care coordination needs  . Begin or continue self health monitoring activities as directed today Measure and record blood pressure 4 times per week . Call provider office for new or worsened signs and symptoms Blood pressure findings outside established parameters and New or worsened symptom related to HLD, Depression or Cerebral Palsy . Call care management team with questions or concerns . Verbalize basic understanding of patient centered plan of care established today  Interventions related to HTN, HLD, Depression, and Cerebral Palsy :  . Evaluation of current treatment plans and patient's adherence to plan as established by provider. Saw provider recently and has a new appointment on 03-04-2020 for follow up . Assessed patient understanding of disease states.  The patient denies changes in her condition. Does not like to discuss health at length. . Assessed patient's education and care coordination needs. Discussed CCM team pharmacist and LCSW. The patient denies the need for assistance at this time. Knows resources are available to help as needed.  . Provided disease specific education to patient.  The patient states she is trying to watch her salt intake. The patient is direct with responses to questions. Also discussed if the patient has decreased smoking and the patient has not at this time. Did not wish to discuss smoking cessation further.  . Assessed the patients activity level. The patient is not active and limited in her  ability to exercise. She has an aide that comes into the home to help her with ADLS/IADLS . Collaborated with appropriate clinical care team members regarding patient needs  Patient Self Care Activities related to HTN, HLD, Depression, and Cerebral Palsy :  . Patient is unable to independently self-manage chronic health conditions  Please see past updates related to this goal by clicking on the "Past Updates" button in the selected goal      . RNCM: Depression and stress       CARE PLAN ENTRY (see longtitudinal plan of care for additional care plan information)  Current Barriers:  [name] Knowledge Deficits related to depression and stress and how to effectively manage . Lacks caregiver support.  . Cognitive Deficits  Nurse Case Manager Clinical Goal(s):  [name] Over the next 120 days, patient will verbalize understanding of plan for effectively managing depression and anxiety . Over the next 120 days, patient will work with pcp, RNCM and LCSW  to address needs related to how to have effective coping skills to deal with depression, stress, and anxiety . Over the next 120 days, patient will demonstrate a decrease in anxiety exacerbations as evidenced by following the plan of care directed by the pcp, taking medications as prescribed and working with resources to help with coping mechanisms.  . Over the next 120 days, patient will attend all scheduled medical appointments: follow up with pcp after medication changes on 03-04-2020 . Over the next 120 days, patient will demonstrate improved adherence to prescribed treatment plan for depression, anxiety and stress as evidenced byreturn to baseline status  related to chronic depression . Over the next 120 days, patient will work with CM clinical social worker to establish rapport and work with patient on effective coping skills when she is feeling more depressed, stressed, or anxious  Interventions:  . Evaluation of current treatment plan related to  depression, stress, and anxiety and patient's adherence to plan as established by provider. . Advised patient to discuss her concerns and changes with her pcp. The patient does not like to talk a lot with the [facility] about her depression.  RNCM working with the patient to establish rapport and help the patient feel at ease with discussing her conditions.  . Provided education to patient re: keeping appointments, available resources and CCM team here to help with any questions or concerns.  [name] Collaborated with pcp and LCSW regarding patients almost 'flat affect' in her voice and the best way to help the patient with her chronic conditions of depression and anxiety . Discussed plans with patient for ongoing care management follow up and provided patient with direct contact information for care management team . Reviewed scheduled/upcoming provider appointments including: Appointment with pcp on 03-04-2020 . Social Work referral for support with patient with chronic depression and anxiety with effective coping skills.   Patient Self Care Activities:  . Attends all scheduled provider appointments . Calls provider office for new concerns or questions . Unable to independently manage depression and anxiety . Unable to perform ADLs independently . Unable to perform IADLs independently  Initial goal documentation        Patient verbalizes understanding of instructions provided today.   Telephone follow up appointment with care management team member scheduled for: 05-01-2020 11 am  [place], MSN, [facility] Mobile: [phone]

## 2020-03-04 ENCOUNTER — Ambulatory Visit: Payer: Medicare Other | Admitting: Nurse Practitioner

## 2020-03-05 ENCOUNTER — Ambulatory Visit: Payer: Medicare Other | Admitting: Nurse Practitioner

## 2020-03-25 ENCOUNTER — Other Ambulatory Visit: Payer: Self-pay | Admitting: Nurse Practitioner

## 2020-03-25 DIAGNOSIS — E7800 Pure hypercholesterolemia, unspecified: Secondary | ICD-10-CM

## 2020-03-25 DIAGNOSIS — F339 Major depressive disorder, recurrent, unspecified: Secondary | ICD-10-CM

## 2020-03-25 DIAGNOSIS — I10 Essential (primary) hypertension: Secondary | ICD-10-CM

## 2020-03-25 MED ORDER — SIMVASTATIN 40 MG PO TABS: 40.0000 mg | ORAL_TABLET | Freq: Every day | ORAL | 0 refills | Status: DC

## 2020-03-25 MED ORDER — FLUOXETINE HCL 10 MG PO TABS: 10.0000 mg | ORAL_TABLET | Freq: Every day | ORAL | 0 refills | Status: DC

## 2020-03-25 MED ORDER — HYDROCHLOROTHIAZIDE 25 MG PO TABS: 25.0000 mg | ORAL_TABLET | Freq: Every day | ORAL | 0 refills | Status: DC

## 2020-03-25 NOTE — Telephone Encounter (Signed)
Copied from [place] [id number]. Topic: Quick Communication - Rx Refill/Question >> Mar 25, 2020  3:07 PM Rainey Pines A wrote: Medication: hydrochlorothiazide (HYDRODIURIL) 25 MG tablet ,simvastatin (ZOCOR) 40 MG tablet ,FLUoxetine (PROZAC) 10 MG tablet, (Patient requesting 90 day supply)  Has the patient contacted their pharmacy? yes (Agent: If no, request that the patient contact the pharmacy for the refill.) (Agent: If yes, when and what did the pharmacy advise?) contact pcp  Preferred Pharmacy (with phone number or street name): [facility], [place]  Phone:  [phone] Fax:  [phone]     Agent: Please be advised that RX refills may take up to 3 business days. We ask that you follow-up with your pharmacy.

## 2020-03-25 NOTE — Telephone Encounter (Signed)
Requested Prescriptions  Pending Prescriptions Disp Refills  . hydrochlorothiazide (HYDRODIURIL) 25 MG tablet 30 tablet 0    Sig: Take 1 tablet (25 mg total) by mouth daily.     Cardiovascular: Diuretics - Thiazide Passed - 03/25/2020  3:18 PM      Passed - Ca in normal range and within 360 days    Calcium  Date Value Ref Range Status  02/05/2020 10.0 8.7 - 10.3 mg/dL Final         Passed - Cr in normal range and within 360 days    Creatinine, Ser  Date Value Ref Range Status  02/05/2020 0.87 0.57 - 1.00 mg/dL Final         Passed - K in normal range and within 360 days    Potassium  Date Value Ref Range Status  02/05/2020 4.7 3.5 - 5.2 mmol/L Final         Passed - Na in normal range and within 360 days    Sodium  Date Value Ref Range Status  02/05/2020 140 134 - 144 mmol/L Final         Passed - Last BP in normal range    BP Readings from Last 1 Encounters:  02/05/20 112/74         Passed - Valid encounter within last 6 months    Recent Outpatient Visits          1 month ago Depression, recurrent ([place])   [place], Jessica I, NP   5 months ago Essential hypertension   [facility] [place], Megan P, DO   9 months ago Pure hypercholesterolemia   [facility] Crissman, Jeannette How, MD   1 year ago Spastic diplegic cerebral palsy [facility])   [facility] Volney American, [place]   1 year ago Essential hypertension   [place], Mark A, MD             . simvastatin (ZOCOR) 40 MG tablet 90 tablet 0    Sig: Take 1 tablet (40 mg total) by mouth daily.     Cardiovascular:  Antilipid - Statins Failed - 03/25/2020  3:18 PM      Failed - Total Cholesterol in normal range and within 360 days    Cholesterol, Total  Date Value Ref Range Status  02/05/2020 203 (H) 100 - 199 mg/dL Final   Cholesterol Piccolo, Waived  Date Value Ref Range Status  08/16/2018 231 (H) <200 mg/dL Final    Comment:                             Desirable                <200                         Borderline High      200- 239                         High                     >239          Failed - LDL in normal range and within 360 days    LDL Chol Calc (NIH)  Date Value Ref Range Status  02/05/2020 124 (H) 0 - 99 mg/dL Final  Failed - Triglycerides in normal range and within 360 days    Triglycerides  Date Value Ref Range Status  02/05/2020 198 (H) 0 - 149 mg/dL Final   Triglycerides Piccolo,Waived  Date Value Ref Range Status  08/16/2018 266 (H) <150 mg/dL Final    Comment:                            Normal                   <150                         Borderline High     150 - 199                         High                200 - 499                         Very High                >499          Passed - HDL in normal range and within 360 days    HDL  Date Value Ref Range Status  02/05/2020 44 >39 mg/dL Final         Passed - Patient is not pregnant      Passed - Valid encounter within last 12 months    Recent Outpatient Visits          1 month ago Depression, recurrent ([place])   [place], Jessica I, NP   5 months ago Essential hypertension   [place], Megan P, DO   9 months ago Pure hypercholesterolemia   [place] Crissman, Jeannette How, MD   1 year ago Spastic diplegic cerebral palsy [facility])   [facility] Volney American, PA-C   1 year ago Essential hypertension   [place], Mark A, MD             . FLUoxetine (PROZAC) 10 MG tablet 30 tablet 0    Sig: Take 1 tablet (10 mg total) by mouth daily.     Psychiatry:  Antidepressants - SSRI Passed - 03/25/2020  3:18 PM      Passed - Completed PHQ-2 or PHQ-9 in the last 360 days.      Passed - Valid encounter within last 6 months    Recent Outpatient Visits          1 month ago Depression, recurrent ([place])   [place], NP   5 months ago Essential hypertension   [place], Megan P, DO   9 months ago Pure hypercholesterolemia   [place], Jeannette How, MD   1 year ago Spastic diplegic cerebral palsy [facility])   [facility] Volney American, [place]   1 year ago Essential hypertension   [facility] Crissman, Jeannette How, MD

## 2020-04-05 DIAGNOSIS — Z1231 Encounter for screening mammogram for malignant neoplasm of breast: Secondary | ICD-10-CM | POA: Diagnosis not present

## 2020-04-05 LAB — HM MAMMOGRAPHY

## 2020-04-17 ENCOUNTER — Ambulatory Visit (INDEPENDENT_AMBULATORY_CARE_PROVIDER_SITE_OTHER): Payer: Medicare Other | Admitting: Licensed Clinical Social Worker

## 2020-04-17 ENCOUNTER — Telehealth: Payer: Self-pay | Admitting: Family Medicine

## 2020-04-17 DIAGNOSIS — F339 Major depressive disorder, recurrent, unspecified: Secondary | ICD-10-CM

## 2020-04-17 DIAGNOSIS — E785 Hyperlipidemia, unspecified: Secondary | ICD-10-CM

## 2020-04-17 DIAGNOSIS — F419 Anxiety disorder, unspecified: Secondary | ICD-10-CM

## 2020-04-17 DIAGNOSIS — E7800 Pure hypercholesterolemia, unspecified: Secondary | ICD-10-CM

## 2020-04-17 DIAGNOSIS — I10 Essential (primary) hypertension: Secondary | ICD-10-CM

## 2020-04-17 NOTE — Progress Notes (Signed)
Pt has been scheduled for 04/26/2020

## 2020-04-17 NOTE — Chronic Care Management (AMB) (Signed)
Chronic Care Management    Clinical Social Work Follow Up Note  04/17/2020 Name: Michelle Chandler MRN: [id number] DOB: 04/23/1956  Michelle Chandler is a 64 y.o. year old female who is a primary care patient of Crissman, Jeannette How, MD. The CCM team was consulted for assistance with Mental Health Counseling and Resources.   Review of patient status, including review of consultants reports, other relevant assessments, and collaboration with appropriate care team members and the patient's provider was performed as part of comprehensive patient evaluation and provision of chronic care management services.    SDOH (Social Determinants of Health) assessments performed: Yes   SDOH Interventions     Most Recent Value  SDOH Interventions  Depression Interventions/Treatment   Medication       Outpatient Encounter Medications as of 04/17/2020  Medication Sig  . aspirin EC 81 MG tablet Take 81 mg by mouth daily.  . baclofen (LIORESAL) 10 MG tablet Take 1 tablet (10 mg total) by mouth 2 (two) times daily.  . diclofenac Sodium (VOLTAREN) 1 % GEL Voltaren 1 % topical gel  . FLUoxetine (PROZAC) 10 MG tablet Take 1 tablet (10 mg total) by mouth daily.  . hydrochlorothiazide (HYDRODIURIL) 25 MG tablet Take 1 tablet (25 mg total) by mouth daily.  [name] ibuprofen (ADVIL) 400 MG tablet Take 1 tablet (400 mg total) by mouth 3 (three) times daily as needed.  . Incontinence Supply Disposable (ENTRUST PLUS DISP UNDERPADS) MISC by Does not apply route.  . Incontinence Supply Disposable (PROTECTIVE UNDERWEAR LARGE) MISC by Does not apply route.  [name] LORazepam (ATIVAN) 1 MG tablet TAKE ONE TABLET DAILY AS NEEDED FOR ANXIETY.  . meloxicam (MOBIC) 15 MG tablet Take 1 tablet (15 mg total) by mouth daily.  . simvastatin (ZOCOR) 40 MG tablet Take 1 tablet (40 mg total) by mouth daily.   No facility-administered encounter medications on file as of 04/17/2020.     Goals Addressed    . SW: "My depression is getting better." (pt-stated)         Current Barriers:  . Chronic Mental Health needs related to depression and stress . Limited social support . Mental Health Concerns  . Social Isolation . Suicidal Ideation/Homicidal Ideation: No  Clinical Social Work Goal(s):  [name] Over the next 120 days, patient will work with SW  bi-monthly  by telephone or in person to reduce or manage symptoms related to depression . Over the next 120 days, patient will demonstrate improved health management independence as evidenced by implementing healthy self-care and positive support/resources into her daily routine to cope with stressors and improve overall health and well-being  . Over the next 120 days, patient will work with SW to address concerns related to gaining additional support/resource connection in order to maintain health . Over the next 120 days, patient will demonstrate improved adherence to self care as evidenced by implementing healthy self-care into her daily routine such as: attending all medical appointments, deep breathing exercsies, taking time for self-reflection, taking medications as prescribed, drinking water and daily exercise to improve mobility.   Interventions: . Patient interviewed and appropriate assessments performed: PHQ 2 and PHQ 9 . Patient interviewed and appropriate assessments performed . Provided patient with information about available mental health support resources and socialization opportunities for seniors within her nearby area.  . Discussed plans with patient for ongoing care management follow up and provided patient with direct contact information for care management team . LCSW was able to successfully build rapport with patient. [name]  LCSW discussed coping skills for anxiety. SW used empathetic and active and reflective listening, validated patient's feelings/concerns, and provided emotional support. LCSW provided self-care education to help manage her multiple health conditions and improve her mood.   [name] with [facility] Pharmacist re: patient is having issues with keeping up with refilling her medications. She is wondering if Pharmacy can assist her with adjusting her medications to 90 day refills instead of 30 day. She reports that some of her medications have no refills. She reports ongoing stress with medication management and is wondering if there is an easier way to manage all of her medications.  . Assisted patient/caregiver with obtaining information about health plan benefits . Referred patient to [facility] and [facility] for long term follow up and therapy/counseling. Patient reports that her recent increase in her antidepressant is effectively working for her. Patient denied needing further mental health support at this time.  . Solution-Focused Strategies implemented during session today.  Patient Self Care Activities:  . Calls provider office for new concerns or questions . Ability for insight . Independent living . Motivation for treatment  Patient Coping Strengths:  . Supportive Relationships . Hopefulness . Able to Communicate Effectively  Patient Self Care Deficits:  . Unable to independently keep up with medication refills . Lacks social connections  Initial goal documentation      Follow Up Plan: SW will follow up with patient by phone over the next quarter  Eula Fried, [place], MSW, [email or web address] Phone: [phone]

## 2020-04-17 NOTE — Chronic Care Management (AMB) (Signed)
  Chronic Care Management   Note  04/17/2020 Name: Sophi Obrien MRN: [id number] DOB: 1956-02-08  Lashonte Wight is a 64 y.o. year old female who is a primary care patient of Crissman, Jeannette How, MD. Berneita Lundwall is currently enrolled in care management services. An additional referral for Pharm D  was placed.   Follow up plan: Telephone appointment with care management team member scheduled for:04/26/2020  Noreene Larsson, [place], [place], [place] [address] Direct Dial: [phone] [email or web address] Website: [email or web address]

## 2020-04-26 ENCOUNTER — Ambulatory Visit: Payer: Self-pay | Admitting: Pharmacist

## 2020-04-26 ENCOUNTER — Telehealth: Payer: Medicare Other

## 2020-04-26 NOTE — Chronic Care Management (AMB) (Signed)
  Chronic Care Management   Note  04/26/2020 Name: Tyria Springer MRN: [id number] DOB: February 22, 1956  Taja Pentland is a 64 y.o. year old female who is a primary care patient of Crissman, Jeannette How, MD. The CCM team was consulted for assistance with chronic disease management and care coordination needs.    Attempted to contact patient for medication management review. Left HIPAA compliant message for patient to return my call at their convenience.   Plan: - Will collaborate with Care Guide to outreach to schedule follow up with me  [name] Darnelle Maffucci, PharmD, [place] [facility] [phone]

## 2020-04-29 ENCOUNTER — Telehealth: Payer: Self-pay | Admitting: Pharmacist

## 2020-04-29 ENCOUNTER — Other Ambulatory Visit: Payer: Self-pay | Admitting: Nurse Practitioner

## 2020-04-29 ENCOUNTER — Telehealth: Payer: Self-pay | Admitting: Family Medicine

## 2020-04-29 DIAGNOSIS — I10 Essential (primary) hypertension: Secondary | ICD-10-CM

## 2020-04-29 MED ORDER — HYDROCHLOROTHIAZIDE 25 MG PO TABS: 25.0000 mg | ORAL_TABLET | Freq: Every day | ORAL | 0 refills | Status: DC

## 2020-04-29 NOTE — Telephone Encounter (Signed)
Will collaborate w/ scheduling Care Guide to schedule patient   Copied from [place] [phone]. Topic: General - Inquiry >> Apr 26, 2020  3:25 PM Alease Frame wrote: Reason for CRM: Pt returned call from office pharmacy [facility])  .Please return pts phone call .

## 2020-04-29 NOTE — Chronic Care Management (AMB) (Signed)
  Chronic Care Management   Note  04/29/2020 Name: Michelle Chandler MRN: [id number] DOB: Apr 05, 1956  Michelle Chandler is a 64 y.o. year old female who is a primary care patient of Crissman, Jeannette How, MD and is actively engaged with the care management team. I reached out to Michelle Chandler by phone today to assist with re-scheduling an initial visit with the Pharmacist.  Follow up plan: Telephone appointment with care management team member scheduled for: 05/24/2020   [place], [place], [place] [address] Direct Dial: [email or web address] Website: [email or web address]

## 2020-04-29 NOTE — Telephone Encounter (Signed)
Advised pt rx has been sent to pharmacy also scheduled f/u 07/01/20

## 2020-04-29 NOTE — Telephone Encounter (Signed)
-----   Message from Osvaldo Shipper, [place] sent at 04/29/2020 10:30 AM EDT ----- Regarding: Needs refill Called patient and rescheduled initial CCM outreach with Pharmacist for 05/24/2020.   Patient states that she is out of her hydrochlorothiazide and would like 90 day refill on medication for blood pressure and that was the reason she called back.    hydrochlorothiazide (HYDRODIURIL) 25 MG tablet needs refill out since Friday 6/11  [name] If I need to send this information to the PCP for refill I will. Just let me know. Pt did not want to call back to the office for refill because she said that was why she called the first time.  Thank you   [name]

## 2020-05-01 ENCOUNTER — Ambulatory Visit: Payer: Self-pay | Admitting: General Practice

## 2020-05-01 ENCOUNTER — Telehealth: Payer: Medicare Other | Admitting: General Practice

## 2020-05-01 DIAGNOSIS — I10 Essential (primary) hypertension: Secondary | ICD-10-CM

## 2020-05-01 DIAGNOSIS — G801 Spastic diplegic cerebral palsy: Secondary | ICD-10-CM

## 2020-05-01 DIAGNOSIS — E785 Hyperlipidemia, unspecified: Secondary | ICD-10-CM

## 2020-05-01 DIAGNOSIS — E7800 Pure hypercholesterolemia, unspecified: Secondary | ICD-10-CM | POA: Diagnosis not present

## 2020-05-01 DIAGNOSIS — F339 Major depressive disorder, recurrent, unspecified: Secondary | ICD-10-CM

## 2020-05-01 NOTE — Chronic Care Management (AMB) (Signed)
Chronic Care Management   Follow Up Note   05/01/2020 Name: Michelle Chandler MRN: [id number] DOB: 06/12/1956  Referred by: Michelle Maple, MD Reason for referral : Chronic Care Management (Follow up on Chronic Conditions: HTN/HLD/Depression/CP)   Michelle Chandler is a 64 y.o. year old female who is a primary care patient of Crissman, Jeannette How, MD. The CCM team was consulted for assistance with chronic disease management and care coordination needs.    Review of patient status, including review of consultants reports, relevant laboratory and other test results, and collaboration with appropriate care team members and the patient's provider was performed as part of comprehensive patient evaluation and provision of chronic care management services.    SDOH (Social Determinants of Health) assessments performed: Yes See Care Plan activities for detailed interventions related to [name])     Outpatient Encounter Medications as of 05/01/2020  Medication Sig  . aspirin EC 81 MG tablet Take 81 mg by mouth daily.  . baclofen (LIORESAL) 10 MG tablet Take 1 tablet (10 mg total) by mouth 2 (two) times daily.  . diclofenac Sodium (VOLTAREN) 1 % GEL Voltaren 1 % topical gel  . FLUoxetine (PROZAC) 10 MG tablet Take 1 tablet (10 mg total) by mouth daily.  . hydrochlorothiazide (HYDRODIURIL) 25 MG tablet Take 1 tablet (25 mg total) by mouth daily.  [name] ibuprofen (ADVIL) 400 MG tablet Take 1 tablet (400 mg total) by mouth 3 (three) times daily as needed.  . Incontinence Supply Disposable (ENTRUST PLUS DISP UNDERPADS) MISC by Does not apply route.  . Incontinence Supply Disposable (PROTECTIVE UNDERWEAR LARGE) MISC by Does not apply route.  [name] LORazepam (ATIVAN) 1 MG tablet TAKE ONE TABLET DAILY AS NEEDED FOR ANXIETY.  . meloxicam (MOBIC) 15 MG tablet Take 1 tablet (15 mg total) by mouth daily.  . simvastatin (ZOCOR) 40 MG tablet Take 1 tablet (40 mg total) by mouth daily.   No facility-administered encounter medications  on file as of 05/01/2020.     Objective:   Goals Addressed              This Visit's Progress   .  RNCM: "I eat regular food" (pt-stated)        Current Barriers:  . Chronic Disease Management support, education, and care coordination needs related to HTN, HLD, Depression, and Cerebral Palsy  Clinical Goal(s) related to HTN, HLD, Depression, and Cerebral Palsy :  Over the next 120 days, patient will:  . Work with the care management team to address educational, disease management, and care coordination needs  . Begin or continue self health monitoring activities as directed today Measure and record blood pressure 4 times per week . Call provider office for new or worsened signs and symptoms Blood pressure findings outside established parameters and New or worsened symptom related to HLD, Depression or Cerebral Palsy . Call care management team with questions or concerns . Verbalize basic understanding of patient centered plan of care established today  Interventions related to HTN, HLD, Depression, and Cerebral Palsy :  . Evaluation of current treatment plans and patient's adherence to plan as established by provider. Saw provider recently and has a new appointment on 07-01-2020 for follow up . Assessed patient understanding of disease states.  The patient denies changes in her condition. Does not like to discuss health at length. Becomes more open with each new outreach. The patient is taking her blood pressure at home. Last reading was 133/67.  [name] Assessed patient's education and care  coordination needs. Discussed CCM team pharmacist and LCSW. The patient is receptive to CCM team involvement. The patient is working with the LCSW and was given the pharmacist number today to help with medication assistance.  . Provided disease specific education to patient.  The patient states she is trying to watch her salt intake. The patient is direct with responses to questions. Also discussed if the  patient has decreased smoking and the patient has not at this time. Did not wish to discuss smoking cessation further.  . Assessed the patients activity level. The patient is not active and limited in her ability to exercise. She has an aide that comes into the home to help her with ADLS/IADLS . Collaborated with appropriate clinical care team members regarding patient needs  Patient Self Care Activities related to HTN, HLD, Depression, and Cerebral Palsy :  . Patient is unable to independently self-manage chronic health conditions  Please see past updates related to this goal by clicking on the "Past Updates" button in the selected goal      .  RNCM: Depression and stress        CARE PLAN ENTRY (see longtitudinal plan of care for additional care plan information)  Current Barriers:  [name] Knowledge Deficits related to depression and stress and how to effectively manage . Lacks caregiver support.  . Cognitive Deficits  Nurse Case Manager Clinical Goal(s):  [name] Over the next 120 days, patient will verbalize understanding of plan for effectively managing depression and anxiety . Over the next 120 days, patient will work with pcp, RNCM and LCSW  to address needs related to how to have effective coping skills to deal with depression, stress, and anxiety . Over the next 120 days, patient will demonstrate a decrease in anxiety exacerbations as evidenced by following the plan of care directed by the pcp, taking medications as prescribed and working with resources to help with coping mechanisms.  . Over the next 120 days, patient will attend all scheduled medical appointments: follow up with pcp after medication changes on 03-04-2020 . Over the next 120 days, patient will demonstrate improved adherence to prescribed treatment plan for depression, anxiety and stress as evidenced byreturn to baseline status related to chronic depression . Over the next 120 days, patient will work with CM clinical social  worker to establish rapport and work with patient on effective coping skills when she is feeling more depressed, stressed, or anxious  Interventions:  . Evaluation of current treatment plan related to depression, stress, and anxiety and patient's adherence to plan as established by provider. . Advised patient to discuss her concerns and changes with her pcp. The patient does not like to talk a lot with the [facility] about her depression.  RNCM working with the patient to establish rapport and help the patient feel at ease with discussing her conditions. 05-01-2020: the patient states she is managing her depression well at this time. Is interacting with the LCSW also.  [name] Provided education to patient re: keeping appointments, available resources and CCM team here to help with any questions or concerns.  [name] Collaborated with pcp and LCSW regarding patients almost 'flat affect' in her voice and the best way to help the patient with her chronic conditions of depression and anxiety.  05-01-2020: the CCM team is actively working with the patient and establishing rapport with the patient. The patient seems to be more engaged with each new outreach.  . Discussed plans with patient for ongoing care management follow  up and provided patient with direct contact information for care management team . Reviewed scheduled/upcoming provider appointments including: Appointment with pcp on 8-16--2021 . Social Work referral for support with patient with chronic depression and anxiety with effective coping skills. Actively working with the [employer]  Patient Self Care Activities:  . Attends all scheduled provider appointments . Calls provider office for new concerns or questions . Unable to independently manage depression and anxiety . Unable to perform ADLs independently . Unable to perform IADLs independently  Please see past updates related to this goal by clicking on the "Past Updates" button in the selected goal           Plan:   Telephone follow up appointment with care management team member scheduled for:07-02-2020 at 38 am   [place], MSN, [facility] Mobile: [phone]

## 2020-05-01 NOTE — Patient Instructions (Signed)
Visit Information  Goals Addressed              This Visit's Progress     RNCM: "I eat regular food" (pt-stated)        Current Barriers:   Chronic Disease Management support, education, and care coordination needs related to HTN, HLD, Depression, and Cerebral Palsy  Clinical Goal(s) related to HTN, HLD, Depression, and Cerebral Palsy :  Over the next 120 days, patient will:   Work with the care management team to address educational, disease management, and care coordination needs   Begin or continue self health monitoring activities as directed today Measure and record blood pressure 4 times per week  Call provider office for new or worsened signs and symptoms Blood pressure findings outside established parameters and New or worsened symptom related to HLD, Depression or Cerebral Palsy  Call care management team with questions or concerns  Verbalize basic understanding of patient centered plan of care established today  Interventions related to HTN, HLD, Depression, and Cerebral Palsy :   Evaluation of current treatment plans and patient's adherence to plan as established by provider. Saw provider recently and has a new appointment on 07-01-2020 for follow up  Assessed patient understanding of disease states.  The patient denies changes in her condition. Does not like to discuss health at length. Becomes more open with each new outreach. The patient is taking her blood pressure at home. Last reading was 133/67.   Assessed patient's education and care coordination needs. Discussed CCM team pharmacist and LCSW. The patient is receptive to CCM team involvement. The patient is working with the LCSW and was given the pharmacist number today to help with medication assistance.   Provided disease specific education to patient.  The patient states she is trying to watch her salt intake. The patient is direct with responses to questions. Also discussed if the patient has decreased smoking  and the patient has not at this time. Did not wish to discuss smoking cessation further.   Assessed the patients activity level. The patient is not active and limited in her ability to exercise. She has an aide that comes into the home to help her with ADLS/IADLS  Collaborated with appropriate clinical care team members regarding patient needs  Patient Self Care Activities related to HTN, HLD, Depression, and Cerebral Palsy :   Patient is unable to independently self-manage chronic health conditions  Please see past updates related to this goal by clicking on the "Past Updates" button in the selected goal        RNCM: Depression and stress        CARE PLAN ENTRY (see longtitudinal plan of care for additional care plan information)  Current Barriers:   Knowledge Deficits related to depression and stress and how to effectively manage  Lacks caregiver support.   Cognitive Deficits  Nurse Case Manager Clinical Goal(s):   Over the next 120 days, patient will verbalize understanding of plan for effectively managing depression and anxiety  Over the next 120 days, patient will work with pcp, RNCM and LCSW  to address needs related to how to have effective coping skills to deal with depression, stress, and anxiety  Over the next 120 days, patient will demonstrate a decrease in anxiety exacerbations as evidenced by following the plan of care directed by the pcp, taking medications as prescribed and working with resources to help with coping mechanisms.   Over the next 120 days, patient will attend all scheduled medical  appointments: follow up with pcp after medication changes on 03-04-2020  Over the next 120 days, patient will demonstrate improved adherence to prescribed treatment plan for depression, anxiety and stress as evidenced byreturn to baseline status related to chronic depression  Over the next 120 days, patient will work with CM clinical social worker to establish rapport and  work with patient on effective coping skills when she is feeling more depressed, stressed, or anxious  Interventions:   Evaluation of current treatment plan related to depression, stress, and anxiety and patient's adherence to plan as established by provider.  Advised patient to discuss her concerns and changes with her pcp. The patient does not like to talk a lot with the [facility] about her depression.  RNCM working with the patient to establish rapport and help the patient feel at ease with discussing her conditions. 05-01-2020: the patient states she is managing her depression well at this time. Is interacting with the LCSW also.   Provided education to patient re: keeping appointments, available resources and CCM team here to help with any questions or concerns.   Collaborated with pcp and LCSW regarding patients almost 'flat affect' in her voice and the best way to help the patient with her chronic conditions of depression and anxiety.  05-01-2020: the CCM team is actively working with the patient and establishing rapport with the patient. The patient seems to be more engaged with each new outreach.   Discussed plans with patient for ongoing care management follow up and provided patient with direct contact information for care management team  Reviewed scheduled/upcoming provider appointments including: Appointment with pcp on 8-16--2021  Social Work referral for support with patient with chronic depression and anxiety with effective coping skills. Actively working with the [employer]  Patient Self Care Activities:   Attends all scheduled provider appointments  Calls provider office for new concerns or questions  Unable to independently manage depression and anxiety  Unable to perform ADLs independently  Unable to perform IADLs independently  Please see past updates related to this goal by clicking on the "Past Updates" button in the selected goal         Patient verbalizes  understanding of instructions provided today.   Telephone follow up appointment with care management team member scheduled for: 07-02-2020 at 89 am   Noreene Larsson RN, MSN, [facility] Mobile: [phone]

## 2020-05-03 DIAGNOSIS — Z23 Encounter for immunization: Secondary | ICD-10-CM | POA: Diagnosis not present

## 2020-05-10 ENCOUNTER — Ambulatory Visit: Payer: Medicare Other | Admitting: Pharmacist

## 2020-05-10 DIAGNOSIS — G801 Spastic diplegic cerebral palsy: Secondary | ICD-10-CM

## 2020-05-10 DIAGNOSIS — F339 Major depressive disorder, recurrent, unspecified: Secondary | ICD-10-CM

## 2020-05-10 DIAGNOSIS — E7800 Pure hypercholesterolemia, unspecified: Secondary | ICD-10-CM | POA: Diagnosis not present

## 2020-05-10 DIAGNOSIS — I10 Essential (primary) hypertension: Secondary | ICD-10-CM

## 2020-05-10 DIAGNOSIS — E785 Hyperlipidemia, unspecified: Secondary | ICD-10-CM | POA: Diagnosis not present

## 2020-05-10 NOTE — Patient Instructions (Signed)
Visit Information  Goals Addressed              This Visit's Progress     Patient Stated   .  PharmD "I want to keep my medications organized" (pt-stated)        CARE PLAN ENTRY (see longitudinal plan of care for additional care plan information)  Current Barriers:  . Polypharmacy; complex patient with multiple comorbidities including HTN, cerebral palsy, depression, HLD . Notes today that she would like 90 day supplies on all medications. Discussed that some medications are not appropriate for 90 day supplies . Does not use a pill box. Notes she does not need.  . Most recent eGFR: 72 mL/min o Depression: fluoxetine 10 mg daily (though last note says to increase to 30 mg daily), lorazepam 1 mg daily for anxiety, sleep o HTN: HCTZ 25 mg daily o Pain/muscle spasm: meloxicam 15 mg daily, ibuprofen 400 mg daily - reports that she uses these sparingly, yet requests refill for 90 day supplies on both. o ASCVD risk reduction: ASA 81 mg daily o HLD: simvastatin 40 mg daily (reports that she is taking 2 20 mg tablets daily)  Pharmacist Clinical Goal(s):  . Over the next 90 days, patient will work with PharmD and provider towards optimized medication management  Interventions: . Comprehensive medication review performed; medication list updated in electronic medical record . Inter-disciplinary care team collaboration (see longitudinal plan of care) . Discussed that lorazepam is inappropriate to fill in 90 day supplies. Discussed that chronic meds (HCTZ, fluoxetine, simvastatin) are appropriate as 90 day. Will collaborate w/ PCP to send fluxoetine 30 mg daily for 90 day supply . Reviewed that patient has an upcoming appointment in August. She should discuss 90 day supplies w/ refills at that appointment  Patient Self Care Activities:  . Patient will take medications as prescribed  Initial goal documentation        The patient verbalized understanding of instructions provided today  and declined a print copy of patient instruction materials.    Plan:  - Rescheduled f/u in ~8 weeks  [name] Travis, PharmD, BCACP Clinical Pharmacist [facility] [phone]   

## 2020-05-10 NOTE — Chronic Care Management (AMB) (Signed)
Chronic Care Management   Follow Up Note   05/10/2020 Name: Michelle Chandler MRN: [id number] DOB: 08/15/1956  Referred by: Guadalupe Maple, MD Reason for referral : Chronic Care Management (Medication Management)   Michelle Chandler is a 64 y.o. year old female who is a primary care patient of Crissman, Jeannette How, MD. The CCM team was consulted for assistance with chronic disease management and care coordination needs.    Received call from patient today regarding medication management.   Review of patient status, including review of consultants reports, relevant laboratory and other test results, and collaboration with appropriate care team members and the patient's provider was performed as part of comprehensive patient evaluation and provision of chronic care management services.    SDOH (Social Determinants of Health) assessments performed: No See Care Plan activities for detailed interventions related to [facility])     Outpatient Encounter Medications as of 05/10/2020  Medication Sig Note   aspirin EC 81 MG tablet Take 81 mg by mouth daily.    baclofen (LIORESAL) 10 MG tablet Take 1 tablet (10 mg total) by mouth 2 (two) times daily.    FLUoxetine (PROZAC) 10 MG tablet Take 1 tablet (10 mg total) by mouth daily.    hydrochlorothiazide (HYDRODIURIL) 25 MG tablet Take 1 tablet (25 mg total) by mouth daily.    ibuprofen (ADVIL) 400 MG tablet Take 1 tablet (400 mg total) by mouth 3 (three) times daily as needed.    Incontinence Supply Disposable (ENTRUST PLUS DISP UNDERPADS) MISC by Does not apply route.    Incontinence Supply Disposable (PROTECTIVE UNDERWEAR LARGE) MISC by Does not apply route.    LORazepam (ATIVAN) 1 MG tablet TAKE ONE TABLET DAILY AS NEEDED FOR ANXIETY.    meloxicam (MOBIC) 15 MG tablet Take 1 tablet (15 mg total) by mouth daily. 05/10/2020: PRN   simvastatin (ZOCOR) 40 MG tablet Take 1 tablet (40 mg total) by mouth daily.    [DISCONTINUED] diclofenac Sodium (VOLTAREN) 1 %  GEL Voltaren 1 % topical gel (Patient not taking: Reported on 05/10/2020)    No facility-administered encounter medications on file as of 05/10/2020.     Objective:   Goals Addressed              This Visit's Progress     Patient Stated     PharmD "I want to keep my medications organized" (pt-stated)        CARE PLAN ENTRY (see longitudinal plan of care for additional care plan information)  Current Barriers:   Polypharmacy; complex patient with multiple comorbidities including HTN, cerebral palsy, depression, HLD  Notes today that she would like 90 day supplies on all medications. Discussed that some medications are not appropriate for 90 day supplies  Does not use a pill box. Notes she does not need.   Most recent eGFR: 72 mL/min o Depression: fluoxetine 10 mg daily (though last note says to increase to 30 mg daily), lorazepam 1 mg daily for anxiety, sleep o HTN: HCTZ 25 mg daily o Pain/muscle spasm: meloxicam 15 mg daily, ibuprofen 400 mg daily - reports that she uses these sparingly, yet requests refill for 90 day supplies on both. o ASCVD risk reduction: ASA 81 mg daily o HLD: simvastatin 40 mg daily (reports that she is taking 2 20 mg tablets daily)  Pharmacist Clinical Goal(s):   Over the next 90 days, patient will work with PharmD and provider towards optimized medication management  Interventions:  Comprehensive medication review performed; medication list  updated in electronic medical record  Inter-disciplinary care team collaboration (see longitudinal plan of care)  Discussed that lorazepam is inappropriate to fill in 90 day supplies. Discussed that chronic meds (HCTZ, fluoxetine, simvastatin) are appropriate as 90 day. Will collaborate w/ PCP to send fluxoetine 30 mg daily for 90 day supply  Reviewed that patient has an upcoming appointment in August. She should discuss 90 day supplies w/ refills at that appointment  Patient Self Care Activities:    Patient will take medications as prescribed  Initial goal documentation         Plan:  - Rescheduled f/u in ~8 weeks  [name] Darnelle Maffucci, PharmD, [place] [phone]

## 2020-05-13 ENCOUNTER — Other Ambulatory Visit: Payer: Self-pay | Admitting: Nurse Practitioner

## 2020-05-13 DIAGNOSIS — F339 Major depressive disorder, recurrent, unspecified: Secondary | ICD-10-CM

## 2020-05-13 MED ORDER — FLUOXETINE HCL 20 MG PO CAPS: 20.0000 mg | ORAL_CAPSULE | Freq: Every day | ORAL | 0 refills | Status: DC

## 2020-05-13 MED ORDER — FLUOXETINE HCL 10 MG PO TABS: 10.0000 mg | ORAL_TABLET | Freq: Every day | ORAL | 0 refills | Status: DC

## 2020-05-24 ENCOUNTER — Telehealth: Payer: Medicare Other

## 2020-05-24 ENCOUNTER — Other Ambulatory Visit: Payer: Self-pay

## 2020-05-24 DIAGNOSIS — F339 Major depressive disorder, recurrent, unspecified: Secondary | ICD-10-CM

## 2020-05-24 MED ORDER — FLUOXETINE HCL 10 MG PO TABS: 10.0000 mg | ORAL_TABLET | Freq: Every day | ORAL | 0 refills | Status: DC

## 2020-05-24 MED ORDER — LORAZEPAM 1 MG PO TABS: ORAL_TABLET | ORAL | 0 refills | Status: DC

## 2020-05-24 NOTE — Telephone Encounter (Signed)
Called pt, no answer. Will continue to call pt to fu.

## 2020-05-31 ENCOUNTER — Ambulatory Visit: Payer: Medicare Other | Admitting: Nurse Practitioner

## 2020-06-10 ENCOUNTER — Other Ambulatory Visit: Payer: Self-pay

## 2020-06-10 ENCOUNTER — Ambulatory Visit (INDEPENDENT_AMBULATORY_CARE_PROVIDER_SITE_OTHER): Payer: Medicare Other | Admitting: Nurse Practitioner

## 2020-06-10 ENCOUNTER — Encounter: Payer: Self-pay | Admitting: Nurse Practitioner

## 2020-06-10 VITALS — BP 130/82 | HR 77 | Temp 98.4°F | Wt 145.0 lb

## 2020-06-10 DIAGNOSIS — E7800 Pure hypercholesterolemia, unspecified: Secondary | ICD-10-CM

## 2020-06-10 DIAGNOSIS — F17210 Nicotine dependence, cigarettes, uncomplicated: Secondary | ICD-10-CM

## 2020-06-10 DIAGNOSIS — K219 Gastro-esophageal reflux disease without esophagitis: Secondary | ICD-10-CM | POA: Diagnosis not present

## 2020-06-10 DIAGNOSIS — G801 Spastic diplegic cerebral palsy: Secondary | ICD-10-CM

## 2020-06-10 DIAGNOSIS — F339 Major depressive disorder, recurrent, unspecified: Secondary | ICD-10-CM | POA: Diagnosis not present

## 2020-06-10 MED ORDER — FLUOXETINE HCL 20 MG PO CAPS: 20.0000 mg | ORAL_CAPSULE | Freq: Every day | ORAL | 0 refills | Status: DC

## 2020-06-10 MED ORDER — FLUOXETINE HCL 10 MG PO TABS: 10.0000 mg | ORAL_TABLET | Freq: Every day | ORAL | 0 refills | Status: DC

## 2020-06-10 MED ORDER — SIMVASTATIN 40 MG PO TABS: 40.0000 mg | ORAL_TABLET | Freq: Every day | ORAL | 0 refills | Status: DC

## 2020-06-10 MED ORDER — LORAZEPAM 1 MG PO TABS: ORAL_TABLET | ORAL | 0 refills | Status: DC

## 2020-06-10 MED ORDER — OMEPRAZOLE 20 MG PO CPDR
20.0000 mg | DELAYED_RELEASE_CAPSULE | Freq: Every day | ORAL | 0 refills | Status: DC
Start: 2020-06-10 — End: 2020-10-04

## 2020-06-10 NOTE — Assessment & Plan Note (Signed)
Continue simvastatin; refill given today.

## 2020-06-10 NOTE — Assessment & Plan Note (Signed)
Chronic, ongoing.  Pain and spacscity well-controlled with baclofen and mobic.  With increased deconditioning and decreased mobility, will order home health physical therapy.  May continue after home health treatment with outpatient physical therapy long-term.  Patient to call with any concerns.

## 2020-06-10 NOTE — Progress Notes (Signed)
BP (!) 130/82    Pulse 77    Temp 98.4 F (36.9 C) (Oral)    Wt 145 lb (65.8 kg)    SpO2 97%    BMI 28.32 kg/m    Subjective:    Patient ID: Michelle Chandler, female    DOB: October 04, 1956, 64 y.o.   MRN: [id number]  HPI: Michelle Chandler is a 64 y.o. female presenting for mood follow up.  She presents to clinic with her mother who is providing transportation today.  Chief Complaint  Patient presents with   Depression   DEPRESSION AND ANXIETY Currently taking Prozac 30 mg daily and feels that is doing a good job of controlling her mood.  She has not had to use the lorazepam at all for a couple of weeks and uses it very sparingly for anxiety. Mood status: controlled Satisfied with current treatment?: yes Symptom severity: mild  Duration of current treatment : chronic Side effects: no Medication compliance: excellent compliance Psychotherapy/counseling: no  Previous psychiatric medications: prozac, lorazepam sparingly Depressed mood: no Anxious mood: no Anhedonia: no Significant weight loss or gain: no Insomnia: yes; trouble falling asleep  Fatigue: yes Feelings of worthlessness or guilt: no Impaired concentration/indecisiveness: no Suicidal ideations: no Hopelessness: no Crying spells: no Depression screen [facility] 2/9 06/10/2020 04/17/2020 02/05/2020 12/29/2019 10/19/2019  Decreased Interest 0 0 0 0 0  Down, Depressed, Hopeless 1 1 0 1 1  PHQ - 2 Score 1 1 0 1 1  Altered sleeping 1 1 2  0 1  Tired, decreased energy 1 0 0 1 1  Change in appetite 0 0 1 0 0  Feeling bad or failure about yourself  1 0 0 0 1  Trouble concentrating 1 1 1  0 0  Moving slowly or fidgety/restless 0 0 1 0 1  Suicidal thoughts 0 0 0 0 0  PHQ-9 Score 5 3 5 2 5   Difficult doing work/chores Somewhat difficult Somewhat difficult - - Not difficult at all   GERD GERD control status: uncontrolled  Satisfied with current treatment? no Heartburn frequency: daily; while eating Medication side effects: no  Medication  compliance: worse Previous GERD medications: Tums Antacid use frequency:  Every other day Duration: minutes Nature: burning Location: epigastric Heartburn duration: minutes Alleviatiating factors:  Aggravating factors:  Dysphagia: yes Odynophagia:  no Hematemesis: no Blood in stool: no EGD: no   SMOKING HISTORY Patient's mother is worried about patient's smoking history - recently had an aunt who was diagnosed with stage IV lung cancer.   Smoking Status: current smoker Smoking Amount: 1 ppd Smoking Onset: 64 years old Smoking Quit Date: not interested in quitting Smoking triggers: stress, enjoyment Type of tobacco use: cigarettes Children in the house: no Other household members who smoke: no  CEREBRAL PALSY Mother reports that Michelle Chandler has been progressively getting weaker and she would like to have somebody help her re-gain her strength.    Allergies  Allergen Reactions   No Known Allergies    Outpatient Encounter Medications as of 06/10/2020  Medication Sig Note   aspirin EC 81 MG tablet Take 81 mg by mouth daily.    baclofen (LIORESAL) 10 MG tablet Take 1 tablet (10 mg total) by mouth 2 (two) times daily.    FLUoxetine (PROZAC) 10 MG tablet Take 1 tablet (10 mg total) by mouth daily.    FLUoxetine (PROZAC) 20 MG capsule Take 1 capsule (20 mg total) by mouth daily.    hydrochlorothiazide (HYDRODIURIL) 25 MG tablet Take 1 tablet (25  mg total) by mouth daily.    ibuprofen (ADVIL) 400 MG tablet Take 1 tablet (400 mg total) by mouth 3 (three) times daily as needed.    Incontinence Supply Disposable (ENTRUST PLUS DISP UNDERPADS) MISC by Does not apply route.    Incontinence Supply Disposable (PROTECTIVE UNDERWEAR LARGE) MISC by Does not apply route.    LORazepam (ATIVAN) 1 MG tablet TAKE ONE TABLET DAILY AS NEEDED FOR ANXIETY.    meloxicam (MOBIC) 15 MG tablet Take 1 tablet (15 mg total) by mouth daily. 05/10/2020: PRN   simvastatin (ZOCOR) 40 MG tablet Take 1  tablet (40 mg total) by mouth daily.    [DISCONTINUED] FLUoxetine (PROZAC) 10 MG tablet Take 1 tablet (10 mg total) by mouth daily.    [DISCONTINUED] FLUoxetine (PROZAC) 20 MG capsule Take 1 capsule (20 mg total) by mouth daily.    [DISCONTINUED] LORazepam (ATIVAN) 1 MG tablet TAKE ONE TABLET DAILY AS NEEDED FOR ANXIETY.    [DISCONTINUED] simvastatin (ZOCOR) 40 MG tablet Take 1 tablet (40 mg total) by mouth daily.    omeprazole (PRILOSEC) 20 MG capsule Take 1 capsule (20 mg total) by mouth daily.    No facility-administered encounter medications on file as of 06/10/2020.   Patient Active Problem List   Diagnosis Date Noted   Gastroesophageal reflux disease 06/10/2020   Smoking greater than 40 pack years 06/10/2020   Cerebral palsy ([place]) 12/27/2015   Hyperlipidemia 12/27/2015   Essential (primary) hypertension 12/27/2015   Depression, recurrent ([place]) 11/20/2015   Past Medical History:  Diagnosis Date   Advance care planning 06/28/2019   Allergic rhinitis    Ataxia    Bursitis of shoulder 11/20/2015   Overview:  Last Assessment & Plan:  Discussed care and treatment shoulder bursitis with meloxicam   CP (cerebral palsy) ([place])    Depression    Diverticulitis    Diverticulosis    Fatigue 02/05/2020   Hyperlipidemia    Hypertension    IFG (impaired fasting glucose)    Venous insufficiency    Venous stasis ulcer ([place])    Vertigo    Relevant past medical, surgical, family and social history reviewed and updated as indicated. Interim medical history since our last visit reviewed.  Review of Systems  Constitutional: Negative.  Negative for activity change, appetite change, fatigue and fever.  HENT: Positive for trouble swallowing (at times).   Eyes: Negative.   Gastrointestinal: Negative for abdominal distention, abdominal pain, diarrhea, nausea and vomiting.       + acid reflux  Skin: Negative.   Neurological: Negative.   Psychiatric/Behavioral: Positive for  sleep disturbance. Negative for agitation, behavioral problems, confusion and decreased concentration. The patient is not nervous/anxious.     Per HPI unless specifically indicated above     Objective:    BP (!) 130/82    Pulse 77    Temp 98.4 F (36.9 C) (Oral)    Wt 145 lb (65.8 kg)    SpO2 97%    BMI 28.32 kg/m   Wt Readings from Last 3 Encounters:  06/10/20 145 lb (65.8 kg)  06/21/19 155 lb (70.3 kg)  09/23/18 160 lb (72.6 kg)    Physical Exam Vitals and nursing note reviewed.  Constitutional:      General: She is not in acute distress.    Appearance: Normal appearance. She is normal weight.  Abdominal:     General: Abdomen is flat. There is no distension.     Palpations: Abdomen is soft.  Neurological:  General: No focal deficit present.     Mental Status: She is alert and oriented to person, place, and time. Mental status is at baseline.  Psychiatric:        Mood and Affect: Mood normal.        Behavior: Behavior normal.        Thought Content: Thought content normal.        Judgment: Judgment normal.       Assessment & Plan:   Problem List Items Addressed This Visit      Digestive   Gastroesophageal reflux disease    Acute, ongoing.  Will start PPI x eight weeks and trial discontinuance.  Follow up in 2-3 weeks for relief of symptoms.  If not better, may consider EGD given intermittent dysphagia and extensive smoking history.      Relevant Medications   omeprazole (PRILOSEC) 20 MG capsule     Nervous and Auditory   Cerebral palsy (HCC)    Chronic, ongoing.  Pain and spacscity well-controlled with baclofen and mobic.  With increased deconditioning and decreased mobility, will order home health physical therapy.  May continue after home health treatment with outpatient physical therapy long-term.  Patient to call with any concerns.      Relevant Orders   Ambulatory referral to [facility]     Other   Hyperlipidemia    Continue simvastatin; refill given  today.      Relevant Medications   simvastatin (ZOCOR) 40 MG tablet   Depression, recurrent (HCC) - Primary    Chronic, stable.  PHQ-9 slightly elevated today, however patient reports good control of mood on Prozac 30 mg.  Continues to use lorazepam very sparingly; refill given to last at least 6 months.  PDMP reviewed and appropriate.        Relevant Medications   FLUoxetine (PROZAC) 10 MG tablet   FLUoxetine (PROZAC) 20 MG capsule   LORazepam (ATIVAN) 1 MG tablet   Smoking greater than 40 pack years    History of 46 pack-year smoking history and current smoker.  Will consult with [facility] to arrange low-dose chest CT for lung cancer screening for patient.  Patient not currently interested in smoking cessation.             Follow up plan: Return for as scheduled.

## 2020-06-10 NOTE — Patient Instructions (Addendum)
Nice seeing you today, Michelle Chandler!    If you would like to try Melatonin for sleep, you can pick this up at the drug store. I would start with a low dose like 3 mg.  I will try to get somebody from [facility] to help you with Physical Therapy.    I will order the low-dose CT scan of your chest to screen for lung cancer.   Lung Cancer Screening A lung cancer screening is a test that checks for lung cancer. Lung cancer screening is done to look for lung cancer in its very early stages, before it spreads and becomes harder to treat and before symptoms appear. Finding cancer early improves the chances of successful treatment. It may save your life. Should I be screened for lung cancer? You should be screened for lung cancer if all of these apply:  You currently smoke or you have quit smoking within the past 15 years.  You are 68-50 years old. Screening may be recommended up to age 28 depending on your overall health and other factors.  You are in good general health.  You have a smoking history of 1 pack a day for 30 years or 2 packs a day for 15 years. Screening may also be recommended if you are at high risk for the disease. You may be at high risk if:  You have a family history of lung cancer.  You have been exposed to asbestos.  You have chronic obstructive pulmonary disease (COPD).  You have a history of previous lung cancer. How often should I be screened for lung cancer?  If you are at risk for lung cancer, it is recommended that you are screened once a year. The recommended screening test is a low-dose CT scan. How can I lower my risk of lung cancer? To lower your risk of developing lung cancer:  If you smoke, stop smoking all tobacco products.  Avoid secondhand smoke.  Avoid exposure to radiation.  Avoid exposure to radon gas. Have your home checked for radon regularly.  Avoid things that cause cancer (carcinogens).  Avoid living or working in places with high air  pollution. Where to find more information Ask your health care provider about the risks and benefits of screening. More information and resources are available from these organizations:  [place] (ACS): www.cancer.org  American Lung Association: www.lung.org Contact a health care provider if:  You start to show symptoms of lung cancer, including: ? Coughing that will not go away. ? Wheezing. ? Chest pain. ? Coughing up blood. ? Shortness of breath. ? Weight loss that cannot be explained. ? Constant fatigue. Summary  Lung cancer screening may find lung cancer before symptoms appear. Finding cancer early improves the chances of successful treatment. It may save your life.  If you are at risk for lung cancer, it is recommended that you are screened once a year. The recommended screening test is a low-dose CT scan.  You can make lifestyle changes to lower your risk of lung cancer.  Ask your health care provider about the risks and benefits of screening. This information is not intended to replace advice given to you by your health care provider. Make sure you discuss any questions you have with your health care provider. Document Revised: 02/24/2019 Document Reviewed: 09/23/2016 Elsevier Patient Education  2020 [employer].

## 2020-06-10 NOTE — Assessment & Plan Note (Signed)
Chronic, stable.  PHQ-9 slightly elevated today, however patient reports good control of mood on Prozac 30 mg.  Continues to use lorazepam very sparingly; refill given to last at least 6 months.  PDMP reviewed and appropriate.

## 2020-06-10 NOTE — Assessment & Plan Note (Signed)
History of 46 pack-year smoking history and current smoker.  Will consult with [facility] to arrange low-dose chest CT for lung cancer screening for patient.  Patient not currently interested in smoking cessation.

## 2020-06-10 NOTE — Assessment & Plan Note (Signed)
Acute, ongoing.  Will start PPI x eight weeks and trial discontinuance.  Follow up in 2-3 weeks for relief of symptoms.  If not better, may consider EGD given intermittent dysphagia and extensive smoking history.

## 2020-06-20 DIAGNOSIS — E7800 Pure hypercholesterolemia, unspecified: Secondary | ICD-10-CM | POA: Diagnosis not present

## 2020-06-20 DIAGNOSIS — I10 Essential (primary) hypertension: Secondary | ICD-10-CM | POA: Diagnosis not present

## 2020-06-20 DIAGNOSIS — K5790 Diverticulosis of intestine, part unspecified, without perforation or abscess without bleeding: Secondary | ICD-10-CM | POA: Diagnosis not present

## 2020-06-20 DIAGNOSIS — F17210 Nicotine dependence, cigarettes, uncomplicated: Secondary | ICD-10-CM | POA: Diagnosis not present

## 2020-06-20 DIAGNOSIS — Z9181 History of falling: Secondary | ICD-10-CM | POA: Diagnosis not present

## 2020-06-20 DIAGNOSIS — I872 Venous insufficiency (chronic) (peripheral): Secondary | ICD-10-CM | POA: Diagnosis not present

## 2020-06-20 DIAGNOSIS — F339 Major depressive disorder, recurrent, unspecified: Secondary | ICD-10-CM | POA: Diagnosis not present

## 2020-06-20 DIAGNOSIS — K219 Gastro-esophageal reflux disease without esophagitis: Secondary | ICD-10-CM | POA: Diagnosis not present

## 2020-06-20 DIAGNOSIS — J309 Allergic rhinitis, unspecified: Secondary | ICD-10-CM | POA: Diagnosis not present

## 2020-06-20 DIAGNOSIS — G809 Cerebral palsy, unspecified: Secondary | ICD-10-CM | POA: Diagnosis not present

## 2020-06-20 DIAGNOSIS — G801 Spastic diplegic cerebral palsy: Secondary | ICD-10-CM | POA: Diagnosis not present

## 2020-06-20 DIAGNOSIS — F419 Anxiety disorder, unspecified: Secondary | ICD-10-CM | POA: Diagnosis not present

## 2020-06-20 DIAGNOSIS — Z7982 Long term (current) use of aspirin: Secondary | ICD-10-CM | POA: Diagnosis not present

## 2020-06-21 ENCOUNTER — Telehealth: Payer: Self-pay | Admitting: Family Medicine

## 2020-06-21 DIAGNOSIS — K219 Gastro-esophageal reflux disease without esophagitis: Secondary | ICD-10-CM | POA: Diagnosis not present

## 2020-06-21 DIAGNOSIS — G801 Spastic diplegic cerebral palsy: Secondary | ICD-10-CM | POA: Diagnosis not present

## 2020-06-21 DIAGNOSIS — E7800 Pure hypercholesterolemia, unspecified: Secondary | ICD-10-CM | POA: Diagnosis not present

## 2020-06-21 DIAGNOSIS — F339 Major depressive disorder, recurrent, unspecified: Secondary | ICD-10-CM | POA: Diagnosis not present

## 2020-06-21 DIAGNOSIS — F419 Anxiety disorder, unspecified: Secondary | ICD-10-CM | POA: Diagnosis not present

## 2020-06-21 DIAGNOSIS — I10 Essential (primary) hypertension: Secondary | ICD-10-CM | POA: Diagnosis not present

## 2020-06-21 NOTE — Telephone Encounter (Signed)
Copied from [place] [id number]. Topic: Medicare AWV >> Jun 21, 2020  1:57 PM Cher Nakai R wrote: Reason for CRM:  Left message for patient to call back and schedule the Medicare Annual Wellness Visit (AWV) virtually.  Last AWV 06/21/2020  Please schedule at anytime with CFP-Nurse Health Advisor.  45 minute appointment  Any questions, please call me at [phone]

## 2020-06-25 DIAGNOSIS — G801 Spastic diplegic cerebral palsy: Secondary | ICD-10-CM | POA: Diagnosis not present

## 2020-06-25 DIAGNOSIS — I10 Essential (primary) hypertension: Secondary | ICD-10-CM | POA: Diagnosis not present

## 2020-06-25 DIAGNOSIS — F339 Major depressive disorder, recurrent, unspecified: Secondary | ICD-10-CM | POA: Diagnosis not present

## 2020-06-25 DIAGNOSIS — F419 Anxiety disorder, unspecified: Secondary | ICD-10-CM | POA: Diagnosis not present

## 2020-06-25 DIAGNOSIS — K219 Gastro-esophageal reflux disease without esophagitis: Secondary | ICD-10-CM | POA: Diagnosis not present

## 2020-06-25 DIAGNOSIS — E7800 Pure hypercholesterolemia, unspecified: Secondary | ICD-10-CM | POA: Diagnosis not present

## 2020-06-27 DIAGNOSIS — I10 Essential (primary) hypertension: Secondary | ICD-10-CM | POA: Diagnosis not present

## 2020-06-27 DIAGNOSIS — E7800 Pure hypercholesterolemia, unspecified: Secondary | ICD-10-CM | POA: Diagnosis not present

## 2020-06-27 DIAGNOSIS — F419 Anxiety disorder, unspecified: Secondary | ICD-10-CM | POA: Diagnosis not present

## 2020-06-27 DIAGNOSIS — G801 Spastic diplegic cerebral palsy: Secondary | ICD-10-CM | POA: Diagnosis not present

## 2020-06-27 DIAGNOSIS — F339 Major depressive disorder, recurrent, unspecified: Secondary | ICD-10-CM | POA: Diagnosis not present

## 2020-06-27 DIAGNOSIS — K219 Gastro-esophageal reflux disease without esophagitis: Secondary | ICD-10-CM | POA: Diagnosis not present

## 2020-07-01 ENCOUNTER — Ambulatory Visit: Payer: Medicare Other | Admitting: Nurse Practitioner

## 2020-07-01 ENCOUNTER — Ambulatory Visit: Payer: Medicare Other | Admitting: Pharmacist

## 2020-07-01 DIAGNOSIS — G801 Spastic diplegic cerebral palsy: Secondary | ICD-10-CM | POA: Diagnosis not present

## 2020-07-01 DIAGNOSIS — E7800 Pure hypercholesterolemia, unspecified: Secondary | ICD-10-CM | POA: Diagnosis not present

## 2020-07-01 DIAGNOSIS — F339 Major depressive disorder, recurrent, unspecified: Secondary | ICD-10-CM | POA: Diagnosis not present

## 2020-07-01 DIAGNOSIS — K219 Gastro-esophageal reflux disease without esophagitis: Secondary | ICD-10-CM | POA: Diagnosis not present

## 2020-07-01 DIAGNOSIS — I10 Essential (primary) hypertension: Secondary | ICD-10-CM | POA: Diagnosis not present

## 2020-07-01 DIAGNOSIS — F419 Anxiety disorder, unspecified: Secondary | ICD-10-CM | POA: Diagnosis not present

## 2020-07-02 ENCOUNTER — Other Ambulatory Visit: Payer: Self-pay | Admitting: Nurse Practitioner

## 2020-07-02 ENCOUNTER — Telehealth: Payer: Self-pay

## 2020-07-02 ENCOUNTER — Ambulatory Visit (INDEPENDENT_AMBULATORY_CARE_PROVIDER_SITE_OTHER): Payer: Medicare Other | Admitting: General Practice

## 2020-07-02 ENCOUNTER — Ambulatory Visit: Payer: Medicare Other | Admitting: Nurse Practitioner

## 2020-07-02 ENCOUNTER — Telehealth: Payer: Medicare Other | Admitting: General Practice

## 2020-07-02 DIAGNOSIS — F339 Major depressive disorder, recurrent, unspecified: Secondary | ICD-10-CM

## 2020-07-02 DIAGNOSIS — G801 Spastic diplegic cerebral palsy: Secondary | ICD-10-CM

## 2020-07-02 DIAGNOSIS — F419 Anxiety disorder, unspecified: Secondary | ICD-10-CM

## 2020-07-02 DIAGNOSIS — E785 Hyperlipidemia, unspecified: Secondary | ICD-10-CM

## 2020-07-02 DIAGNOSIS — E7800 Pure hypercholesterolemia, unspecified: Secondary | ICD-10-CM

## 2020-07-02 DIAGNOSIS — F17210 Nicotine dependence, cigarettes, uncomplicated: Secondary | ICD-10-CM

## 2020-07-02 MED ORDER — SIMVASTATIN 40 MG PO TABS: 40.0000 mg | ORAL_TABLET | Freq: Every day | ORAL | 0 refills | Status: DC

## 2020-07-02 MED ORDER — FLUOXETINE HCL 10 MG PO TABS: 30.0000 mg | ORAL_TABLET | Freq: Every day | ORAL | 0 refills | Status: DC

## 2020-07-02 NOTE — Patient Instructions (Signed)
Visit Information  Goals Addressed              This Visit's Progress   .  RNCM: "I eat regular food" (pt-stated)        Current Barriers:  . Chronic Disease Management support, education, and care coordination needs related to HTN, HLD, Depression, and Cerebral Palsy  Clinical Goal(s) related to HTN, HLD, Depression, and Cerebral Palsy :  Over the next 120 days, patient will:  . Work with the care management team to address educational, disease management, and care coordination needs  . Begin or continue self health monitoring activities as directed today Measure and record blood pressure 4 times per week . Call provider office for new or worsened signs and symptoms Blood pressure findings outside established parameters and New or worsened symptom related to HLD, Depression or Cerebral Palsy . Call care management team with questions or concerns . Verbalize basic understanding of patient centered plan of care established today  Interventions related to HTN, HLD, Depression, and Cerebral Palsy :  . Evaluation of current treatment plans and patient's adherence to plan as established by provider. Saw provider recently and has a new appointment on 07-11-2020 for follow up . Assessed patient understanding of disease states.  The patient denies changes in her condition. Does not like to discuss health at length. Becomes more open with each new outreach. The patient is taking her blood pressure at home. Last reading was 133/67. 07-02-2020- The patient did not know her actual last blood pressure reading but says it was around 124/70 and her aide takes it regularly. The patient has been working with therapy and is getting stronger.  . Assessed patient's education and care coordination needs. Discussed CCM team pharmacist and LCSW. The patient is receptive to CCM team involvement. The patient is working with the LCSW and was given the pharmacist number today to help with medication assistance. Continues  to work with the patient. Has an appointment tomorrow with the LCSW.  [name] Provided disease specific education to patient.  The patient states she is trying to watch her salt intake. The patient is direct with responses to questions. Also discussed if the patient has decreased smoking and the patient has not at this time. Did not wish to discuss smoking cessation further.  . Assessed the patients activity level. The patient is not active and limited in her ability to exercise. She has an aide that comes into the home to help her with ADLS/IADLS.  Also working with PT.  [name] with appropriate clinical care team members regarding patient needs  Patient Self Care Activities related to HTN, HLD, Depression, and Cerebral Palsy :  . Patient is unable to independently self-manage chronic health conditions  Please see past updates related to this goal by clicking on the "Past Updates" button in the selected goal      .  RNCM: Depression and stress        CARE PLAN ENTRY (see longtitudinal plan of care for additional care plan information)  Current Barriers:  [name] Knowledge Deficits related to depression and stress and how to effectively manage . Lacks caregiver support.  . Cognitive Deficits  Nurse Case Manager Clinical Goal(s):  [name] Over the next 120 days, patient will verbalize understanding of plan for effectively managing depression and anxiety . Over the next 120 days, patient will work with pcp, RNCM and LCSW  to address needs related to how to have effective coping skills to deal with depression, stress,  and anxiety . Over the next 120 days, patient will demonstrate a decrease in anxiety exacerbations as evidenced by following the plan of care directed by the pcp, taking medications as prescribed and working with resources to help with coping mechanisms.  . Over the next 120 days, patient will attend all scheduled medical appointments: follow up with pcp after medication changes on  03-04-2020 . Over the next 120 days, patient will demonstrate improved adherence to prescribed treatment plan for depression, anxiety and stress as evidenced byreturn to baseline status related to chronic depression . Over the next 120 days, patient will work with CM clinical social worker to establish rapport and work with patient on effective coping skills when she is feeling more depressed, stressed, or anxious  Interventions:  . Evaluation of current treatment plan related to depression, stress, and anxiety and patient's adherence to plan as established by provider. . Advised patient to discuss her concerns and changes with her pcp. The patient does not like to talk a lot with the [facility] about her depression.  RNCM working with the patient to establish rapport and help the patient feel at ease with discussing her conditions. 05-01-2020: the patient states she is managing her depression well at this time. Is interacting with the LCSW also. 07-02-2020: the patient states her depression is at a good place. She does want to get out more but is mindful of the current situation with COVID19.  Review of safety precautions.  The patient states they had a party for her moms birthday and she enjoyed that. States she was being safe. Each new outreach the patient is more talkative with the RNCM. Is appreciative of calls to check on her.  . Provided education to patient re: keeping appointments, available resources and CCM team here to help with any questions or concerns.  [name] Collaborated with pcp and LCSW regarding patients almost 'flat affect' in her voice and the best way to help the patient with her chronic conditions of depression and anxiety.  05-01-2020: the CCM team is actively working with the patient and establishing rapport with the patient. The patient seems to be more engaged with each new outreach.  . Discussed plans with patient for ongoing care management follow up and provided patient with direct contact  information for care management team . Reviewed scheduled/upcoming provider appointments including: Appointment with pcp on 8-26--2021 . Social Work referral for support with patient with chronic depression and anxiety with effective coping skills. Actively working with the [employer]  Patient Self Care Activities:  . Attends all scheduled provider appointments . Calls provider office for new concerns or questions . Unable to independently manage depression and anxiety . Unable to perform ADLs independently . Unable to perform IADLs independently  Please see past updates related to this goal by clicking on the "Past Updates" button in the selected goal         Patient verbalizes understanding of instructions provided today.   Telephone follow up appointment with care management team member scheduled for: 09-03-2020 11:00  Noreene Larsson RN, MSN, [facility] Mobile: [phone]

## 2020-07-02 NOTE — Telephone Encounter (Signed)
I've not been seeing her regularly, looks like Michelle Chandler has been following her chronic conditions. Will route

## 2020-07-02 NOTE — Telephone Encounter (Signed)
Routing to provider for refills. See message from pharmacist.

## 2020-07-02 NOTE — Chronic Care Management (AMB) (Signed)
Chronic Care Management   Follow Up Note   07/02/2020 Name: Michelle Chandler MRN: [id number] DOB: 03/22/1956  Referred by: Guadalupe Maple, MD Reason for referral : Chronic Care Management (RNCM Chronic Disease Management and Care Coordination Needs )   Michelle Chandler is a 64 y.o. year old female who is a primary care patient of Crissman, Jeannette How, MD. The CCM team was consulted for assistance with chronic disease management and care coordination needs.    Review of patient status, including review of consultants reports, relevant laboratory and other test results, and collaboration with appropriate care team members and the patient's provider was performed as part of comprehensive patient evaluation and provision of chronic care management services.    SDOH (Social Determinants of Health) assessments performed: Yes See Care Plan activities for detailed interventions related to [facility])     Outpatient Encounter Medications as of 07/02/2020  Medication Sig Note  . aspirin EC 81 MG tablet Take 81 mg by mouth daily.   . baclofen (LIORESAL) 10 MG tablet Take 1 tablet (10 mg total) by mouth 2 (two) times daily.   [name] FLUoxetine (PROZAC) 10 MG tablet Take 3 tablets (30 mg total) by mouth daily.   . hydrochlorothiazide (HYDRODIURIL) 25 MG tablet Take 1 tablet (25 mg total) by mouth daily.   [name] ibuprofen (ADVIL) 400 MG tablet Take 1 tablet (400 mg total) by mouth 3 (three) times daily as needed.   . Incontinence Supply Disposable (ENTRUST PLUS DISP UNDERPADS) MISC by Does not apply route.   . Incontinence Supply Disposable (PROTECTIVE UNDERWEAR LARGE) MISC by Does not apply route.   [name] LORazepam (ATIVAN) 1 MG tablet TAKE ONE TABLET DAILY AS NEEDED FOR ANXIETY.   . meloxicam (MOBIC) 15 MG tablet Take 1 tablet (15 mg total) by mouth daily. 05/10/2020: PRN  . omeprazole (PRILOSEC) 20 MG capsule Take 1 capsule (20 mg total) by mouth daily.   . simvastatin (ZOCOR) 40 MG tablet Take 1 tablet (40 mg total) by mouth  daily.    No facility-administered encounter medications on file as of 07/02/2020.     Objective:   Goals Addressed              This Visit's Progress   .  RNCM: "I eat regular food" (pt-stated)        Current Barriers:  . Chronic Disease Management support, education, and care coordination needs related to HTN, HLD, Depression, and Cerebral Palsy  Clinical Goal(s) related to HTN, HLD, Depression, and Cerebral Palsy :  Over the next 120 days, patient will:  . Work with the care management team to address educational, disease management, and care coordination needs  . Begin or continue self health monitoring activities as directed today Measure and record blood pressure 4 times per week . Call provider office for new or worsened signs and symptoms Blood pressure findings outside established parameters and New or worsened symptom related to HLD, Depression or Cerebral Palsy . Call care management team with questions or concerns . Verbalize basic understanding of patient centered plan of care established today  Interventions related to HTN, HLD, Depression, and Cerebral Palsy :  . Evaluation of current treatment plans and patient's adherence to plan as established by provider. Saw provider recently and has a new appointment on 07-11-2020 for follow up . Assessed patient understanding of disease states.  The patient denies changes in her condition. Does not like to discuss health at length. Becomes more open with each new outreach. The  patient is taking her blood pressure at home. Last reading was 133/67. 07-02-2020- The patient did not know her actual last blood pressure reading but says it was around 124/70 and her aide takes it regularly. The patient has been working with therapy and is getting stronger.  . Assessed patient's education and care coordination needs. Discussed CCM team pharmacist and LCSW. The patient is receptive to CCM team involvement. The patient is working with the LCSW  and was given the pharmacist number today to help with medication assistance. Continues to work with the patient. Has an appointment tomorrow with the LCSW.  [name] Provided disease specific education to patient.  The patient states she is trying to watch her salt intake. The patient is direct with responses to questions. Also discussed if the patient has decreased smoking and the patient has not at this time. Did not wish to discuss smoking cessation further.  . Assessed the patients activity level. The patient is not active and limited in her ability to exercise. She has an aide that comes into the home to help her with ADLS/IADLS.  Also working with PT.  [name] with appropriate clinical care team members regarding patient needs  Patient Self Care Activities related to HTN, HLD, Depression, and Cerebral Palsy :  . Patient is unable to independently self-manage chronic health conditions  Please see past updates related to this goal by clicking on the "Past Updates" button in the selected goal      .  RNCM: Depression and stress        CARE PLAN ENTRY (see longtitudinal plan of care for additional care plan information)  Current Barriers:  [name] Knowledge Deficits related to depression and stress and how to effectively manage . Lacks caregiver support.  . Cognitive Deficits  Nurse Case Manager Clinical Goal(s):  [name] Over the next 120 days, patient will verbalize understanding of plan for effectively managing depression and anxiety . Over the next 120 days, patient will work with pcp, RNCM and LCSW  to address needs related to how to have effective coping skills to deal with depression, stress, and anxiety . Over the next 120 days, patient will demonstrate a decrease in anxiety exacerbations as evidenced by following the plan of care directed by the pcp, taking medications as prescribed and working with resources to help with coping mechanisms.  . Over the next 120 days, patient will attend all  scheduled medical appointments: follow up with pcp after medication changes on 03-04-2020 . Over the next 120 days, patient will demonstrate improved adherence to prescribed treatment plan for depression, anxiety and stress as evidenced byreturn to baseline status related to chronic depression . Over the next 120 days, patient will work with CM clinical social worker to establish rapport and work with patient on effective coping skills when she is feeling more depressed, stressed, or anxious  Interventions:  . Evaluation of current treatment plan related to depression, stress, and anxiety and patient's adherence to plan as established by provider. . Advised patient to discuss her concerns and changes with her pcp. The patient does not like to talk a lot with the [facility] about her depression.  RNCM working with the patient to establish rapport and help the patient feel at ease with discussing her conditions. 05-01-2020: the patient states she is managing her depression well at this time. Is interacting with the LCSW also. 07-02-2020: the patient states her depression is at a good place. She does want to get out more but  is mindful of the current situation with COVID19.  Review of safety precautions.  The patient states they had a party for her moms birthday and she enjoyed that. States she was being safe. Each new outreach the patient is more talkative with the RNCM. Is appreciative of calls to check on her.  . Provided education to patient re: keeping appointments, available resources and CCM team here to help with any questions or concerns.  [name] Collaborated with pcp and LCSW regarding patients almost 'flat affect' in her voice and the best way to help the patient with her chronic conditions of depression and anxiety.  05-01-2020: the CCM team is actively working with the patient and establishing rapport with the patient. The patient seems to be more engaged with each new outreach.  . Discussed plans with patient  for ongoing care management follow up and provided patient with direct contact information for care management team . Reviewed scheduled/upcoming provider appointments including: Appointment with pcp on 8-26--2021 . Social Work referral for support with patient with chronic depression and anxiety with effective coping skills. Actively working with the [facility]  Patient Self Care Activities:  . Attends all scheduled provider appointments . Calls provider office for new concerns or questions . Unable to independently manage depression and anxiety . Unable to perform ADLs independently . Unable to perform IADLs independently  Please see past updates related to this goal by clicking on the "Past Updates" button in the selected goal          Plan:   Telephone follow up appointment with care management team member scheduled for: 09-03-2020 at 11:00 am   [place], MSN, [facility] Mobile: [phone]

## 2020-07-02 NOTE — Telephone Encounter (Signed)
Prescriptions sent in

## 2020-07-02 NOTE — Telephone Encounter (Signed)
-----   Message from Vladimir Faster, [facility] sent at 07/01/2020 12:29 PM EDT ----- Hi. Mrs. Tashiro last saw Apolonio Schneiders. I'm not sure if she's here today. Can you please send new prescriptions for fluoxetine 10mg  Take 3 capsules (30 mg daily) and Simvastatin 40 mg qd to [facility]? She has been out of Simvastatin 20mg  for approximately 1 month and Medicaid will not fill two different strengths of fluoxetine (patient was to fill 10 mg to take in addition to 20 mg ).  Thank You, Almyra Free

## 2020-07-03 ENCOUNTER — Ambulatory Visit: Payer: Medicare Other | Admitting: Licensed Clinical Social Worker

## 2020-07-03 DIAGNOSIS — F339 Major depressive disorder, recurrent, unspecified: Secondary | ICD-10-CM

## 2020-07-03 DIAGNOSIS — E7800 Pure hypercholesterolemia, unspecified: Secondary | ICD-10-CM | POA: Diagnosis not present

## 2020-07-03 DIAGNOSIS — I10 Essential (primary) hypertension: Secondary | ICD-10-CM | POA: Diagnosis not present

## 2020-07-03 DIAGNOSIS — K219 Gastro-esophageal reflux disease without esophagitis: Secondary | ICD-10-CM | POA: Diagnosis not present

## 2020-07-03 DIAGNOSIS — G801 Spastic diplegic cerebral palsy: Secondary | ICD-10-CM

## 2020-07-03 DIAGNOSIS — F419 Anxiety disorder, unspecified: Secondary | ICD-10-CM | POA: Diagnosis not present

## 2020-07-03 DIAGNOSIS — E785 Hyperlipidemia, unspecified: Secondary | ICD-10-CM

## 2020-07-03 NOTE — Chronic Care Management (AMB) (Signed)
Chronic Care Management    Clinical Social Work Follow Up Note  07/03/2020 Name: Michelle Chandler MRN: [id number] DOB: 1956-03-30  Michelle Chandler is a 64 y.o. year old female who is a primary care patient of Crissman, Jeannette How, MD. The CCM team was consulted for assistance with Level of Care Concerns.   Review of patient status, including review of consultants reports, other relevant assessments, and collaboration with appropriate care team members and the patient's provider was performed as part of comprehensive patient evaluation and provision of chronic care management services.    SDOH (Social Determinants of Health) assessments performed: Yes    Outpatient Encounter Medications as of 07/03/2020  Medication Sig Note  . aspirin EC 81 MG tablet Take 81 mg by mouth daily.   . baclofen (LIORESAL) 10 MG tablet Take 1 tablet (10 mg total) by mouth 2 (two) times daily.   [name] FLUoxetine (PROZAC) 10 MG tablet Take 3 tablets (30 mg total) by mouth daily.   . hydrochlorothiazide (HYDRODIURIL) 25 MG tablet Take 1 tablet (25 mg total) by mouth daily.   [name] ibuprofen (ADVIL) 400 MG tablet Take 1 tablet (400 mg total) by mouth 3 (three) times daily as needed.   . Incontinence Supply Disposable (ENTRUST PLUS DISP UNDERPADS) MISC by Does not apply route.   . Incontinence Supply Disposable (PROTECTIVE UNDERWEAR LARGE) MISC by Does not apply route.   [name] LORazepam (ATIVAN) 1 MG tablet TAKE ONE TABLET DAILY AS NEEDED FOR ANXIETY.   . meloxicam (MOBIC) 15 MG tablet Take 1 tablet (15 mg total) by mouth daily. 05/10/2020: PRN  . omeprazole (PRILOSEC) 20 MG capsule Take 1 capsule (20 mg total) by mouth daily.   . simvastatin (ZOCOR) 40 MG tablet Take 1 tablet (40 mg total) by mouth daily.    No facility-administered encounter medications on file as of 07/03/2020.     Goals Addressed    .  SW: "My depression is getting better." (pt-stated)        Current Barriers:  . Chronic Mental Health needs related to depression and  stress . Limited social support . Mental Health Concerns  . Social Isolation . Suicidal Ideation/Homicidal Ideation: No  Clinical Social Work Goal(s):  [name] Over the next 120 days, patient will work with SW  bi-monthly  by telephone or in person to reduce or manage symptoms related to depression . Over the next 120 days, patient will demonstrate improved health management independence as evidenced by implementing healthy self-care and positive support/resources into her daily routine to cope with stressors and improve overall health and well-being  . Over the next 120 days, patient will work with SW to address concerns related to gaining additional support/resource connection in order to maintain health . Over the next 120 days, patient will demonstrate improved adherence to self care as evidenced by implementing healthy self-care into her daily routine such as: attending all medical appointments, deep breathing exercsies, taking time for self-reflection, taking medications as prescribed, drinking water and daily exercise to improve mobility.   Interventions: . Patient interviewed and appropriate assessments performed: PHQ 2 and PHQ 9 . Positive reinforcement provided for positive self-care implementation over the last 60 days. Patient reports that she has intentionally made the effort to be outside to gain vitamin D. Patient also shares that she has increased her socialization. Patient reports having a strong support network from her friends, family and neighbors who visit her regularly.  . Provided patient with information about available mental health support resources  and socialization opportunities for seniors within her nearby area.  . Discussed plans with patient for ongoing care management follow up and provided patient with direct contact information for care management team . LCSW was able to successfully build rapport with patient. [name] LCSW discussed coping skills for anxiety. SW used  empathetic and active and reflective listening, validated patient's feelings/concerns, and provided emotional support. LCSW provided self-care education to help manage her multiple health conditions and improve her mood.  [name] with [facility] Pharmacist re: New referral placed on 07/03/20. Patient is having issues with keeping up with refilling her medications. She is wondering if Pharmacy can assist her with adjusting her medications to 90 day refills instead of 30 day. She reports that some of her medications have no refills. She reports ongoing stress with medication management and is wondering if there is an easier way to manage all of her medications.  . Assisted patient/caregiver with obtaining information about health plan benefits . Referred patient to [facility] for long term follow up and therapy/counseling. Patient reports that her recent increase in her antidepressant is effectively working for her. Patient denied needing further mental health support at this time and declined the need for therapy.  . Solution-Focused Strategies implemented during session today.  Patient Self Care Activities:  . Calls provider office for new concerns or questions . Ability for insight . Independent living . Motivation for treatment  Patient Coping Strengths:  . Supportive Relationships . Hopefulness . Able to Communicate Effectively  Patient Self Care Deficits:  . Unable to independently keep up with medication refills . Lacks social connections  Initial goal documentation      Follow Up Plan: SW will follow up with patient by phone over the next quarter  Eula Fried, [place], MSW, [email or web address] Phone: [phone]

## 2020-07-04 DIAGNOSIS — F339 Major depressive disorder, recurrent, unspecified: Secondary | ICD-10-CM | POA: Diagnosis not present

## 2020-07-04 DIAGNOSIS — E7800 Pure hypercholesterolemia, unspecified: Secondary | ICD-10-CM | POA: Diagnosis not present

## 2020-07-04 DIAGNOSIS — K219 Gastro-esophageal reflux disease without esophagitis: Secondary | ICD-10-CM | POA: Diagnosis not present

## 2020-07-04 DIAGNOSIS — G801 Spastic diplegic cerebral palsy: Secondary | ICD-10-CM | POA: Diagnosis not present

## 2020-07-04 DIAGNOSIS — I10 Essential (primary) hypertension: Secondary | ICD-10-CM | POA: Diagnosis not present

## 2020-07-04 DIAGNOSIS — F419 Anxiety disorder, unspecified: Secondary | ICD-10-CM | POA: Diagnosis not present

## 2020-07-04 NOTE — Chronic Care Management (AMB) (Signed)
Chronic Care Management   Follow Up Note   07/04/2020 Name: Michelle Chandler MRN: [id number] DOB: [birth year]  Referred by: Guadalupe Maple, MD Reason for referral : Chronic Care Management (follow up (depression, HLD))   Michelle Chandler is a 64 y.o. year old female who is a primary care patient of Crissman, Jeannette How, MD. The CCM team was consulted for assistance with chronic disease management and care coordination needs.    Review of patient status, including review of consultants reports, relevant laboratory and other test results, and collaboration with appropriate care team members and the patient's provider was performed as part of comprehensive patient evaluation and provision of chronic care management services.    SDOH (Social Determinants of Health) assessments performed: No See Care Plan activities for detailed interventions related to [facility])     Outpatient Encounter Medications as of 07/01/2020  Medication Sig Note  . aspirin EC 81 MG tablet Take 81 mg by mouth daily.   . baclofen (LIORESAL) 10 MG tablet Take 1 tablet (10 mg total) by mouth 2 (two) times daily.   [name] FLUoxetine (PROZAC) 10 MG tablet Take 3 tablets (30 mg total) by mouth daily.   . hydrochlorothiazide (HYDRODIURIL) 25 MG tablet Take 1 tablet (25 mg total) by mouth daily.   [name] ibuprofen (ADVIL) 400 MG tablet Take 1 tablet (400 mg total) by mouth 3 (three) times daily as needed.   . Incontinence Supply Disposable (ENTRUST PLUS DISP UNDERPADS) MISC by Does not apply route.   . Incontinence Supply Disposable (PROTECTIVE UNDERWEAR LARGE) MISC by Does not apply route.   [name] LORazepam (ATIVAN) 1 MG tablet TAKE ONE TABLET DAILY AS NEEDED FOR ANXIETY.   . meloxicam (MOBIC) 15 MG tablet Take 1 tablet (15 mg total) by mouth daily. 05/10/2020: PRN  . omeprazole (PRILOSEC) 20 MG capsule Take 1 capsule (20 mg total) by mouth daily.   . [DISCONTINUED] FLUoxetine (PROZAC) 10 MG tablet Take 1 tablet (10 mg total) by mouth daily.   .  [DISCONTINUED] FLUoxetine (PROZAC) 20 MG capsule Take 1 capsule (20 mg total) by mouth daily.   . [DISCONTINUED] simvastatin (ZOCOR) 40 MG tablet Take 1 tablet (40 mg total) by mouth daily.    No facility-administered encounter medications on file as of 07/01/2020.     Objective:   Goals Addressed              This Visit's Progress   .  PharmD "I want to keep my medications organized" (pt-stated)        CARE PLAN ENTRY (see longitudinal plan of care for additional care plan information)  Current Barriers:  . Polypharmacy; complex patient with multiple comorbidities including HTN, cerebral palsy, depression, HLD . Notes today that she has been out of  simvastatin approximately one month. Reports pharmacy told her it was too early to fill. Increased to 40 mg in March for LDL 124. Per fill history available in computer 40 mg dose has not been filled so patient had been taking 2 tablets. Will ask provider to resend RX for 40 mg dose. Patient also states Pharmacy could not fill her 10 mg fluoxetine and she will be running out. Was to take 39m + 20 mg for a 341mdaily dose. Will coordinate new RX with provider. . Does not use a pill box. Notes she does not need.  . Most recent eGFR: 72 mL/min o Depression: fluoxetine 30 mg daily,  lorazepam 1 mg daily for anxiety, sleep o HTN: HCTZ 25  mg daily o Pain/muscle spasm: meloxicam 15 mg daily, ibuprofen 400 mg daily - reports that she uses these sparingly, yet requests refill for 90 day supplies on both. o ASCVD risk reduction: ASA 81 mg daily o HLD: simvastatin 40 mg daily ( ran out ~ 1 month ago had been taking 2  x 20 mg )  Pharmacist Clinical Goal(s):  [name] Over the next 90 days, patient will work with PharmD and provider towards optimized medication management  Interventions: . Comprehensive medication review performed; medication list updated in electronic medical record . Inter-disciplinary care team collaboration (see longitudinal plan of  care) .  Will collaborate w/ PCP to send fluxoetine 30 mg daily and simvastatin 40 mg daily 90 day supply to pharmacy. . Reviewed that patient has an upcoming appointment in August. She should discuss 90 day supplies w/ refills at that appointment  Patient Self Care Activities:  . Patient will take medications as prescribed  Initial goal documentation         Plan:   Telephone follow up appointment with care management team member scheduled for: September   Kavina Cantave S. Kenton Kingfisher PharmD, [facility] [phone]

## 2020-07-04 NOTE — Patient Instructions (Addendum)
Visit Information Michelle Chandler,   It was a pleasure speaking with you today. Thank you for letting me be part of your clinical team. Please call with any questions or concerns. [place], PharmD - Pharmacist  Goals Addressed              This Visit's Progress   .  PharmD "I want to keep my medications organized" (pt-stated)        CARE PLAN ENTRY (see longitudinal plan of care for additional care plan information)  Current Barriers:  . Polypharmacy; complex patient with multiple comorbidities including HTN, cerebral palsy, depression, HLD . Notes today that she has been out of  simvastatin approximately one month. Reports pharmacy told her it was too early to fill. Increased to 40 mg in March for LDL 124. Per fill history available in computer 40 mg dose has not been filled so patient had been taking 2 tablets. Will ask provider to resend RX for 40 mg dose. Patient also states Pharmacy could not fill her 10 mg fluoxetine and she will be running out. Was to take 75m + 20 mg for a 32mdaily dose. Will coordinate new RX with provider. . Does not use a pill box. Notes she does not need.  . Most recent eGFR: 72 mL/min o Depression: fluoxetine 30 mg daily,  lorazepam 1 mg daily for anxiety, sleep o HTN: HCTZ 25 mg daily o Pain/muscle spasm: meloxicam 15 mg daily, ibuprofen 400 mg daily - reports that she uses these sparingly, yet requests refill for 90 day supplies on both. o ASCVD risk reduction: ASA 81 mg daily o HLD: simvastatin 40 mg daily ( ran out ~ 1 month ago had been taking 2  x 20 mg )  Pharmacist Clinical Goal(s):  . Marland Kitchenver the next 90 days, patient will work with PharmD and provider towards optimized medication management  Interventions: . Comprehensive medication review performed; medication list updated in electronic medical record . Inter-disciplinary care team collaboration (see longitudinal plan of care) .  Will collaborate w/ PCP to send fluxoetine 30 mg  daily and simvastatin 40 mg daily 90 day supply to pharmacy. . Reviewed that patient has an upcoming appointment in August. She should discuss 90 day supplies w/ refills at that appointment  Patient Self Care Activities:  . Patient will take medications as prescribed  Initial goal documentation        The patient verbalized understanding of instructions provided today and agreed to receive a mailed copy of patient instruction and/or educational materials.  Telephone follow up appointment with pharmacy team member scheduled for: September  Ibrohim Simmers S. Geovonni Meyerhoff PharmD, BCPS Clinical Pharmacist [phone] Cholesterol  High cholesterol is a condition in which the blood has high levels of a white, waxy, fat-like substance (cholesterol). The human body needs small amounts of cholesterol. The liver makes all the cholesterol that the body needs. Extra (excess) cholesterol comes from the food that we eat. Cholesterol is carried from the liver by the blood through the blood vessels. If you have high cholesterol, deposits (plaques) may build up on the walls of your blood vessels (arteries). Plaques make the arteries narrower and stiffer. Cholesterol plaques increase your risk for heart attack and stroke. Work with your health care provider to keep your cholesterol levels in a healthy range. What increases the risk? This condition is more likely to develop in people who:  Eat foods that are high in animal fat (saturated fat) or cholesterol.  Are overweight.  Are not getting enough exercise.  Have a family history of high cholesterol. What are the signs or symptoms? There are no symptoms of this condition. How is this diagnosed? This condition may be diagnosed from the results of a blood test.  If you are older than age 53, your health care provider may check your cholesterol every 4-6 years.  You may be checked more often if you already have high cholesterol or other risk factors for  heart disease. The blood test for cholesterol measures:  "Bad" cholesterol (LDL cholesterol). This is the main type of cholesterol that causes heart disease. The desired level for LDL is less than 100.  "Good" cholesterol (HDL cholesterol). This type helps to protect against heart disease by cleaning the arteries and carrying the LDL away. The desired level for HDL is 60 or higher.  Triglycerides. These are fats that the body can store or burn for energy. The desired number for triglycerides is lower than 150.  Total cholesterol. This is a measure of the total amount of cholesterol in your blood, including LDL cholesterol, HDL cholesterol, and triglycerides. A healthy number is less than 200. How is this treated? This condition is treated with diet changes, lifestyle changes, and medicines. Diet changes  This may include eating more whole grains, fruits, vegetables, nuts, and fish.  This may also include cutting back on red meat and foods that have a lot of added sugar. Lifestyle changes  Changes may include getting at least 40 minutes of aerobic exercise 3 times a week. Aerobic exercises include walking, biking, and swimming. Aerobic exercise along with a healthy diet can help you maintain a healthy weight.  Changes may also include quitting smoking. Medicines  Medicines are usually given if diet and lifestyle changes have failed to reduce your cholesterol to healthy levels.  Your health care provider may prescribe a statin medicine. Statin medicines have been shown to reduce cholesterol, which can reduce the risk of heart disease. Follow these instructions at home: Eating and drinking If told by your health care provider:  Eat chicken (without skin), fish, veal, shellfish, ground [place] breast, and round or loin cuts of red meat.  Do not eat fried foods or fatty meats, such as hot dogs and salami.  Eat plenty of fruits, such as apples.  Eat plenty of vegetables, such as  broccoli, potatoes, and carrots.  Eat beans, peas, and lentils.  Eat grains such as barley, rice, couscous, and bulgur wheat.  Eat pasta without cream sauces.  Use skim or nonfat milk, and eat low-fat or nonfat yogurt and cheeses.  Do not eat or drink whole milk, cream, ice cream, egg yolks, or hard cheeses.  Do not eat stick margarine or tub margarines that contain trans fats (also called partially hydrogenated oils).  Do not eat saturated tropical oils, such as coconut oil and palm oil.  Do not eat cakes, cookies, crackers, or other baked goods that contain trans fats.  General instructions  Exercise as directed by your health care provider. Increase your activity level with activities such as gardening, walking, and taking the stairs.  Take over-the-counter and prescription medicines only as told by your health care provider.  Do not use any products that contain nicotine or tobacco, such as cigarettes and e-cigarettes. If you need help quitting, ask your health care provider.  Keep all follow-up visits as told by your health care provider. This is important. Contact a health care provider if:  You are  struggling to maintain a healthy diet or weight.  You need help to start on an exercise program.  You need help to stop smoking. Get help right away if:  You have chest pain.  You have trouble breathing. This information is not intended to replace advice given to you by your health care provider. Make sure you discuss any questions you have with your health care provider. Document Revised: 11/05/2017 Document Reviewed: 05/02/2016 Elsevier Patient Education  [place].

## 2020-07-08 ENCOUNTER — Ambulatory Visit: Payer: Medicare Other | Admitting: Nurse Practitioner

## 2020-07-08 ENCOUNTER — Telehealth: Payer: Self-pay

## 2020-07-08 NOTE — Telephone Encounter (Signed)
This nurse received message from the office that patient needed to cancel our appointment for today and for me to contact her in order to reschedule. I attempted to call patient to reschedule. Voice message left for patient.

## 2020-07-09 DIAGNOSIS — F419 Anxiety disorder, unspecified: Secondary | ICD-10-CM | POA: Diagnosis not present

## 2020-07-09 DIAGNOSIS — K219 Gastro-esophageal reflux disease without esophagitis: Secondary | ICD-10-CM | POA: Diagnosis not present

## 2020-07-09 DIAGNOSIS — I10 Essential (primary) hypertension: Secondary | ICD-10-CM | POA: Diagnosis not present

## 2020-07-09 DIAGNOSIS — G801 Spastic diplegic cerebral palsy: Secondary | ICD-10-CM | POA: Diagnosis not present

## 2020-07-09 DIAGNOSIS — E7800 Pure hypercholesterolemia, unspecified: Secondary | ICD-10-CM | POA: Diagnosis not present

## 2020-07-09 DIAGNOSIS — F339 Major depressive disorder, recurrent, unspecified: Secondary | ICD-10-CM | POA: Diagnosis not present

## 2020-07-10 DIAGNOSIS — E7800 Pure hypercholesterolemia, unspecified: Secondary | ICD-10-CM | POA: Diagnosis not present

## 2020-07-10 DIAGNOSIS — G801 Spastic diplegic cerebral palsy: Secondary | ICD-10-CM | POA: Diagnosis not present

## 2020-07-10 DIAGNOSIS — F339 Major depressive disorder, recurrent, unspecified: Secondary | ICD-10-CM | POA: Diagnosis not present

## 2020-07-10 DIAGNOSIS — K219 Gastro-esophageal reflux disease without esophagitis: Secondary | ICD-10-CM | POA: Diagnosis not present

## 2020-07-10 DIAGNOSIS — I10 Essential (primary) hypertension: Secondary | ICD-10-CM | POA: Diagnosis not present

## 2020-07-10 DIAGNOSIS — F419 Anxiety disorder, unspecified: Secondary | ICD-10-CM | POA: Diagnosis not present

## 2020-07-11 ENCOUNTER — Ambulatory Visit (INDEPENDENT_AMBULATORY_CARE_PROVIDER_SITE_OTHER): Payer: Medicare Other | Admitting: Nurse Practitioner

## 2020-07-11 ENCOUNTER — Encounter: Payer: Self-pay | Admitting: Nurse Practitioner

## 2020-07-11 ENCOUNTER — Other Ambulatory Visit: Payer: Self-pay

## 2020-07-11 VITALS — BP 130/79 | HR 77 | Temp 98.4°F | Wt 146.0 lb

## 2020-07-11 DIAGNOSIS — E7800 Pure hypercholesterolemia, unspecified: Secondary | ICD-10-CM

## 2020-07-11 DIAGNOSIS — K219 Gastro-esophageal reflux disease without esophagitis: Secondary | ICD-10-CM | POA: Diagnosis not present

## 2020-07-11 DIAGNOSIS — I10 Essential (primary) hypertension: Secondary | ICD-10-CM

## 2020-07-11 DIAGNOSIS — G801 Spastic diplegic cerebral palsy: Secondary | ICD-10-CM | POA: Diagnosis not present

## 2020-07-11 DIAGNOSIS — F339 Major depressive disorder, recurrent, unspecified: Secondary | ICD-10-CM | POA: Diagnosis not present

## 2020-07-11 DIAGNOSIS — Z1211 Encounter for screening for malignant neoplasm of colon: Secondary | ICD-10-CM

## 2020-07-11 MED ORDER — FLUOXETINE HCL 10 MG PO TABS: 30.0000 mg | ORAL_TABLET | Freq: Every day | ORAL | 1 refills | Status: DC

## 2020-07-11 MED ORDER — MELOXICAM 15 MG PO TABS: 15.0000 mg | ORAL_TABLET | Freq: Every day | ORAL | 1 refills | Status: DC

## 2020-07-11 MED ORDER — BACLOFEN 10 MG PO TABS: 10.0000 mg | ORAL_TABLET | Freq: Two times a day (BID) | ORAL | 1 refills | Status: DC

## 2020-07-11 MED ORDER — SIMVASTATIN 40 MG PO TABS: 40.0000 mg | ORAL_TABLET | Freq: Every day | ORAL | 1 refills | Status: DC

## 2020-07-11 MED ORDER — HYDROCHLOROTHIAZIDE 25 MG PO TABS: 25.0000 mg | ORAL_TABLET | Freq: Every day | ORAL | 1 refills | Status: DC

## 2020-07-11 NOTE — Assessment & Plan Note (Signed)
Chronic, ongoing.  Continue increased dose of simvastatin.  Refills sent in.  Labs checked today.

## 2020-07-11 NOTE — Progress Notes (Signed)
BP 130/79   Pulse 77   Temp 98.4 F (36.9 C) (Oral)   Wt 146 lb (66.2 kg)   SpO2 96%   BMI 28.51 kg/m    Subjective:    Patient ID: Michelle Chandler, female    DOB: January 19, 1956, 64 y.o.   MRN: [id number]  HPI: Michelle Chandler is a 64 y.o. female presenting for follow up.  Chief Complaint  Patient presents with  . Depression    pt wants a cologuard order. she missed the last order  . Hypertension  . Hyperlipidemia    pt states she has been out of simvastatin x about a month. pt requested to swetche all medications to your name so she can fill them for her insurance request    Reports Physical Therapy is going very well and she was able to walk into the office today when usually, she has to use a wheelchair.  HYPERTENSION / HYPERLIPIDEMIA Satisfied with current treatment? yes Duration of hypertension: chronic BP monitoring frequency: daily BP range: 138/70 BP medication side effects: no Past BP meds: HCTZ Duration of hyperlipidemia: chronic Cholesterol medication side effects: no Cholesterol supplements: none Past cholesterol medications: simvastatin 40 mg Medication compliance: excellent compliance Aspirin: yes Recent stressors: no Recurrent headaches: no Visual changes: no Palpitations: no Dyspnea: no Chest pain: no Lower extremity edema: no Dizzy/lightheaded: no  DEPRESSION Mood status: controlled Satisfied with current treatment?: no Symptom severity: mild  Duration of current treatment : chronic Side effects: no Medication compliance: excellent compliance Psychotherapy/counseling: no  Previous psychiatric medications: fluoxetine, ativan as needed Depressed mood: yes Anxious mood: no Anhedonia: yes Significant weight loss or gain: no Insomnia: no  Fatigue: yes Feelings of worthlessness or guilt: no Impaired concentration/indecisiveness: no Suicidal ideations: no Hopelessness: no Crying spells: no Depression screen [facility] 2/9 07/11/2020 06/10/2020 04/17/2020  02/05/2020 12/29/2019  Decreased Interest 2 0 0 0 0  Down, Depressed, Hopeless 3 1 1  0 1  PHQ - 2 Score 5 1 1  0 1  Altered sleeping 1 1 1 2  0  Tired, decreased energy 2 1 0 0 1  Change in appetite 2 0 0 1 0  Feeling bad or failure about yourself  2 1 0 0 0  Trouble concentrating 2 1 1 1  0  Moving slowly or fidgety/restless 2 0 0 1 0  Suicidal thoughts 0 0 0 0 0  PHQ-9 Score 16 5 3 5 2   Difficult doing work/chores Not difficult at all Somewhat difficult Somewhat difficult - -  Some recent data might be hidden   GERD GERD control status: controlled  Satisfied with current treatment? yes Heartburn frequency: rarely Medication side effects: no  Medication compliance: better Previous GERD medications: omeprazole 20 mg daily Dysphagia: no Odynophagia:  no Hematemesis: no Blood in stool: no EGD: no   Allergies  Allergen Reactions  . No Known Allergies    Outpatient Encounter Medications as of 07/11/2020  Medication Sig Note  . aspirin EC 81 MG tablet Take 81 mg by mouth daily.   . baclofen (LIORESAL) 10 MG tablet Take 1 tablet (10 mg total) by mouth 2 (two) times daily.   [name] FLUoxetine (PROZAC) 10 MG tablet Take 3 tablets (30 mg total) by mouth daily.   . hydrochlorothiazide (HYDRODIURIL) 25 MG tablet Take 1 tablet (25 mg total) by mouth daily.   [name] ibuprofen (ADVIL) 400 MG tablet Take 1 tablet (400 mg total) by mouth 3 (three) times daily as needed.   . Incontinence Supply Disposable (ENTRUST  PLUS DISP UNDERPADS) MISC by Does not apply route.   . Incontinence Supply Disposable (PROTECTIVE UNDERWEAR LARGE) MISC by Does not apply route.   [name] LORazepam (ATIVAN) 1 MG tablet TAKE ONE TABLET DAILY AS NEEDED FOR ANXIETY.   . meloxicam (MOBIC) 15 MG tablet Take 1 tablet (15 mg total) by mouth daily.   [name] omeprazole (PRILOSEC) 20 MG capsule Take 1 capsule (20 mg total) by mouth daily.   . [DISCONTINUED] baclofen (LIORESAL) 10 MG tablet Take 1 tablet (10 mg total) by mouth 2 (two) times daily.    . [DISCONTINUED] FLUoxetine (PROZAC) 10 MG tablet Take 3 tablets (30 mg total) by mouth daily.   . [DISCONTINUED] hydrochlorothiazide (HYDRODIURIL) 25 MG tablet Take 1 tablet (25 mg total) by mouth daily.   . [DISCONTINUED] meloxicam (MOBIC) 15 MG tablet Take 1 tablet (15 mg total) by mouth daily. 05/10/2020: PRN  . simvastatin (ZOCOR) 40 MG tablet Take 1 tablet (40 mg total) by mouth daily.   . [DISCONTINUED] simvastatin (ZOCOR) 40 MG tablet Take 1 tablet (40 mg total) by mouth daily. (Patient not taking: Reported on 07/11/2020)    No facility-administered encounter medications on file as of 07/11/2020.   Patient Active Problem List   Diagnosis Date Noted  . Gastroesophageal reflux disease 06/10/2020  . Smoking greater than 40 pack years 06/10/2020  . Cerebral palsy ([place]) 12/27/2015  . Hyperlipidemia 12/27/2015  . Essential (primary) hypertension 12/27/2015  . Depression, recurrent ([place]) 11/20/2015   Past Medical History:  Diagnosis Date  . Advance care planning 06/28/2019  . Allergic rhinitis   . Ataxia   . Bursitis of shoulder 11/20/2015   Overview:  Last Assessment & Plan:  Discussed care and treatment shoulder bursitis with meloxicam  . CP (cerebral palsy) ([place])   . Depression   . Diverticulitis   . Diverticulosis   . Fatigue 02/05/2020  . Hyperlipidemia   . Hypertension   . IFG (impaired fasting glucose)   . Venous insufficiency   . Venous stasis ulcer ([place])   . Vertigo    Relevant past medical, surgical, family and social history reviewed and updated as indicated. Interim medical history since our last visit reviewed.  Review of Systems  Constitutional: Negative.  Negative for activity change, appetite change, fatigue and fever.  Eyes: Negative.  Negative for visual disturbance.  Respiratory: Negative.  Negative for cough, chest tightness, shortness of breath and wheezing.   Cardiovascular: Negative for chest pain, palpitations and leg swelling.  Gastrointestinal:  Negative.  Negative for abdominal distention, blood in stool, constipation, diarrhea, nausea and vomiting.  Endocrine: Negative.  Negative for polydipsia and polyuria.  Musculoskeletal: Negative.   Skin: Negative.   Neurological: Negative.  Negative for dizziness, weakness, light-headedness, numbness and headaches.  Psychiatric/Behavioral: Negative.  Negative for behavioral problems, confusion, sleep disturbance and suicidal ideas. The patient is not nervous/anxious.     Per HPI unless specifically indicated above     Objective:    BP 130/79   Pulse 77   Temp 98.4 F (36.9 C) (Oral)   Wt 146 lb (66.2 kg)   SpO2 96%   BMI 28.51 kg/m   Wt Readings from Last 3 Encounters:  07/11/20 146 lb (66.2 kg)  06/10/20 145 lb (65.8 kg)  06/21/19 155 lb (70.3 kg)    Physical Exam Vitals and nursing note reviewed.  Constitutional:      General: She is not in acute distress.    Appearance: Normal appearance. She is obese. She is not  toxic-appearing.  HENT:     Head: Normocephalic and atraumatic.     Right Ear: External ear normal.     Left Ear: External ear normal.  Eyes:     General: No scleral icterus.    Extraocular Movements: Extraocular movements intact.  Neck:     Vascular: No carotid bruit.  Cardiovascular:     Rate and Rhythm: Normal rate and regular rhythm.     Heart sounds: Normal heart sounds. No murmur heard.   Pulmonary:     Effort: Pulmonary effort is normal. No respiratory distress.     Breath sounds: Normal breath sounds. No wheezing, rhonchi or rales.  Abdominal:     General: Abdomen is flat. Bowel sounds are normal.     Palpations: Abdomen is soft.     Tenderness: There is no abdominal tenderness.  Musculoskeletal:        General: No swelling or tenderness. Normal range of motion.     Cervical back: Normal range of motion.  Skin:    General: Skin is warm and dry.     Coloration: Skin is not jaundiced or pale.  Neurological:     General: No focal deficit  present.     Mental Status: She is alert and oriented to person, place, and time.     Motor: No weakness.  Psychiatric:        Mood and Affect: Mood normal.        Behavior: Behavior normal.        Thought Content: Thought content normal.        Judgment: Judgment normal.       Assessment & Plan:   Problem List Items Addressed This Visit      Cardiovascular and Mediastinum   Essential (primary) hypertension    Chronic, stable.  Appears to be well-controlled on HCTZ, continue this medication for now.  Labs checked today.  Follow up in 3 months.      Relevant Medications   hydrochlorothiazide (HYDRODIURIL) 25 MG tablet   simvastatin (ZOCOR) 40 MG tablet   Other Relevant Orders   CBC with Differential/Platelet   Comprehensive metabolic panel   TSH     Digestive   Gastroesophageal reflux disease    Acute, improving with PPI.  Continue for now, will trial discontinuance at next visit and if persistent, may consider GI referral with EGD.        Nervous and Auditory   Cerebral palsy (HCC)    Chronic, ongoing.  Continue baclofen for muscle spasms daily and meloxicam as needed for pain.  Refills sent in.  Continue with Physical Therapy.      Relevant Medications   baclofen (LIORESAL) 10 MG tablet   meloxicam (MOBIC) 15 MG tablet     Other   Hyperlipidemia    Chronic, ongoing.  Continue increased dose of simvastatin.  Refills sent in.  Labs checked today.      Relevant Medications   hydrochlorothiazide (HYDRODIURIL) 25 MG tablet   simvastatin (ZOCOR) 40 MG tablet   Other Relevant Orders   Lipid Panel w/o Chol/HDL Ratio   Depression, recurrent (HCC)    Chronic, stable.  Good control on prozac 30 mg daily, continue this treatment.  Follow up in 3 months.      Relevant Medications   FLUoxetine (PROZAC) 10 MG tablet    Other Visit Diagnoses    Screening for colon cancer    -  Primary   Relevant Orders   Cologuard  Follow up plan: Return in about 3 months  (around 10/11/2020) for 3 month f/u.

## 2020-07-11 NOTE — Assessment & Plan Note (Addendum)
Chronic, ongoing.  Continue baclofen for muscle spasms daily and meloxicam as needed for pain.  Refills sent in.  Continue with Physical Therapy.

## 2020-07-11 NOTE — Assessment & Plan Note (Signed)
Chronic, stable.  Good control on prozac 30 mg daily, continue this treatment.  Follow up in 3 months.

## 2020-07-11 NOTE — Assessment & Plan Note (Signed)
Acute, improving with PPI.  Continue for now, will trial discontinuance at next visit and if persistent, may consider GI referral with EGD.

## 2020-07-11 NOTE — Assessment & Plan Note (Signed)
Chronic, stable.  Appears to be well-controlled on HCTZ, continue this medication for now.  Labs checked today.  Follow up in 3 months.

## 2020-07-12 ENCOUNTER — Other Ambulatory Visit: Payer: Self-pay | Admitting: Family Medicine

## 2020-07-12 ENCOUNTER — Telehealth: Payer: Self-pay | Admitting: Family Medicine

## 2020-07-12 DIAGNOSIS — G801 Spastic diplegic cerebral palsy: Secondary | ICD-10-CM | POA: Diagnosis not present

## 2020-07-12 DIAGNOSIS — K219 Gastro-esophageal reflux disease without esophagitis: Secondary | ICD-10-CM | POA: Diagnosis not present

## 2020-07-12 DIAGNOSIS — F419 Anxiety disorder, unspecified: Secondary | ICD-10-CM | POA: Diagnosis not present

## 2020-07-12 DIAGNOSIS — E7800 Pure hypercholesterolemia, unspecified: Secondary | ICD-10-CM | POA: Diagnosis not present

## 2020-07-12 DIAGNOSIS — I10 Essential (primary) hypertension: Secondary | ICD-10-CM | POA: Diagnosis not present

## 2020-07-12 DIAGNOSIS — F339 Major depressive disorder, recurrent, unspecified: Secondary | ICD-10-CM | POA: Diagnosis not present

## 2020-07-12 LAB — COMPREHENSIVE METABOLIC PANEL
ALT: 11 IU/L (ref 0–32)
AST: 12 IU/L (ref 0–40)
Albumin/Globulin Ratio: 1.6 (ref 1.2–2.2)
Albumin: 4.3 g/dL (ref 3.8–4.8)
Alkaline Phosphatase: 110 IU/L (ref 48–121)
BUN/Creatinine Ratio: 14 (ref 12–28)
BUN: 10 mg/dL (ref 8–27)
Bilirubin Total: 0.2 mg/dL (ref 0.0–1.2)
CO2: 27 mmol/L (ref 20–29)
Calcium: 9.9 mg/dL (ref 8.7–10.3)
Chloride: 96 mmol/L (ref 96–106)
Creatinine, Ser: 0.69 mg/dL (ref 0.57–1.00)
GFR calc Af Amer: 108 mL/min/{1.73_m2} (ref >59)
GFR calc non Af Amer: 94 mL/min/{1.73_m2} (ref >59)
Globulin, Total: 2.7 g/dL (ref 1.5–4.5)
Glucose: 108 mg/dL — ABNORMAL HIGH (ref 65–99)
Potassium: 3.6 mmol/L (ref 3.5–5.2)
Sodium: 137 mmol/L (ref 134–144)
Total Protein: 7 g/dL (ref 6.0–8.5)

## 2020-07-12 LAB — CBC WITH DIFFERENTIAL/PLATELET
Basophils Absolute: 0 10*3/uL (ref 0.0–0.2)
Basos: 0 %
EOS (ABSOLUTE): 0.2 10*3/uL (ref 0.0–0.4)
Eos: 3 %
Hematocrit: 43.5 % (ref 34.0–46.6)
Hemoglobin: 14.5 g/dL (ref 11.1–15.9)
Immature Grans (Abs): 0 10*3/uL (ref 0.0–0.1)
Immature Granulocytes: 0 %
Lymphocytes Absolute: 1.7 10*3/uL (ref 0.7–3.1)
Lymphs: 25 %
MCH: 30.7 pg (ref 26.6–33.0)
MCHC: 33.3 g/dL (ref 31.5–35.7)
MCV: 92 fL (ref 79–97)
Monocytes Absolute: 0.5 10*3/uL (ref 0.1–0.9)
Monocytes: 7 %
Neutrophils Absolute: 4.4 10*3/uL (ref 1.4–7.0)
Neutrophils: 65 %
Platelets: 396 10*3/uL (ref 150–450)
RBC: 4.73 x10E6/uL (ref 3.77–5.28)
RDW: 12.5 % (ref 11.7–15.4)
WBC: 6.8 10*3/uL (ref 3.4–10.8)

## 2020-07-12 LAB — TSH: TSH: 1.96 u[IU]/mL (ref 0.450–4.500)

## 2020-07-12 LAB — LIPID PANEL W/O CHOL/HDL RATIO
Cholesterol, Total: 325 mg/dL — ABNORMAL HIGH (ref 100–199)
HDL: 44 mg/dL (ref >39)
LDL Chol Calc (NIH): 240 mg/dL — ABNORMAL HIGH (ref 0–99)
Triglycerides: 203 mg/dL — ABNORMAL HIGH (ref 0–149)
VLDL Cholesterol Cal: 41 mg/dL — ABNORMAL HIGH (ref 5–40)

## 2020-07-12 MED ORDER — ATORVASTATIN CALCIUM 40 MG PO TABS: 40.0000 mg | ORAL_TABLET | Freq: Every day | ORAL | 1 refills | Status: DC

## 2020-07-12 NOTE — Telephone Encounter (Signed)
Copied from [place] [id number]. Topic: General - Other >> Jul 12, 2020  3:37 PM Leward Quan A wrote: Reason for CRM: Patient would like a call back from [facility] Please Ph# [phone]

## 2020-07-15 DIAGNOSIS — G801 Spastic diplegic cerebral palsy: Secondary | ICD-10-CM | POA: Diagnosis not present

## 2020-07-15 DIAGNOSIS — F339 Major depressive disorder, recurrent, unspecified: Secondary | ICD-10-CM | POA: Diagnosis not present

## 2020-07-15 DIAGNOSIS — E7800 Pure hypercholesterolemia, unspecified: Secondary | ICD-10-CM | POA: Diagnosis not present

## 2020-07-15 DIAGNOSIS — F419 Anxiety disorder, unspecified: Secondary | ICD-10-CM | POA: Diagnosis not present

## 2020-07-15 DIAGNOSIS — K219 Gastro-esophageal reflux disease without esophagitis: Secondary | ICD-10-CM | POA: Diagnosis not present

## 2020-07-15 DIAGNOSIS — I10 Essential (primary) hypertension: Secondary | ICD-10-CM | POA: Diagnosis not present

## 2020-07-15 NOTE — Telephone Encounter (Signed)
Spoke to patient, see result note. 

## 2020-07-16 ENCOUNTER — Telehealth: Payer: Medicare Other

## 2020-07-17 DIAGNOSIS — F339 Major depressive disorder, recurrent, unspecified: Secondary | ICD-10-CM | POA: Diagnosis not present

## 2020-07-17 DIAGNOSIS — F419 Anxiety disorder, unspecified: Secondary | ICD-10-CM | POA: Diagnosis not present

## 2020-07-17 DIAGNOSIS — E7800 Pure hypercholesterolemia, unspecified: Secondary | ICD-10-CM | POA: Diagnosis not present

## 2020-07-17 DIAGNOSIS — K219 Gastro-esophageal reflux disease without esophagitis: Secondary | ICD-10-CM | POA: Diagnosis not present

## 2020-07-17 DIAGNOSIS — I10 Essential (primary) hypertension: Secondary | ICD-10-CM | POA: Diagnosis not present

## 2020-07-17 DIAGNOSIS — G801 Spastic diplegic cerebral palsy: Secondary | ICD-10-CM | POA: Diagnosis not present

## 2020-07-20 DIAGNOSIS — I10 Essential (primary) hypertension: Secondary | ICD-10-CM | POA: Diagnosis not present

## 2020-07-20 DIAGNOSIS — K219 Gastro-esophageal reflux disease without esophagitis: Secondary | ICD-10-CM | POA: Diagnosis not present

## 2020-07-20 DIAGNOSIS — I872 Venous insufficiency (chronic) (peripheral): Secondary | ICD-10-CM | POA: Diagnosis not present

## 2020-07-20 DIAGNOSIS — F17210 Nicotine dependence, cigarettes, uncomplicated: Secondary | ICD-10-CM | POA: Diagnosis not present

## 2020-07-20 DIAGNOSIS — J309 Allergic rhinitis, unspecified: Secondary | ICD-10-CM | POA: Diagnosis not present

## 2020-07-20 DIAGNOSIS — F419 Anxiety disorder, unspecified: Secondary | ICD-10-CM | POA: Diagnosis not present

## 2020-07-20 DIAGNOSIS — G801 Spastic diplegic cerebral palsy: Secondary | ICD-10-CM | POA: Diagnosis not present

## 2020-07-20 DIAGNOSIS — Z7982 Long term (current) use of aspirin: Secondary | ICD-10-CM | POA: Diagnosis not present

## 2020-07-20 DIAGNOSIS — G809 Cerebral palsy, unspecified: Secondary | ICD-10-CM | POA: Diagnosis not present

## 2020-07-20 DIAGNOSIS — E7800 Pure hypercholesterolemia, unspecified: Secondary | ICD-10-CM | POA: Diagnosis not present

## 2020-07-20 DIAGNOSIS — K5790 Diverticulosis of intestine, part unspecified, without perforation or abscess without bleeding: Secondary | ICD-10-CM | POA: Diagnosis not present

## 2020-07-20 DIAGNOSIS — F339 Major depressive disorder, recurrent, unspecified: Secondary | ICD-10-CM | POA: Diagnosis not present

## 2020-07-20 DIAGNOSIS — Z9181 History of falling: Secondary | ICD-10-CM | POA: Diagnosis not present

## 2020-07-23 DIAGNOSIS — I10 Essential (primary) hypertension: Secondary | ICD-10-CM | POA: Diagnosis not present

## 2020-07-23 DIAGNOSIS — G801 Spastic diplegic cerebral palsy: Secondary | ICD-10-CM | POA: Diagnosis not present

## 2020-07-23 DIAGNOSIS — K219 Gastro-esophageal reflux disease without esophagitis: Secondary | ICD-10-CM | POA: Diagnosis not present

## 2020-07-23 DIAGNOSIS — F339 Major depressive disorder, recurrent, unspecified: Secondary | ICD-10-CM | POA: Diagnosis not present

## 2020-07-23 DIAGNOSIS — E7800 Pure hypercholesterolemia, unspecified: Secondary | ICD-10-CM | POA: Diagnosis not present

## 2020-07-23 DIAGNOSIS — F419 Anxiety disorder, unspecified: Secondary | ICD-10-CM | POA: Diagnosis not present

## 2020-07-24 ENCOUNTER — Telehealth: Payer: Self-pay

## 2020-07-24 NOTE — Telephone Encounter (Signed)
Contacted patient for lung CT screening clinic after receiving referral from Carnella Guadal[place], NP.  I explained program to patient and she is not interested in participating in the program.  She expressed that she asked the NP to cancel the referral initially. I thanked her for her time.

## 2020-07-25 DIAGNOSIS — F339 Major depressive disorder, recurrent, unspecified: Secondary | ICD-10-CM | POA: Diagnosis not present

## 2020-07-25 DIAGNOSIS — K219 Gastro-esophageal reflux disease without esophagitis: Secondary | ICD-10-CM | POA: Diagnosis not present

## 2020-07-25 DIAGNOSIS — F419 Anxiety disorder, unspecified: Secondary | ICD-10-CM | POA: Diagnosis not present

## 2020-07-25 DIAGNOSIS — I10 Essential (primary) hypertension: Secondary | ICD-10-CM | POA: Diagnosis not present

## 2020-07-25 DIAGNOSIS — E7800 Pure hypercholesterolemia, unspecified: Secondary | ICD-10-CM | POA: Diagnosis not present

## 2020-07-25 DIAGNOSIS — G801 Spastic diplegic cerebral palsy: Secondary | ICD-10-CM | POA: Diagnosis not present

## 2020-07-26 ENCOUNTER — Ambulatory Visit (INDEPENDENT_AMBULATORY_CARE_PROVIDER_SITE_OTHER): Payer: Medicare Other

## 2020-07-26 VITALS — Ht 60.0 in | Wt 145.0 lb

## 2020-07-26 DIAGNOSIS — Z0000 Encounter for general adult medical examination without abnormal findings: Secondary | ICD-10-CM | POA: Diagnosis not present

## 2020-07-26 NOTE — Progress Notes (Signed)
I connected with [name] today by telephone and verified that I am speaking with the correct person using two identifiers. Location patient: home Location provider: work Persons participating in the virtual visit: Sheri Gatchel, Glenna Durand LPN.   I discussed the limitations, risks, security and privacy concerns of performing an evaluation and management service by telephone and the availability of in person appointments. I also discussed with the patient that there may be a patient responsible charge related to this service. The patient expressed understanding and verbally consented to this telephonic visit.    Interactive audio and video telecommunications were attempted between this provider and patient, however failed, due to patient having technical difficulties OR patient did not have access to video capability.  We continued and completed visit with audio only.     Vital signs may be patient reported or missing.  Subjective:   Michelle Chandler is a 64 y.o. female who presents for Medicare Annual (Subsequent) preventive examination.  Review of Systems     Cardiac Risk Factors include: dyslipidemia;hypertension;sedentary lifestyle     Objective:    Today's Vitals   07/26/20 1112  Weight: 145 lb (65.8 kg)  Height: 5' (1.524 m)   Body mass index is 28.32 kg/m.  Advanced Directives 07/26/2020 06/21/2019 09/23/2018 09/10/2018 01/28/2018 09/20/2016  Does Patient Have a Medical Advance Directive? No No No No Yes;No No  Does patient want to make changes to medical advance directive? - - - - Yes (MAU/Ambulatory/Procedural Areas - Information given) -  Would patient like information on creating a medical advance directive? - - - No - Patient declined - No - patient declined information    Current Medications (verified) Outpatient Encounter Medications as of 07/26/2020  Medication Sig  . aspirin EC 81 MG tablet Take 81 mg by mouth daily.  [name] atorvastatin (LIPITOR) 40 MG tablet Take 1  tablet (40 mg total) by mouth daily.  . baclofen (LIORESAL) 10 MG tablet Take 1 tablet (10 mg total) by mouth 2 (two) times daily.  [name] FLUoxetine (PROZAC) 10 MG tablet Take 3 tablets (30 mg total) by mouth daily.  . hydrochlorothiazide (HYDRODIURIL) 25 MG tablet Take 1 tablet (25 mg total) by mouth daily.  [name] ibuprofen (ADVIL) 400 MG tablet Take 1 tablet (400 mg total) by mouth 3 (three) times daily as needed.  . Incontinence Supply Disposable (ENTRUST PLUS DISP UNDERPADS) MISC by Does not apply route.  . Incontinence Supply Disposable (PROTECTIVE UNDERWEAR LARGE) MISC by Does not apply route.  [name] LORazepam (ATIVAN) 1 MG tablet TAKE ONE TABLET DAILY AS NEEDED FOR ANXIETY.  . meloxicam (MOBIC) 15 MG tablet Take 1 tablet (15 mg total) by mouth daily.  [name] omeprazole (PRILOSEC) 20 MG capsule Take 1 capsule (20 mg total) by mouth daily.   No facility-administered encounter medications on file as of 07/26/2020.    Allergies (verified) No known allergies   History: Past Medical History:  Diagnosis Date  . Advance care planning 06/28/2019  . Allergic rhinitis   . Ataxia   . Bursitis of shoulder 11/20/2015   Overview:  Last Assessment & Plan:  Discussed care and treatment shoulder bursitis with meloxicam  . CP (cerebral palsy) ([place])   . Depression   . Diverticulitis   . Diverticulosis   . Fatigue 02/05/2020  . Hyperlipidemia   . Hypertension   . IFG (impaired fasting glucose)   . Venous insufficiency   . Venous stasis ulcer ([place])   . Vertigo    Past Surgical History:  Procedure Laterality Date  . ABDOMINAL HYSTERECTOMY     complete  . LAPAROSCOPY    . legs and hip     due to CP   Family History  Problem Relation Age of Onset  . Diabetes Mother   . Diabetes Father   . Heart disease Maternal Uncle 46  . Kidney disease Maternal Uncle    Social History   Socioeconomic History  . Marital status: Single    Spouse name: Not on file  . Number of children: Not on file  . Years of  education: Not on file  . Highest education level: High school graduate  Occupational History  . Occupation: disability   Tobacco Use  . Smoking status: Current Every Day Smoker    Packs/day: 0.50    Types: Cigarettes  . Smokeless tobacco: Never Used  Vaping Use  . Vaping Use: Never used  Substance and Sexual Activity  . Alcohol use: No    Alcohol/week: 0.0 standard drinks  . Drug use: No  . Sexual activity: Never  Other Topics Concern  . Not on file  Social History Narrative  . Not on file   Social Determinants of Health   Financial Resource Strain: Low Risk   . Difficulty of Paying Living Expenses: Not hard at all  Food Insecurity: No Food Insecurity  . Worried About Charity fundraiser in the Last Year: Never true  . Ran Out of Food in the Last Year: Never true  Transportation Needs: No Transportation Needs  . Lack of Transportation (Medical): No  . Lack of Transportation (Non-Medical): No  Physical Activity: Insufficiently Active  . Days of Exercise per Week: 2 days  . Minutes of Exercise per Session: 60 min  Stress: No Stress Concern Present  . Feeling of Stress : Not at all  Social Connections:   . Frequency of Communication with Friends and Family: Not on file  . Frequency of Social Gatherings with Friends and Family: Not on file  . Attends Religious Services: Not on file  . Active Member of Clubs or Organizations: Not on file  . Attends Archivist Meetings: Not on file  . Marital Status: Not on file    Tobacco Counseling Ready to quit: No Counseling given: Not Answered   Clinical Intake:  Pre-visit preparation completed: Yes  Pain : No/denies pain     Nutritional Status: BMI 25 -29 Overweight Nutritional Risks: None Diabetes: No  How often do you need to have someone help you when you read instructions, pamphlets, or other written materials from your doctor or pharmacy?: 1 - Never What is the last grade level you completed in school?:  12th grade  Diabetic? no  Interpreter Needed?: No  Information entered by :: NAllen LPN   Activities of Daily Living In your present state of health, do you have any difficulty performing the following activities: 07/26/2020 02/05/2020  Hearing? N N  Vision? N N  Difficulty concentrating or making decisions? N Y  Comment - Sometimes  Walking or climbing stairs? Y Y  Dressing or bathing? Tempie Donning  Comment has an Engineer, production -  Doing errands, shopping? Tempie Donning  Comment has someone with her -  Conservation officer, nature and eating ? Y -  Comment has an Engineer, production -  Using the Toilet? N -  In the past six months, have you accidently leaked urine? Y -  Comment wears depends -  Do you have problems with loss of bowel control? N -  Managing your Medications? N -  Managing your Finances? N -  Housekeeping or managing your Housekeeping? Y -  Comment has aide -  Some recent data might be hidden    Patient Care Team: Guadalupe Maple, MD as PCP - General (Family Medicine) Vanita Ingles, RN as Case Manager ([place]) Greg Cutter, LCSW as Social Worker (Licensed Clinical Social Worker)  Indicate any recent [employer] you may have received from other than [facility] providers in the past year (date may be approximate).     Assessment:   This is a routine wellness examination for [place].  Hearing/Vision screen  Hearing Screening   '125Hz'  '250Hz'  '500Hz'  '1000Hz'  '2000Hz'  '3000Hz'  '4000Hz'  '6000Hz'  '8000Hz'   Right ear:           Left ear:           Vision Screening Comments: No regular eye exams  Dietary issues and exercise activities discussed: Current Exercise Habits: Home exercise routine (physical therapy), Type of exercise: walking, Time (Minutes): 60, Frequency (Times/Week): 2, Weekly Exercise (Minutes/Week): 120  Goals    .  Patient Stated      07/26/2020, no goals    .  PharmD "I want to keep my medications organized" (pt-stated)      CARE PLAN ENTRY (see longitudinal plan of care for additional care plan  information)  Current Barriers:  . Polypharmacy; complex patient with multiple comorbidities including HTN, cerebral palsy, depression, HLD . Notes today that she has been out of  simvastatin approximately one month. Reports pharmacy told her it was too early to fill. Increased to 40 mg in March for LDL 124. Per fill history available in computer 40 mg dose has not been filled so patient had been taking 2 tablets. Will ask provider to resend RX for 40 mg dose. Patient also states Pharmacy could not fill her 10 mg fluoxetine and she will be running out. Was to take 59m + 20 mg for a 350mdaily dose. Will coordinate new RX with provider. . Does not use a pill box. Notes she does not need.  . Most recent eGFR: 72 mL/min o Depression: fluoxetine 30 mg daily,  lorazepam 1 mg daily for anxiety, sleep o HTN: HCTZ 25 mg daily o Pain/muscle spasm: meloxicam 15 mg daily, ibuprofen 400 mg daily - reports that she uses these sparingly, yet requests refill for 90 day supplies on both. o ASCVD risk reduction: ASA 81 mg daily o HLD: simvastatin 40 mg daily ( ran out ~ 1 month ago had been taking 2  x 20 mg )  Pharmacist Clinical Goal(s):  . Marland Kitchenver the next 90 days, patient will work with PharmD and provider towards optimized medication management  Interventions: . Comprehensive medication review performed; medication list updated in electronic medical record . Inter-disciplinary care team collaboration (see longitudinal plan of care) .  Will collaborate w/ PCP to send fluxoetine 30 mg daily and simvastatin 40 mg daily 90 day supply to pharmacy. . Reviewed that patient has an upcoming appointment in August. She should discuss 90 day supplies w/ refills at that appointment  Patient Self Care Activities:  . Patient will take medications as prescribed  Initial goal documentation     .  Quit Smoking      Smoking cessation discussed- not ready to quit smoking     .  RNCM: "I eat regular food" (pt-stated)        Current Barriers:  . Chronic Disease Management support, education, and  care coordination needs related to HTN, HLD, Depression, and Cerebral Palsy  Clinical Goal(s) related to HTN, HLD, Depression, and Cerebral Palsy :  Over the next 120 days, patient will:  . Work with the care management team to address educational, disease management, and care coordination needs  . Begin or continue self health monitoring activities as directed today Measure and record blood pressure 4 times per week . Call provider office for new or worsened signs and symptoms Blood pressure findings outside established parameters and New or worsened symptom related to HLD, Depression or Cerebral Palsy . Call care management team with questions or concerns . Verbalize basic understanding of patient centered plan of care established today  Interventions related to HTN, HLD, Depression, and Cerebral Palsy :  . Evaluation of current treatment plans and patient's adherence to plan as established by provider. Saw provider recently and has a new appointment on 07-11-2020 for follow up . Assessed patient understanding of disease states.  The patient denies changes in her condition. Does not like to discuss health at length. Becomes more open with each new outreach. The patient is taking her blood pressure at home. Last reading was 133/67. 07-02-2020- The patient did not know her actual last blood pressure reading but says it was around 124/70 and her aide takes it regularly. The patient has been working with therapy and is getting stronger.  . Assessed patient's education and care coordination needs. Discussed CCM team pharmacist and LCSW. The patient is receptive to CCM team involvement. The patient is working with the LCSW and was given the pharmacist number today to help with medication assistance. Continues to work with the patient. Has an appointment tomorrow with the LCSW.  [name] Provided disease specific education to patient.  The  patient states she is trying to watch her salt intake. The patient is direct with responses to questions. Also discussed if the patient has decreased smoking and the patient has not at this time. Did not wish to discuss smoking cessation further.  . Assessed the patients activity level. The patient is not active and limited in her ability to exercise. She has an aide that comes into the home to help her with ADLS/IADLS.  Also working with PT.  [name] with appropriate clinical care team members regarding patient needs  Patient Self Care Activities related to HTN, HLD, Depression, and Cerebral Palsy :  . Patient is unable to independently self-manage chronic health conditions  Please see past updates related to this goal by clicking on the "Past Updates" button in the selected goal      .  RNCM: Depression and stress      CARE PLAN ENTRY (see longtitudinal plan of care for additional care plan information)  Current Barriers:  [name] Knowledge Deficits related to depression and stress and how to effectively manage . Lacks caregiver support.  . Cognitive Deficits  Nurse Case Manager Clinical Goal(s):  [name] Over the next 120 days, patient will verbalize understanding of plan for effectively managing depression and anxiety . Over the next 120 days, patient will work with pcp, RNCM and LCSW  to address needs related to how to have effective coping skills to deal with depression, stress, and anxiety . Over the next 120 days, patient will demonstrate a decrease in anxiety exacerbations as evidenced by following the plan of care directed by the pcp, taking medications as prescribed and working with resources to help with coping mechanisms.  . Over the next 120 days, patient will  attend all scheduled medical appointments: follow up with pcp after medication changes on 03-04-2020 . Over the next 120 days, patient will demonstrate improved adherence to prescribed treatment plan for depression, anxiety and  stress as evidenced byreturn to baseline status related to chronic depression . Over the next 120 days, patient will work with CM clinical social worker to establish rapport and work with patient on effective coping skills when she is feeling more depressed, stressed, or anxious  Interventions:  . Evaluation of current treatment plan related to depression, stress, and anxiety and patient's adherence to plan as established by provider. . Advised patient to discuss her concerns and changes with her pcp. The patient does not like to talk a lot with the [facility] about her depression.  RNCM working with the patient to establish rapport and help the patient feel at ease with discussing her conditions. 05-01-2020: the patient states she is managing her depression well at this time. Is interacting with the LCSW also. 07-02-2020: the patient states her depression is at a good place. She does want to get out more but is mindful of the current situation with COVID19.  Review of safety precautions.  The patient states they had a party for her moms birthday and she enjoyed that. States she was being safe. Each new outreach the patient is more talkative with the RNCM. Is appreciative of calls to check on her.  . Provided education to patient re: keeping appointments, available resources and CCM team here to help with any questions or concerns.  [name] Collaborated with pcp and LCSW regarding patients almost 'flat affect' in her voice and the best way to help the patient with her chronic conditions of depression and anxiety.  05-01-2020: the CCM team is actively working with the patient and establishing rapport with the patient. The patient seems to be more engaged with each new outreach.  . Discussed plans with patient for ongoing care management follow up and provided patient with direct contact information for care management team . Reviewed scheduled/upcoming provider appointments including: Appointment with pcp on  8-26--2021 . Social Work referral for support with patient with chronic depression and anxiety with effective coping skills. Actively working with the [employer]  Patient Self Care Activities:  . Attends all scheduled provider appointments . Calls provider office for new concerns or questions . Unable to independently manage depression and anxiety . Unable to perform ADLs independently . Unable to perform IADLs independently  Please see past updates related to this goal by clicking on the "Past Updates" button in the selected goal      .  SW: "My depression is getting better." (pt-stated)      Current Barriers:  . Chronic Mental Health needs related to depression and stress . Limited social support . Mental Health Concerns  . Social Isolation . Suicidal Ideation/Homicidal Ideation: No  Clinical Social Work Goal(s):  [name] Over the next 120 days, patient will work with SW  bi-monthly  by telephone or in person to reduce or manage symptoms related to depression . Over the next 120 days, patient will demonstrate improved health management independence as evidenced by implementing healthy self-care and positive support/resources into her daily routine to cope with stressors and improve overall health and well-being  . Over the next 120 days, patient will work with SW to address concerns related to gaining additional support/resource connection in order to maintain health . Over the next 120 days, patient will demonstrate improved adherence to self care as evidenced  by implementing healthy self-care into her daily routine such as: attending all medical appointments, deep breathing exercsies, taking time for self-reflection, taking medications as prescribed, drinking water and daily exercise to improve mobility.   Interventions: . Patient interviewed and appropriate assessments performed: PHQ 2 and PHQ 9 . Positive reinforcement provided for positive self-care implementation over the last 60 days.  Patient reports that she has intentionally made the effort to be outside to gain vitamin D. Patient also shares that she has increased her socialization. Patient reports having a strong support network from her friends, family and neighbors who visit her regularly.  . Provided patient with information about available mental health support resources and socialization opportunities for seniors within her nearby area.  . Discussed plans with patient for ongoing care management follow up and provided patient with direct contact information for care management team . LCSW was able to successfully build rapport with patient. [name] LCSW discussed coping skills for anxiety. SW used empathetic and active and reflective listening, validated patient's feelings/concerns, and provided emotional support. LCSW provided self-care education to help manage her multiple health conditions and improve her mood.  [name] with [facility] Pharmacist re: New referral placed on 07/03/20. Patient is having issues with keeping up with refilling her medications. She is wondering if Pharmacy can assist her with adjusting her medications to 90 day refills instead of 30 day. She reports that some of her medications have no refills. She reports ongoing stress with medication management and is wondering if there is an easier way to manage all of her medications.  . Assisted patient/caregiver with obtaining information about health plan benefits . Referred patient to [facility] and [facility] for long term follow up and therapy/counseling. Patient reports that her recent increase in her antidepressant is effectively working for her. Patient denied needing further mental health support at this time and declined the need for therapy.  . Solution-Focused Strategies implemented during session today.  Patient Self Care Activities:  . Calls provider office for new concerns or questions . Ability for insight . Independent  living . Motivation for treatment  Patient Coping Strengths:  . Supportive Relationships . Hopefulness . Able to Communicate Effectively  Patient Self Care Deficits:  . Unable to independently keep up with medication refills . Lacks social connections  Initial goal documentation       Depression Screen PHQ 2/9 Scores 07/26/2020 07/11/2020 06/10/2020 04/17/2020 02/05/2020 12/29/2019 10/19/2019  PHQ - 2 Score 0 '5 1 1 ' 0 1 1  PHQ- 9 Score 0 '16 5 3 5 2 5    ' Fall Risk Fall Risk  07/26/2020 02/05/2020 06/21/2019 09/22/2018 08/16/2018  Falls in the past year? 0 0 1 1 No  Number falls in past yr: - - 1 0 -  Injury with Fall? - - 0 1 -  Risk Factor Category  - - - - -  Risk for fall due to : Impaired balance/gait;Impaired mobility;Medication side effect History of fall(s);Impaired balance/gait;Impaired mobility - History of fall(s);Impaired balance/gait -  Follow up Falls evaluation completed;Education provided;Falls prevention discussed Falls evaluation completed - - -    Any stairs in or around the home? Yes  If so, are there any without handrails? No  Home free of loose throw rugs in walkways, pet beds, electrical cords, etc? Yes  Adequate lighting in your home to reduce risk of falls? Yes   ASSISTIVE DEVICES UTILIZED TO PREVENT FALLS:  Life alert? Yes  Use of a cane, walker or  w/c? Yes  Grab bars in the bathroom? Yes  Shower chair or bench in shower? Yes  Elevated toilet seat or a handicapped toilet? Yes   TIMED UP AND GO:  Was the test performed? No . .    Cognitive Function:     6CIT Screen 07/26/2020 01/28/2018  What Year? 4 points 0 points  What month? 0 points 0 points  What time? 0 points 0 points  Count back from 20 0 points 0 points  Months in reverse 0 points 0 points  Repeat phrase 6 points 4 points  Total Score 10 4    Immunizations Immunization History  Administered Date(s) Administered  . Influenza,inj,Quad PF,6+ Mos 10/19/2017, 08/16/2018  .  Influenza-Unspecified 11/14/2014, 09/14/2015, 10/05/2016  . PFIZER SARS-COV-2 Vaccination 05/03/2020, 05/22/2020  . Td 08/30/2007, 10/19/2017    TDAP status: Up to date Flu Vaccine status: Up to date Pneumococcal vaccine status: n/a Covid-19 vaccine status: Completed vaccines  Qualifies for Shingles Vaccine? Yes   Zostavax completed No   Shingrix Completed?: No.    Education has been provided regarding the importance of this vaccine. Patient has been advised to call insurance company to determine out of pocket expense if they have not yet received this vaccine. Advised may also receive vaccine at local pharmacy or Health Dept. Verbalized acceptance and understanding.  Screening Tests Health Maintenance  Topic Date Due  . COLONOSCOPY  Never done  . INFLUENZA VACCINE  06/16/2020  . MAMMOGRAM  04/05/2022  . TETANUS/TDAP  10/20/2027  . COVID-19 Vaccine  Completed  . Hepatitis C Screening  Completed  . HIV Screening  Completed    Health Maintenance  Health Maintenance Due  Topic Date Due  . COLONOSCOPY  Never done  . INFLUENZA VACCINE  06/16/2020    Colorectal cancer screening: needs cologuard Mammogram status: Completed 04/05/2020. Repeat every year Bone Density status: n/a  Lung Cancer Screening: (Low Dose CT Chest recommended if Age 44-80 years, 30 pack-year currently smoking OR have quit w/in 15years.) does not qualify.   Lung Cancer Screening Referral: no  Additional Screening:  Hepatitis C Screening: does qualify; Completed 11/20/2015  Vision Screening: Recommended annual ophthalmology exams for early detection of glaucoma and other disorders of the eye. Is the patient up to date with their annual eye exam?  No  Who is the provider or what is the name of the office in which the patient attends annual eye exams? none If pt is not established with a provider, would they like to be referred to a provider to establish care? No .   Dental Screening: Recommended annual  dental exams for proper oral hygiene  Community Resource Referral / Chronic Care Management: CRR required this visit?  No   CCM required this visit?  No      Plan:     I have personally reviewed and noted the following in the patient's chart:   . Medical and social history . Use of alcohol, tobacco or illicit drugs  . Current medications and supplements . Functional ability and status . Nutritional status . Physical activity . Advanced directives . List of other physicians . Hospitalizations, surgeries, and ER visits in previous 12 months . Vitals . Screenings to include cognitive, depression, and falls . Referrals and appointments  In addition, I have reviewed and discussed with patient certain preventive protocols, quality metrics, and best practice recommendations. A written personalized care plan for preventive services as well as general preventive health recommendations were provided to patient.  Kellie Simmering, LPN   5/00/3704   Nurse Notes:

## 2020-07-26 NOTE — Patient Instructions (Signed)
Ms. Hunton , Thank you for taking time to come for your Medicare Wellness Visit. I appreciate your ongoing commitment to your health goals. Please review the following plan we discussed and let me know if I can assist you in the future.   Screening recommendations/referrals: Colonoscopy: needs new cologuard kit Mammogram: completed 04/05/2020 Bone Density: n/a Recommended yearly ophthalmology/optometry visit for glaucoma screening and checkup Recommended yearly dental visit for hygiene and checkup  Vaccinations: Influenza vaccine: due Pneumococcal vaccine: n/a Tdap vaccine: completed 10/19/2017 Shingles vaccine: discussed  Covid-19:  05/22/2020, 05/03/2020  Advanced directives: Advance directive discussed with you today.    Conditions/risks identified: smoking  Next appointment: Follow up in one year for your annual wellness visit.   Preventive Care 40-64 Years, Female Preventive care refers to lifestyle choices and visits with your health care provider that can promote health and wellness. What does preventive care include?  A yearly physical exam. This is also called an annual well check.  Dental exams once or twice a year.  Routine eye exams. Ask your health care provider how often you should have your eyes checked.  Personal lifestyle choices, including:  Daily care of your teeth and gums.  Regular physical activity.  Eating a healthy diet.  Avoiding tobacco and drug use.  Limiting alcohol use.  Practicing safe sex.  Taking low-dose aspirin daily starting at age 35.  Taking vitamin and mineral supplements as recommended by your health care provider. What happens during an annual well check? The services and screenings done by your health care provider during your annual well check will depend on your age, overall health, lifestyle risk factors, and family history of disease. Counseling  Your health care provider may ask you questions about your:  Alcohol  use.  Tobacco use.  Drug use.  Emotional well-being.  Home and relationship well-being.  Sexual activity.  Eating habits.  Work and work Statistician.  Method of birth control.  Menstrual cycle.  Pregnancy history. Screening  You may have the following tests or measurements:  Height, weight, and BMI.  Blood pressure.  Lipid and cholesterol levels. These may be checked every 5 years, or more frequently if you are over 57 years old.  Skin check.  Lung cancer screening. You may have this screening every year starting at age 82 if you have a 30-pack-year history of smoking and currently smoke or have quit within the past 15 years.  Fecal occult blood test (FOBT) of the stool. You may have this test every year starting at age 50.  Flexible sigmoidoscopy or colonoscopy. You may have a sigmoidoscopy every 5 years or a colonoscopy every 10 years starting at age 37.  Hepatitis C blood test.  Hepatitis B blood test.  Sexually transmitted disease (STD) testing.  Diabetes screening. This is done by checking your blood sugar (glucose) after you have not eaten for a while (fasting). You may have this done every 1-3 years.  Mammogram. This may be done every 1-2 years. Talk to your health care provider about when you should start having regular mammograms. This may depend on whether you have a family history of breast cancer.  BRCA-related cancer screening. This may be done if you have a family history of breast, ovarian, tubal, or peritoneal cancers.  Pelvic exam and Pap test. This may be done every 3 years starting at age 75. Starting at age 52, this may be done every 5 years if you have a Pap test in combination with an HPV  test.  Bone density scan. This is done to screen for osteoporosis. You may have this scan if you are at high risk for osteoporosis. Discuss your test results, treatment options, and if necessary, the need for more tests with your health care  provider. Vaccines  Your health care provider may recommend certain vaccines, such as:  Influenza vaccine. This is recommended every year.  Tetanus, diphtheria, and acellular pertussis (Tdap, Td) vaccine. You may need a Td booster every 10 years.  Zoster vaccine. You may need this after age 85.  Pneumococcal 13-valent conjugate (PCV13) vaccine. You may need this if you have certain conditions and were not previously vaccinated.  Pneumococcal polysaccharide (PPSV23) vaccine. You may need one or two doses if you smoke cigarettes or if you have certain conditions. Talk to your health care provider about which screenings and vaccines you need and how often you need them. This information is not intended to replace advice given to you by your health care provider. Make sure you discuss any questions you have with your health care provider. Document Released: 11/29/2015 Document Revised: 07/22/2016 Document Reviewed: 09/03/2015 Elsevier Interactive Patient Education  2017 [employer] Prevention in the Home Falls can cause injuries. They can happen to people of all ages. There are many things you can do to make your home safe and to help prevent falls. What can I do on the outside of my home?  Regularly fix the edges of walkways and driveways and fix any cracks.  Remove anything that might make you trip as you walk through a door, such as a raised step or threshold.  Trim any bushes or trees on the path to your home.  Use bright outdoor lighting.  Clear any walking paths of anything that might make someone trip, such as rocks or tools.  Regularly check to see if handrails are loose or broken. Make sure that both sides of any steps have handrails.  Any raised decks and porches should have guardrails on the edges.  Have any leaves, snow, or ice cleared regularly.  Use sand or salt on walking paths during winter.  Clean up any spills in your garage right away. This includes  oil or grease spills. What can I do in the bathroom?  Use night lights.  Install grab bars by the toilet and in the tub and shower. Do not use towel bars as grab bars.  Use non-skid mats or decals in the tub or shower.  If you need to sit down in the shower, use a plastic, non-slip stool.  Keep the floor dry. Clean up any water that spills on the floor as soon as it happens.  Remove soap buildup in the tub or shower regularly.  Attach bath mats securely with double-sided non-slip rug tape.  Do not have throw rugs and other things on the floor that can make you trip. What can I do in the bedroom?  Use night lights.  Make sure that you have a light by your bed that is easy to reach.  Do not use any sheets or blankets that are too big for your bed. They should not hang down onto the floor.  Have a firm chair that has side arms. You can use this for support while you get dressed.  Do not have throw rugs and other things on the floor that can make you trip. What can I do in the kitchen?  Clean up any spills right away.  Avoid  walking on wet floors.  Keep items that you use a lot in easy-to-reach places.  If you need to reach something above you, use a strong step stool that has a grab bar.  Keep electrical cords out of the way.  Do not use floor polish or wax that makes floors slippery. If you must use wax, use non-skid floor wax.  Do not have throw rugs and other things on the floor that can make you trip. What can I do with my stairs?  Do not leave any items on the stairs.  Make sure that there are handrails on both sides of the stairs and use them. Fix handrails that are broken or loose. Make sure that handrails are as long as the stairways.  Check any carpeting to make sure that it is firmly attached to the stairs. Fix any carpet that is loose or worn.  Avoid having throw rugs at the top or bottom of the stairs. If you do have throw rugs, attach them to the floor  with carpet tape.  Make sure that you have a light switch at the top of the stairs and the bottom of the stairs. If you do not have them, ask someone to add them for you. What else can I do to help prevent falls?  Wear shoes that:  Do not have high heels.  Have rubber bottoms.  Are comfortable and fit you well.  Are closed at the toe. Do not wear sandals.  If you use a stepladder:  Make sure that it is fully opened. Do not climb a closed stepladder.  Make sure that both sides of the stepladder are locked into place.  Ask someone to hold it for you, if possible.  Clearly mark and make sure that you can see:  Any grab bars or handrails.  First and last steps.  Where the edge of each step is.  Use tools that help you move around (mobility aids) if they are needed. These include:  Canes.  Walkers.  Scooters.  Crutches.  Turn on the lights when you go into a dark area. Replace any light bulbs as soon as they burn out.  Set up your furniture so you have a clear path. Avoid moving your furniture around.  If any of your floors are uneven, fix them.  If there are any pets around you, be aware of where they are.  Review your medicines with your doctor. Some medicines can make you feel dizzy. This can increase your chance of falling. Ask your doctor what other things that you can do to help prevent falls. This information is not intended to replace advice given to you by your health care provider. Make sure you discuss any questions you have with your health care provider. Document Released: 08/29/2009 Document Revised: 04/09/2016 Document Reviewed: 12/07/2014 Elsevier Interactive Patient Education  2017 [employer].

## 2020-07-29 DIAGNOSIS — G801 Spastic diplegic cerebral palsy: Secondary | ICD-10-CM | POA: Diagnosis not present

## 2020-07-29 DIAGNOSIS — F339 Major depressive disorder, recurrent, unspecified: Secondary | ICD-10-CM | POA: Diagnosis not present

## 2020-07-29 DIAGNOSIS — K219 Gastro-esophageal reflux disease without esophagitis: Secondary | ICD-10-CM | POA: Diagnosis not present

## 2020-07-29 DIAGNOSIS — I10 Essential (primary) hypertension: Secondary | ICD-10-CM | POA: Diagnosis not present

## 2020-07-29 DIAGNOSIS — F419 Anxiety disorder, unspecified: Secondary | ICD-10-CM | POA: Diagnosis not present

## 2020-07-29 DIAGNOSIS — E7800 Pure hypercholesterolemia, unspecified: Secondary | ICD-10-CM | POA: Diagnosis not present

## 2020-07-31 DIAGNOSIS — E7800 Pure hypercholesterolemia, unspecified: Secondary | ICD-10-CM | POA: Diagnosis not present

## 2020-07-31 DIAGNOSIS — K219 Gastro-esophageal reflux disease without esophagitis: Secondary | ICD-10-CM | POA: Diagnosis not present

## 2020-07-31 DIAGNOSIS — I10 Essential (primary) hypertension: Secondary | ICD-10-CM | POA: Diagnosis not present

## 2020-07-31 DIAGNOSIS — F339 Major depressive disorder, recurrent, unspecified: Secondary | ICD-10-CM | POA: Diagnosis not present

## 2020-07-31 DIAGNOSIS — F419 Anxiety disorder, unspecified: Secondary | ICD-10-CM | POA: Diagnosis not present

## 2020-07-31 DIAGNOSIS — G801 Spastic diplegic cerebral palsy: Secondary | ICD-10-CM | POA: Diagnosis not present

## 2020-08-05 ENCOUNTER — Telehealth: Payer: Medicare Other

## 2020-08-05 DIAGNOSIS — G801 Spastic diplegic cerebral palsy: Secondary | ICD-10-CM | POA: Diagnosis not present

## 2020-08-05 DIAGNOSIS — F339 Major depressive disorder, recurrent, unspecified: Secondary | ICD-10-CM | POA: Diagnosis not present

## 2020-08-05 DIAGNOSIS — I10 Essential (primary) hypertension: Secondary | ICD-10-CM | POA: Diagnosis not present

## 2020-08-05 DIAGNOSIS — E7800 Pure hypercholesterolemia, unspecified: Secondary | ICD-10-CM | POA: Diagnosis not present

## 2020-08-05 DIAGNOSIS — F419 Anxiety disorder, unspecified: Secondary | ICD-10-CM | POA: Diagnosis not present

## 2020-08-05 DIAGNOSIS — K219 Gastro-esophageal reflux disease without esophagitis: Secondary | ICD-10-CM | POA: Diagnosis not present

## 2020-08-06 ENCOUNTER — Telehealth: Payer: Self-pay | Admitting: Pharmacist

## 2020-08-06 NOTE — Progress Notes (Signed)
Patients appointment was rescheduled from 08/05/2020 to September 06, 2020 at 11:30am.  Georgiana Shore ,[place] Pharmacist Assistant [phone]

## 2020-09-03 ENCOUNTER — Ambulatory Visit (INDEPENDENT_AMBULATORY_CARE_PROVIDER_SITE_OTHER): Payer: Medicare Other | Admitting: General Practice

## 2020-09-03 ENCOUNTER — Telehealth: Payer: Medicare Other | Admitting: General Practice

## 2020-09-03 DIAGNOSIS — F419 Anxiety disorder, unspecified: Secondary | ICD-10-CM

## 2020-09-03 DIAGNOSIS — F339 Major depressive disorder, recurrent, unspecified: Secondary | ICD-10-CM | POA: Diagnosis not present

## 2020-09-03 DIAGNOSIS — G801 Spastic diplegic cerebral palsy: Secondary | ICD-10-CM

## 2020-09-03 DIAGNOSIS — E7800 Pure hypercholesterolemia, unspecified: Secondary | ICD-10-CM

## 2020-09-03 DIAGNOSIS — I10 Essential (primary) hypertension: Secondary | ICD-10-CM

## 2020-09-03 DIAGNOSIS — K219 Gastro-esophageal reflux disease without esophagitis: Secondary | ICD-10-CM

## 2020-09-03 NOTE — Chronic Care Management (AMB) (Signed)
Chronic Care Management   Follow Up Note   09/03/2020 Name: Michelle Chandler MRN: [id number] DOB: January 24, 1956  Referred by: Michelle Maple, MD Reason for referral : Chronic Care Management (RNCM Chronic Disease Management and Care Coordination Needs)   Michelle Chandler is a 64 y.o. year old female who is a primary care patient of Crissman, Michelle How, MD. The CCM team was consulted for assistance with chronic disease management and care coordination needs.    Review of patient status, including review of consultants reports, relevant laboratory and other test results, and collaboration with appropriate care team members and the patient's provider was performed as part of comprehensive patient evaluation and provision of chronic care management services.    SDOH (Social Determinants of Health) assessments performed: Yes See Care Plan activities for detailed interventions related to [facility])     Outpatient Encounter Medications as of 09/03/2020  Medication Sig  . aspirin EC 81 MG tablet Take 81 mg by mouth daily.  [name] atorvastatin (LIPITOR) 40 MG tablet Take 1 tablet (40 mg total) by mouth daily.  . baclofen (LIORESAL) 10 MG tablet Take 1 tablet (10 mg total) by mouth 2 (two) times daily.  [name] FLUoxetine (PROZAC) 10 MG tablet Take 3 tablets (30 mg total) by mouth daily.  . hydrochlorothiazide (HYDRODIURIL) 25 MG tablet Take 1 tablet (25 mg total) by mouth daily.  [name] ibuprofen (ADVIL) 400 MG tablet Take 1 tablet (400 mg total) by mouth 3 (three) times daily as needed.  . Incontinence Supply Disposable (ENTRUST PLUS DISP UNDERPADS) MISC by Does not apply route.  . Incontinence Supply Disposable (PROTECTIVE UNDERWEAR LARGE) MISC by Does not apply route.  [name] LORazepam (ATIVAN) 1 MG tablet TAKE ONE TABLET DAILY AS NEEDED FOR ANXIETY.  . meloxicam (MOBIC) 15 MG tablet Take 1 tablet (15 mg total) by mouth daily.  [name] omeprazole (PRILOSEC) 20 MG capsule Take 1 capsule (20 mg total) by mouth daily.   No  facility-administered encounter medications on file as of 09/03/2020.     Objective:  BP Readings from Last 3 Encounters:  07/11/20 130/79  06/10/20 (!) 130/82  02/05/20 112/74    Goals Addressed              This Visit's Progress   .  RNCM: "I eat regular food" (pt-stated)        Current Barriers:  . Chronic Disease Management support, education, and care coordination needs related to HTN, HLD, Depression, and Cerebral Palsy  Clinical Goal(s) related to HTN, HLD, Depression, and Cerebral Palsy :  Over the next 120 days, patient will:  . Work with the care management team to address educational, disease management, and care coordination needs  . Begin or continue self health monitoring activities as directed today Measure and record blood pressure 4 times per week . Call provider office for new or worsened signs and symptoms Blood pressure findings outside established parameters and New or worsened symptom related to HLD, Depression or Cerebral Palsy . Call care management team with questions or concerns . Verbalize basic understanding of patient centered plan of care established today  Interventions related to HTN, HLD, Depression, and Cerebral Palsy :  . Evaluation of current treatment plans and patient's adherence to plan as established by provider. 09-03-2020: the patient is doing well and is following the regimen of the provider.  . Assessed patient understanding of disease states.  The patient denies changes in her condition. Does not like to discuss health at length. Becomes more  open with each new outreach. The patient is taking her blood pressure at home. Last reading was 133/67. 07-02-2020- The patient did not know her actual last blood pressure reading but says it was around 124/70 and her aide takes it regularly. The patient has been working with therapy and is getting stronger. 09-03-2020: the patient is doing well and denies any new concerns. States her blood pressure is  stable. She is eating better and she had her flu shot on 08-26-2020. Record updated.  . Assessed patient's education and care coordination needs. Discussed CCM team pharmacist and LCSW. The patient is receptive to CCM team involvement. The patient is working with the LCSW and was given the pharmacist number today to help with medication assistance. Continues to work with the patient. Has an appointment tomorrow with the LCSW.  [name] Provided disease specific education to patient.  The patient states she is trying to watch her salt intake. The patient is direct with responses to questions. Also discussed if the patient has decreased smoking and the patient has not at this time. Did not wish to discuss smoking cessation further. 09-03-2020: the patient states her appetite is better and she is eating better. Denies any new concerns related to dietary restrictions.  . Assessed the patients activity level. The patient is not active and limited in her ability to exercise. She has an aide that comes into the home to help her with ADLS/IADLS.  Also working with PT.  [name] with appropriate clinical care team members regarding patient needs . Assessed for upcoming appointments. The patient has an appointment on 10-04-2020 to see the pcp.   Patient Self Care Activities related to HTN, HLD, Depression, and Cerebral Palsy :  . Patient is unable to independently self-manage chronic health conditions  Please see past updates related to this goal by clicking on the "Past Updates" button in the selected goal          Plan:   Telephone follow up appointment with care management team member scheduled for: 11-05-2020 at 1 pm   Noreene Larsson RN, MSN, [facility] Mobile: [phone]

## 2020-09-03 NOTE — Patient Instructions (Signed)
Visit Information  Goals Addressed              This Visit's Progress   .  RNCM: "I eat regular food" (pt-stated)        Current Barriers:  . Chronic Disease Management support, education, and care coordination needs related to HTN, HLD, Depression, and Cerebral Palsy  Clinical Goal(s) related to HTN, HLD, Depression, and Cerebral Palsy :  Over the next 120 days, patient will:  . Work with the care management team to address educational, disease management, and care coordination needs  . Begin or continue self health monitoring activities as directed today Measure and record blood pressure 4 times per week . Call provider office for new or worsened signs and symptoms Blood pressure findings outside established parameters and New or worsened symptom related to HLD, Depression or Cerebral Palsy . Call care management team with questions or concerns . Verbalize basic understanding of patient centered plan of care established today  Interventions related to HTN, HLD, Depression, and Cerebral Palsy :  . Evaluation of current treatment plans and patient's adherence to plan as established by provider. 09-03-2020: the patient is doing well and is following the regimen of the provider.  . Assessed patient understanding of disease states.  The patient denies changes in her condition. Does not like to discuss health at length. Becomes more open with each new outreach. The patient is taking her blood pressure at home. Last reading was 133/67. 07-02-2020- The patient did not know her actual last blood pressure reading but says it was around 124/70 and her aide takes it regularly. The patient has been working with therapy and is getting stronger. 09-03-2020: the patient is doing well and denies any new concerns. States her blood pressure is stable. She is eating better and she had her flu shot on 08-26-2020. Record updated.  . Assessed patient's education and care coordination needs. Discussed CCM team  pharmacist and LCSW. The patient is receptive to CCM team involvement. The patient is working with the LCSW and was given the pharmacist number today to help with medication assistance. Continues to work with the patient. Has an appointment tomorrow with the LCSW.  [name] Provided disease specific education to patient.  The patient states she is trying to watch her salt intake. The patient is direct with responses to questions. Also discussed if the patient has decreased smoking and the patient has not at this time. Did not wish to discuss smoking cessation further. 09-03-2020: the patient states her appetite is better and she is eating better. Denies any new concerns related to dietary restrictions.  . Assessed the patients activity level. The patient is not active and limited in her ability to exercise. She has an aide that comes into the home to help her with ADLS/IADLS.  Also working with PT.  [name] with appropriate clinical care team members regarding patient needs . Assessed for upcoming appointments. The patient has an appointment on 10-04-2020 to see the pcp.   Patient Self Care Activities related to HTN, HLD, Depression, and Cerebral Palsy :  . Patient is unable to independently self-manage chronic health conditions  Please see past updates related to this goal by clicking on the "Past Updates" button in the selected goal         Patient verbalizes understanding of instructions provided today.   Telephone follow up appointment with care management team member scheduled for: 11-05-2020 at 1 pm  [place], MSN, CCM [facility]  [facility] Mobile: [phone]

## 2020-09-06 ENCOUNTER — Ambulatory Visit: Payer: Medicare Other | Admitting: Pharmacist

## 2020-09-06 DIAGNOSIS — E785 Hyperlipidemia, unspecified: Secondary | ICD-10-CM

## 2020-09-06 DIAGNOSIS — I10 Essential (primary) hypertension: Secondary | ICD-10-CM

## 2020-09-06 NOTE — Progress Notes (Signed)
Chronic Care Management Pharmacy  Name: Michelle Chandler  MRN: [id number] DOB: October 23, 1956   Chief Complaint/ HPI  Michelle Chandler,  64 y.o. , female presents for their Follow-Up CCM visit with the clinical pharmacist via telephone.  PCP : Guadalupe Maple, MD Patient Care Team: Guadalupe Maple, MD as PCP - General (Family Medicine) Vanita Ingles, RN as Case Manager ([place]) Greg Cutter, LCSW as Social Worker (Licensed Clinical Social Worker)  Their chronic conditions include: Hypertension, Hyperlipidemia, GERD, Depression, Tobacco use and Cerebral Palsy   Office Visits: 07/12/20 - [name], PA change to atorvastatin 40 mg, LDL 240, cologuard ordered  Consult Visit: 07/24/20-[facility] cancer screening--patient declined lung screening   Allergies  Allergen Reactions  . No Known Allergies     Medications: Outpatient Encounter Medications as of 09/06/2020  Medication Sig Note  . aspirin EC 81 MG tablet Take 81 mg by mouth daily. Patient taking  differently,,,,, taking every other day 09/06/2020: taking every other day  . atorvastatin (LIPITOR) 40 MG tablet Take 1 tablet (40 mg total) by mouth daily.   . baclofen (LIORESAL) 10 MG tablet Take 1 tablet (10 mg total) by mouth 2 (two) times daily.   [name] FLUoxetine (PROZAC) 10 MG tablet Take 3 tablets (30 mg total) by mouth daily.   . hydrochlorothiazide (HYDRODIURIL) 25 MG tablet Take 1 tablet (25 mg total) by mouth daily.   . Incontinence Supply Disposable (ENTRUST PLUS DISP UNDERPADS) MISC by Does not apply route.   . Incontinence Supply Disposable (PROTECTIVE UNDERWEAR LARGE) MISC by Does not apply route.   . meloxicam (MOBIC) 15 MG tablet Take 1 tablet (15 mg total) by mouth daily.   [name] omeprazole (PRILOSEC) 20 MG capsule Take 1 capsule (20 mg total) by mouth daily.   [name] ibuprofen (ADVIL) 400 MG tablet Take 1 tablet (400 mg total) by mouth 3 (three) times daily as needed. (Patient not taking: Reported on 09/06/2020)   .  LORazepam (ATIVAN) 1 MG tablet TAKE ONE TABLET DAILY AS NEEDED FOR ANXIETY.    No facility-administered encounter medications on file as of 09/06/2020.    Wt Readings from Last 3 Encounters:  07/26/20 145 lb (65.8 kg)  07/11/20 146 lb (66.2 kg)  06/10/20 145 lb (65.8 kg)    Current Diagnosis/Assessment:    Goals Addressed            This Visit's Progress   . Pharmacy Care Plan       CARE PLAN ENTRY (see longitudinal plan of care for additional care plan information)  Current Barriers:  . Chronic Disease Management support, education, and care coordination needs related to Hypertension, Hyperlipidemia, GERD, Depression, Tobacco use, and Cerebral Palsy   Hypertension BP Readings from Last 3 Encounters:  07/11/20 130/79  06/10/20 (!) 130/82  02/05/20 112/74   . Pharmacist Clinical Goal(s): o Over the next 60  days, patient will work with PharmD and providers to achieve BP goal <130/80 . Current regimen:  o HCTZ 25 mg daily . Interventions: o Reviewed proper BP technique o Discussed diet and sodium content of food o Encouraged smoking cessation . Patient self care activities - Over the next 60 days, patient will: o Check BP daily, document, and provide at future appointments o Ensure daily salt intake < 2300 mg/day o Consider smoking cessation  Hyperlipidemia Lab Results  Component Value Date/Time   LDLCALC 240 (H) 07/11/2020 12:03 PM   . Pharmacist Clinical Goal(s): o Over the next 60 days,  patient will work with PharmD and providers to achieve LDL goal < 100 . Current regimen:  . Atorvastatin 40 mg qd . Aspirin 81 mg qd . Interventions: o Provided diet  counseling. o Confirmed patient has replaced simvastatin with atorvastatin and is not taking both o Reviewed goal lipid values o Counseled on increased risk of GI bleed with aspirin and meloxicam o Counseled on benefit of smoking cessation. . Patient self care activities - Over the next 60 days, patient  will: o Attend all scheduled appointments o Refill prescriptions regularly o Focus on heart healthy diet o Consider smoking cessation  Medication management . Pharmacist Clinical Goal(s): o Over the next 60 days, patient will work with PharmD and providers to achieve optimal medication adherence . Current pharmacy: [facility] . Interventions o Comprehensive medication review performed. o Continue current medication management strategy . Patient self care activities - Over the next 60 days, patient will: o Focus on medication adherence by fill dates o Take medications as prescribed o Report any questions or concerns to PharmD and/or provider(s)  Initial goal documentation        Hyperlipidemia   LDL goal < 100  Lipid Panel     Component Value Date/Time   CHOL 325 (H) 07/11/2020 1203   CHOL 231 (H) 08/16/2018 1049   TRIG 203 (H) 07/11/2020 1203   TRIG 266 (H) 08/16/2018 1049   HDL 44 07/11/2020 1203   LDLCALC 240 (H) 07/11/2020 1203    Hepatic Function Latest Ref Rng & Units 07/11/2020 02/05/2020 08/16/2018  Total Protein 6.0 - 8.5 g/dL 7.0 7.0 -  Albumin 3.8 - 4.8 g/dL 4.3 4.2 -  AST 0 - 40 IU/L '12 8 20  ' ALT 0 - 32 IU/L '11 8 21  ' Alk Phosphatase 48 - 121 IU/L 110 111 -  Total Bilirubin 0.0 - 1.2 mg/dL 0.2 0.2 -     The ASCVD Risk score Mikey Bussing DC Jr., et al., 2013) failed to calculate for the following reasons:   The systolic blood pressure is missing   The valid total cholesterol range is 130 to 320 mg/dL   Patient has failed these meds in past: Simvastatin Patient is currently uncontrolled on the following medications:  . Atorvastatin 40 mg qd . Aspirin 81 mg qd  We discussed:  Patient has replaced simvastatin with atorvastatin as prescribed by [name], PA. She is tolerating well. Denies any missed doses. Reviewed increased cholesterol levels from last labs in March.Patient reports missing ~ one month of simvastatin between lab values. Patient aware of  follow-up appointment in November. Discussed meloxicam  And aspirin use increases risk of GI Bleed. Patient denies any signs/symptoms of bleeding.  Plan  Continue current medications. Recommend follow-up lipid panel at next visit. Consider discontinuing aspirin for primary prevention given daily NSAID use.  Hypertension   BP goal is:  <130/80  Office blood pressures are  BP Readings from Last 3 Encounters:  07/11/20 130/79  06/10/20 (!) 130/82  02/05/20 112/74   Patient checks BP at home Aide checks regularly. Patient home BP readings are ranging: She can only recall one reading of ~170/70 but states other reading are "not high"  Patient has failed these meds in the past: NA Patient is currently query  controlled on the following medications:  . HCTZ 25 mg qd   We discussed diet and exercise extensively and smoking cessation. Patient states she has not really cut back on her cigarettes. She states her BP is "good".  Provided heart healthy dietary counseling including low-sodium.  Plan  Continue current medications       Medication Management   Pt uses [place] for all medications Uses pill box? No - does not feel she needs Pt endorses 99% compliance  We discussed: Patient is satisfied with pharmacy services. States "everything is fine and I have all my medicines".   Plan  Continue current medication management strategy    Follow up: 2 month phone visit  Junita Push. Kenton Kingfisher PharmD, [facility] [phone]

## 2020-09-18 ENCOUNTER — Telehealth: Payer: Self-pay

## 2020-09-18 NOTE — Patient Instructions (Addendum)
Visit Information  It was a pleasure speaking with you today. Thank you for letting me be part of your clinical team. Please call with any questions or concerns.   Goals Addressed            This Visit's Progress   . Pharmacy Care Plan       CARE PLAN ENTRY (see longitudinal plan of care for additional care plan information)  Current Barriers:  . Chronic Disease Management support, education, and care coordination needs related to Hypertension, Hyperlipidemia, GERD, Depression, Tobacco use, and Cerebral Palsy   Hypertension BP Readings from Last 3 Encounters:  07/11/20 130/79  06/10/20 (!) 130/82  02/05/20 112/74   . Pharmacist Clinical Goal(s): o Over the next 60  days, patient will work with PharmD and providers to achieve BP goal <130/80 . Current regimen:  o HCTZ 25 mg daily . Interventions: o Reviewed proper BP technique o Discussed diet and sodium content of food o Encouraged smoking cessation . Patient self care activities - Over the next 60 days, patient will: o Check BP daily, document, and provide at future appointments o Ensure daily salt intake < 2300 mg/day o Consider smoking cessation  Hyperlipidemia Lab Results  Component Value Date/Time   LDLCALC 240 (H) 07/11/2020 12:03 PM   . Pharmacist Clinical Goal(s): o Over the next 60 days, patient will work with PharmD and providers to achieve LDL goal < 100 . Current regimen:  . Atorvastatin 40 mg qd . Aspirin 81 mg qd . Interventions: o Provided diet  counseling. o Confirmed patient has replaced simvastatin with atorvastatin and is not taking both o Reviewed goal lipid values o Counseled on increased risk of GI bleed with aspirin and meloxicam o Counseled on benefit of smoking cessation. . Patient self care activities - Over the next 60 days, patient will: o Attend all scheduled appointments o Refill prescriptions regularly o Focus on heart healthy diet o Consider smoking cessation  Medication  management . Pharmacist Clinical Goal(s): o Over the next 60 days, patient will work with PharmD and providers to achieve optimal medication adherence . Current pharmacy: [facility] . Interventions o Comprehensive medication review performed. o Continue current medication management strategy . Patient self care activities - Over the next 60 days, patient will: o Focus on medication adherence by fill dates o Take medications as prescribed o Report any questions or concerns to PharmD and/or provider(s)  Initial goal documentation        The patient verbalized understanding of instructions provided today and agreed to receive a mailed copy of patient instruction and/or educational materials.  Telephone follow up appointment with pharmacy team member scheduled for: 2 months  Junita Push. Kenton Kingfisher PharmD, BCPS Clinical Pharmacist [phone]  Health Risks of Smoking Smoking cigarettes is very bad for your health. Tobacco smoke has over 200 known poisons in it. It contains the poisonous gases nitrogen oxide and carbon monoxide. There are over 60 chemicals in tobacco smoke that cause cancer. Smoking is difficult to quit because a chemical in tobacco, called nicotine, causes addiction or dependence. When you smoke and inhale, nicotine is absorbed rapidly into the bloodstream through your lungs. Both inhaled and non-inhaled nicotine may be addictive. What are the risks of cigarette smoke? Cigarette smokers have an increased risk of many serious medical problems, including:  Lung cancer.  Lung disease, such as pneumonia, bronchitis, and emphysema.  Chest pain (angina) and heart attack because the heart is not getting enough oxygen.  Heart  disease and peripheral blood vessel disease.  High blood pressure (hypertension).  Stroke.  Oral cancer, including cancer of the lip, mouth, or voice box.  Bladder cancer.  Pancreatic cancer.  Cervical cancer.  Pregnancy complications,  including premature birth.  Stillbirths and smaller newborn babies, birth defects, and genetic damage to sperm.  Early menopause.  Lower estrogen level for women.  Infertility.  Facial wrinkles.  Blindness.  Increased risk of broken bones (fractures).  Senile dementia.  Stomach ulcers and internal bleeding.  Delayed wound healing and increased risk of complications during surgery.  Even smoking lightly shortens your life expectancy by several years. Because of secondhand smoke exposure, children of smokers have an increased risk of the following:  Sudden infant death syndrome (SIDS).  Respiratory infections.  Lung cancer.  Heart disease.  Ear infections. What are the benefits of quitting? There are many health benefits of quitting smoking. Here are some of them:  Within days of quitting smoking, your risk of having a heart attack decreases, your blood flow improves, and your lung capacity improves. Blood pressure, pulse rate, and breathing patterns start returning to normal soon after quitting.  Within months, your lungs may clear up completely.  Quitting for 10 years reduces your risk of developing lung cancer and heart disease to almost that of a nonsmoker.  People who quit may see an improvement in their overall quality of life. How do I quit smoking?     Smoking is an addiction with both physical and psychological effects, and longtime habits can be hard to change. Your health care provider can recommend:  Programs and community resources, which may include group support, education, or talk therapy.  Prescription medicines to help reduce cravings.  Nicotine replacement products, such as patches, gum, and nasal sprays. Use these products only as directed. Do not replace cigarette smoking with electronic cigarettes, which are commonly called e-cigarettes. The safety of e-cigarettes is not known, and some may contain harmful chemicals.  A combination of two or  more of these methods. Where to find more information  American Lung Association: www.lung.org  American Cancer Society: www.cancer.org Summary  Smoking cigarettes is very bad for your health. Cigarette smokers have an increased risk of many serious medical problems, including several cancers, heart disease, and stroke.  Smoking is an addiction with both physical and psychological effects, and longtime habits can be hard to change.  By stopping right away, you can greatly reduce the risk of medical problems for you and your family.  To help you quit smoking, your health care provider can recommend programs, community resources, prescription medicines, and nicotine replacement products such as patches, gum, and nasal sprays. This information is not intended to replace advice given to you by your health care provider. Make sure you discuss any questions you have with your health care provider. Document Revised: 02/03/2018 Document Reviewed: 11/06/2016 Elsevier Patient Education  2020 [employer].

## 2020-09-25 ENCOUNTER — Telehealth: Payer: Self-pay

## 2020-10-04 ENCOUNTER — Ambulatory Visit (INDEPENDENT_AMBULATORY_CARE_PROVIDER_SITE_OTHER): Payer: Medicare Other | Admitting: Nurse Practitioner

## 2020-10-04 ENCOUNTER — Encounter: Payer: Self-pay | Admitting: Nurse Practitioner

## 2020-10-04 ENCOUNTER — Other Ambulatory Visit: Payer: Self-pay

## 2020-10-04 VITALS — BP 129/78 | HR 74 | Temp 98.5°F | Wt 142.4 lb

## 2020-10-04 DIAGNOSIS — F17210 Nicotine dependence, cigarettes, uncomplicated: Secondary | ICD-10-CM

## 2020-10-04 DIAGNOSIS — I10 Essential (primary) hypertension: Secondary | ICD-10-CM

## 2020-10-04 DIAGNOSIS — R7301 Impaired fasting glucose: Secondary | ICD-10-CM | POA: Diagnosis not present

## 2020-10-04 DIAGNOSIS — K219 Gastro-esophageal reflux disease without esophagitis: Secondary | ICD-10-CM

## 2020-10-04 DIAGNOSIS — G801 Spastic diplegic cerebral palsy: Secondary | ICD-10-CM | POA: Diagnosis not present

## 2020-10-04 DIAGNOSIS — F339 Major depressive disorder, recurrent, unspecified: Secondary | ICD-10-CM

## 2020-10-04 DIAGNOSIS — E7800 Pure hypercholesterolemia, unspecified: Secondary | ICD-10-CM

## 2020-10-04 MED ORDER — FLUOXETINE HCL 10 MG PO CAPS: 10.0000 mg | ORAL_CAPSULE | Freq: Every day | ORAL | 1 refills | Status: DC

## 2020-10-04 MED ORDER — IBUPROFEN 400 MG PO TABS: 400.0000 mg | ORAL_TABLET | Freq: Three times a day (TID) | ORAL | 0 refills | Status: DC | PRN

## 2020-10-04 MED ORDER — LORAZEPAM 1 MG PO TABS: ORAL_TABLET | ORAL | 0 refills | Status: DC

## 2020-10-04 MED ORDER — OMEPRAZOLE 20 MG PO CPDR: 20.0000 mg | DELAYED_RELEASE_CAPSULE | Freq: Every day | ORAL | 1 refills | Status: DC

## 2020-10-04 MED ORDER — FLUOXETINE HCL 20 MG PO CAPS: 20.0000 mg | ORAL_CAPSULE | Freq: Every day | ORAL | 1 refills | Status: DC

## 2020-10-04 NOTE — Assessment & Plan Note (Signed)
Patient is a current smoker, is not interested in quitting at this time.  Patient encouraged to try to cut back and to notify provider if interested in cessation.

## 2020-10-04 NOTE — Assessment & Plan Note (Signed)
Chronic, stable.  PHQ-9 slightly elevated today, however patient has been out of 20 mg Prozac tablets for some time.  No SI/HI today.  Also uses lorazepam less than once weekly.  We will refill these today and follow-up in 3 months or sooner if needs arise.

## 2020-10-04 NOTE — Progress Notes (Signed)
BP 129/78    Pulse 74    Temp 98.5 F (36.9 C) (Oral)    Wt 142 lb 6.4 oz (64.6 kg)    SpO2 97%    BMI 27.81 kg/m    Subjective:    Patient ID: Michelle Chandler, female    DOB: Dec 20, 1955, 64 y.o.   MRN: [id number]  HPI: [name] is a 64 y.o. female presenting for chronic disease follow up.  Chief Complaint  Patient presents with   Depression   Hyperlipidemia   Hypertension   DEPRESSION Has been out of 20 mg Prozac tablets for couple weeks. Mood status: uncontrolled Satisfied with current treatment?: no Symptom severity: moderate  Duration of current treatment : chronic Side effects: no Medication compliance: excellent compliance Psychotherapy/counseling: no Previous psychiatric medications: Prozac, lorazepam Depressed mood: yes Anxious mood: no Anhedonia: yes Significant weight loss or gain: no Insomnia: no Fatigue: yes Feelings of worthlessness or guilt: no Impaired concentration/indecisiveness: no Suicidal ideations: no Hopelessness: no Crying spells: no Depression screen [facility] 2/9 10/04/2020 07/26/2020 07/11/2020 06/10/2020 04/17/2020  Decreased Interest 1 0 2 0 0  Down, Depressed, Hopeless 1 0 3 1 1   PHQ - 2 Score 2 0 5 1 1   Altered sleeping 0 0 1 1 1   Tired, decreased energy 1 0 2 1 0  Change in appetite 1 0 2 0 0  Feeling bad or failure about yourself  1 0 2 1 0  Trouble concentrating 1 0 2 1 1   Moving slowly or fidgety/restless 1 0 2 0 0  Suicidal thoughts 0 0 0 0 0  PHQ-9 Score 7 0 16 5 3   Difficult doing work/chores Somewhat difficult Not difficult at all Not difficult at all Somewhat difficult Somewhat difficult  Some recent data might be hidden   HYPERTENSION / HYPERLIPIDEMIA Satisfied with current treatment? yes Duration of hypertension: chronic BP monitoring frequency: daily BP range: 120s/70s BP medication side effects: no Past BP meds: hctz 25 mg Duration of hyperlipidemia: chronic Cholesterol medication side effects: no Cholesterol  supplements: none Past cholesterol medications: pravastatin, now on atorvastatin Aspirin: no Recent stressors: no Recurrent headaches: no Visual changes: no Palpitations: no Dyspnea: no Chest pain: no Lower extremity edema: no Dizzy/lightheaded: no  Allergies  Allergen Reactions   No Known Allergies    Outpatient Encounter Medications as of 10/04/2020  Medication Sig Note   aspirin EC 81 MG tablet Take 81 mg by mouth daily. Patient taking  differently,,,,, taking every other day 09/06/2020: taking every other day   atorvastatin (LIPITOR) 40 MG tablet Take 1 tablet (40 mg total) by mouth daily.    baclofen (LIORESAL) 10 MG tablet Take 1 tablet (10 mg total) by mouth 2 (two) times daily.    FLUoxetine (PROZAC) 10 MG capsule Take 1 capsule (10 mg total) by mouth daily.    FLUoxetine (PROZAC) 20 MG capsule Take 1 capsule (20 mg total) by mouth daily.    hydrochlorothiazide (HYDRODIURIL) 25 MG tablet Take 1 tablet (25 mg total) by mouth daily.    Incontinence Supply Disposable (ENTRUST PLUS DISP UNDERPADS) MISC by Does not apply route.    Incontinence Supply Disposable (PROTECTIVE UNDERWEAR LARGE) MISC by Does not apply route.    LORazepam (ATIVAN) 1 MG tablet TAKE ONE TABLET DAILY AS NEEDED FOR ANXIETY.    meloxicam (MOBIC) 15 MG tablet Take 1 tablet (15 mg total) by mouth daily.    omeprazole (PRILOSEC) 20 MG capsule Take 1 capsule (20 mg total) by mouth daily.    [  DISCONTINUED] FLUoxetine (PROZAC) 10 MG capsule Take 10 mg by mouth daily.    [DISCONTINUED] FLUoxetine (PROZAC) 10 MG tablet Take 3 tablets (30 mg total) by mouth daily.    [DISCONTINUED] FLUoxetine (PROZAC) 20 MG capsule Take 20 mg by mouth daily.    [DISCONTINUED] LORazepam (ATIVAN) 1 MG tablet TAKE ONE TABLET DAILY AS NEEDED FOR ANXIETY.    [DISCONTINUED] omeprazole (PRILOSEC) 20 MG capsule Take 1 capsule (20 mg total) by mouth daily.    ibuprofen (ADVIL) 400 MG tablet Take 1 tablet (400 mg total) by  mouth 3 (three) times daily as needed.    [DISCONTINUED] ibuprofen (ADVIL) 400 MG tablet Take 1 tablet (400 mg total) by mouth 3 (three) times daily as needed. (Patient not taking: Reported on 09/06/2020)    No facility-administered encounter medications on file as of 10/04/2020.   Patient Active Problem List   Diagnosis Date Noted   Gastroesophageal reflux disease 06/10/2020   Smoking greater than 40 pack years 06/10/2020   Cerebral palsy ([place]) 12/27/2015   Hyperlipidemia 12/27/2015   Essential (primary) hypertension 12/27/2015   Depression, recurrent ([place]) 11/20/2015   Past Medical History:  Diagnosis Date   Advance care planning 06/28/2019   Allergic rhinitis    Ataxia    Bursitis of shoulder 11/20/2015   Overview:  Last Assessment & Plan:  Discussed care and treatment shoulder bursitis with meloxicam   CP (cerebral palsy) ([place])    Depression    Diverticulitis    Diverticulosis    Fatigue 02/05/2020   Hyperlipidemia    Hypertension    IFG (impaired fasting glucose)    Venous insufficiency    Venous stasis ulcer ([place])    Vertigo    Relevant past medical, surgical, family and social history reviewed and updated as indicated. Interim medical history since our last visit reviewed. Allergies and medications reviewed and updated.  Review of Systems  Constitutional: Negative.  Negative for activity change, appetite change, fatigue and fever.  Eyes: Negative.  Negative for visual disturbance.  Respiratory: Negative.  Negative for cough, chest tightness, shortness of breath and wheezing.   Cardiovascular: Negative.  Negative for chest pain, palpitations and leg swelling.  Gastrointestinal: Negative.  Negative for abdominal pain, blood in stool, constipation, diarrhea, nausea and vomiting.  Endocrine: Negative.   Musculoskeletal: Positive for arthralgias (shoulder).  Skin: Negative.  Negative for color change and rash.  Neurological: Negative.  Negative for  dizziness, weakness, light-headedness and headaches.  Psychiatric/Behavioral: Negative.  Negative for confusion, hallucinations, sleep disturbance and suicidal ideas. The patient is not nervous/anxious.    Per HPI unless specifically indicated above     Objective:    BP 129/78    Pulse 74    Temp 98.5 F (36.9 C) (Oral)    Wt 142 lb 6.4 oz (64.6 kg)    SpO2 97%    BMI 27.81 kg/m   Wt Readings from Last 3 Encounters:  10/04/20 142 lb 6.4 oz (64.6 kg)  07/26/20 145 lb (65.8 kg)  07/11/20 146 lb (66.2 kg)    Physical Exam Vitals and nursing note reviewed.  Constitutional:      General: She is not in acute distress.    Appearance: Normal appearance. She is obese. She is not toxic-appearing.  HENT:     Head: Normocephalic and atraumatic.     Right Ear: External ear normal.     Left Ear: External ear normal.  Eyes:     General: No scleral icterus.  Right eye: No discharge.        Left eye: No discharge.     Extraocular Movements: Extraocular movements intact.  Neck:     Vascular: No carotid bruit.  Cardiovascular:     Rate and Rhythm: Normal rate and regular rhythm.     Heart sounds: Normal heart sounds. No murmur heard.   Pulmonary:     Effort: Pulmonary effort is normal. No respiratory distress.     Breath sounds: Normal breath sounds. No wheezing, rhonchi or rales.  Abdominal:     General: Abdomen is flat. Bowel sounds are normal.     Palpations: Abdomen is soft.     Tenderness: There is no abdominal tenderness.  Musculoskeletal:        General: No swelling or tenderness. Normal range of motion.     Cervical back: Normal range of motion.  Skin:    General: Skin is warm and dry.     Coloration: Skin is not jaundiced or pale.  Neurological:     General: No focal deficit present.     Mental Status: She is alert and oriented to person, place, and time.     Motor: No weakness.  Psychiatric:        Mood and Affect: Mood normal.        Behavior: Behavior normal.          Thought Content: Thought content normal.        Judgment: Judgment normal.        Assessment & Plan:   Problem List Items Addressed This Visit      Cardiovascular and Mediastinum   Essential (primary) hypertension    Chronic, stable.  Continue hydrochlorothiazide 25 mg daily.  Follow-up in 6 months.        Digestive   Gastroesophageal reflux disease    Chronic, stable.  Notices that when she does not take PPI, symptoms return.  We will continue PPI for now and be sure to follow magnesium in future.  Refill sent in for omeprazole.  With any persistent GERD that does not respond to daily omeprazole, will place referral to GI for EGD.  Patient is aware of this plan and is in agreement.  Follow-up in 6 months.      Relevant Medications   omeprazole (PRILOSEC) 20 MG capsule     Nervous and Auditory   Cerebral palsy (HCC)    Chronic, stable.  We will continue baclofen for muscle spasms daily and meloxicam as needed for pain.  Patient aware to take meloxicam seldom, currently takes less than once weekly.  Also uses ibuprofen less than once weekly to help with pain control.  Refill sent in.  If deconditioning returns, may reconsider home health physical therapy.      Relevant Medications   ibuprofen (ADVIL) 400 MG tablet     Other   Hyperlipidemia    Chronic, ongoing.  Tolerating switch from simvastatin to atorvastatin well.  Will check lipids today and may need to adjust medication based on results.  Follow-up in 6 months or sooner if needs arise.      Relevant Orders   Lipid Panel w/o Chol/HDL Ratio   Depression, recurrent (HCC) - Primary    Chronic, stable.  PHQ-9 slightly elevated today, however patient has been out of 20 mg Prozac tablets for some time.  No SI/HI today.  Also uses lorazepam less than once weekly.  We will refill these today and follow-up in 3 months or sooner if  needs arise.      Relevant Medications   FLUoxetine (PROZAC) 10 MG capsule   FLUoxetine  (PROZAC) 20 MG capsule   LORazepam (ATIVAN) 1 MG tablet   Smoking greater than 40 pack years    Patient is a current smoker, is not interested in quitting at this time.  Patient encouraged to try to cut back and to notify provider if interested in cessation.       Other Visit Diagnoses    Elevated fasting glucose       Relevant Orders   HgB A1c       Follow up plan: Return in about 3 months (around 01/04/2021) for HTN, HLD, mood, cerebral palsy f/u.

## 2020-10-04 NOTE — Assessment & Plan Note (Signed)
Chronic, stable.  We will continue baclofen for muscle spasms daily and meloxicam as needed for pain.  Patient aware to take meloxicam seldom, currently takes less than once weekly.  Also uses ibuprofen less than once weekly to help with pain control.  Refill sent in.  If deconditioning returns, may reconsider home health physical therapy.

## 2020-10-04 NOTE — Assessment & Plan Note (Signed)
Chronic, ongoing.  Tolerating switch from simvastatin to atorvastatin well.  Will check lipids today and may need to adjust medication based on results.  Follow-up in 6 months or sooner if needs arise.

## 2020-10-04 NOTE — Assessment & Plan Note (Addendum)
Chronic, stable.  Continue hydrochlorothiazide 25 mg daily.  Follow-up in 6 months.

## 2020-10-04 NOTE — Assessment & Plan Note (Signed)
Chronic, stable.  Notices that when she does not take PPI, symptoms return.  We will continue PPI for now and be sure to follow magnesium in future.  Refill sent in for omeprazole.  With any persistent GERD that does not respond to daily omeprazole, will place referral to GI for EGD.  Patient is aware of this plan and is in agreement.  Follow-up in 6 months.

## 2020-10-05 LAB — HEMOGLOBIN A1C
Est. average glucose Bld gHb Est-mCnc: 134 mg/dL
Hgb A1c MFr Bld: 6.3 % — ABNORMAL HIGH (ref 4.8–5.6)

## 2020-10-05 LAB — LIPID PANEL W/O CHOL/HDL RATIO
Cholesterol, Total: 170 mg/dL (ref 100–199)
HDL: 48 mg/dL (ref >39)
LDL Chol Calc (NIH): 102 mg/dL — ABNORMAL HIGH (ref 0–99)
Triglycerides: 110 mg/dL (ref 0–149)
VLDL Cholesterol Cal: 20 mg/dL (ref 5–40)

## 2020-10-07 ENCOUNTER — Encounter: Payer: Self-pay | Admitting: Nurse Practitioner

## 2020-10-07 ENCOUNTER — Ambulatory Visit: Payer: Medicare Other | Admitting: Licensed Clinical Social Worker

## 2020-10-07 DIAGNOSIS — R7303 Prediabetes: Secondary | ICD-10-CM

## 2020-10-07 DIAGNOSIS — F339 Major depressive disorder, recurrent, unspecified: Secondary | ICD-10-CM

## 2020-10-07 DIAGNOSIS — G801 Spastic diplegic cerebral palsy: Secondary | ICD-10-CM

## 2020-10-07 DIAGNOSIS — I10 Essential (primary) hypertension: Secondary | ICD-10-CM

## 2020-10-07 DIAGNOSIS — E7800 Pure hypercholesterolemia, unspecified: Secondary | ICD-10-CM

## 2020-10-07 HISTORY — DX: Prediabetes: R73.03

## 2020-10-07 NOTE — Chronic Care Management (AMB) (Signed)
Chronic Care Management    Clinical Social Work Follow Up Note  10/07/2020 Name: Michelle Chandler MRN: [id number] DOB: [birth year]  Michelle Chandler is a 64 y.o. year old female who is a primary care patient of Crissman, Jeannette How, MD. The CCM team was consulted for assistance with Mental Health Counseling and Resources.   Review of patient status, including review of consultants reports, other relevant assessments, and collaboration with appropriate care team members and the patient's provider was performed as part of comprehensive patient evaluation and provision of chronic care management services.    SDOH (Social Determinants of Health) assessments performed: Yes    Outpatient Encounter Medications as of 10/07/2020  Medication Sig Note   aspirin EC 81 MG tablet Take 81 mg by mouth daily. Patient taking  differently,,,,, taking every other day 09/06/2020: taking every other day   atorvastatin (LIPITOR) 40 MG tablet Take 1 tablet (40 mg total) by mouth daily.    baclofen (LIORESAL) 10 MG tablet Take 1 tablet (10 mg total) by mouth 2 (two) times daily.    FLUoxetine (PROZAC) 10 MG capsule Take 1 capsule (10 mg total) by mouth daily.    FLUoxetine (PROZAC) 20 MG capsule Take 1 capsule (20 mg total) by mouth daily.    hydrochlorothiazide (HYDRODIURIL) 25 MG tablet Take 1 tablet (25 mg total) by mouth daily.    ibuprofen (ADVIL) 400 MG tablet Take 1 tablet (400 mg total) by mouth 3 (three) times daily as needed.    Incontinence Supply Disposable (ENTRUST PLUS DISP UNDERPADS) MISC by Does not apply route.    Incontinence Supply Disposable (PROTECTIVE UNDERWEAR LARGE) MISC by Does not apply route.    LORazepam (ATIVAN) 1 MG tablet TAKE ONE TABLET DAILY AS NEEDED FOR ANXIETY.    meloxicam (MOBIC) 15 MG tablet Take 1 tablet (15 mg total) by mouth daily.    omeprazole (PRILOSEC) 20 MG capsule Take 1 capsule (20 mg total) by mouth daily.    No facility-administered encounter medications on file as  of 10/07/2020.     Patient Care Plan: General Social Work (Adult)    Problem Identified: Anxiety Identification (Anxiety)     Goal: Depressive Symptoms Identified   Note:   Evidence-based guidance:  Identify risk for depression by reviewing presenting symptoms and risk factors.  Review use of medications that contribute to depression such as steroid, narcotic, sedative, antihypertensive, beta blocker, cytoxic agent.  Review related metabolic processes, including infection, anemia, thyroid dysfunction, kidney failure, heart failure, alcohol or substance use.  Perform depression screening using standardized tools to obtain baseline intensity of depressive symptoms.  Perform or refer for a full diagnostic interview when positive screening results are noted; use DSM-5 criteria to determine appropriate diagnosis (e.g., major depression, persistent depressive disorder, unspecified depressive   disorder).   Notes:   Task: Identify Depressive Symptoms and Facilitate Treatment   Note:   Care Management Activities:    {Identify Depressive Symptoms and Facilitate Treatment (Clinical) (Depression)   Notes:   Long-Range Goal: Anxiety Symptoms Identified   Start Date: 10/07/2020  This Visit's Progress: On track  Note:   Evidence-based guidance:  Assess for presence of additional co-occurring psychiatric comorbidity [e.g., substance use, other anxiety disorder (specific phobia, social anxiety disorder, panic disorder, agoraphobia, substance or medically-induced   anxiety disorder)].  Assess for presence of medical comorbidity (e.g., chronic pain, chronic illness), recent or recurrent trauma or abuse, family history of substance use disorder or mental illness.  Screen for anxiety using standardized, validated  tool.  Move gradually from investigating somatic complaints to exploring social or psychologic distress.  Assess for signs and symptoms of anxiety in an atmosphere of hope and optimism.    Facilitate full diagnostic interview when positive screening results are noted; utilize DSM-5 criteria to determine appropriate diagnosis.  Screen for depression simultaneously due to the frequency of co-occurrence.   Notes:    Current Barriers:   Chronic Mental Health needs related to depression and stress  Limited social support  Mental Health Concerns   Social Isolation  Suicidal Ideation/Homicidal Ideation: No  Clinical Social Work Goal(s):   Over the next 120 days, patient will work with SW bi-monthly by telephone or in person to reduce or manage symptoms related to depression  Over the next 120 days, patient will demonstrate improved health management independence as evidenced by implementing healthy self-care and positive support/resources into her daily routine to cope with stressors and improve overall health and well-being   Over the next 120 days, patient will work with SW to address concerns related to gaining additional support/resource connection in order to maintain health  Over the next 120 days, patient will demonstrate improved adherence to self care as evidenced by implementing healthy self-care into her daily routine such as: attending all medical appointments, deep breathing exercsies, taking time for self-reflection, taking medications as prescribed, drinking water and daily exercise to improve mobility.   Interventions:  Patient interviewed and appropriate assessments performed  Positive reinforcement provided for positive self-care implementation over the last 60 days. Patient reports that she has intentionally made the effort to be outside to gain vitamin D. Patient also shares that she is in need of increasing her socialization. Patient reports having a strong support network from her friends, family and neighbors who visit her regularly. She shares that she goes outside daily to get fresh air.   Patient reports that she has not had to take her Lorazepam  in over one month.  Provided patient with information about available mental health support resources and socialization opportunities for seniors within her nearby area.   Discussed plans with patient for ongoing care management follow up and provided patient with direct contact information for care management team  LCSW was able to successfully build rapport with patient.  LCSW discussed coping skills for anxiety. SW used empathetic and active and reflective listening, validated patient's feelings/concerns, and provided emotional support. LCSW provided self-care education to help manage her multiple health conditions and improve her mood.   Past pharmacy referral placed on 07/03/20. Patient is having issues with keeping up with refilling her medications. She is wondering if Pharmacy can assist her with adjusting her medications to 90 day refills instead of 30 day. She reports that some of her medications have no refills. She reports ongoing stress with medication management and is wondering if there is an easier way to manage all of her medications.   Assisted patient/caregiver with obtaining information about health plan benefits  Referred patient to [facility] and [facility] for long term follow up and therapy/counseling but patient declined. She reports that she rather have close friends that she uses for feedback and emotional support instead of going to a therapist.  Patient reports that her recent increase in her antidepressant is effectively working for her. Patient denied needing further mental health support again on 10/07/20  Solution-Focused Strategies implemented during session today.  Patient Self Care Activities:   Calls provider office for new concerns or questions  Ability for insight  Independent living  Motivation for treatment  Patient Coping Strengths:   Supportive Relationships  Hopefulness  Able to Communicate Effectively  Patient Self  Care Deficits:   Unable to independently keep up with medication refills  Lacks social connections     Follow Up Plan: SW will follow up with patient by phone over the next quarter  Eula Fried, [place], [email or web address], [email or web address] Phone: [phone]

## 2020-10-08 DIAGNOSIS — Z23 Encounter for immunization: Secondary | ICD-10-CM | POA: Diagnosis not present

## 2020-11-05 ENCOUNTER — Ambulatory Visit: Payer: Self-pay | Admitting: General Practice

## 2020-11-05 ENCOUNTER — Telehealth: Payer: Medicare Other | Admitting: General Practice

## 2020-11-05 DIAGNOSIS — E7800 Pure hypercholesterolemia, unspecified: Secondary | ICD-10-CM

## 2020-11-05 DIAGNOSIS — F339 Major depressive disorder, recurrent, unspecified: Secondary | ICD-10-CM

## 2020-11-05 DIAGNOSIS — F17210 Nicotine dependence, cigarettes, uncomplicated: Secondary | ICD-10-CM

## 2020-11-05 DIAGNOSIS — I10 Essential (primary) hypertension: Secondary | ICD-10-CM

## 2020-11-05 DIAGNOSIS — G801 Spastic diplegic cerebral palsy: Secondary | ICD-10-CM

## 2020-11-05 DIAGNOSIS — F419 Anxiety disorder, unspecified: Secondary | ICD-10-CM

## 2020-11-05 NOTE — Patient Instructions (Signed)
Visit Information  Goals Addressed              This Visit's Progress     RNCM: "I eat regular food" (pt-stated)        Current Barriers:   Chronic Disease Management support, education, and care coordination needs related to HTN, HLD, Depression, and Cerebral Palsy  Clinical Goal(s) related to HTN, HLD, Depression, and Cerebral Palsy :  Over the next 120 days, patient will:   Work with the care management team to address educational, disease management, and care coordination needs   Begin or continue self health monitoring activities as directed today Measure and record blood pressure 4 times per week  Call provider office for new or worsened signs and symptoms Blood pressure findings outside established parameters and New or worsened symptom related to HLD, Depression or Cerebral Palsy  Call care management team with questions or concerns  Verbalize basic understanding of patient centered plan of care established today  Interventions related to HTN, HLD, Depression, and Cerebral Palsy :   Evaluation of current treatment plans and patient's adherence to plan as established by provider. 11-05-2020: the patient is doing well and is following the regimen of the provider.   Assessed patient understanding of disease states.  The patient denies changes in her condition. Does not like to discuss health at length. Becomes more open with each new outreach. The patient is taking her blood pressure at home. Last reading was 133/67. 07-02-2020- The patient did not know her actual last blood pressure reading but says it was around 124/70 and her aide takes it regularly. The patient has been working with therapy and is getting stronger. 11-05-2020: the patient is doing well and denies any new concerns. States her blood pressure is stable. She has not taken her blood pressure in a while but states she feels fine.   Assessed patient's education and care coordination needs. Discussed CCM team  pharmacist and LCSW. The patient is receptive to CCM team involvement. The patient is working with the LCSW and was given the pharmacist number today to help with medication assistance. Continues to work with the patient. Has an appointment tomorrow with the LCSW.   Provided disease specific education to patient.  The patient states she is trying to watch her salt intake. The patient is direct with responses to questions. Also discussed if the patient has decreased smoking and the patient has not at this time. Did not wish to discuss smoking cessation further. 11-05-2020: the patient states her appetite is better and she is eating better. Denies any new concerns related to dietary restrictions.   Assessed the patients activity level. The patient is not active and limited in her ability to exercise. She has an aide that comes into the home to help her with ADLS/IADLS.  Also working with PT.   Collaborated with appropriate clinical care team members regarding patient needs  Assessed for upcoming appointments. The patient has no upcoming appointments at this time with pcp. Knows to call with changes. Will continue to monitor.   Patient Self Care Activities related to HTN, HLD, Depression, and Cerebral Palsy :   Patient is unable to independently self-manage chronic health conditions  Please see past updates related to this goal by clicking on the "Past Updates" button in the selected goal        RNCM: Depression and stress        CARE PLAN ENTRY (see longtitudinal plan of care for additional care plan information)  Current Barriers:   Knowledge Deficits related to depression and stress and how to effectively manage  Lacks caregiver support.   Cognitive Deficits  Nurse Case Manager Clinical Goal(s):   Over the next 120 days, patient will verbalize understanding of plan for effectively managing depression and anxiety  Over the next 120 days, patient will work with pcp, RNCM and LCSW  to  address needs related to how to have effective coping skills to deal with depression, stress, and anxiety  Over the next 120 days, patient will demonstrate a decrease in anxiety exacerbations as evidenced by following the plan of care directed by the pcp, taking medications as prescribed and working with resources to help with coping mechanisms.   Over the next 120 days, patient will attend all scheduled medical appointments: follow up with pcp after medication changes on 03-04-2020  Over the next 120 days, patient will demonstrate improved adherence to prescribed treatment plan for depression, anxiety and stress as evidenced byreturn to baseline status related to chronic depression  Over the next 120 days, patient will work with CM clinical social worker to establish rapport and work with patient on effective coping skills when she is feeling more depressed, stressed, or anxious  Interventions:   Evaluation of current treatment plan related to depression, stress, and anxiety and patient's adherence to plan as established by provider.  Advised patient to discuss her concerns and changes with her pcp. The patient does not like to talk a lot with the [facility] about her depression.  RNCM working with the patient to establish rapport and help the patient feel at ease with discussing her conditions. 05-01-2020: the patient states she is managing her depression well at this time. Is interacting with the LCSW also. 07-02-2020: the patient states her depression is at a good place. She does want to get out more but is mindful of the current situation with COVID19.  Review of safety precautions.  The patient states they had a party for her moms birthday and she enjoyed that. States she was being safe. Each new outreach the patient is more talkative with the RNCM. Is appreciative of calls to check on her. 11-05-2020: The patient states she is doing well. Denies any issues with depression, anxiety, or stress. Will  continue to monitor for changes.   Provided education to patient re: keeping appointments, available resources and CCM team here to help with any questions or concerns.   Collaborated with pcp and LCSW regarding patients almost 'flat affect' in her voice and the best way to help the patient with her chronic conditions of depression and anxiety.  11-05-2020: the CCM team is actively working with the patient and establishing rapport with the patient. The patient seems to be more engaged with each new outreach.   Discussed plans with patient for ongoing care management follow up and provided patient with direct contact information for care management team  Reviewed scheduled/upcoming provider appointments including: No new appointments at this time. Will continue to monitor for changes.   Social Work referral for support with patient with chronic depression and anxiety with effective coping skills. Actively working with the [employer]  Patient Self Care Activities:   Attends all scheduled provider appointments  Calls provider office for new concerns or questions  Unable to independently manage depression and anxiety  Unable to perform ADLs independently  Unable to perform IADLs independently  Please see past updates related to this goal by clicking on the "Past Updates" button in the selected goal  The patient verbalized understanding of instructions, educational materials, and care plan provided today and declined offer to receive copy of patient instructions, educational materials, and care plan.   Telephone follow up appointment with care management team member scheduled for: 01-08-2021 at 1 pm  Noreene Larsson RN, MSN, [facility] Mobile: [phone]

## 2020-11-05 NOTE — Chronic Care Management (AMB) (Signed)
Chronic Care Management   Follow Up Note   11/05/2020 Name: Michelle Chandler MRN: [id number] DOB: [birth year]  Referred by: Steele Sizer, MD Reason for referral : Chronic Care Management (RNCM Follow up Call for Chronic Disease Management and Care Coordination Needs)   Michelle Chandler is a 64 y.o. year old female who is a primary care patient of Crissman, Michelle Gainer, MD. The CCM team was consulted for assistance with chronic disease management and care coordination needs.    Review of patient status, including review of consultants reports, relevant laboratory and other test results, and collaboration with appropriate care team members and the patient's provider was performed as part of comprehensive patient evaluation and provision of chronic care management services.    SDOH (Social Determinants of Health) assessments performed: Yes See Care Plan activities for detailed interventions related to [name])     Outpatient Encounter Medications as of 11/05/2020  Medication Sig Note  . aspirin EC 81 MG tablet Take 81 mg by mouth daily. Patient taking  differently,,,,, taking every other day 09/06/2020: taking every other day  . atorvastatin (LIPITOR) 40 MG tablet Take 1 tablet (40 mg total) by mouth daily.   . baclofen (LIORESAL) 10 MG tablet Take 1 tablet (10 mg total) by mouth 2 (two) times daily.   [name] FLUoxetine (PROZAC) 10 MG capsule Take 1 capsule (10 mg total) by mouth daily.   [name] FLUoxetine (PROZAC) 20 MG capsule Take 1 capsule (20 mg total) by mouth daily.   . hydrochlorothiazide (HYDRODIURIL) 25 MG tablet Take 1 tablet (25 mg total) by mouth daily.   [name] ibuprofen (ADVIL) 400 MG tablet Take 1 tablet (400 mg total) by mouth 3 (three) times daily as needed.   . Incontinence Supply Disposable (ENTRUST PLUS DISP UNDERPADS) MISC by Does not apply route.   . Incontinence Supply Disposable (PROTECTIVE UNDERWEAR LARGE) MISC by Does not apply route.   [name] LORazepam (ATIVAN) 1 MG tablet TAKE ONE TABLET DAILY  AS NEEDED FOR ANXIETY.   . meloxicam (MOBIC) 15 MG tablet Take 1 tablet (15 mg total) by mouth daily.   [name] omeprazole (PRILOSEC) 20 MG capsule Take 1 capsule (20 mg total) by mouth daily.    No facility-administered encounter medications on file as of 11/05/2020.     Objective:  BP Readings from Last 3 Encounters:  10/04/20 129/78  07/11/20 130/79  06/10/20 (!) 130/82    Goals Addressed              This Visit's Progress   .  RNCM: "I eat regular food" (pt-stated)        Current Barriers:  . Chronic Disease Management support, education, and care coordination needs related to HTN, HLD, Depression, and Cerebral Palsy  Clinical Goal(s) related to HTN, HLD, Depression, and Cerebral Palsy :  Over the next 120 days, patient will:  . Work with the care management team to address educational, disease management, and care coordination needs  . Begin or continue self health monitoring activities as directed today Measure and record blood pressure 4 times per week . Call provider office for new or worsened signs and symptoms Blood pressure findings outside established parameters and New or worsened symptom related to HLD, Depression or Cerebral Palsy . Call care management team with questions or concerns . Verbalize basic understanding of patient centered plan of care established today  Interventions related to HTN, HLD, Depression, and Cerebral Palsy :  . Evaluation of current treatment plans and patient's adherence to  plan as established by provider. 11-05-2020: the patient is doing well and is following the regimen of the provider.  . Assessed patient understanding of disease states.  The patient denies changes in her condition. Does not like to discuss health at length. Becomes more open with each new outreach. The patient is taking her blood pressure at home. Last reading was 133/67. 07-02-2020- The patient did not know her actual last blood pressure reading but says it was around 124/70  and her aide takes it regularly. The patient has been working with therapy and is getting stronger. 11-05-2020: the patient is doing well and denies any new concerns. States her blood pressure is stable. She has not taken her blood pressure in a while but states she feels fine.  . Assessed patient's education and care coordination needs. Discussed CCM team pharmacist and LCSW. The patient is receptive to CCM team involvement. The patient is working with the LCSW and was given the pharmacist number today to help with medication assistance. Continues to work with the patient. Has an appointment tomorrow with the LCSW.  [name] Provided disease specific education to patient.  The patient states she is trying to watch her salt intake. The patient is direct with responses to questions. Also discussed if the patient has decreased smoking and the patient has not at this time. Did not wish to discuss smoking cessation further. 11-05-2020: the patient states her appetite is better and she is eating better. Denies any new concerns related to dietary restrictions.  . Assessed the patients activity level. The patient is not active and limited in her ability to exercise. She has an aide that comes into the home to help her with ADLS/IADLS.  Also working with PT.  [name] with appropriate clinical care team members regarding patient needs . Assessed for upcoming appointments. The patient has no upcoming appointments at this time with pcp. Knows to call with changes. Will continue to monitor.   Patient Self Care Activities related to HTN, HLD, Depression, and Cerebral Palsy :  . Patient is unable to independently self-manage chronic health conditions  Please see past updates related to this goal by clicking on the "Past Updates" button in the selected goal      .  RNCM: Depression and stress        CARE PLAN ENTRY (see longtitudinal plan of care for additional care plan information)  Current Barriers:   [name] Knowledge Deficits related to depression and stress and how to effectively manage . Lacks caregiver support.  . Cognitive Deficits  Nurse Case Manager Clinical Goal(s):  [name] Over the next 120 days, patient will verbalize understanding of plan for effectively managing depression and anxiety . Over the next 120 days, patient will work with pcp, RNCM and LCSW  to address needs related to how to have effective coping skills to deal with depression, stress, and anxiety . Over the next 120 days, patient will demonstrate a decrease in anxiety exacerbations as evidenced by following the plan of care directed by the pcp, taking medications as prescribed and working with resources to help with coping mechanisms.  . Over the next 120 days, patient will attend all scheduled medical appointments: follow up with pcp after medication changes on 03-04-2020 . Over the next 120 days, patient will demonstrate improved adherence to prescribed treatment plan for depression, anxiety and stress as evidenced byreturn to baseline status related to chronic depression . Over the next 120 days, patient will work with CM clinical social  worker to establish rapport and work with patient on effective coping skills when she is feeling more depressed, stressed, or anxious  Interventions:  . Evaluation of current treatment plan related to depression, stress, and anxiety and patient's adherence to plan as established by provider. . Advised patient to discuss her concerns and changes with her pcp. The patient does not like to talk a lot with the [facility] about her depression.  RNCM working with the patient to establish rapport and help the patient feel at ease with discussing her conditions. 05-01-2020: the patient states she is managing her depression well at this time. Is interacting with the LCSW also. 07-02-2020: the patient states her depression is at a good place. She does want to get out more but is mindful of the current situation  with COVID19.  Review of safety precautions.  The patient states they had a party for her moms birthday and she enjoyed that. States she was being safe. Each new outreach the patient is more talkative with the RNCM. Is appreciative of calls to check on her. 11-05-2020: The patient states she is doing well. Denies any issues with depression, anxiety, or stress. Will continue to monitor for changes.  . Provided education to patient re: keeping appointments, available resources and CCM team here to help with any questions or concerns.  [name] Collaborated with pcp and LCSW regarding patients almost 'flat affect' in her voice and the best way to help the patient with her chronic conditions of depression and anxiety.  11-05-2020: the CCM team is actively working with the patient and establishing rapport with the patient. The patient seems to be more engaged with each new outreach.  . Discussed plans with patient for ongoing care management follow up and provided patient with direct contact information for care management team . Reviewed scheduled/upcoming provider appointments including: No new appointments at this time. Will continue to monitor for changes.  . Social Work referral for support with patient with chronic depression and anxiety with effective coping skills. Actively working with the [employer]  Patient Self Care Activities:  . Attends all scheduled provider appointments . Calls provider office for new concerns or questions . Unable to independently manage depression and anxiety . Unable to perform ADLs independently . Unable to perform IADLs independently  Please see past updates related to this goal by clicking on the "Past Updates" button in the selected goal           Plan:   Telephone follow up appointment with care management team member scheduled for: 01-08-2021 at 1 pm   Noreene Larsson RN, MSN, [facility] Mobile: [phone]

## 2020-11-11 ENCOUNTER — Ambulatory Visit: Payer: Medicare Other | Admitting: Pharmacist

## 2020-11-11 DIAGNOSIS — I10 Essential (primary) hypertension: Secondary | ICD-10-CM

## 2020-11-11 DIAGNOSIS — F17210 Nicotine dependence, cigarettes, uncomplicated: Secondary | ICD-10-CM

## 2020-11-11 DIAGNOSIS — E7800 Pure hypercholesterolemia, unspecified: Secondary | ICD-10-CM

## 2020-11-11 NOTE — Patient Instructions (Signed)
Visit Information  It was a pleasure speaking with you today. Thank you for letting me be part of your clinical team. Please call with any questions or concerns.   Goals Addressed            This Visit's Progress    Pharmacy Care Plan       CARE PLAN ENTRY (see longitudinal plan of care for additional care plan information)  Current Barriers:   Chronic Disease Management support, education, and care coordination needs related to Hypertension, Hyperlipidemia, GERD, Depression, Tobacco use, and Cerebral Palsy   Hypertension BP Readings from Last 3 Encounters:  10/04/20 129/78  07/11/20 130/79  06/10/20 (!) 130/82    Pharmacist Clinical Goal(s): o Over the next 60  days, patient will work with PharmD and providers to maintain BP goal <130/80  Current regimen:  o HCTZ 25 mg daily  Interventions: o Reviewed proper BP technique o Discussed diet and sodium content of food o Encouraged smoking cessation  Patient self care activities - Over the next 60 days, patient will: o Check BP 1-2 times weekly, document, and provide at future appointments o Ensure daily salt intake < 2300 mg/day o Consider smoking cessation  Hyperlipidemia Lab Results  Component Value Date/Time   LDLCALC 102 (H) 10/04/2020 11:03 AM    Pharmacist Clinical Goal(s): o Over the next 60 days, patient will work with PharmD and providers to achieve LDL goal < 100  Current regimen:   Atorvastatin 40 mg qd  Aspirin 81 mg qd  Interventions: o Provided diet  counseling. o Confirmed patient has replaced simvastatin with atorvastatin and is not taking both o Reviewed goal lipid values o Counseled on benefit of smoking cessation.  Patient self care activities - Over the next 60 days, patient will: o Attend all scheduled appointments o Refill prescriptions regularly o Focus on heart healthy diet o Consider smoking cessation  Medication management  Pharmacist Clinical Goal(s): o Over the next 60  days, patient will work with PharmD and providers to achieve optimal medication adherence  Current pharmacy: [facility]  Interventions o Comprehensive medication review performed. o Continue current medication management strategy  Patient self care activities - Over the next 60 days, patient will: o Focus on medication adherence by fill dates o Take medications as prescribed o Report any questions or concerns to PharmD and/or provider(s)  Please see past updates related to this goal by clicking on the "Past Updates" button in the selected goal         The patient verbalized understanding of instructions, educational materials, and care plan provided today and agreed to receive a mailed copy of patient instructions, educational materials, and care plan.   Telephone follow up appointment with pharmacy team member scheduled for:  3 months  Junita Push. Kenton Kingfisher PharmD, [place] Clinical Pharmacist [phone]

## 2020-11-11 NOTE — Chronic Care Management (AMB) (Signed)
Chronic Care Management Pharmacy  Name: Michelle Chandler  MRN: [id number] DOB: 03-Oct-1956   Chief Complaint/ HPI  Michelle Chandler,  64 y.o. , female presents for their Follow-Up CCM visit with the clinical pharmacist via telephone.  PCP : Guadalupe Maple, MD Patient Care Team: Guadalupe Maple, MD as PCP - General (Family Medicine) Vanita Ingles, RN as Case Manager ([place]) Greg Cutter, LCSW as Social Worker (Licensed Clinical Social Worker)  Their chronic conditions include: Hypertension, Hyperlipidemia, GERD, Depression, Tobacco use and Cerebral Palsy   Office Visits: 10/04/20- Noemi Chapel, NP- A1c. lipids 07/12/20 [name], PA change to atorvastatin 40 mg, LDL 240, cologuard ordered  Consult Visit: 07/24/20-[facility] cancer screening--patient declined lung screening   Allergies  Allergen Reactions   No Known Allergies     Medications: Outpatient Encounter Medications as of 11/11/2020  Medication Sig Note   aspirin EC 81 MG tablet Take 81 mg by mouth daily. Patient taking  differently,,,,, taking every other day 09/06/2020: taking every other day   atorvastatin (LIPITOR) 40 MG tablet Take 1 tablet (40 mg total) by mouth daily.    baclofen (LIORESAL) 10 MG tablet Take 1 tablet (10 mg total) by mouth 2 (two) times daily.    FLUoxetine (PROZAC) 10 MG capsule Take 1 capsule (10 mg total) by mouth daily.    FLUoxetine (PROZAC) 20 MG capsule Take 1 capsule (20 mg total) by mouth daily.    hydrochlorothiazide (HYDRODIURIL) 25 MG tablet Take 1 tablet (25 mg total) by mouth daily.    ibuprofen (ADVIL) 400 MG tablet Take 1 tablet (400 mg total) by mouth 3 (three) times daily as needed.    Incontinence Supply Disposable (ENTRUST PLUS DISP UNDERPADS) MISC by Does not apply route.    Incontinence Supply Disposable (PROTECTIVE UNDERWEAR LARGE) MISC by Does not apply route.    LORazepam (ATIVAN) 1 MG tablet TAKE ONE TABLET DAILY AS NEEDED FOR ANXIETY.     meloxicam (MOBIC) 15 MG tablet Take 1 tablet (15 mg total) by mouth daily.    omeprazole (PRILOSEC) 20 MG capsule Take 1 capsule (20 mg total) by mouth daily.    No facility-administered encounter medications on file as of 11/11/2020.    Wt Readings from Last 3 Encounters:  10/04/20 142 lb 6.4 oz (64.6 kg)  07/26/20 145 lb (65.8 kg)  07/11/20 146 lb (66.2 kg)    Current Diagnosis/Assessment:    Goals Addressed   NoneCARE PLAN ENTRY (see longitudinal plan of care for additional care plan information)  Current Barriers:   Chronic Disease Management support, education, and care coordination needs related to Hypertension, Hyperlipidemia, GERD, Depression, Tobacco use, and Cerebral Palsy   Hypertension BP Readings from Last 3 Encounters:  10/04/20 129/78  07/11/20 130/79  06/10/20 (!) 130/82    Pharmacist Clinical Goal(s): o Over the next 60  days, patient will work with PharmD and providers to maintain BP goal <130/80  Current regimen:  o HCTZ 25 mg daily  Interventions: o Reviewed proper BP technique o Discussed diet and sodium content of food o Encouraged smoking cessation  Patient self care activities - Over the next 60 days, patient will: o Check BP 1-2 times weekly, document, and provide at future appointments o Ensure daily salt intake < 2300 mg/day o Consider smoking cessation  Hyperlipidemia Lab Results  Component Value Date/Time   LDLCALC 102 (H) 10/04/2020 11:03 AM    Pharmacist Clinical Goal(s): o Over the next 60 days, patient will work  with PharmD and providers to achieve LDL goal < 100  Current regimen:   Atorvastatin 40 mg qd  Aspirin 81 mg qd  Interventions: o Provided diet  counseling. o Confirmed patient has replaced simvastatin with atorvastatin and is not taking both o Reviewed goal lipid values o Counseled on benefit of smoking cessation.  Patient self care activities - Over the next 60 days, patient will: o Attend all scheduled  appointments o Refill prescriptions regularly o Focus on heart healthy diet o Consider smoking cessation  Medication management  Pharmacist Clinical Goal(s): o Over the next 60 days, patient will work with PharmD and providers to achieve optimal medication adherence  Current pharmacy: [facility]  Interventions o Comprehensive medication review performed. o Continue current medication management strategy  Patient self care activities - Over the next 60 days, patient will: o Focus on medication adherence by fill dates o Take medications as prescribed o Report any questions or concerns to PharmD and/or provider(s)  Please see past updates related to this goal by clicking on the "Past Updates" button in the selected goal        Hyperlipidemia   LDL goal < 100  Lipid Panel     Component Value Date/Time   CHOL 170 10/04/2020 1103   CHOL 231 (H) 08/16/2018 1049   TRIG 110 10/04/2020 1103   TRIG 266 (H) 08/16/2018 1049   HDL 48 10/04/2020 1103   LDLCALC 102 (H) 10/04/2020 1103    Hepatic Function Latest Ref Rng & Units 07/11/2020 02/05/2020 08/16/2018  Total Protein 6.0 - 8.5 g/dL 7.0 7.0 -  Albumin 3.8 - 4.8 g/dL 4.3 4.2 -  AST 0 - 40 IU/L '12 8 20  ' ALT 0 - 32 IU/L '11 8 21  ' Alk Phosphatase 48 - 121 IU/L 110 111 -  Total Bilirubin 0.0 - 1.2 mg/dL 0.2 0.2 -     The 10-year ASCVD risk score Mikey Bussing DC Jr., et al., 2013) is: 10.5%   Values used to calculate the score:     Age: 12 years     Sex: Female     Is Non-Hispanic African American: No     Diabetic: No     Tobacco smoker: Yes     Systolic Blood Pressure: 470 mmHg     Is BP treated: Yes     HDL Cholesterol: 48 mg/dL     Total Cholesterol: 170 mg/dL   Patient has failed these meds in past: Simvastatin Patient is currently uncontrolled on the following medications:   Atorvastatin 40 mg qd  Aspirin 81 mg qd  We discussed:  Patient has replaced simvastatin with atorvastatin as prescribed by [name], PA.  She is tolerating well. Denies any missed doses. Reviewed increased cholesterol levels from last labs in March.Patient reports missing ~ one month of simvastatin between lab values. Patient aware of follow-up appointment in November. Discussed meloxicam  And aspirin use increases risk of GI Bleed. Patient denies any signs/symptoms of bleeding.  Plan  Continue current medications.   Hypertension   BP goal is:  <130/80  Office blood pressures are  BP Readings from Last 3 Encounters:  10/04/20 129/78  07/11/20 130/79  06/10/20 (!) 130/82   Patient checks BP at home "once in a while" Patient home BP readings are ranging: 133/83  During our conversation.  Patient has failed these meds in the past: NA Patient is currently query  controlled on the following medications:   HCTZ 25 mg qd  We discussed diet and exercise extensively and smoking cessation. Patient states she has not really cut back on her cigarettes. She states she no longer has an aide in the home and hasn't checked her BP since her last office visit before today.  We discussed her goal BP and encouraged patient to self monitor 1-2 times weekly. She has three HCTZ tabs remaining and will get her refill this week.   Plan  Continue current medications     Vaccines   Reviewed and discussed patient's vaccination history.    Immunization History  Administered Date(s) Administered   Influenza,inj,Quad PF,6+ Mos 10/19/2017, 08/16/2018   Influenza-Unspecified 11/14/2014, 10/05/2016, 08/26/2020   PFIZER SARS-COV-2 Vaccination 05/03/2020, 05/22/2020   Td 08/30/2007, 10/19/2017  Patient received 1st dose of Shingrix last week.  She will take COVID booster and second  Shingrix dose at [employer]. States she had flu shot in October   Plan  Recommended patient receive booster and Shingrix 2nd dose as planned.  Cerebral palsy   Patient has failed these meds in past: NA Patient is currently query controlled on the  following medications:   Baclofen 10 mg qd  Meloxicam prn  Ibuprofen 400 mg prn  We discussed:  Patient states she is taking meloxicam or ibuprofen only as needed which is less than once weekly. She denies any signs/symptoms of bleeding.  Plan  Continue current medications    Medication Management   Pt uses [place] for all medications Uses pill box? No - does not feel she needs Pt endorses 99% compliance  We discussed: Patient is satisfied with pharmacy services.   Plan  Continue current medication management strategy    Follow up: 2 month phone visit  Junita Push. Kenton Kingfisher PharmD, [facility] [phone]

## 2020-12-05 ENCOUNTER — Other Ambulatory Visit: Payer: Self-pay

## 2020-12-05 DIAGNOSIS — G801 Spastic diplegic cerebral palsy: Secondary | ICD-10-CM

## 2020-12-05 MED ORDER — IBUPROFEN 400 MG PO TABS: 400.0000 mg | ORAL_TABLET | Freq: Three times a day (TID) | ORAL | 0 refills | Status: DC | PRN

## 2020-12-05 NOTE — Telephone Encounter (Signed)
Patient last seen 10/04/20.

## 2020-12-11 ENCOUNTER — Telehealth: Payer: Self-pay

## 2020-12-27 ENCOUNTER — Telehealth: Payer: Medicare Other

## 2020-12-27 ENCOUNTER — Ambulatory Visit: Payer: Self-pay | Admitting: Licensed Clinical Social Worker

## 2020-12-27 NOTE — Chronic Care Management (AMB) (Signed)
  Care Management   Follow Up Note   12/27/2020 Name: Michelle Chandler MRN: [id number] DOB: 1956-04-25   Referred by: Guadalupe Maple, MD Reason for referral : Michelle Chandler is a 65 y.o. year old female who is a primary care patient of Crissman, Jeannette How, MD. The care management team was consulted for assistance with care management and care coordination needs.    Review of patient status, including review of consultants reports, relevant laboratory and other test results, and collaboration with appropriate care team members and the patient's provider was performed as part of comprehensive patient evaluation and provision of chronic care management services.    LCSW completed CCM outreach attempt today but was unable to reach patient successfully. A HIPPA compliant voice message was left encouraging patient to return call once available. LCSW will ask Scheduling Care Guide to reschedule CCM SW appointment with patient as well.  Eula Fried, BSW, MSW, [facility] Care Management [email or web address] Phone: [phone]

## 2021-01-08 ENCOUNTER — Telehealth: Payer: Medicare Other | Admitting: General Practice

## 2021-01-08 ENCOUNTER — Ambulatory Visit (INDEPENDENT_AMBULATORY_CARE_PROVIDER_SITE_OTHER): Payer: Medicare Other | Admitting: General Practice

## 2021-01-08 DIAGNOSIS — F339 Major depressive disorder, recurrent, unspecified: Secondary | ICD-10-CM

## 2021-01-08 DIAGNOSIS — F419 Anxiety disorder, unspecified: Secondary | ICD-10-CM

## 2021-01-08 DIAGNOSIS — E7800 Pure hypercholesterolemia, unspecified: Secondary | ICD-10-CM

## 2021-01-08 NOTE — Chronic Care Management (AMB) (Signed)
Chronic Care Management   CCM RN Visit Note  01/08/2021 Name: Michelle Chandler MRN: [id number] DOB: 1956/07/04  Subjective: Michelle Chandler is a 65 y.o. year old female who is a primary care patient of Vigg, Avanti, MD. The care management team was consulted for assistance with disease management and care coordination needs.    Engaged with patient by telephone for follow up visit in response to provider referral for case management and/or care coordination services.   Consent to Services:  The patient was given information about Chronic Care Management services, agreed to services, and gave verbal consent prior to initiation of services.  Please see initial visit note for detailed documentation.   Patient agreed to services and verbal consent obtained.   Assessment: Review of patient past medical history, allergies, medications, health status, including review of consultants reports, laboratory and other test data, was performed as part of comprehensive evaluation and provision of chronic care management services.   SDOH (Social Determinants of Health) assessments and interventions performed:    CCM Care Plan  Allergies  Allergen Reactions  . No Known Allergies     Outpatient Encounter Medications as of 01/08/2021  Medication Sig Note  . aspirin EC 81 MG tablet Take 81 mg by mouth daily. Patient taking  differently,,,,, taking every other day 09/06/2020: taking every other day  . atorvastatin (LIPITOR) 40 MG tablet Take 1 tablet (40 mg total) by mouth daily.   . baclofen (LIORESAL) 10 MG tablet Take 1 tablet (10 mg total) by mouth 2 (two) times daily.   [name] FLUoxetine (PROZAC) 10 MG capsule Take 1 capsule (10 mg total) by mouth daily.   [name] FLUoxetine (PROZAC) 20 MG capsule Take 1 capsule (20 mg total) by mouth daily.   . hydrochlorothiazide (HYDRODIURIL) 25 MG tablet Take 1 tablet (25 mg total) by mouth daily.   [name] ibuprofen (ADVIL) 400 MG tablet Take 1 tablet (400 mg total) by mouth 3 (three)  times daily as needed.   . Incontinence Supply Disposable (ENTRUST PLUS DISP UNDERPADS) MISC by Does not apply route.   . Incontinence Supply Disposable (PROTECTIVE UNDERWEAR LARGE) MISC by Does not apply route.   [name] LORazepam (ATIVAN) 1 MG tablet TAKE ONE TABLET DAILY AS NEEDED FOR ANXIETY.   . meloxicam (MOBIC) 15 MG tablet Take 1 tablet (15 mg total) by mouth daily. (Patient taking differently: Take 15 mg by mouth daily. Taking prn)   . omeprazole (PRILOSEC) 20 MG capsule Take 1 capsule (20 mg total) by mouth daily.    No facility-administered encounter medications on file as of 01/08/2021.    Patient Active Problem List   Diagnosis Date Noted  . Prediabetes 10/07/2020  . Gastroesophageal reflux disease 06/10/2020  . Smoking greater than 40 pack years 06/10/2020  . Cerebral palsy ([place]) 12/27/2015  . Hyperlipidemia 12/27/2015  . Essential (primary) hypertension 12/27/2015  . Depression, recurrent ([place]) 11/20/2015    Conditions to be addressed/monitored:HLD, Anxiety and Depression  Care Plan : RNCM: Depression (Adult) and anxiety  Updates made by Vanita Ingles since 01/08/2021 12:00 AM    Problem: RNCM: Depression Identification (Depression) and Anxiety   Priority: Medium    Long-Range Goal: RNCM: Depressive Symptoms Identified/Anxiety triggers   Priority: Medium  Note:   Current Barriers:  [name] Knowledge Deficits related to resources available for effective management of depression and anxiety  . Chronic Disease Management support and education needs related to depression and anxiety  . Lacks caregiver support.  . Limited mobility- CP .  Lacks social connections . Unable to perform ADLs independently . Unable to perform IADLs independently . Does not contact provider office for questions/concerns  Nurse Case Manager Clinical Goal(s):  . patient will verbalize understanding of plan for effective management of anxiety and depression  . patient will work with [place] [place] team, and  pcp  to address needs related to effective management of depression and anxiety   Interventions:  . 1:1 collaboration with Charlynne Cousins, MD regarding development and update of comprehensive plan of care as evidenced by provider attestation and co-signature . Inter-disciplinary care team collaboration (see longitudinal plan of care) . Evaluation of current treatment plan related to depression and anxiety  and patient's adherence to plan as established by provider. . Advised patient to call the office for changes in condition/ changes in mood/increased anxiety or depression . Provided education to patient re: alternative methods to managing depression and anxiety. Cutting back on smoking. The patient is not ready to change her smoking habits. She smokes as a coping mechanism.  She also watches a lot of TV. [name] Reviewed scheduled/upcoming provider appointments including: Does not have an upcoming appointment but plans to call the office next week to get an appointment with pcp for follow up.  . Discussed plans with patient for ongoing care management follow up and provided patient with direct contact information for care management team  Patient Goals/Self-Care Activities Over the next 120 days, patient will:  - Patient will self administer medications as prescribed Patient will attend all scheduled provider appointments Patient will call pharmacy for medication refills Patient will attend church or other social activities Patient will call provider office for new concerns or questions Patient will work with BSW to address care coordination needs and will continue to work with the clinical team to address health care and disease management related needs.   - anxiety screen reviewed - depression screen reviewed - medication list reviewed  Follow Up Plan: Telephone follow up appointment with care management team member scheduled for:03-05-2021 at 1 pm        Task: RNCM: Identify Depressive  Symptoms and Facilitate Treatment   Note:   Care Management Activities:    - anxiety screen reviewed - depression screen reviewed - medication list reviewed       Care Plan : RNCM: HLD Management  Updates made by Vanita Ingles since 01/08/2021 12:00 AM    Problem: RNCM: HLD Management   Priority: Medium    Long-Range Goal: RNCM: HLD Management   Priority: Medium  Note:   Current Barriers:  . Poorly controlled hyperlipidemia, complicated by smoker, limited mobility, poor dietary choices  . Current antihyperlipidemic regimen: atorvastatin 40 mg QD . Most recent lipid panel:     Component Value Date/Time   CHOL 170 10/04/2020 1103   CHOL 231 (H) 08/16/2018 1049   TRIG 110 10/04/2020 1103   TRIG 266 (H) 08/16/2018 1049   HDL 48 10/04/2020 1103   CHOLHDL 4.2 12/28/2016 1406   VLDL 53 (H) 08/16/2018 1049   LDLCALC 102 (H) 10/04/2020 1103 .   [name] ASCVD risk enhancing conditions: age 89, current smoker . Lacks social connections . Does not contact provider office for questions/concerns  RN Care Manager Clinical Goal(s):  [name] Over the next 120 days, patient will work with Consulting civil engineer, providers, and care team towards execution of optimized self-health management plan . patient will verbalize understanding of plan for effective management of HLD  . patient will work with [facility] and  pcp  to address needs related to HLD   Interventions: . Collaboration with Charlynne Cousins, MD regarding development and update of comprehensive plan of care as evidenced by provider attestation and co-signature . Inter-disciplinary care team collaboration (see longitudinal plan of care) . Medication review performed; medication list updated in electronic medical record.  Bertram Savin care team collaboration (see longitudinal plan of care) . Referred to pharmacy team for assistance with HLD medication management . Evaluation of current treatment plan related to HLD  and patient's adherence to plan  as established by provider. . Advised patient to call the office for changes or questions  . Provided education to patient re: heart healthy diet and monitoring for excess sodium and fats. The patient verbalized she is eating better than what she was.  She is cutting back on her unhealthy options and eating more than one time a day. Praised for positive changes  . Reviewed medications with patient and discussed compliance. The patient is taking medications as directed . Discussed plans with patient for ongoing care management follow up and provided patient with direct contact information for care management team   Patient Goals/Self-Care Activities: . Over the next 120 days, patient will:   - call for medicine refill 2 or 3 days before it runs out - call if I am sick and can't take my medicine - keep a list of all the medicines I take; vitamins and herbals too - learn to read medicine labels - use a pillbox to sort medicine - use an alarm clock or phone to remind me to take my medicine - change to whole grain breads, cereal, pasta - drink 6 to 8 glasses of water each day - eat 3 to 5 servings of fruits and vegetables each day - eat 5 or 6 small meals each day - fill half the plate with nonstarchy vegetables - limit fast food meals to no more than 1 per week - manage portion size - prepare main meal at home 3 to 5 days each week - read food labels for fat, fiber, carbohydrates and portion size - be open to making changes - I can manage, know and watch for signs of a heart attack - if I have chest pain, call for help - learn about small changes that will make a big difference - learn my personal risk factors  - barriers to meeting goals identified - change-talk evoked - choices provided - collaboration with team encouraged - decision-making supported - health risks reviewed - problem-solving facilitated - questions answered - readiness for change evaluated - reassurance  provided - self-reflection promoted - self-reliance encouraged  Follow Up Plan: Telephone follow up appointment with care management team member scheduled for:03-05-2021 at 1pm      Task: RNCM: Mutually Develop and Royce Macadamia Achievement of Patient Goals   Note:   Care Management Activities:    - barriers to meeting goals identified - change-talk evoked - choices provided - collaboration with team encouraged - decision-making supported - health risks reviewed - problem-solving facilitated - questions answered - readiness for change evaluated - reassurance provided - self-reflection promoted - self-reliance encouraged       Plan:Telephone follow up appointment with care management team member scheduled for:  03-05-2021 at 1 pm  Noreene Larsson RN, MSN, [facility] Mobile: [phone]

## 2021-01-08 NOTE — Patient Instructions (Signed)
Visit Information  PATIENT GOALS: Goals Addressed              This Visit's Progress   .  COMPLETED: RNCM: "I eat regular food" (pt-stated)        Current Barriers: Closing this goal and opening in new ELS . Chronic Disease Management support, education, and care coordination needs related to HTN, HLD, Depression, and Cerebral Palsy  Clinical Goal(s) related to HTN, HLD, Depression, and Cerebral Palsy :  Over the next 120 days, patient will:  . Work with the care management team to address educational, disease management, and care coordination needs  . Begin or continue self health monitoring activities as directed today Measure and record blood pressure 4 times per week . Call provider office for new or worsened signs and symptoms Blood pressure findings outside established parameters and New or worsened symptom related to HLD, Depression or Cerebral Palsy . Call care management team with questions or concerns . Verbalize basic understanding of patient centered plan of care established today  Interventions related to HTN, HLD, Depression, and Cerebral Palsy :  . Evaluation of current treatment plans and patient's adherence to plan as established by provider. 11-05-2020: the patient is doing well and is following the regimen of the provider.  . Assessed patient understanding of disease states.  The patient denies changes in her condition. Does not like to discuss health at length. Becomes more open with each new outreach. The patient is taking her blood pressure at home. Last reading was 133/67. 07-02-2020- The patient did not know her actual last blood pressure reading but says it was around 124/70 and her aide takes it regularly. The patient has been working with therapy and is getting stronger. 11-05-2020: the patient is doing well and denies any new concerns. States her blood pressure is stable. She has not taken her blood pressure in a while but states she feels fine.  . Assessed  patient's education and care coordination needs. Discussed CCM team pharmacist and LCSW. The patient is receptive to CCM team involvement. The patient is working with the LCSW and was given the pharmacist number today to help with medication assistance. Continues to work with the patient. Has an appointment tomorrow with the LCSW.  [name] Provided disease specific education to patient.  The patient states she is trying to watch her salt intake. The patient is direct with responses to questions. Also discussed if the patient has decreased smoking and the patient has not at this time. Did not wish to discuss smoking cessation further. 11-05-2020: the patient states her appetite is better and she is eating better. Denies any new concerns related to dietary restrictions.  . Assessed the patients activity level. The patient is not active and limited in her ability to exercise. She has an aide that comes into the home to help her with ADLS/IADLS.  Also working with PT.  [name] with appropriate clinical care team members regarding patient needs . Assessed for upcoming appointments. The patient has no upcoming appointments at this time with pcp. Knows to call with changes. Will continue to monitor.   Patient Self Care Activities related to HTN, HLD, Depression, and Cerebral Palsy :  . Patient is unable to independently self-manage chronic health conditions  Please see past updates related to this goal by clicking on the "Past Updates" button in the selected goal      .  COMPLETED: RNCM: Depression and stress        CARE PLAN  ENTRY (see longtitudinal plan of care for additional care plan information)  Current Barriers: Closing this goal and opening in new ELS . Knowledge Deficits related to depression and stress and how to effectively manage . Lacks caregiver support.  . Cognitive Deficits  Nurse Case Manager Clinical Goal(s):  [name] Over the next 120 days, patient will verbalize understanding of plan for  effectively managing depression and anxiety . Over the next 120 days, patient will work with pcp, RNCM and LCSW  to address needs related to how to have effective coping skills to deal with depression, stress, and anxiety . Over the next 120 days, patient will demonstrate a decrease in anxiety exacerbations as evidenced by following the plan of care directed by the pcp, taking medications as prescribed and working with resources to help with coping mechanisms.  . Over the next 120 days, patient will attend all scheduled medical appointments: follow up with pcp after medication changes on 03-04-2020 . Over the next 120 days, patient will demonstrate improved adherence to prescribed treatment plan for depression, anxiety and stress as evidenced byreturn to baseline status related to chronic depression . Over the next 120 days, patient will work with CM clinical social worker to establish rapport and work with patient on effective coping skills when she is feeling more depressed, stressed, or anxious  Interventions:  . Evaluation of current treatment plan related to depression, stress, and anxiety and patient's adherence to plan as established by provider. . Advised patient to discuss her concerns and changes with her pcp. The patient does not like to talk a lot with the [facility] about her depression.  RNCM working with the patient to establish rapport and help the patient feel at ease with discussing her conditions. 05-01-2020: the patient states she is managing her depression well at this time. Is interacting with the LCSW also. 07-02-2020: the patient states her depression is at a good place. She does want to get out more but is mindful of the current situation with COVID19.  Review of safety precautions.  The patient states they had a party for her moms birthday and she enjoyed that. States she was being safe. Each new outreach the patient is more talkative with the RNCM. Is appreciative of calls to check on her.  11-05-2020: The patient states she is doing well. Denies any issues with depression, anxiety, or stress. Will continue to monitor for changes.  . Provided education to patient re: keeping appointments, available resources and CCM team here to help with any questions or concerns.  [name] Collaborated with pcp and LCSW regarding patients almost 'flat affect' in her voice and the best way to help the patient with her chronic conditions of depression and anxiety.  11-05-2020: the CCM team is actively working with the patient and establishing rapport with the patient. The patient seems to be more engaged with each new outreach.  . Discussed plans with patient for ongoing care management follow up and provided patient with direct contact information for care management team . Reviewed scheduled/upcoming provider appointments including: No new appointments at this time. Will continue to monitor for changes.  . Social Work referral for support with patient with chronic depression and anxiety with effective coping skills. Actively working with the [employer]  Patient Self Care Activities:  . Attends all scheduled provider appointments . Calls provider office for new concerns or questions . Unable to independently manage depression and anxiety . Unable to perform ADLs independently . Unable to perform IADLs independently  Please see past updates related to this goal by clicking on the "Past Updates" button in the selected goal      .  RNCM: Manage My Emotions        Timeframe:  Long-Range Goal Priority:  Medium Start Date:     01-08-2021                        Expected End Date:     01-08-2022                  Follow Up Date 03-05-2021   - call and visit an old friend - join a support group - laugh; watch a funny movie or comedian - learn and use visualization or guided imagery - perform a random act of kindness - practice relaxation or meditation daily - talk about feelings with a friend, family or spiritual  advisor - practice positive thinking and self-talk    Why is this important?    When you are stressed, down or upset, your body reacts too.   For example, your blood pressure may get higher; you may have a headache or stomachache.   When your emotions get the best of you, your body's ability to fight off cold and flu gets weak.   These steps will help you manage your emotions.     Notes: Will be happy when the weather is better and the time changes so she can go outside and sit      Patient Care Plan: General Social Work (Adult)    Problem Identified: Anxiety Identification (Anxiety)     Goal: Depressive Symptoms Identified   Note:   Evidence-based guidance:  Identify risk for depression by reviewing presenting symptoms and risk factors.  Review use of medications that contribute to depression such as steroid, narcotic, sedative, antihypertensive, beta blocker, cytoxic agent.  Review related metabolic processes, including infection, anemia, thyroid dysfunction, kidney failure, heart failure, alcohol or substance use.  Perform depression screening using standardized tools to obtain baseline intensity of depressive symptoms.  Perform or refer for a full diagnostic interview when positive screening results are noted; use DSM-5 criteria to determine appropriate diagnosis (e.g., major depression, persistent depressive disorder, unspecified depressive   disorder).   Notes:   Task: Identify Depressive Symptoms and Facilitate Treatment  Long-Range Goal: Anxiety Symptoms Identified   Start Date: 10/07/2020  This Visit's Progress: On track  Note:   Evidence-based guidance:  Assess for presence of additional co-occurring psychiatric comorbidity [e.g., substance use, other anxiety disorder (specific phobia, social anxiety disorder, panic disorder, agoraphobia, substance or medically-induced   anxiety disorder)].  Assess for presence of medical comorbidity (e.g., chronic pain, chronic  illness), recent or recurrent trauma or abuse, family history of substance use disorder or mental illness.  Screen for anxiety using standardized, validated tool.  Move gradually from investigating somatic complaints to exploring social or psychologic distress.  Assess for signs and symptoms of anxiety in an atmosphere of hope and optimism.  Facilitate full diagnostic interview when positive screening results are noted; utilize DSM-5 criteria to determine appropriate diagnosis.  Screen for depression simultaneously due to the frequency of co-occurrence.   Notes:    Current Barriers:  . Chronic Mental Health needs related to depression and stress . Limited social support . Mental Health Concerns  . Social Isolation . Suicidal Ideation/Homicidal Ideation: No  Clinical Social Work Goal(s):  [name] Over the next 120 days, patient will work with SW bi-monthly  by telephone or in person to reduce or manage symptoms related to depression . Over the next 120 days, patient will demonstrate improved health management independence as evidenced by implementing healthy self-care and positive support/resources into her daily routine to cope with stressors and improve overall health and well-being  . Over the next 120 days, patient will work with SW to address concerns related to gaining additional support/resource connection in order to maintain health . Over the next 120 days, patient will demonstrate improved adherence to self care as evidenced by implementing healthy self-care into her daily routine such as: attending all medical appointments, deep breathing exercsies, taking time for self-reflection, taking medications as prescribed, drinking water and daily exercise to improve mobility.   Interventions: . Patient interviewed and appropriate assessments performed . Positive reinforcement provided for positive self-care implementation over the last 60 days. Patient reports that she has intentionally made the  effort to be outside to gain vitamin D. Patient also shares that she is in need of increasing her socialization. Patient reports having a strong support network from her friends, family and neighbors who visit her regularly. She shares that she goes outside daily to get fresh air.  . Patient reports that she has not had to take her Lorazepam in over one month. . Provided patient with information about available mental health support resources and socialization opportunities for seniors within her nearby area.  . Discussed plans with patient for ongoing care management follow up and provided patient with direct contact information for care management team . LCSW was able to successfully build rapport with patient. [name] LCSW discussed coping skills for anxiety. SW used empathetic and active and reflective listening, validated patient's feelings/concerns, and provided emotional support. LCSW provided self-care education to help manage her multiple health conditions and improve her mood.  . Past pharmacy referral placed on 07/03/20. Patient is having issues with keeping up with refilling her medications. She is wondering if Pharmacy can assist her with adjusting her medications to 90 day refills instead of 30 day. She reports that some of her medications have no refills. She reports ongoing stress with medication management and is wondering if there is an easier way to manage all of her medications.  . Assisted patient/caregiver with obtaining information about health plan benefits . Referred patient to [facility] and [facility] for long term follow up and therapy/counseling but patient declined. She reports that she rather have close friends that she uses for feedback and emotional support instead of going to a therapist.  Patient reports that her recent increase in her antidepressant is effectively working for her. Patient denied needing further mental health support again on  10/07/20 . Solution-Focused Strategies implemented during session today.  Patient Self Care Activities:  . Calls provider office for new concerns or questions . Ability for insight . Independent living . Motivation for treatment  Patient Coping Strengths:  . Supportive Relationships . Hopefulness . Able to Communicate Effectively  Patient Self Care Deficits:  . Unable to independently keep up with medication refills . Lacks social connections   Task: Identify Anxiety Symptoms and Facilitate Treatment  Patient Care Plan: RNCM: Depression (Adult) and anxiety    Problem Identified: RNCM: Depression Identification (Depression) and Anxiety   Priority: Medium    Long-Range Goal: RNCM: Depressive Symptoms Identified/Anxiety triggers   Priority: Medium  Note:   Current Barriers:  [name] Knowledge Deficits related to resources available for effective management of depression and anxiety  . Chronic Disease  Management support and education needs related to depression and anxiety  . Lacks caregiver support.  . Limited mobility- CP . Lacks social connections . Unable to perform ADLs independently . Unable to perform IADLs independently . Does not contact provider office for questions/concerns  Nurse Case Manager Clinical Goal(s):  . patient will verbalize understanding of plan for effective management of anxiety and depression  . patient will work with [place], [place] team, and pcp  to address needs related to effective management of depression and anxiety   Interventions:  . 1:1 collaboration with Charlynne Cousins, MD regarding development and update of comprehensive plan of care as evidenced by provider attestation and co-signature . Inter-disciplinary care team collaboration (see longitudinal plan of care) . Evaluation of current treatment plan related to depression and anxiety  and patient's adherence to plan as established by provider. . Advised patient to call the office for changes in  condition/ changes in mood/increased anxiety or depression . Provided education to patient re: alternative methods to managing depression and anxiety. Cutting back on smoking. The patient is not ready to change her smoking habits. She smokes as a coping mechanism.  She also watches a lot of TV. [name] Reviewed scheduled/upcoming provider appointments including: Does not have an upcoming appointment but plans to call the office next week to get an appointment with pcp for follow up.  . Discussed plans with patient for ongoing care management follow up and provided patient with direct contact information for care management team  Patient Goals/Self-Care Activities Over the next 120 days, patient will:  - Patient will self administer medications as prescribed Patient will attend all scheduled provider appointments Patient will call pharmacy for medication refills Patient will attend church or other social activities Patient will call provider office for new concerns or questions Patient will work with BSW to address care coordination needs and will continue to work with the clinical team to address health care and disease management related needs.   - anxiety screen reviewed - depression screen reviewed - medication list reviewed  Follow Up Plan: Telephone follow up appointment with care management team member scheduled for:03-05-2021 at 1 pm        Task: RNCM: Identify Depressive Symptoms and Facilitate Treatment   Note:   Care Management Activities:    - anxiety screen reviewed - depression screen reviewed - medication list reviewed       Patient Care Plan: RNCM: HLD Management    Problem Identified: RNCM: HLD Management   Priority: Medium    Long-Range Goal: RNCM: HLD Management   Priority: Medium  Note:   Current Barriers:  . Poorly controlled hyperlipidemia, complicated by smoker, limited mobility, poor dietary choices  . Current antihyperlipidemic regimen: atorvastatin 40 mg  QD . Most recent lipid panel:     Component Value Date/Time   CHOL 170 10/04/2020 1103   CHOL 231 (H) 08/16/2018 1049   TRIG 110 10/04/2020 1103   TRIG 266 (H) 08/16/2018 1049   HDL 48 10/04/2020 1103   CHOLHDL 4.2 12/28/2016 1406   VLDL 53 (H) 08/16/2018 1049   LDLCALC 102 (H) 10/04/2020 1103 .   [name] ASCVD risk enhancing conditions: age 11, current smoker . Lacks social connections . Does not contact provider office for questions/concerns  RN Care Manager Clinical Goal(s):  [name] Over the next 120 days, patient will work with Consulting civil engineer, providers, and care team towards execution of optimized self-health management plan . patient will verbalize understanding of plan for effective  management of HLD  . patient will work with [facility] and pcp  to address needs related to HLD   Interventions: . Collaboration with Charlynne Cousins, MD regarding development and update of comprehensive plan of care as evidenced by provider attestation and co-signature . Inter-disciplinary care team collaboration (see longitudinal plan of care) . Medication review performed; medication list updated in electronic medical record.  Bertram Savin care team collaboration (see longitudinal plan of care) . Referred to pharmacy team for assistance with HLD medication management . Evaluation of current treatment plan related to HLD  and patient's adherence to plan as established by provider. . Advised patient to call the office for changes or questions  . Provided education to patient re: heart healthy diet and monitoring for excess sodium and fats. The patient verbalized she is eating better than what she was.  She is cutting back on her unhealthy options and eating more than one time a day. Praised for positive changes  . Reviewed medications with patient and discussed compliance. The patient is taking medications as directed . Discussed plans with patient for ongoing care management follow up and provided patient  with direct contact information for care management team   Patient Goals/Self-Care Activities: . Over the next 120 days, patient will:   - call for medicine refill 2 or 3 days before it runs out - call if I am sick and can't take my medicine - keep a list of all the medicines I take; vitamins and herbals too - learn to read medicine labels - use a pillbox to sort medicine - use an alarm clock or phone to remind me to take my medicine - change to whole grain breads, cereal, pasta - drink 6 to 8 glasses of water each day - eat 3 to 5 servings of fruits and vegetables each day - eat 5 or 6 small meals each day - fill half the plate with nonstarchy vegetables - limit fast food meals to no more than 1 per week - manage portion size - prepare main meal at home 3 to 5 days each week - read food labels for fat, fiber, carbohydrates and portion size - be open to making changes - I can manage, know and watch for signs of a heart attack - if I have chest pain, call for help - learn about small changes that will make a big difference - learn my personal risk factors  - barriers to meeting goals identified - change-talk evoked - choices provided - collaboration with team encouraged - decision-making supported - health risks reviewed - problem-solving facilitated - questions answered - readiness for change evaluated - reassurance provided - self-reflection promoted - self-reliance encouraged  Follow Up Plan: Telephone follow up appointment with care management team member scheduled for:03-05-2021 at 1pm      Task: RNCM: Mutually Develop and Royce Macadamia Achievement of Patient Goals   Note:   Care Management Activities:    - barriers to meeting goals identified - change-talk evoked - choices provided - collaboration with team encouraged - decision-making supported - health risks reviewed - problem-solving facilitated - questions answered - readiness for change evaluated - reassurance  provided - self-reflection promoted - self-reliance encouraged       The patient verbalized understanding of instructions, educational materials, and care plan provided today and declined offer to receive copy of patient instructions, educational materials, and care plan.   Telephone follow up appointment with care management team member scheduled for: 03-05-2021 at 1 pm  Pam Tate RN, MSN, CCM Community Care Coordinator [place]  [facility] Mobile: [phone]   

## 2021-01-22 ENCOUNTER — Other Ambulatory Visit: Payer: Self-pay | Admitting: Family Medicine

## 2021-01-22 ENCOUNTER — Telehealth: Payer: Self-pay

## 2021-01-22 NOTE — Progress Notes (Signed)
Patient declined rescheduling

## 2021-01-22 NOTE — Telephone Encounter (Signed)
Notes to clinic: Review for refill Last filled by [name]    Requested Prescriptions  Pending Prescriptions Disp Refills   atorvastatin (LIPITOR) 40 MG tablet [Pharmacy Med Name: ATORVASTATIN 40 MG TABLET] 90 tablet 0    Sig: Take 1 tablet (40 mg total) by mouth daily.      Cardiovascular:  Antilipid - Statins Failed - 01/22/2021 11:07 AM      Failed - LDL in normal range and within 360 days    LDL Chol Calc (NIH)  Date Value Ref Range Status  10/04/2020 102 (H) 0 - 99 mg/dL Final          Passed - Total Cholesterol in normal range and within 360 days    Cholesterol, Total  Date Value Ref Range Status  10/04/2020 170 100 - 199 mg/dL Final   Cholesterol Piccolo, Waived  Date Value Ref Range Status  08/16/2018 231 (H) <200 mg/dL Final    Comment:                            Desirable                <200                         Borderline High      200- 239                         High                     >239           Passed - HDL in normal range and within 360 days    HDL  Date Value Ref Range Status  10/04/2020 48 >39 mg/dL Final          Passed - Triglycerides in normal range and within 360 days    Triglycerides  Date Value Ref Range Status  10/04/2020 110 0 - 149 mg/dL Final   Triglycerides Piccolo,Waived  Date Value Ref Range Status  08/16/2018 266 (H) <150 mg/dL Final    Comment:                            Normal                   <150                         Borderline High     150 - 199                         High                200 - 499                         Very High                >499           Passed - Patient is not pregnant      Passed - Valid encounter within last 12 months    Recent Outpatient Visits           3 months ago Depression, recurrent ([place])  [place], NP   6 months ago Screening for colon cancer   [facility] Eulogio Bear, NP   7 months ago Depression,  recurrent [facility])   [facility] Eulogio Bear, NP   11 months ago Depression, recurrent [facility])   [place] Eulogio Bear, NP   1 year ago Essential hypertension   [place], Megan P, DO       Future Appointments             In 6 months [place], [place]

## 2021-01-22 NOTE — Chronic Care Management (AMB) (Signed)
  Care Management   Note  01/22/2021 Name: Michelle Chandler MRN: [id number] DOB: 01-Dec-1955  Michelle Chandler is a 65 y.o. year old female who is a primary care patient of Vigg, Avanti, MD and is actively engaged with the care management team. I reached out to Michelle Chandler by phone today to assist with re-scheduling a follow up visit with the Licensed Clinical Social Worker  Follow up plan: Patient declines further follow up and engagement with LCSW at this time states that she is doing well. Appropriate care team members and provider have been notified via electronic communication.   Michelle Chandler, [place], [place], [place] [address] Direct Dial: [phone] [email or web address]: [email or web address]

## 2021-01-22 NOTE — Telephone Encounter (Signed)
Patient last seen in November 2021 and upcoming appointment in April 2022

## 2021-01-24 ENCOUNTER — Telehealth: Payer: Self-pay

## 2021-01-24 NOTE — Telephone Encounter (Signed)
Refill request for Hydrochorothiazide 25 mg  QD, qty 90   LOV 10/04/20  Next ov  01/29/21

## 2021-01-29 ENCOUNTER — Encounter: Payer: Self-pay | Admitting: Internal Medicine

## 2021-01-29 ENCOUNTER — Ambulatory Visit (INDEPENDENT_AMBULATORY_CARE_PROVIDER_SITE_OTHER): Payer: Medicare Other | Admitting: Internal Medicine

## 2021-01-29 ENCOUNTER — Other Ambulatory Visit: Payer: Self-pay

## 2021-01-29 DIAGNOSIS — G801 Spastic diplegic cerebral palsy: Secondary | ICD-10-CM

## 2021-01-29 DIAGNOSIS — F339 Major depressive disorder, recurrent, unspecified: Secondary | ICD-10-CM

## 2021-01-29 DIAGNOSIS — I10 Essential (primary) hypertension: Secondary | ICD-10-CM | POA: Diagnosis not present

## 2021-01-29 MED ORDER — FLUOXETINE HCL 20 MG PO CAPS: 20.0000 mg | ORAL_CAPSULE | Freq: Every day | ORAL | 1 refills | Status: DC

## 2021-01-29 MED ORDER — HYDROCHLOROTHIAZIDE 25 MG PO TABS: 25.0000 mg | ORAL_TABLET | Freq: Every day | ORAL | 1 refills | Status: DC

## 2021-01-29 MED ORDER — IBUPROFEN 400 MG PO TABS: 400.0000 mg | ORAL_TABLET | Freq: Three times a day (TID) | ORAL | 0 refills | Status: DC | PRN

## 2021-01-29 NOTE — Progress Notes (Signed)
BP 106/69   Pulse 70   Temp 98.6 F (37 C) (Oral)   Wt 143 lb 6.4 oz (65 kg)   SpO2 97%   BMI 28.01 kg/m    Subjective:    Patient ID: Michelle Chandler, female    DOB: 1956-10-30, 65 y.o.   MRN: [id number]  HPI: Michelle Chandler is a 65 y.o. female  Pt is a follow up has a ho HTN/ GERD/ HLD - on HCTZ for BP and wakes up at night to pee x 2 - 3 times takes this at night. Unsure why  Hypertension This is a chronic (hctz 25 mg daily bp low today ) problem. The problem is uncontrolled. Pertinent negatives include no anxiety, chest pain, headaches, malaise/fatigue, neck pain, palpitations, peripheral edema or shortness of breath. (Takes hctz at night ) There is no history of chronic renal disease.  Hyperlipidemia This is a chronic problem. She has no history of chronic renal disease, diabetes, hypothyroidism or obesity. Pertinent negatives include no chest pain or shortness of breath.  Gastroesophageal Reflux She complains of heartburn. She reports no abdominal pain, no belching, no chest pain, no coughing, no early satiety, no globus sensation, no hoarse voice, no nausea or no wheezing. Pertinent negatives include no fatigue.  Depression        This is a chronic problem.  The current episode started more than 1 year ago.   Associated symptoms include no fatigue, no appetite change and no headaches.   Pertinent negatives include no hypothyroidism and no anxiety.   Chief Complaint  Patient presents with  . Hypertension  . Hyperlipidemia  . Mood  . Ceberal Palsy    Relevant past medical, surgical, family and social history reviewed and updated as indicated. Interim medical history since our last visit reviewed. Allergies and medications reviewed and updated.  Review of Systems  Constitutional: Negative for activity change, appetite change, chills, fatigue, fever and malaise/fatigue.  HENT: Negative for congestion and hoarse voice.   Eyes: Negative for visual disturbance.  Respiratory:  Negative for apnea, cough, chest tightness, shortness of breath and wheezing.   Cardiovascular: Negative for chest pain, palpitations and leg swelling.  Gastrointestinal: Positive for heartburn. Negative for abdominal distention, abdominal pain, diarrhea and nausea.  Endocrine: Negative for cold intolerance, heat intolerance, polydipsia, polyphagia and polyuria.  Genitourinary: Negative for difficulty urinating, frequency, hematuria and urgency.  Musculoskeletal: Negative for neck pain.  Skin: Negative for color change and rash.  Neurological: Negative for dizziness, speech difficulty, weakness, light-headedness, numbness and headaches.  Psychiatric/Behavioral: Positive for depression. Negative for behavioral problems and confusion. The patient is not nervous/anxious.     Per HPI unless specifically indicated above     Objective:    BP 106/69   Pulse 70   Temp 98.6 F (37 C) (Oral)   Wt 143 lb 6.4 oz (65 kg)   SpO2 97%   BMI 28.01 kg/m   Wt Readings from Last 3 Encounters:  01/29/21 143 lb 6.4 oz (65 kg)  10/04/20 142 lb 6.4 oz (64.6 kg)  07/26/20 145 lb (65.8 kg)    Physical Exam Vitals and nursing note reviewed.  Constitutional:      General: She is not in acute distress.    Appearance: Normal appearance. She is not ill-appearing or diaphoretic.  HENT:     Head: Normocephalic and atraumatic.     Right Ear: Tympanic membrane and external ear normal. There is no impacted cerumen.     Left Ear: External  ear normal.     Nose: No congestion or rhinorrhea.     Mouth/Throat:     Pharynx: No oropharyngeal exudate or posterior oropharyngeal erythema.  Eyes:     Conjunctiva/sclera: Conjunctivae normal.     Pupils: Pupils are equal, round, and reactive to light.  Cardiovascular:     Rate and Rhythm: Normal rate and regular rhythm.     Heart sounds: No murmur heard.   Pulmonary:     Effort: No respiratory distress.     Breath sounds: No stridor. No wheezing or rhonchi.   Chest:     Chest wall: No tenderness.  Abdominal:     General: Abdomen is flat. Bowel sounds are normal. There is no distension.     Palpations: Abdomen is soft. There is no mass.     Tenderness: There is no abdominal tenderness. There is no guarding.  Musculoskeletal:        General: No swelling or deformity.     Cervical back: Normal range of motion and neck supple. No rigidity or tenderness.     Right lower leg: No edema.     Left lower leg: No edema.  Skin:    General: Skin is warm and dry.     Coloration: Skin is not jaundiced.     Findings: No erythema.  Neurological:     Mental Status: She is alert.     Motor: Weakness present.     Gait: Gait abnormal.  Psychiatric:        Mood and Affect: Mood normal.        Behavior: Behavior normal.        Thought Content: Thought content normal.        Judgment: Judgment normal.     Results for orders placed or performed in visit on 10/04/20  Lipid Panel w/o Chol/HDL Ratio  Result Value Ref Range   Cholesterol, Total 170 100 - 199 mg/dL   Triglycerides 110 0 - 149 mg/dL   HDL 48 >39 mg/dL   VLDL Cholesterol Cal 20 5 - 40 mg/dL   LDL Chol Calc (NIH) 102 (H) 0 - 99 mg/dL  HgB A1c  Result Value Ref Range   Hgb A1c MFr Bld 6.3 (H) 4.8 - 5.6 %   Est. average glucose Bld gHb Est-mCnc 134 mg/dL      Assessment & Plan:  1.  HTN:  will change HCTZ to the am, pt has had a problem with frequent urinations at night and has been taking HCTZ at night .  Continue current meds.  Medication compliance emphasised. pt advised to keep Bp logs. Pt verbalised understanding of the same. Pt to have a low salt diet . Exercise to reach a goal of at least 150 mins a week.  lifestyle modifications explained and pt understands importance of the above.  2. Depression : is on prozac 30 mg daily.  Increase to prozac to 40 mg daily  Stop ativan  To take 4 pills of the 10's has a 90 days supply per her verbal record.  To call office once done with her  pills at home.    3. HLD  IS ON LIPITOR 40 MG DAILY  recheck FLP, check LFT's work on diet, SE of meds explained to pt. low fat and high fiber diet explained to pt.   4. GERD Stable, is on prilosec for such  patient advised to avoid laying down soon after his meals. He took a 2 hours between dinner and  bedtime. Avoid spicy food and triggers that he knows food wise that worsen his acid reflux. Patient verbalized understanding of the above. Lifestyle modifications as above discussed with patient.  Problem List Items Addressed This Visit      Cardiovascular and Mediastinum   Essential (primary) hypertension     Nervous and Auditory   Cerebral palsy ([place])     Other   Depression, recurrent ([place])       Follow up plan: Return in about 4 weeks (around 02/26/2021).

## 2021-02-20 ENCOUNTER — Other Ambulatory Visit: Payer: Self-pay

## 2021-02-20 DIAGNOSIS — R5383 Other fatigue: Secondary | ICD-10-CM

## 2021-02-20 DIAGNOSIS — I10 Essential (primary) hypertension: Secondary | ICD-10-CM

## 2021-02-20 DIAGNOSIS — E7800 Pure hypercholesterolemia, unspecified: Secondary | ICD-10-CM

## 2021-02-21 ENCOUNTER — Other Ambulatory Visit: Payer: Self-pay

## 2021-02-21 ENCOUNTER — Other Ambulatory Visit: Payer: Medicare Other

## 2021-02-21 DIAGNOSIS — R5383 Other fatigue: Secondary | ICD-10-CM

## 2021-02-21 DIAGNOSIS — I10 Essential (primary) hypertension: Secondary | ICD-10-CM

## 2021-02-21 DIAGNOSIS — E7800 Pure hypercholesterolemia, unspecified: Secondary | ICD-10-CM

## 2021-02-22 LAB — COMPREHENSIVE METABOLIC PANEL
ALT: 16 IU/L (ref 0–32)
AST: 15 IU/L (ref 0–40)
Albumin/Globulin Ratio: 1.6 (ref 1.2–2.2)
Albumin: 4.4 g/dL (ref 3.8–4.8)
Alkaline Phosphatase: 121 IU/L (ref 44–121)
BUN/Creatinine Ratio: 23 (ref 12–28)
BUN: 17 mg/dL (ref 8–27)
Bilirubin Total: 0.3 mg/dL (ref 0.0–1.2)
CO2: 23 mmol/L (ref 20–29)
Calcium: 9.9 mg/dL (ref 8.7–10.3)
Chloride: 98 mmol/L (ref 96–106)
Creatinine, Ser: 0.75 mg/dL (ref 0.57–1.00)
Globulin, Total: 2.7 g/dL (ref 1.5–4.5)
Glucose: 98 mg/dL (ref 65–99)
Potassium: 4.4 mmol/L (ref 3.5–5.2)
Sodium: 137 mmol/L (ref 134–144)
Total Protein: 7.1 g/dL (ref 6.0–8.5)
eGFR: 90 mL/min/{1.73_m2} (ref >59)

## 2021-02-22 LAB — LIPID PANEL
Chol/HDL Ratio: 3.4 ratio (ref 0.0–4.4)
Cholesterol, Total: 189 mg/dL (ref 100–199)
HDL: 56 mg/dL (ref >39)
LDL Chol Calc (NIH): 108 mg/dL — ABNORMAL HIGH (ref 0–99)
Triglycerides: 142 mg/dL (ref 0–149)
VLDL Cholesterol Cal: 25 mg/dL (ref 5–40)

## 2021-02-22 LAB — CBC WITH DIFFERENTIAL/PLATELET
Basophils Absolute: 0.1 10*3/uL (ref 0.0–0.2)
Basos: 1 %
EOS (ABSOLUTE): 0.3 10*3/uL (ref 0.0–0.4)
Eos: 4 %
Hematocrit: 42.8 % (ref 34.0–46.6)
Hemoglobin: 14.7 g/dL (ref 11.1–15.9)
Immature Grans (Abs): 0 10*3/uL (ref 0.0–0.1)
Immature Granulocytes: 0 %
Lymphocytes Absolute: 2 10*3/uL (ref 0.7–3.1)
Lymphs: 27 %
MCH: 32.1 pg (ref 26.6–33.0)
MCHC: 34.3 g/dL (ref 31.5–35.7)
MCV: 93 fL (ref 79–97)
Monocytes Absolute: 0.5 10*3/uL (ref 0.1–0.9)
Monocytes: 7 %
Neutrophils Absolute: 4.6 10*3/uL (ref 1.4–7.0)
Neutrophils: 61 %
Platelets: 359 10*3/uL (ref 150–450)
RBC: 4.58 x10E6/uL (ref 3.77–5.28)
RDW: 12.1 % (ref 11.7–15.4)
WBC: 7.5 10*3/uL (ref 3.4–10.8)

## 2021-02-22 LAB — TSH: TSH: 2.21 u[IU]/mL (ref 0.450–4.500)

## 2021-03-03 ENCOUNTER — Ambulatory Visit: Payer: Medicare Other | Admitting: Internal Medicine

## 2021-03-05 ENCOUNTER — Telehealth: Payer: Medicare Other | Admitting: General Practice

## 2021-03-05 ENCOUNTER — Ambulatory Visit (INDEPENDENT_AMBULATORY_CARE_PROVIDER_SITE_OTHER): Payer: Medicare Other | Admitting: General Practice

## 2021-03-05 DIAGNOSIS — F339 Major depressive disorder, recurrent, unspecified: Secondary | ICD-10-CM

## 2021-03-05 DIAGNOSIS — E7800 Pure hypercholesterolemia, unspecified: Secondary | ICD-10-CM | POA: Diagnosis not present

## 2021-03-05 DIAGNOSIS — F419 Anxiety disorder, unspecified: Secondary | ICD-10-CM

## 2021-03-05 NOTE — Patient Instructions (Signed)
Visit Information  PATIENT GOALS: Goals Addressed            This Visit's Progress   . RNCM: Manage My Emotions       Timeframe:  Long-Range Goal Priority:  Medium Start Date:     01-08-2021                        Expected End Date:     07-05-2022                  Follow Up Date 05-14-2021   - call and visit an old friend - join a support group - laugh; watch a funny movie or comedian - learn and use visualization or guided imagery - perform a random act of kindness - practice relaxation or meditation daily - talk about feelings with a friend, family or spiritual advisor - practice positive thinking and self-talk    Why is this important?    When you are stressed, down or upset, your body reacts too.   For example, your blood pressure may get higher; you may have a headache or stomachache.   When your emotions get the best of you, your body's ability to fight off cold and flu gets weak.   These steps will help you manage your emotions.     Notes: Will be happy when the weather is better and the time changes so she can go outside and sit. 03-05-2021: The patient is doing well and is weaning off of her ativan. She says things are going well currently and the prozac dose is working well. Will continue to monitor.       The patient verbalized understanding of instructions, educational materials, and care plan provided today and declined offer to receive copy of patient instructions, educational materials, and care plan.   Telephone follow up appointment with care management team member scheduled for: 05-14-2021 at 1 pm  Noreene Larsson RN, MSN, [facility] Mobile: [phone]

## 2021-03-05 NOTE — Chronic Care Management (AMB) (Signed)
Chronic Care Management   CCM RN Visit Note  03/05/2021 Name: Michelle Chandler MRN: [id number] DOB: 12-10-1955  Subjective: Michelle Chandler is a 65 y.o. year old female who is a primary care patient of Vigg, Avanti, MD. The care management team was consulted for assistance with disease management and care coordination needs.    Engaged with patient by telephone for follow up visit in response to provider referral for case management and/or care coordination services.   Consent to Services:  The patient was given information about Chronic Care Management services, agreed to services, and gave verbal consent prior to initiation of services.  Please see initial visit note for detailed documentation.   Patient agreed to services and verbal consent obtained.   Assessment: Review of patient past medical history, allergies, medications, health status, including review of consultants reports, laboratory and other test data, was performed as part of comprehensive evaluation and provision of chronic care management services.   SDOH (Social Determinants of Health) assessments and interventions performed:    CCM Care Plan  Allergies  Allergen Reactions  . No Known Allergies     Outpatient Encounter Medications as of 03/05/2021  Medication Sig  . atorvastatin (LIPITOR) 40 MG tablet Take 1 tablet (40 mg total) by mouth daily.  . baclofen (LIORESAL) 10 MG tablet Take 1 tablet (10 mg total) by mouth 2 (two) times daily.  . hydrochlorothiazide (HYDRODIURIL) 25 MG tablet Take 1 tablet (25 mg total) by mouth daily.  [name] ibuprofen (ADVIL) 400 MG tablet Take 1 tablet (400 mg total) by mouth 3 (three) times daily as needed.  . Incontinence Supply Disposable (ENTRUST PLUS DISP UNDERPADS) MISC by Does not apply route.  . Incontinence Supply Disposable (PROTECTIVE UNDERWEAR LARGE) MISC by Does not apply route.  . meloxicam (MOBIC) 15 MG tablet Take 1 tablet (15 mg total) by mouth daily. (Patient taking differently:  Take 15 mg by mouth daily. Taking prn)  . omeprazole (PRILOSEC) 20 MG capsule Take 1 capsule (20 mg total) by mouth daily.   No facility-administered encounter medications on file as of 03/05/2021.    Patient Active Problem List   Diagnosis Date Noted  . Prediabetes 10/07/2020  . Gastroesophageal reflux disease 06/10/2020  . Smoking greater than 40 pack years 06/10/2020  . Cerebral palsy ([place]) 12/27/2015  . Hyperlipidemia 12/27/2015  . Essential (primary) hypertension 12/27/2015  . Depression, recurrent ([place]) 11/20/2015    Conditions to be addressed/monitored:HLD, Anxiety and Depression  Care Plan : RNCM: Depression (Adult) and anxiety  Updates made by Vanita Ingles since 03/05/2021 12:00 AM    Problem: RNCM: Depression Identification (Depression) and Anxiety   Priority: Medium    Long-Range Goal: RNCM: Depressive Symptoms Identified/Anxiety triggers   Priority: Medium  Note:   Current Barriers:  [name] Knowledge Deficits related to resources available for effective management of depression and anxiety  . Chronic Disease Management support and education needs related to depression and anxiety  . Lacks caregiver support.  . Limited mobility- CP . Lacks social connections . Unable to perform ADLs independently . Unable to perform IADLs independently . Does not contact provider office for questions/concerns  Nurse Case Manager Clinical Goal(s):  . patient will verbalize understanding of plan for effective management of anxiety and depression  . patient will work with [facility], [place] team, and pcp  to address needs related to effective management of depression and anxiety   Interventions:  . 1:1 collaboration with Charlynne Cousins, MD regarding development and update of comprehensive plan  of care as evidenced by provider attestation and co-signature . Inter-disciplinary care team collaboration (see longitudinal plan of care) . Evaluation of current treatment plan related to depression and  anxiety  and patient's adherence to plan as established by provider. 03-05-2021: The patient is doing well with weaning off of her ativan and taking her Prozac. She states she is not having any new concerns. Has follow up with pcp on 03-12-2021 for follow up and evaluation.  . Advised patient to call the office for changes in condition/ changes in mood/increased anxiety or depression . Provided education to patient re: alternative methods to managing depression and anxiety. Cutting back on smoking. The patient is not ready to change her smoking habits. She smokes as a coping mechanism.  She also watches a lot of TV. [name] Reviewed scheduled/upcoming provider appointments including: 03-12-2021 at 3 pm . Discussed plans with patient for ongoing care management follow up and provided patient with direct contact information for care management team  Patient Goals/Self-Care Activities Over the next 120 days, patient will:  - Patient will self administer medications as prescribed Patient will attend all scheduled provider appointments Patient will call pharmacy for medication refills Patient will attend church or other social activities Patient will call provider office for new concerns or questions Patient will work with BSW to address care coordination needs and will continue to work with the clinical team to address health care and disease management related needs.   - anxiety screen reviewed - depression screen reviewed - medication list reviewed  Follow Up Plan: Telephone follow up appointment with care management team member scheduled for:05-14-2021 at 1 pm        Care Plan : RNCM: HLD Management  Updates made by Vanita Ingles since 03/05/2021 12:00 AM    Problem: RNCM: HLD Management   Priority: Medium    Long-Range Goal: RNCM: HLD Management   Priority: Medium  Note:   Current Barriers:  . Poorly controlled hyperlipidemia, complicated by smoker, limited mobility, poor dietary choices   . Current antihyperlipidemic regimen: atorvastatin 40 mg QD . Most recent lipid panel:  . Lab Results .  Component . Value . Date .   [name] CHOL . 189 . 02/21/2021 .   [name] CHOL . 170 . 10/04/2020 .   [name] CHOL . 325 (H) . 07/11/2020 .   [name] Lab Results .  Component . Value . Date .   [name] HDL . 56 . 02/21/2021 .   [name] HDL . 48 . 10/04/2020 .   [name] HDL . 44 . 07/11/2020 .   [name] Lab Results .  Component . Value . Date .   [name] [place] . 108 (H) . 02/21/2021 .   [name] [place] . 102 (H) . 10/04/2020 .   [name] [place] . 240 (H) . 07/11/2020 .   [name] Lab Results .  Component . Value . Date .   [name] TRIG . 142 . 02/21/2021 .   [name] TRIG . 110 . 10/04/2020 .   [name] TRIG . 203 (H) . 07/11/2020 .   [name] Lab Results .  Component . Value . Date .   [name] CHOLHDL . 3.4 . 02/21/2021 .   [name] CHOLHDL . 4.2 . 12/28/2016 .   [name] CHOLHDL . 4.0 . 11/20/2015 .   [name] No results found for: LDLDIRECT  . ASCVD risk enhancing conditions: age 48, current smoker . Lacks social connections . Does not contact provider office for questions/concerns  RN Care Manager  Clinical Goal(s):  [name] Over the next 120 days, patient will work with Consulting civil engineer, providers, and care team towards execution of optimized self-health management plan . patient will verbalize understanding of plan for effective management of HLD  . patient will work with [facility] and pcp  to address needs related to HLD   Interventions: . Collaboration with Charlynne Cousins, MD regarding development and update of comprehensive plan of care as evidenced by provider attestation and co-signature . Inter-disciplinary care team collaboration (see longitudinal plan of care) . Medication review performed; medication list updated in electronic medical record.  Bertram Savin care team collaboration (see longitudinal plan of care) . Referred to pharmacy team for assistance with HLD medication management . Evaluation of current treatment plan related to HLD  and patient's adherence to plan as  established by provider. 03-05-2021: The patient is doing well and monitoring diet. Encouraged her also to keep a check on her blood pressures and record.  . Advised patient to call the office for changes or questions  . Provided education to patient re: heart healthy diet and monitoring for excess sodium and fats. The patient verbalized she is eating better than what she was.  She is cutting back on her unhealthy options and eating more than one time a day. Praised for positive changes.  03-05-2021: Review of dietary changes and the patient states she is watching her diet more and eating better.  . Reviewed medications with patient and discussed compliance. The patient is taking medications as directed . Discussed plans with patient for ongoing care management follow up and provided patient with direct contact information for care management team   Patient Goals/Self-Care Activities: . Over the next 120 days, patient will:   - call for medicine refill 2 or 3 days before it runs out - call if I am sick and can't take my medicine - keep a list of all the medicines I take; vitamins and herbals too - learn to read medicine labels - use a pillbox to sort medicine - use an alarm clock or phone to remind me to take my medicine - change to whole grain breads, cereal, pasta - drink 6 to 8 glasses of water each day - eat 3 to 5 servings of fruits and vegetables each day - eat 5 or 6 small meals each day - fill half the plate with nonstarchy vegetables - limit fast food meals to no more than 1 per week - manage portion size - prepare main meal at home 3 to 5 days each week - read food labels for fat, fiber, carbohydrates and portion size - be open to making changes - I can manage, know and watch for signs of a heart attack - if I have chest pain, call for help - learn about small changes that will make a big difference - learn my personal risk factors  - barriers to meeting goals identified -  change-talk evoked - choices provided - collaboration with team encouraged - decision-making supported - health risks reviewed - problem-solving facilitated - questions answered - readiness for change evaluated - reassurance provided - self-reflection promoted - self-reliance encouraged  Follow Up Plan: Telephone follow up appointment with care management team member scheduled for:05-14-2021 at 1pm        Plan:Telephone follow up appointment with care management team member scheduled for:  05-14-2021 at 1 pm  Noreene Larsson RN, MSN, [facility] Mobile: [phone]

## 2021-03-10 ENCOUNTER — Other Ambulatory Visit: Payer: Self-pay | Admitting: Internal Medicine

## 2021-03-10 DIAGNOSIS — G801 Spastic diplegic cerebral palsy: Secondary | ICD-10-CM

## 2021-03-10 NOTE — Telephone Encounter (Signed)
Pt has apt on 03/12/2021 

## 2021-03-10 NOTE — Telephone Encounter (Signed)
Requested medication (s) are due for refill today: no  Requested medication (s) are on the active medication list:  yes   Last refill:  12/05/2020  Future visit scheduled: yes   Notes to clinic:  this refill cannot be delegated    Requested Prescriptions  Pending Prescriptions Disp Refills   baclofen (LIORESAL) 10 MG tablet [Pharmacy Med Name: BACLOFEN 10 MG TABLET] 180 tablet 0    Sig: Take 1 tablet (10 mg total) by mouth 2 (two) times daily.      Not Delegated - Analgesics:  Muscle Relaxants Failed - 03/10/2021  2:45 PM      Failed - This refill cannot be delegated      Passed - Valid encounter within last 6 months    Recent Outpatient Visits           1 month ago Essential (primary) hypertension   [place] Vigg, Avanti, MD   5 months ago Depression, recurrent ([place])   [place] Eulogio Bear, NP   8 months ago Screening for colon cancer   [facility] Eulogio Bear, NP   9 months ago Depression, recurrent [facility])   [place] Eulogio Bear, NP   1 year ago Depression, recurrent ([place])   [place], Jessica A, NP       Future Appointments             In 2 days Vigg, Avanti, MD [facility] [place]   In 4 months  [facility] [place]

## 2021-03-10 NOTE — Telephone Encounter (Signed)
Routing to provider  

## 2021-03-12 ENCOUNTER — Other Ambulatory Visit: Payer: Self-pay

## 2021-03-12 ENCOUNTER — Encounter: Payer: Self-pay | Admitting: Internal Medicine

## 2021-03-12 ENCOUNTER — Ambulatory Visit (INDEPENDENT_AMBULATORY_CARE_PROVIDER_SITE_OTHER): Payer: Medicare Other | Admitting: Internal Medicine

## 2021-03-12 VITALS — BP 139/85 | HR 84 | Temp 98.3°F | Wt 143.6 lb

## 2021-03-12 DIAGNOSIS — Z780 Asymptomatic menopausal state: Secondary | ICD-10-CM

## 2021-03-12 DIAGNOSIS — Z13820 Encounter for screening for osteoporosis: Secondary | ICD-10-CM | POA: Diagnosis not present

## 2021-03-12 DIAGNOSIS — Z9289 Personal history of other medical treatment: Secondary | ICD-10-CM | POA: Diagnosis not present

## 2021-03-12 DIAGNOSIS — Z23 Encounter for immunization: Secondary | ICD-10-CM

## 2021-03-12 NOTE — Progress Notes (Signed)
BP 139/85   Pulse 84   Temp 98.3 F (36.8 C) (Oral)   Wt 143 lb 9.6 oz (65.1 kg)   SpO2 98%   BMI 28.04 kg/m    Subjective:    Patient ID: Michelle Chandler, female    DOB: 02-16-1956, 65 y.o.   MRN: [id number]  HPI: Michelle Chandler is a 65 y.o. female  Pt is here for a follow up  Hypertension This is a chronic problem. The current episode started more than 1 year ago. The problem is uncontrolled. Pertinent negatives include no anxiety, blurred vision, chest pain, headaches, malaise/fatigue, neck pain, orthopnea, palpitations, peripheral edema, PND, shortness of breath or sweats.  Depression        This is a chronic problem.  Associated symptoms include no fatigue, no appetite change and no headaches.   Pertinent negatives include no anxiety.   Chief Complaint  Patient presents with  . Cerebal palsy  . Hypertension  . Depression    Relevant past medical, surgical, family and social history reviewed and updated as indicated. Interim medical history since our last visit reviewed. Allergies and medications reviewed and updated.  Review of Systems  Constitutional: Negative for activity change, appetite change, chills, fatigue, fever and malaise/fatigue.  HENT: Negative for congestion.   Eyes: Negative for blurred vision and visual disturbance.  Respiratory: Negative for apnea, cough, chest tightness, shortness of breath and wheezing.   Cardiovascular: Negative for chest pain, palpitations, orthopnea, leg swelling and PND.  Gastrointestinal: Negative for abdominal distention, abdominal pain, diarrhea and nausea.  Endocrine: Negative for cold intolerance, heat intolerance, polydipsia, polyphagia and polyuria.  Genitourinary: Negative for difficulty urinating, frequency, hematuria and urgency.  Musculoskeletal: Negative for neck pain.  Skin: Negative for color change and rash.  Neurological: Negative for dizziness, speech difficulty, weakness, light-headedness, numbness and headaches.   Psychiatric/Behavioral: Positive for depression. Negative for behavioral problems and confusion. The patient is not nervous/anxious.     Per HPI unless specifically indicated above     Objective:    BP 139/85   Pulse 84   Temp 98.3 F (36.8 C) (Oral)   Wt 143 lb 9.6 oz (65.1 kg)   SpO2 98%   BMI 28.04 kg/m   Wt Readings from Last 3 Encounters:  03/12/21 143 lb 9.6 oz (65.1 kg)  01/29/21 143 lb 6.4 oz (65 kg)  10/04/20 142 lb 6.4 oz (64.6 kg)    Physical Exam Vitals and nursing note reviewed.  Constitutional:      General: She is not in acute distress.    Appearance: Normal appearance. She is not ill-appearing or diaphoretic.  HENT:     Head: Normocephalic and atraumatic.     Right Ear: Tympanic membrane and external ear normal. There is no impacted cerumen.     Left Ear: External ear normal.     Nose: No congestion or rhinorrhea.     Mouth/Throat:     Pharynx: No oropharyngeal exudate or posterior oropharyngeal erythema.  Eyes:     Conjunctiva/sclera: Conjunctivae normal.     Pupils: Pupils are equal, round, and reactive to light.  Cardiovascular:     Rate and Rhythm: Normal rate and regular rhythm.     Heart sounds: No murmur heard. No friction rub. No gallop.   Pulmonary:     Effort: No respiratory distress.     Breath sounds: No stridor. No wheezing or rhonchi.  Chest:     Chest wall: No tenderness.  Abdominal:  General: Abdomen is flat. Bowel sounds are normal. There is no distension.     Palpations: Abdomen is soft. There is no mass.     Tenderness: There is no abdominal tenderness. There is no guarding.  Musculoskeletal:        General: No swelling or deformity.     Cervical back: Normal range of motion and neck supple. No rigidity or tenderness.     Right lower leg: No edema.     Left lower leg: No edema.  Skin:    General: Skin is warm and dry.     Coloration: Skin is not jaundiced.     Findings: No erythema.  Neurological:     Mental Status:  She is alert and oriented to person, place, and time. Mental status is at baseline.  Psychiatric:        Mood and Affect: Mood normal.        Behavior: Behavior normal.        Thought Content: Thought content normal.        Judgment: Judgment normal.     Results for orders placed or performed in visit on 02/21/21  TSH  Result Value Ref Range   TSH 2.210 0.450 - 4.500 uIU/mL  Lipid panel  Result Value Ref Range   Cholesterol, Total 189 100 - 199 mg/dL   Triglycerides 142 0 - 149 mg/dL   HDL 56 >39 mg/dL   VLDL Cholesterol Cal 25 5 - 40 mg/dL   LDL Chol Calc (NIH) 108 (H) 0 - 99 mg/dL   Chol/HDL Ratio 3.4 0.0 - 4.4 ratio  Comprehensive metabolic panel  Result Value Ref Range   Glucose 98 65 - 99 mg/dL   BUN 17 8 - 27 mg/dL   Creatinine, Ser 0.75 0.57 - 1.00 mg/dL   eGFR 90 >59 mL/min/1.73   BUN/Creatinine Ratio 23 12 - 28   Sodium 137 134 - 144 mmol/L   Potassium 4.4 3.5 - 5.2 mmol/L   Chloride 98 96 - 106 mmol/L   CO2 23 20 - 29 mmol/L   Calcium 9.9 8.7 - 10.3 mg/dL   Total Protein 7.1 6.0 - 8.5 g/dL   Albumin 4.4 3.8 - 4.8 g/dL   Globulin, Total 2.7 1.5 - 4.5 g/dL   Albumin/Globulin Ratio 1.6 1.2 - 2.2   Bilirubin Total 0.3 0.0 - 1.2 mg/dL   Alkaline Phosphatase 121 44 - 121 IU/L   AST 15 0 - 40 IU/L   ALT 16 0 - 32 IU/L  CBC with Differential/Platelet  Result Value Ref Range   WBC 7.5 3.4 - 10.8 x10E3/uL   RBC 4.58 3.77 - 5.28 x10E6/uL   Hemoglobin 14.7 11.1 - 15.9 g/dL   Hematocrit 42.8 34.0 - 46.6 %   MCV 93 79 - 97 fL   MCH 32.1 26.6 - 33.0 pg   MCHC 34.3 31.5 - 35.7 g/dL   RDW 12.1 11.7 - 15.4 %   Platelets 359 150 - 450 x10E3/uL   Neutrophils 61 Not Estab. %   Lymphs 27 Not Estab. %   Monocytes 7 Not Estab. %   Eos 4 Not Estab. %   Basos 1 Not Estab. %   Neutrophils Absolute 4.6 1.4 - 7.0 x10E3/uL   Lymphocytes Absolute 2.0 0.7 - 3.1 x10E3/uL   Monocytes Absolute 0.5 0.1 - 0.9 x10E3/uL   EOS (ABSOLUTE) 0.3 0.0 - 0.4 x10E3/uL   Basophils Absolute 0.1  0.0 - 0.2 x10E3/uL   Immature Granulocytes 0 Not Estab. %  Immature Grans (Abs) 0.0 0.0 - 0.1 x10E3/uL        Current Outpatient Medications:  .  atorvastatin (LIPITOR) 40 MG tablet, Take 1 tablet (40 mg total) by mouth daily., Disp: 90 tablet, Rfl: 0 .  baclofen (LIORESAL) 10 MG tablet, Take 1 tablet (10 mg total) by mouth 2 (two) times daily., Disp: 180 tablet, Rfl: 0 .  FLUoxetine (PROZAC) 20 MG capsule, Take 20 mg by mouth daily., Disp: , Rfl:  .  hydrochlorothiazide (HYDRODIURIL) 25 MG tablet, Take 1 tablet (25 mg total) by mouth daily., Disp: 90 tablet, Rfl: 1 .  ibuprofen (ADVIL) 400 MG tablet, Take 1 tablet (400 mg total) by mouth 3 (three) times daily as needed., Disp: 30 tablet, Rfl: 0 .  Incontinence Supply Disposable (ENTRUST PLUS DISP UNDERPADS) MISC, by Does not apply route., Disp: , Rfl:  .  Incontinence Supply Disposable (PROTECTIVE UNDERWEAR LARGE) MISC, by Does not apply route., Disp: , Rfl:  .  meloxicam (MOBIC) 15 MG tablet, Take 1 tablet (15 mg total) by mouth daily. (Patient taking differently: Take 15 mg by mouth daily. Taking prn), Disp: 90 tablet, Rfl: 1 .  omeprazole (PRILOSEC) 20 MG capsule, Take 1 capsule (20 mg total) by mouth daily., Disp: 90 capsule, Rfl: 1    Assessment & Plan:  1. Cerebral palsy Congenital, has an aide and mom who take of her sec to such   2. GERD : patient advised to avoid laying down soon after his meals. He took a 2 hours between dinner and bedtime. Avoid spicy food and triggers that he knows food wise that worsen his acid reflux. Patient verbalized understanding of the above. Lifestyle modifications as above discussed with patient.   3. HTN  Continue current meds.  Medication compliance emphasised. pt advised to keep Bp logs. Pt verbalised understanding of the same. Pt to have a low salt diet . Exercise to reach a goal of at least 150 mins a week.  lifestyle modifications explained and pt understands importance of the  above.      Orders Placed This Encounter  Procedures  . Pneumococcal polysaccharide vaccine 23-valent greater than or equal to 2yo subcutaneous/IM  . Type and screen    Standing Status:   Future    Standing Expiration Date:   04/11/2021    Problem List Items Addressed This Visit   None      Follow up plan: No follow-ups on file.  Health Maintenance  Mammogram: 2021  Paps smear: hysterectomy in 65 y/o DEXA:  Cscope :to have cologaurd  Pneumonia vaccine : today pneumovax

## 2021-03-18 DIAGNOSIS — H2513 Age-related nuclear cataract, bilateral: Secondary | ICD-10-CM | POA: Diagnosis not present

## 2021-03-18 DIAGNOSIS — H353131 Nonexudative age-related macular degeneration, bilateral, early dry stage: Secondary | ICD-10-CM | POA: Diagnosis not present

## 2021-03-25 ENCOUNTER — Telehealth: Payer: Self-pay | Admitting: Pharmacist

## 2021-03-25 NOTE — Chronic Care Management (AMB) (Signed)
    Chronic Care Management Pharmacy Assistant   Name: Michelle Chandler  MRN: [id number] DOB: Jan 03, 1956   Reason for Encounter: Disease State-General     Recent office visits:  03/12/21- [name], MD(PCP)-Follow up 01/29/21- [name], MD(PCP)-Follow up, take HCTZ in morning rather then at night due to frequent urination, increase prozac to 40 mg daily. Return in 4 weeks  Recent consult visits:  None noted  Hospital visits:  None in previous 6 months  Medications: Outpatient Encounter Medications as of 03/25/2021  Medication Sig  . atorvastatin (LIPITOR) 40 MG tablet Take 1 tablet (40 mg total) by mouth daily.  . baclofen (LIORESAL) 10 MG tablet Take 1 tablet (10 mg total) by mouth 2 (two) times daily.  [name] FLUoxetine (PROZAC) 20 MG capsule Take 20 mg by mouth daily.  . hydrochlorothiazide (HYDRODIURIL) 25 MG tablet Take 1 tablet (25 mg total) by mouth daily.  [name] ibuprofen (ADVIL) 400 MG tablet Take 1 tablet (400 mg total) by mouth 3 (three) times daily as needed.  . Incontinence Supply Disposable (ENTRUST PLUS DISP UNDERPADS) MISC by Does not apply route.  . Incontinence Supply Disposable (PROTECTIVE UNDERWEAR LARGE) MISC by Does not apply route.  . meloxicam (MOBIC) 15 MG tablet Take 1 tablet (15 mg total) by mouth daily. (Patient taking differently: Take 15 mg by mouth daily. Taking prn)  . omeprazole (PRILOSEC) 20 MG capsule Take 1 capsule (20 mg total) by mouth daily.   No facility-administered encounter medications on file as of 03/25/2021.    Have you had any problems recently with your health? Patient states she is not having any concerns.  Have you had any problems with your pharmacy? Patient states she does not have any problems with her pharmacy.  What issues or side effects are you having with your medications? Patient states she is not having any side effects that she knows of.  What would you like me to pass along to [facility] for them to help you with?   Patient states there is nothing at this time but will call us if anything changes.  What can we do to take care of you better? Patient states there is nothing at this time.   Star Rating Drugs: Atorvastatin 40 mg last filled 01/22/21 90 DS  [facility] Clinical Pharmacist Assistant [phone]

## 2021-04-02 ENCOUNTER — Other Ambulatory Visit: Payer: Self-pay | Admitting: Internal Medicine

## 2021-04-02 DIAGNOSIS — G801 Spastic diplegic cerebral palsy: Secondary | ICD-10-CM

## 2021-05-01 ENCOUNTER — Telehealth: Payer: Self-pay | Admitting: Internal Medicine

## 2021-05-01 NOTE — Telephone Encounter (Signed)
Miss Michelle Chandler with [facility] [place] [facility] is calling and would like a order for bone density test. Pt is sch also for mammogram on 05-06-2021 . Please fax [phone]

## 2021-05-01 NOTE — Telephone Encounter (Signed)
[facility] is calling again to get an update on order for a bone density test for patient.  Please advise.

## 2021-05-05 ENCOUNTER — Other Ambulatory Visit: Payer: Self-pay

## 2021-05-05 DIAGNOSIS — M810 Age-related osteoporosis without current pathological fracture: Secondary | ICD-10-CM

## 2021-05-05 DIAGNOSIS — Z1231 Encounter for screening mammogram for malignant neoplasm of breast: Secondary | ICD-10-CM

## 2021-05-05 DIAGNOSIS — Z13820 Encounter for screening for osteoporosis: Secondary | ICD-10-CM

## 2021-05-05 NOTE — Telephone Encounter (Signed)
Michelle Chandler is calling [facility] is calling to request an order for a bone denesity test. Pt is scheduled for a mamogram on 05/06/21. Please fax- [phone]

## 2021-05-05 NOTE — Telephone Encounter (Signed)
Orders have been faxed over to [place]

## 2021-05-06 DIAGNOSIS — Z1231 Encounter for screening mammogram for malignant neoplasm of breast: Secondary | ICD-10-CM | POA: Diagnosis not present

## 2021-05-06 DIAGNOSIS — M810 Age-related osteoporosis without current pathological fracture: Secondary | ICD-10-CM | POA: Diagnosis not present

## 2021-05-06 LAB — HM DEXA SCAN

## 2021-05-06 LAB — HM MAMMOGRAPHY

## 2021-05-14 ENCOUNTER — Ambulatory Visit (INDEPENDENT_AMBULATORY_CARE_PROVIDER_SITE_OTHER): Payer: Medicare Other | Admitting: General Practice

## 2021-05-14 ENCOUNTER — Telehealth: Payer: Medicare Other | Admitting: General Practice

## 2021-05-14 DIAGNOSIS — F339 Major depressive disorder, recurrent, unspecified: Secondary | ICD-10-CM | POA: Diagnosis not present

## 2021-05-14 DIAGNOSIS — E785 Hyperlipidemia, unspecified: Secondary | ICD-10-CM

## 2021-05-14 DIAGNOSIS — F419 Anxiety disorder, unspecified: Secondary | ICD-10-CM

## 2021-05-14 DIAGNOSIS — F17210 Nicotine dependence, cigarettes, uncomplicated: Secondary | ICD-10-CM

## 2021-05-14 NOTE — Patient Instructions (Signed)
Visit Information  PATIENT GOALS:  Goals Addressed             This Visit's Progress    RNCM: Manage My Emotions       Timeframe:  Long-Range Goal Priority:  Medium Start Date:     01-08-2021                        Expected End Date:     07-05-2022                  Follow Up Date 05-14-2021   - call and visit an old friend - join a support group - laugh; watch a funny movie or comedian - learn and use visualization or guided imagery - perform a random act of kindness - practice relaxation or meditation daily - talk about feelings with a friend, family or spiritual advisor - practice positive thinking and self-talk    Why is this important?   When you are stressed, down or upset, your body reacts too.  For example, your blood pressure may get higher; you may have a headache or stomachache.  When your emotions get the best of you, your body's ability to fight off cold and flu gets weak.  These steps will help you manage your emotions.     Notes: Will be happy when the weather is better and the time changes so she can go outside and sit. 03-05-2021: The patient is doing well and is weaning off of her ativan. She says things are going well currently and the prozac dose is working well. Will continue to monitor. 05-14-2021: The patient states she feels she is doing well. Denies any issues with acute depression at this time. She does not like the hot weather because it causes issues with her sinuses but she states she is doing well for the most part. Will continue to monitor.       RNCM: Quit Smoking       Smoking cessation discussed- not ready to quit smoking  05-14-2021: The patient continues to smoke and has been smoking >40 years. The patient states that she is not ready to quit smoking at this time. Knows resources are available if she wants information on smoking cessation.          The patient verbalized understanding of instructions, educational materials, and care plan  provided today and declined offer to receive copy of patient instructions, educational materials, and care plan.   Telephone follow up appointment with care management team member scheduled for: 07-16-2021 at 1 pm  Noreene Larsson RN, MSN, [facility] Mobile: [phone]

## 2021-05-14 NOTE — Chronic Care Management (AMB) (Signed)
Chronic Care Management   CCM RN Visit Note  05/14/2021 Name: Michelle Chandler MRN: [id number] DOB: [birth year]  Subjective: Michelle Chandler is a 65 y.o. year old female who is a primary care patient of Vigg, Avanti, MD. The care management team was consulted for assistance with disease management and care coordination needs.    Engaged with patient by telephone for follow up visit in response to provider referral for case management and/or care coordination services.   Consent to Services:  The patient was given information about Chronic Care Management services, agreed to services, and gave verbal consent prior to initiation of services.  Please see initial visit note for detailed documentation.   Patient agreed to services and verbal consent obtained.   Assessment: Review of patient past medical history, allergies, medications, health status, including review of consultants reports, laboratory and other test data, was performed as part of comprehensive evaluation and provision of chronic care management services.   SDOH (Social Determinants of Health) assessments and interventions performed:    CCM Care Plan  Allergies  Allergen Reactions   No Known Allergies     Outpatient Encounter Medications as of 05/14/2021  Medication Sig   atorvastatin (LIPITOR) 40 MG tablet Take 1 tablet (40 mg total) by mouth daily.   baclofen (LIORESAL) 10 MG tablet Take 1 tablet (10 mg total) by mouth 2 (two) times daily.   FLUoxetine (PROZAC) 20 MG capsule Take 20 mg by mouth daily.   hydrochlorothiazide (HYDRODIURIL) 25 MG tablet Take 1 tablet (25 mg total) by mouth daily.   ibuprofen (ADVIL) 400 MG tablet Take 1 tablet (400 mg total) by mouth 3 (three) times daily as needed.   Incontinence Supply Disposable (ENTRUST PLUS DISP UNDERPADS) MISC by Does not apply route.   Incontinence Supply Disposable (PROTECTIVE UNDERWEAR LARGE) MISC by Does not apply route.   meloxicam (MOBIC) 15 MG tablet Take 1 tablet (15  mg total) by mouth daily. (Patient taking differently: Take 15 mg by mouth daily. Taking prn)   omeprazole (PRILOSEC) 20 MG capsule Take 1 capsule (20 mg total) by mouth daily.   No facility-administered encounter medications on file as of 05/14/2021.    Patient Active Problem List   Diagnosis Date Noted   Prediabetes 10/07/2020   Gastroesophageal reflux disease 06/10/2020   Smoking greater than 40 pack years 06/10/2020   Cerebral palsy ([place]) 12/27/2015   Hyperlipidemia 12/27/2015   Essential (primary) hypertension 12/27/2015   Depression, recurrent ([place]) 11/20/2015    Conditions to be addressed/monitored:HLD, Anxiety, Depression, and smoker- not ready to quit  Care Plan : RNCM: Depression (Adult) and anxiety  Updates made by Vanita Ingles since 05/14/2021 12:00 AM     Problem: RNCM: Depression Identification (Depression) and Anxiety   Priority: Medium     Long-Range Goal: RNCM: Depressive Symptoms Identified/Anxiety triggers   Start Date: 01/08/2021  Expected End Date: 03/30/2022  This Visit's Progress: On track  Priority: High  Note:   Current Barriers:  Knowledge Deficits related to resources available for effective management of depression and anxiety  Chronic Disease Management support and education needs related to depression and anxiety  Lacks caregiver support.  Limited mobility- CP Lacks social connections Unable to perform ADLs independently Unable to perform IADLs independently Does not contact provider office for questions/concerns  Nurse Case Manager Clinical Goal(s):  patient will verbalize understanding of plan for effective management of anxiety and depression  patient will work with [facility], [place] team, and pcp  to address needs related  to effective management of depression and anxiety   Interventions:  1:1 collaboration with Charlynne Cousins, MD regarding development and update of comprehensive plan of care as evidenced by provider attestation and  co-signature Inter-disciplinary care team collaboration (see longitudinal plan of care) Evaluation of current treatment plan related to depression and anxiety  and patient's adherence to plan as established by provider. 03-05-2021: The patient is doing well with weaning off of her ativan and taking her Prozac. She states she is not having any new concerns. Has follow up with pcp on 03-12-2021 for follow up and evaluation. 05-14-2021: The patient feels like she is stable at this time. She does not like the hot weather due to it messing with her sinuses and causing issues but other than that she feels she is doing well. Denies any acute distress. Will continue to monitor. Knows to call the [facility] for changes in condition.  Advised patient to call the office for changes in condition/ changes in mood/increased anxiety or depression Provided education to patient re: alternative methods to managing depression and anxiety. Cutting back on smoking. The patient is not ready to change her smoking habits. She smokes as a coping mechanism.  She also watches a lot of TV. Reviewed scheduled/upcoming provider appointments including: 06-11-2021 Discussed plans with patient for ongoing care management follow up and provided patient with direct contact information for care management team  Patient Goals/Self-Care Activities Over the next 120 days, patient will:  - Patient will self administer medications as prescribed Patient will attend all scheduled provider appointments Patient will call pharmacy for medication refills Patient will attend church or other social activities Patient will call provider office for new concerns or questions Patient will work with BSW to address care coordination needs and will continue to work with the clinical team to address health care and disease management related needs.   - anxiety screen reviewed - depression screen reviewed - medication list reviewed  Follow Up Plan: Telephone  follow up appointment with care management team member scheduled for:07-16-2021 at 1 pm         Task: RNCM: Identify Depressive Symptoms and Facilitate Treatment Completed 05/14/2021  Outcome: Positive  Note:   Care Management Activities:    - anxiety screen reviewed - depression screen reviewed - medication list reviewed        Care Plan : RNCM: HLD Management  Updates made by Vanita Ingles since 05/14/2021 12:00 AM     Problem: RNCM: HLD Management   Priority: Medium     Long-Range Goal: RNCM: HLD Management   Start Date: 01/08/2021  Expected End Date: 02/28/2022  This Visit's Progress: On track  Priority: High  Note:   Current Barriers:  Poorly controlled hyperlipidemia, complicated by smoker, limited mobility, poor dietary choices  Current antihyperlipidemic regimen: atorvastatin 40 mg QD Most recent lipid panel:  Lab Results  Component Value Date   CHOL 189 02/21/2021   CHOL 170 10/04/2020   CHOL 325 (H) 07/11/2020   Lab Results  Component Value Date   HDL 56 02/21/2021   HDL 48 10/04/2020   HDL 44 07/11/2020   Lab Results  Component Value Date   LDLCALC 108 (H) 02/21/2021   LDLCALC 102 (H) 10/04/2020   LDLCALC 240 (H) 07/11/2020   Lab Results  Component Value Date   TRIG 142 02/21/2021   TRIG 110 10/04/2020   TRIG 203 (H) 07/11/2020   Lab Results  Component Value Date   CHOLHDL 3.4 02/21/2021  CHOLHDL 4.2 12/28/2016   CHOLHDL 4.0 11/20/2015   No results found for: LDLDIRECT  ASCVD risk enhancing conditions: age 53, current smoker Lacks social connections Does not contact provider office for questions/concerns  RN Care Manager Clinical Goal(s):  Over the next 120 days, patient will work with Consulting civil engineer, providers, and care team towards execution of optimized self-health management plan patient will verbalize understanding of plan for effective management of HLD  patient will work with [place] and pcp  to address needs related to HLD    Interventions: Collaboration with Vigg, Avanti, MD regarding development and update of comprehensive plan of care as evidenced by provider attestation and co-signature Inter-disciplinary care team collaboration (see longitudinal plan of care) Medication review performed; medication list updated in electronic medical record.  Inter-disciplinary care team collaboration (see longitudinal plan of care) Referred to pharmacy team for assistance with HLD medication management Evaluation of current treatment plan related to HLD  and patient's adherence to plan as established by provider. 05-14-2021: The patient is doing well and monitoring diet. Encouraged her also to keep a check on her blood pressures and record. The patient states that her blood pressures are normal but could not give any numbers. She states she is actually eating better than she has been. She states that she likes fresh vegetables from the garden when she can get them.  Advised patient to call the office for changes or questions  Provided education to patient re: heart healthy diet and monitoring for excess sodium and fats. The patient verbalized she is eating better than what she was.  She is cutting back on her unhealthy options and eating more than one time a day. Praised for positive changes.  05-14-2021: Review of dietary changes and the patient states she is watching her diet more and eating better.  Reviewed medications with patient and discussed compliance. 05-14-2021: The patient is taking medications as directed Discussed plans with patient for ongoing care management follow up and provided patient with direct contact information for care management team   Patient Goals/Self-Care Activities: Over the next 120 days, patient will:   - call for medicine refill 2 or 3 days before it runs out - call if I am sick and can't take my medicine - keep a list of all the medicines I take; vitamins and herbals too - learn to read medicine  labels - use a pillbox to sort medicine - use an alarm clock or phone to remind me to take my medicine - change to whole grain breads, cereal, pasta - drink 6 to 8 glasses of water each day - eat 3 to 5 servings of fruits and vegetables each day - eat 5 or 6 small meals each day - fill half the plate with nonstarchy vegetables - limit fast food meals to no more than 1 per week - manage portion size - prepare main meal at home 3 to 5 days each week - read food labels for fat, fiber, carbohydrates and portion size - be open to making changes - I can manage, know and watch for signs of a heart attack - if I have chest pain, call for help - learn about small changes that will make a big difference - learn my personal risk factors  - barriers to meeting goals identified - change-talk evoked - choices provided - collaboration with team encouraged - decision-making supported - health risks reviewed - problem-solving facilitated - questions answered - readiness for change evaluated - reassurance provided -  self-reflection promoted - self-reliance encouraged  Follow Up Plan: Telephone follow up appointment with care management team member scheduled for:07-16-2021 at 1pm       Task: RNCM: Mutually Develop and Royce Macadamia Achievement of Patient Goals Completed 05/14/2021  Outcome: Positive  Note:   Care Management Activities:    - barriers to meeting goals identified - change-talk evoked - choices provided - collaboration with team encouraged - decision-making supported - health risks reviewed - problem-solving facilitated - questions answered - readiness for change evaluated - reassurance provided - self-reflection promoted - self-reliance encouraged       Plan:Telephone follow up appointment with care management team member scheduled for:  07-16-2021 at 1 pm  [place], MSN, [facility] Mobile: [phone]

## 2021-05-21 ENCOUNTER — Other Ambulatory Visit: Payer: Self-pay | Admitting: Internal Medicine

## 2021-05-21 DIAGNOSIS — G801 Spastic diplegic cerebral palsy: Secondary | ICD-10-CM

## 2021-05-21 NOTE — Telephone Encounter (Signed)
Requested medication (s) are due for refill today:  yes   Requested medication (s) are on the active medication list: yes   Last refill:  03/11/2021  Future visit scheduled: yes  Notes to clinic: This refill cannot be delegated   Requested Prescriptions  Pending Prescriptions Disp Refills   baclofen (LIORESAL) 10 MG tablet [Pharmacy Med Name: BACLOFEN 10 MG TABLET] 180 tablet 0    Sig: Take 1 tablet (10 mg total) by mouth 2 (two) times daily.      Not Delegated - Analgesics:  Muscle Relaxants Failed - 05/21/2021 11:19 AM      Failed - This refill cannot be delegated      Passed - Valid encounter within last 6 months    Recent Outpatient Visits           2 months ago H/O transfusion   [place], MD   3 months ago Essential (primary) hypertension   [place] Vigg, Avanti, MD   7 months ago Depression, recurrent ([place])   [place] Eulogio Bear, NP   10 months ago Screening for colon cancer   [facility] Eulogio Bear, NP   11 months ago Depression, recurrent [facility])   [place] Eulogio Bear, NP       Future Appointments             In 3 weeks Vigg, Avanti, MD [facility]   In 2 months  [facility], [place]

## 2021-05-22 ENCOUNTER — Other Ambulatory Visit: Payer: Self-pay | Admitting: Internal Medicine

## 2021-05-22 NOTE — Telephone Encounter (Signed)
Scheduled 7/27

## 2021-06-11 ENCOUNTER — Encounter: Payer: Self-pay | Admitting: Internal Medicine

## 2021-06-11 ENCOUNTER — Other Ambulatory Visit: Payer: Self-pay

## 2021-06-11 ENCOUNTER — Telehealth: Payer: Self-pay

## 2021-06-11 ENCOUNTER — Ambulatory Visit (INDEPENDENT_AMBULATORY_CARE_PROVIDER_SITE_OTHER): Payer: Medicare Other | Admitting: Internal Medicine

## 2021-06-11 VITALS — BP 125/80 | HR 76 | Temp 99.2°F | Ht 59.84 in

## 2021-06-11 DIAGNOSIS — F419 Anxiety disorder, unspecified: Secondary | ICD-10-CM | POA: Diagnosis not present

## 2021-06-11 DIAGNOSIS — G801 Spastic diplegic cerebral palsy: Secondary | ICD-10-CM | POA: Diagnosis not present

## 2021-06-11 DIAGNOSIS — R7303 Prediabetes: Secondary | ICD-10-CM | POA: Diagnosis not present

## 2021-06-11 DIAGNOSIS — G809 Cerebral palsy, unspecified: Secondary | ICD-10-CM | POA: Diagnosis not present

## 2021-06-11 DIAGNOSIS — I10 Essential (primary) hypertension: Secondary | ICD-10-CM | POA: Diagnosis not present

## 2021-06-11 DIAGNOSIS — M810 Age-related osteoporosis without current pathological fracture: Secondary | ICD-10-CM

## 2021-06-11 LAB — MICROSCOPIC EXAMINATION

## 2021-06-11 LAB — URINALYSIS, ROUTINE W REFLEX MICROSCOPIC
Bilirubin, UA: NEGATIVE
Glucose, UA: NEGATIVE
Ketones, UA: NEGATIVE
Nitrite, UA: POSITIVE — AB
Protein,UA: NEGATIVE
Specific Gravity, UA: 1.02 (ref 1.005–1.030)
Urobilinogen, Ur: 0.2 mg/dL (ref 0.2–1.0)
pH, UA: 7 (ref 5.0–7.5)

## 2021-06-11 LAB — BAYER DCA HB A1C WAIVED: HB A1C (BAYER DCA - WAIVED): 5.8 % (ref <7.0)

## 2021-06-11 MED ORDER — IBANDRONATE SODIUM 150 MG PO TABS: 150.0000 mg | ORAL_TABLET | ORAL | 1 refills | Status: DC

## 2021-06-11 MED ORDER — HYDROXYZINE PAMOATE 25 MG PO CAPS: 25.0000 mg | ORAL_CAPSULE | Freq: Three times a day (TID) | ORAL | 1 refills | Status: DC | PRN

## 2021-06-11 NOTE — Telephone Encounter (Signed)
Copied from [place] [id number]. Topic: General - Call Back - No Documentation >> Jun 11, 2021  3:33 PM Erick Blinks wrote: Reason for CRM: Pt called and reported that she missed a call from the office.  No VM received she says

## 2021-06-11 NOTE — Progress Notes (Signed)
BP 125/80   Pulse 76   Temp 99.2 F (37.3 C) (Oral)   Ht 4' 11.84" (1.52 m)   BMI 28.19 kg/m    Subjective:    Patient ID: Michelle Chandler, female    DOB: 12-Nov-1956, 65 y.o.   MRN: [id number]  Chief Complaint  Patient presents with   Hypertension   Hyperlipidemia   Prediabetes   Cerbral palsy   Depression   Gastroesophageal Reflux    HPI: Michelle Chandler is a 65 y.o. female  History of cerebral palsy requests chair for lifting also request for shoes feet which have been affected due to cerebral palsy patient is prediabetic A1c of 6.3 checked in April last  Hypertension This is a chronic problem. The current episode started more than 1 month ago. The problem has been rapidly improving since onset. The problem is controlled. Pertinent negatives include no anxiety, blurred vision, chest pain, neck pain or orthopnea.  Hyperlipidemia This is a chronic problem. Associated symptoms include a focal sensory loss and a focal weakness. Pertinent negatives include no chest pain.  Depression         Pertinent negatives include no anxiety. Gastroesophageal Reflux She reports no chest pain.  Neurologic Problem The patient's primary symptoms include focal sensory loss and focal weakness. The patient's pertinent negatives include no altered mental status or clumsiness. Pertinent negatives include no chest pain or neck pain.   Chief Complaint  Patient presents with   Hypertension   Hyperlipidemia   Prediabetes   Cerbral palsy   Depression   Gastroesophageal Reflux    Relevant past medical, surgical, family and social history reviewed and updated as indicated. Interim medical history since our last visit reviewed. Allergies and medications reviewed and updated.  Review of Systems  Eyes:  Negative for blurred vision.  Cardiovascular:  Negative for chest pain and orthopnea.  Musculoskeletal:  Negative for neck pain.  Neurological:  Positive for focal weakness.  Psychiatric/Behavioral:   Positive for depression.    Per HPI unless specifically indicated above     Objective:    BP 125/80   Pulse 76   Temp 99.2 F (37.3 C) (Oral)   Ht 4' 11.84" (1.52 m)   BMI 28.19 kg/m   Wt Readings from Last 3 Encounters:  03/12/21 143 lb 9.6 oz (65.1 kg)  01/29/21 143 lb 6.4 oz (65 kg)  10/04/20 142 lb 6.4 oz (64.6 kg)    Physical Exam Vitals and nursing note reviewed.  Constitutional:      General: She is not in acute distress.    Appearance: Normal appearance. She is not ill-appearing or diaphoretic.  HENT:     Head: Normocephalic and atraumatic.     Right Ear: Tympanic membrane and external ear normal. There is no impacted cerumen.     Left Ear: External ear normal.     Nose: No congestion or rhinorrhea.     Mouth/Throat:     Pharynx: No oropharyngeal exudate or posterior oropharyngeal erythema.  Eyes:     Conjunctiva/sclera: Conjunctivae normal.     Pupils: Pupils are equal, round, and reactive to light.  Cardiovascular:     Rate and Rhythm: Normal rate and regular rhythm.     Heart sounds: No murmur heard.   No friction rub. No gallop.  Pulmonary:     Effort: No respiratory distress.     Breath sounds: No stridor. No wheezing or rhonchi.  Chest:     Chest wall: No tenderness.  Abdominal:  General: Abdomen is flat. Bowel sounds are normal. There is no distension.     Palpations: Abdomen is soft. There is no mass.     Tenderness: There is no abdominal tenderness. There is no guarding.  Musculoskeletal:        General: No swelling or deformity.     Cervical back: Normal range of motion and neck supple. No rigidity or tenderness.     Right lower leg: No edema.     Left lower leg: No edema.  Skin:    General: Skin is warm and dry.     Coloration: Skin is not jaundiced.     Findings: No erythema.  Neurological:     Mental Status: She is alert and oriented to person, place, and time. Mental status is at baseline.     Sensory: Sensory deficit present.      Coordination: Coordination abnormal.     Gait: Gait abnormal.  Psychiatric:        Mood and Affect: Mood normal.        Behavior: Behavior normal.        Thought Content: Thought content normal.        Judgment: Judgment normal.   Results for orders placed or performed in visit on 06/11/21  Microscopic Examination   BLD  Result Value Ref Range   WBC, UA 0-5 0 - 5 /hpf   RBC 0-2 0 - 2 /hpf   Epithelial Cells (non renal) 0-10 0 - 10 /hpf   Bacteria, UA Many (A) None seen/Few  Bayer DCA Hb A1c Waived  Result Value Ref Range   HB A1C (BAYER DCA - WAIVED) 5.8 <7.0 %  Urinalysis, Routine w reflex microscopic  Result Value Ref Range   Specific Gravity, UA 1.020 1.005 - 1.030   pH, UA 7.0 5.0 - 7.5   Color, UA Yellow Yellow   Appearance Ur Cloudy (A) Clear   Leukocytes,UA Trace (A) Negative   Protein,UA Negative Negative/Trace   Glucose, UA Negative Negative   Ketones, UA Negative Negative   RBC, UA Trace (A) Negative   Bilirubin, UA Negative Negative   Urobilinogen, Ur 0.2 0.2 - 1.0 mg/dL   Nitrite, UA Positive (A) Negative   Microscopic Examination See below:   Comprehensive metabolic panel  Result Value Ref Range   Glucose 94 65 - 99 mg/dL   BUN 11 8 - 27 mg/dL   Creatinine, Ser 0.64 0.57 - 1.00 mg/dL   eGFR 99 >59 mL/min/1.73   BUN/Creatinine Ratio 17 12 - 28   Sodium 137 134 - 144 mmol/L   Potassium 3.9 3.5 - 5.2 mmol/L   Chloride 97 96 - 106 mmol/L   CO2 26 20 - 29 mmol/L   Calcium 9.8 8.7 - 10.3 mg/dL   Total Protein 6.9 6.0 - 8.5 g/dL   Albumin 4.1 3.8 - 4.8 g/dL   Globulin, Total 2.8 1.5 - 4.5 g/dL   Albumin/Globulin Ratio 1.5 1.2 - 2.2   Bilirubin Total 0.2 0.0 - 1.2 mg/dL   Alkaline Phosphatase 107 44 - 121 IU/L   AST 15 0 - 40 IU/L   ALT 12 0 - 32 IU/L  CBC with Differential/Platelet  Result Value Ref Range   WBC 9.7 3.4 - 10.8 x10E3/uL   RBC 4.58 3.77 - 5.28 x10E6/uL   Hemoglobin 14.5 11.1 - 15.9 g/dL   Hematocrit 43.5 34.0 - 46.6 %   MCV 95 79 - 97 fL    MCH 31.7 26.6 - 33.0  pg   MCHC 33.3 31.5 - 35.7 g/dL   RDW 12.1 11.7 - 15.4 %   Platelets 414 150 - 450 x10E3/uL   Neutrophils 66 Not Estab. %   Lymphs 24 Not Estab. %   Monocytes 7 Not Estab. %   Eos 2 Not Estab. %   Basos 1 Not Estab. %   Neutrophils Absolute 6.4 1.4 - 7.0 x10E3/uL   Lymphocytes Absolute 2.3 0.7 - 3.1 x10E3/uL   Monocytes Absolute 0.7 0.1 - 0.9 x10E3/uL   EOS (ABSOLUTE) 0.2 0.0 - 0.4 x10E3/uL   Basophils Absolute 0.1 0.0 - 0.2 x10E3/uL   Immature Granulocytes 0 Not Estab. %   Immature Grans (Abs) 0.0 0.0 - 0.1 x10E3/uL        Current Outpatient Medications:    atorvastatin (LIPITOR) 40 MG tablet, Take 1 tablet (40 mg total) by mouth daily., Disp: 90 tablet, Rfl: 0   baclofen (LIORESAL) 10 MG tablet, Take 1 tablet (10 mg total) by mouth 2 (two) times daily., Disp: 180 tablet, Rfl: 0   FLUoxetine (PROZAC) 20 MG capsule, Take 20 mg by mouth daily., Disp: , Rfl:    hydrochlorothiazide (HYDRODIURIL) 25 MG tablet, Take 1 tablet (25 mg total) by mouth daily., Disp: 90 tablet, Rfl: 1   hydrOXYzine (VISTARIL) 25 MG capsule, Take 1 capsule (25 mg total) by mouth every 8 (eight) hours as needed., Disp: 30 capsule, Rfl: 1   ibandronate (BONIVA) 150 MG tablet, Take 1 tablet (150 mg total) by mouth every 30 (thirty) days. Take in the morning with a full glass of water, on an empty stomach, and do not take anything else by mouth or lie down for the next 30 min., Disp: 12 tablet, Rfl: 1   ibuprofen (ADVIL) 400 MG tablet, Take 1 tablet (400 mg total) by mouth 3 (three) times daily as needed., Disp: 90 tablet, Rfl: 0   Incontinence Supply Disposable (ENTRUST PLUS DISP UNDERPADS) MISC, by Does not apply route., Disp: , Rfl:    Incontinence Supply Disposable (PROTECTIVE UNDERWEAR LARGE) MISC, by Does not apply route., Disp: , Rfl:    meloxicam (MOBIC) 15 MG tablet, Take 1 tablet (15 mg total) by mouth daily. (Patient taking differently: Take 15 mg by mouth daily. Taking prn), Disp: 90  tablet, Rfl: 1   omeprazole (PRILOSEC) 20 MG capsule, Take 1 capsule (20 mg total) by mouth daily., Disp: 90 capsule, Rfl: 1    Assessment & Plan:  Osteoporosis  OSTEOPOROSIS patient advised to take this once a month on empty stomach needs to sit upright for an hour after  ake this drug by mouth with a full glass of water. Take it as directed on the prescription label on the same day of each month.Take the dose right after waking up. Do not eat or drink anything before taking it. Do not take it with any other drink except water. Do not chew or crush the tablet. After taking it, do not eat breakfast, drink, or take any other drugs or vitamins for at least 30 minutes. Sit or stand up for at least 60 minutes after you take it. Do notlie down. Keep taking it unless your health care provider tells you to stop.   2. Prediabetes Lifestyle modifications advised to pt. A1c at  6.3  Portion control and avoiding high carb low fat diet advised.  Diet plan given to pt   exercise plan given and encouraged.  To increase exercise to 150 mins a week ie 21/2 hours a  week. Pt verbalises understanding of the above.   3. HTN :  Continue current meds.  Medication compliance emphasised. pt advised to keep Bp logs. Pt verbalised understanding of the same. Pt to have a low salt diet . Exercise to reach a goal of at least 150 mins a week.  lifestyle modifications explained and pt understands importance of the above.  4. Needs a rx for a Help chair   5. Cerebral palsy : using baclofen , Needs shoes  - to u with podiatry  Needs handicap sticker  6. Anxiety start pt on hydroxyzine Under good control on current regimen. Continue current regimen. Continue to monitor. Call with any concerns. Refills given.   Problem List Items Addressed This Visit       Nervous and Auditory   Cerebral palsy ([place])   Relevant Orders   Ambulatory referral to Podiatry     Other   Prediabetes - Primary   Relevant Orders   Bayer DCA  Hb A1c Waived (Completed)   Urinalysis, Routine w reflex microscopic (Completed)   Comprehensive metabolic panel (Completed)   CBC with Differential/Platelet (Completed)   Microscopic Examination (Completed)   Anxiety   Relevant Medications   hydrOXYzine (VISTARIL) 25 MG capsule     Orders Placed This Encounter  Procedures   Microscopic Examination   Bayer DCA Hb A1c Waived   Urinalysis, Routine w reflex microscopic   Comprehensive metabolic panel   CBC with Differential/Platelet   Ambulatory referral to Podiatry     Meds ordered this encounter  Medications   ibandronate (BONIVA) 150 MG tablet    Sig: Take 1 tablet (150 mg total) by mouth every 30 (thirty) days. Take in the morning with a full glass of water, on an empty stomach, and do not take anything else by mouth or lie down for the next 30 min.    Dispense:  12 tablet    Refill:  1   hydrOXYzine (VISTARIL) 25 MG capsule    Sig: Take 1 capsule (25 mg total) by mouth every 8 (eight) hours as needed.    Dispense:  30 capsule    Refill:  1     Follow up plan: No follow-ups on file.

## 2021-06-12 DIAGNOSIS — M810 Age-related osteoporosis without current pathological fracture: Secondary | ICD-10-CM | POA: Insufficient documentation

## 2021-06-12 DIAGNOSIS — F419 Anxiety disorder, unspecified: Secondary | ICD-10-CM | POA: Insufficient documentation

## 2021-06-12 LAB — CBC WITH DIFFERENTIAL/PLATELET
Basophils Absolute: 0.1 10*3/uL (ref 0.0–0.2)
Basos: 1 %
EOS (ABSOLUTE): 0.2 10*3/uL (ref 0.0–0.4)
Eos: 2 %
Hematocrit: 43.5 % (ref 34.0–46.6)
Hemoglobin: 14.5 g/dL (ref 11.1–15.9)
Immature Grans (Abs): 0 10*3/uL (ref 0.0–0.1)
Immature Granulocytes: 0 %
Lymphocytes Absolute: 2.3 10*3/uL (ref 0.7–3.1)
Lymphs: 24 %
MCH: 31.7 pg (ref 26.6–33.0)
MCHC: 33.3 g/dL (ref 31.5–35.7)
MCV: 95 fL (ref 79–97)
Monocytes Absolute: 0.7 10*3/uL (ref 0.1–0.9)
Monocytes: 7 %
Neutrophils Absolute: 6.4 10*3/uL (ref 1.4–7.0)
Neutrophils: 66 %
Platelets: 414 10*3/uL (ref 150–450)
RBC: 4.58 x10E6/uL (ref 3.77–5.28)
RDW: 12.1 % (ref 11.7–15.4)
WBC: 9.7 10*3/uL (ref 3.4–10.8)

## 2021-06-12 LAB — COMPREHENSIVE METABOLIC PANEL
ALT: 12 IU/L (ref 0–32)
AST: 15 IU/L (ref 0–40)
Albumin/Globulin Ratio: 1.5 (ref 1.2–2.2)
Albumin: 4.1 g/dL (ref 3.8–4.8)
Alkaline Phosphatase: 107 IU/L (ref 44–121)
BUN/Creatinine Ratio: 17 (ref 12–28)
BUN: 11 mg/dL (ref 8–27)
Bilirubin Total: 0.2 mg/dL (ref 0.0–1.2)
CO2: 26 mmol/L (ref 20–29)
Calcium: 9.8 mg/dL (ref 8.7–10.3)
Chloride: 97 mmol/L (ref 96–106)
Creatinine, Ser: 0.64 mg/dL (ref 0.57–1.00)
Globulin, Total: 2.8 g/dL (ref 1.5–4.5)
Glucose: 94 mg/dL (ref 65–99)
Potassium: 3.9 mmol/L (ref 3.5–5.2)
Sodium: 137 mmol/L (ref 134–144)
Total Protein: 6.9 g/dL (ref 6.0–8.5)
eGFR: 99 mL/min/{1.73_m2} (ref >59)

## 2021-06-20 ENCOUNTER — Telehealth: Payer: Self-pay

## 2021-06-20 NOTE — Chronic Care Management (AMB) (Signed)
    Chronic Care Management Pharmacy Assistant   Name: Michelle Chandler  MRN: [id number] DOB: 1956-06-30  Reason for Encounter: Disease State General    Recent office visits:  06/11/21-Avanti Neomia Dear, MD (PCP) General follow up. Start on Hydroxyzine 25 mg for anxiety. Ambulatory referral to Podiatry for Cerebral palsy. Labs ordered. 03/12/21-[name], MD (PCP) General follow up. Pneumococcal polysaccharide vaccine given. 01/29/21-[name], MD (PCP) General follow up. Increase to prozac to 40 mg daily. Stop ativan. Follow up in 4 weeks. Recent consult visits:  None noted  Hospital visits:  None in previous 6 months  Medications: Outpatient Encounter Medications as of 06/20/2021  Medication Sig   atorvastatin (LIPITOR) 40 MG tablet Take 1 tablet (40 mg total) by mouth daily.   baclofen (LIORESAL) 10 MG tablet Take 1 tablet (10 mg total) by mouth 2 (two) times daily.   FLUoxetine (PROZAC) 20 MG capsule Take 20 mg by mouth daily.   hydrochlorothiazide (HYDRODIURIL) 25 MG tablet Take 1 tablet (25 mg total) by mouth daily.   hydrOXYzine (VISTARIL) 25 MG capsule Take 1 capsule (25 mg total) by mouth every 8 (eight) hours as needed.   ibandronate (BONIVA) 150 MG tablet Take 1 tablet (150 mg total) by mouth every 30 (thirty) days. Take in the morning with a full glass of water, on an empty stomach, and do not take anything else by mouth or lie down for the next 30 min.   ibuprofen (ADVIL) 400 MG tablet Take 1 tablet (400 mg total) by mouth 3 (three) times daily as needed.   Incontinence Supply Disposable (ENTRUST PLUS DISP UNDERPADS) MISC by Does not apply route.   Incontinence Supply Disposable (PROTECTIVE UNDERWEAR LARGE) MISC by Does not apply route.   meloxicam (MOBIC) 15 MG tablet Take 1 tablet (15 mg total) by mouth daily. (Patient taking differently: Take 15 mg by mouth daily. Taking prn)   omeprazole (PRILOSEC) 20 MG capsule Take 1 capsule (20 mg total) by mouth daily.   No  facility-administered encounter medications on file as of 06/20/2021.    Unsuccessfully reached patient 3x to complete general assessment call. I was unable to to leave voicemail's for the patient to return my call.   Star Rating Drugs: Atorvastatin 40 mg Last filled:05/22/21 90 DS  Myriam Elta [place], [place]

## 2021-06-24 ENCOUNTER — Ambulatory Visit: Payer: Medicare Other

## 2021-06-24 ENCOUNTER — Ambulatory Visit (INDEPENDENT_AMBULATORY_CARE_PROVIDER_SITE_OTHER): Payer: Medicare Other | Admitting: Podiatry

## 2021-06-24 ENCOUNTER — Other Ambulatory Visit: Payer: Self-pay

## 2021-06-24 VITALS — BP 123/75 | HR 71 | Temp 97.6°F | Resp 24

## 2021-06-24 DIAGNOSIS — Z9181 History of falling: Secondary | ICD-10-CM | POA: Diagnosis not present

## 2021-06-24 DIAGNOSIS — G801 Spastic diplegic cerebral palsy: Secondary | ICD-10-CM

## 2021-06-24 DIAGNOSIS — M216X1 Other acquired deformities of right foot: Secondary | ICD-10-CM | POA: Diagnosis not present

## 2021-06-24 NOTE — Progress Notes (Addendum)
Subjective:  Patient ID: Michelle Chandler, female    DOB: [birth year],  MRN: [id number]  Chief Complaint  Patient presents with   Callouses    "We want to try to get some comfortable shoes for her."    65 y.o. female presents with the above complaint.  Patient presents with complaint of tightness to the right foot with Adductovarus foot structure.  Patient states that she has cerebral palsy and she was born with it.  She has had multiple surgeries done to both lower extremity.  She wanted to discuss getting some kind of shoes or orthotics for further management.  She is not an ideal candidate for surgery.  She would like to discuss some kind of bracing.  She has not seen anyone else prior to seeing me.  She has had multiple braces done in the past none of them actually gave her relief.  She denies any other acute complaints.  Patient also had a history of multiple falls within the last year.   Review of Systems: Negative except as noted in the HPI. Denies N/V/F/Ch.  Past Medical History:  Diagnosis Date   Advance care planning 06/28/2019   Allergic rhinitis    Ataxia    Bursitis of shoulder 11/20/2015   Overview:  Last Assessment & Plan:  Discussed care and treatment shoulder bursitis with meloxicam   CP (cerebral palsy) (HCC)    Depression    Diverticulitis    Diverticulosis    Fatigue 02/05/2020   Hyperlipidemia    Hypertension    IFG (impaired fasting glucose)    Venous insufficiency    Venous stasis ulcer (HCC)    Vertigo     Current Outpatient Medications:    atorvastatin (LIPITOR) 40 MG tablet, Take 1 tablet (40 mg total) by mouth daily., Disp: 90 tablet, Rfl: 0   baclofen (LIORESAL) 10 MG tablet, Take 1 tablet (10 mg total) by mouth 2 (two) times daily., Disp: 180 tablet, Rfl: 0   FLUoxetine (PROZAC) 20 MG capsule, Take 20 mg by mouth daily., Disp: , Rfl:    hydrochlorothiazide (HYDRODIURIL) 25 MG tablet, Take 1 tablet (25 mg total) by mouth daily., Disp: 90 tablet, Rfl: 1    hydrOXYzine (VISTARIL) 25 MG capsule, Take 1 capsule (25 mg total) by mouth every 8 (eight) hours as needed., Disp: 30 capsule, Rfl: 1   ibandronate (BONIVA) 150 MG tablet, Take 1 tablet (150 mg total) by mouth every 30 (thirty) days. Take in the morning with a full glass of water, on an empty stomach, and do not take anything else by mouth or lie down for the next 30 min., Disp: 12 tablet, Rfl: 1   ibuprofen (ADVIL) 400 MG tablet, Take 1 tablet (400 mg total) by mouth 3 (three) times daily as needed., Disp: 90 tablet, Rfl: 0   Incontinence Supply Disposable (ENTRUST PLUS DISP UNDERPADS) MISC, by Does not apply route., Disp: , Rfl:    Incontinence Supply Disposable (PROTECTIVE UNDERWEAR LARGE) MISC, by Does not apply route., Disp: , Rfl:    meloxicam (MOBIC) 15 MG tablet, Take 1 tablet (15 mg total) by mouth daily. (Patient taking differently: Take 15 mg by mouth daily. Taking prn), Disp: 90 tablet, Rfl: 1   omeprazole (PRILOSEC) 20 MG capsule, Take 1 capsule (20 mg total) by mouth daily., Disp: 90 capsule, Rfl: 1  Social History   Tobacco Use  Smoking Status Every Day   Packs/day: 0.50   Types: Cigarettes  Smokeless Tobacco Never    Allergies  Allergen Reactions   No Known Allergies    Objective:   Vitals:   06/24/21 1059  BP: 123/75  Pulse: 71  Resp: (!) 24  Temp: 97.6 F (36.4 C)   There is no height or weight on file to calculate BMI. Constitutional Well developed. Well nourished.  Vascular Dorsalis pedis pulses palpable bilaterally. Posterior tibial pulses palpable bilaterally. Capillary refill normal to all digits.  No cyanosis or clubbing noted. Pedal hair growth normal.  Neurologic Normal speech. Oriented to person, place, and time. Epicritic sensation to light touch grossly present bilaterally.  Dermatologic Nails well groomed and normal in appearance. No open wounds. No skin lesions.  Orthopedic: No focal point of tenderness or pain noted.  Gait examination  shows cavovarus adductus foot structure with excessive pressure to submit one in segment 3.  Severe contracture of the posterior tibial tendon and Achilles tendon noted leading to the cavus foot structure.  Positive Silfverskiold test with Achilles tendon tightness.   Radiographs: None Assessment:   1. Cavovarus deformity of foot, acquired, right   2. History of fall   3. Spastic diplegic cerebral palsy ([place])    Plan:  Patient was evaluated and treated and all questions answered.  History of fall with a cavovarus foot type secondary to cerebral palsy -I explained the patient the etiology of foot foot therapy and various treatment options were extensively discussed.  Ultimately discussed with her the driving force behind this is cerebral palsy leading to severe cavovarus foot structure.  She is not an ideal surgical candidate as well given the nature of the disease and her progressive and debilitating it is.  At this time I discussed with her that given her history of fall she may benefit from [facility]  balance bracing. -Should be scheduled see EJ for Moore's balance bracing and if any other bracing is beneficial to the patient. -Patient agrees with the plan like to proceed with bracing -The previous brace was lost and therefore I believe she will benefit from new bracing.  No follow-ups on file.

## 2021-06-24 NOTE — Addendum Note (Signed)
Addended by: Lolita Rieger on: 06/24/2021 04:54 PM   Modules accepted: Orders

## 2021-07-09 ENCOUNTER — Ambulatory Visit: Payer: Medicare Other

## 2021-07-16 ENCOUNTER — Telehealth: Payer: Medicare Other | Admitting: General Practice

## 2021-07-16 ENCOUNTER — Ambulatory Visit (INDEPENDENT_AMBULATORY_CARE_PROVIDER_SITE_OTHER): Payer: Medicare Other

## 2021-07-16 DIAGNOSIS — E785 Hyperlipidemia, unspecified: Secondary | ICD-10-CM

## 2021-07-16 DIAGNOSIS — F339 Major depressive disorder, recurrent, unspecified: Secondary | ICD-10-CM | POA: Diagnosis not present

## 2021-07-16 DIAGNOSIS — F419 Anxiety disorder, unspecified: Secondary | ICD-10-CM

## 2021-07-16 NOTE — Patient Instructions (Signed)
Visit Information  PATIENT GOALS:  Goals Addressed             This Visit's Progress    RNCM: Manage My Emotions       Timeframe:  Long-Range Goal Priority:  Medium Start Date:     01-08-2021                        Expected End Date:     07-05-2022                  Follow Up Date 09-17-2021   - call and visit an old friend - join a support group - laugh; watch a funny movie or comedian - learn and use visualization or guided imagery - perform a random act of kindness - practice relaxation or meditation daily - talk about feelings with a friend, family or spiritual advisor - practice positive thinking and self-talk    Why is this important?   When you are stressed, down or upset, your body reacts too.  For example, your blood pressure may get higher; you may have a headache or stomachache.  When your emotions get the best of you, your body's ability to fight off cold and flu gets weak.  These steps will help you manage your emotions.     Notes: Will be happy when the weather is better and the time changes so she can go outside and sit. 03-05-2021: The patient is doing well and is weaning off of her ativan. She says things are going well currently and the prozac dose is working well. Will continue to monitor. 05-14-2021: The patient states she feels she is doing well. Denies any issues with acute depression at this time. She does not like the hot weather because it causes issues with her sinuses but she states she is doing well for the most part. Will continue to monitor. 07-16-2021: The patient is doing well and denies any acute distress. The patient feels she is managing her anxiety and depression well. Is happier about temperature changes and states this weekend is supposed to be cooler weather. Will continue to monitor.         The patient verbalized understanding of instructions, educational materials, and care plan provided today and declined offer to receive copy of patient  instructions, educational materials, and care plan.   Telephone follow up appointment with care management team member scheduled for: 09-17-2021 at 1 pm  Noreene Larsson RN, MSN, [facility] Mobile: [phone]

## 2021-07-16 NOTE — Chronic Care Management (AMB) (Signed)
Chronic Care Management   CCM RN Visit Note  07/16/2021 Name: Michelle Chandler MRN: [id number] DOB: [birth year]  Subjective: Michelle Chandler is a 65 y.o. year old female who is a primary care patient of Vigg, Avanti, MD. The care management team was consulted for assistance with disease management and care coordination needs.    Engaged with patient by telephone for follow up visit in response to provider referral for case management and/or care coordination services.   Consent to Services:  The patient was given information about Chronic Care Management services, agreed to services, and gave verbal consent prior to initiation of services.  Please see initial visit note for detailed documentation.   Patient agreed to services and verbal consent obtained.   Assessment: Review of patient past medical history, allergies, medications, health status, including review of consultants reports, laboratory and other test data, was performed as part of comprehensive evaluation and provision of chronic care management services.   SDOH (Social Determinants of Health) assessments and interventions performed:    CCM Care Plan  Allergies  Allergen Reactions   No Known Allergies     Outpatient Encounter Medications as of 07/16/2021  Medication Sig   atorvastatin (LIPITOR) 40 MG tablet Take 1 tablet (40 mg total) by mouth daily.   baclofen (LIORESAL) 10 MG tablet Take 1 tablet (10 mg total) by mouth 2 (two) times daily.   FLUoxetine (PROZAC) 20 MG capsule Take 20 mg by mouth daily.   hydrochlorothiazide (HYDRODIURIL) 25 MG tablet Take 1 tablet (25 mg total) by mouth daily.   hydrOXYzine (VISTARIL) 25 MG capsule Take 1 capsule (25 mg total) by mouth every 8 (eight) hours as needed.   ibandronate (BONIVA) 150 MG tablet Take 1 tablet (150 mg total) by mouth every 30 (thirty) days. Take in the morning with a full glass of water, on an empty stomach, and do not take anything else by mouth or lie down for the next 30  min.   ibuprofen (ADVIL) 400 MG tablet Take 1 tablet (400 mg total) by mouth 3 (three) times daily as needed.   Incontinence Supply Disposable (ENTRUST PLUS DISP UNDERPADS) MISC by Does not apply route.   Incontinence Supply Disposable (PROTECTIVE UNDERWEAR LARGE) MISC by Does not apply route.   meloxicam (MOBIC) 15 MG tablet Take 1 tablet (15 mg total) by mouth daily. (Patient taking differently: Take 15 mg by mouth daily. Taking prn)   omeprazole (PRILOSEC) 20 MG capsule Take 1 capsule (20 mg total) by mouth daily.   No facility-administered encounter medications on file as of 07/16/2021.    Patient Active Problem List   Diagnosis Date Noted   Anxiety 06/12/2021   Osteoporosis 06/12/2021   Prediabetes 10/07/2020   Gastroesophageal reflux disease 06/10/2020   Smoking greater than 40 pack years 06/10/2020   Cerebral palsy ([place]) 12/27/2015   Hyperlipidemia 12/27/2015   Essential (primary) hypertension 12/27/2015   Depression, recurrent ([place]) 11/20/2015    Conditions to be addressed/monitored:HLD, Anxiety, and Depression  Care Plan : RNCM: Depression (Adult) and anxiety  Updates made by Vanita Ingles, RN since 07/16/2021 12:00 AM     Problem: RNCM: Depression Identification (Depression) and Anxiety   Priority: Medium     Long-Range Goal: RNCM: Depressive Symptoms Identified/Anxiety triggers   Start Date: 01/08/2021  Expected End Date: 03/30/2022  This Visit's Progress: On track  Recent Progress: On track  Priority: High  Note:   Current Barriers:  Knowledge Deficits related to resources available for effective management of  depression and anxiety  Chronic Disease Management support and education needs related to depression and anxiety  Lacks caregiver support.  Limited mobility- CP Lacks social connections Unable to perform ADLs independently Unable to perform IADLs independently Does not contact provider office for questions/concerns  Nurse Case Manager Clinical Goal(s):   patient will verbalize understanding of plan for effective management of anxiety and depression  patient will work with [place], [place] team, and pcp  to address needs related to effective management of depression and anxiety   Interventions:  1:1 collaboration with Charlynne Cousins, MD regarding development and update of comprehensive plan of care as evidenced by provider attestation and co-signature Inter-disciplinary care team collaboration (see longitudinal plan of care) Evaluation of current treatment plan related to depression and anxiety  and patient's adherence to plan as established by provider. 03-05-2021: The patient is doing well with weaning off of her ativan and taking her Prozac. She states she is not having any new concerns. Has follow up with pcp on 03-12-2021 for follow up and evaluation. 05-14-2021: The patient feels like she is stable at this time. She does not like the hot weather due to it messing with her sinuses and causing issues but other than that she feels she is doing well. Denies any acute distress. Will continue to monitor. Knows to call the [facility] for changes in condition. 07-16-2021: The patient is doing well and feels like her anxiety and depression are stable. She is enjoying the cooler temperatures outside and states that it is supposed to be cooler this weekend.  Will continue to monitor for changes.  Advised patient to call the office for changes in condition/ changes in mood/increased anxiety or depression Provided education to patient re: alternative methods to managing depression and anxiety. Cutting back on smoking. The patient is not ready to change her smoking habits. She smokes as a coping mechanism.  She also watches a lot of TV. Reviewed scheduled/upcoming provider appointments including: 09-11-2021 at 1 pm Discussed plans with patient for ongoing care management follow up and provided patient with direct contact information for care management team  Patient  Goals/Self-Care Activities  patient will:  - Patient will self administer medications as prescribed Patient will attend all scheduled provider appointments Patient will call pharmacy for medication refills Patient will attend church or other social activities Patient will call provider office for new concerns or questions Patient will work with BSW to address care coordination needs and will continue to work with the clinical team to address health care and disease management related needs.   - anxiety screen reviewed - depression screen reviewed - medication list reviewed  Follow Up Plan: Telephone follow up appointment with care management team member scheduled for:09-17-2021 at 1 pm         Care Plan : RNCM: HLD Management  Updates made by Vanita Ingles, RN since 07/16/2021 12:00 AM     Problem: RNCM: HLD Management   Priority: Medium     Long-Range Goal: RNCM: HLD Management   Start Date: 01/08/2021  Expected End Date: 02/28/2022  This Visit's Progress: On track  Recent Progress: On track  Priority: High  Note:   Current Barriers:  Poorly controlled hyperlipidemia, complicated by smoker, limited mobility, poor dietary choices  Current antihyperlipidemic regimen: atorvastatin 40 mg QD Most recent lipid panel:  Lab Results  Component Value Date   CHOL 189 02/21/2021   CHOL 170 10/04/2020   CHOL 325 (H) 07/11/2020   Lab Results  Component  Value Date   HDL 56 02/21/2021   HDL 48 10/04/2020   HDL 44 07/11/2020   Lab Results  Component Value Date   LDLCALC 108 (H) 02/21/2021   LDLCALC 102 (H) 10/04/2020   LDLCALC 240 (H) 07/11/2020   Lab Results  Component Value Date   TRIG 142 02/21/2021   TRIG 110 10/04/2020   TRIG 203 (H) 07/11/2020   Lab Results  Component Value Date   CHOLHDL 3.4 02/21/2021   CHOLHDL 4.2 12/28/2016   CHOLHDL 4.0 11/20/2015   No results found for: LDLDIRECT  ASCVD risk enhancing conditions: age 44, current smoker Lacks Automotive engineer Does not contact provider office for Health visitor Clinical Goal(s):   patient will work with Consulting civil engineer, providers, and care team towards execution of optimized self-health management plan patient will verbalize understanding of plan for effective management of HLD  patient will work with [place] and pcp  to address needs related to HLD   Interventions: Collaboration with Vigg, Avanti, MD regarding development and update of comprehensive plan of care as evidenced by provider attestation and co-signature Inter-disciplinary care team collaboration (see longitudinal plan of care) Medication review performed; medication list updated in electronic medical record.  Inter-disciplinary care team collaboration (see longitudinal plan of care) Referred to pharmacy team for assistance with HLD medication management Evaluation of current treatment plan related to HLD  and patient's adherence to plan as established by provider. 07-16-2021: The patient is doing well and monitoring diet. Encouraged her also to keep a check on her blood pressures and record. The patient states that her blood pressures are normal but could not give any numbers. She states she is actually eating better than she has been. She states that she likes fresh vegetables from the garden when she can get them. She denies any issues with dietary restrictions and she states she is resting well at night. Will continue to monitor for changes or new concerns.  Advised patient to call the office for changes or questions  Provided education to patient re: heart healthy diet and monitoring for excess sodium and fats. The patient verbalized she is eating better than what she was.  She is cutting back on her unhealthy options and eating more than one time a day. Praised for positive changes.  07-16-2021: Review of dietary changes and the patient states she is watching her diet more and eating better.  Reviewed  medications with patient and discussed compliance. 07-16-2021: The patient is taking medications as directed Discussed plans with patient for ongoing care management follow up and provided patient with direct contact information for care management team   Patient Goals/Self-Care Activities:  patient will:   - call for medicine refill 2 or 3 days before it runs out - call if I am sick and can't take my medicine - keep a list of all the medicines I take; vitamins and herbals too - learn to read medicine labels - use a pillbox to sort medicine - use an alarm clock or phone to remind me to take my medicine - change to whole grain breads, cereal, pasta - drink 6 to 8 glasses of water each day - eat 3 to 5 servings of fruits and vegetables each day - eat 5 or 6 small meals each day - fill half the plate with nonstarchy vegetables - limit fast food meals to no more than 1 per week - manage portion size - prepare main meal at home 3 to  5 days each week - read food labels for fat, fiber, carbohydrates and portion size - be open to making changes - I can manage, know and watch for signs of a heart attack - if I have chest pain, call for help - learn about small changes that will make a big difference - learn my personal risk factors  - barriers to meeting goals identified - change-talk evoked - choices provided - collaboration with team encouraged - decision-making supported - health risks reviewed - problem-solving facilitated - questions answered - readiness for change evaluated - reassurance provided - self-reflection promoted - self-reliance encouraged  Follow Up Plan: Telephone follow up appointment with care management team member scheduled for:09-17-2021 at 1pm        Plan:Telephone follow up appointment with care management team member scheduled for:  09-17-2021 at 1 pm  [place], MSN, [facility] Mobile: [phone]

## 2021-07-28 ENCOUNTER — Ambulatory Visit (INDEPENDENT_AMBULATORY_CARE_PROVIDER_SITE_OTHER): Payer: Medicare Other

## 2021-07-28 VITALS — Ht 60.0 in | Wt 150.0 lb

## 2021-07-28 DIAGNOSIS — Z0000 Encounter for general adult medical examination without abnormal findings: Secondary | ICD-10-CM | POA: Diagnosis not present

## 2021-07-28 NOTE — Patient Instructions (Signed)
Michelle Chandler , Thank you for taking time to come for your Medicare Wellness Visit. I appreciate your ongoing commitment to your health goals. Please review the following plan we discussed and let me know if I can assist you in the future.   Screening recommendations/referrals: Colonoscopy: due Mammogram: completed 2022 per patient Bone Density: n/a Recommended yearly ophthalmology/optometry visit for glaucoma screening and checkup Recommended yearly dental visit for hygiene and checkup  Vaccinations: Influenza vaccine: due Pneumococcal vaccine: completed 03/12/2021 Tdap vaccine: completed 10/19/2017, due 10/20/2027 Shingles vaccine: completed  Covid-19:  05/22/2020, 05/03/2020  Advanced directives: Advance directive discussed with you today.   Conditions/risks identified: smoking  Next appointment: Follow up in one year for your annual wellness visit.   Preventive Care 65 Years, Female Preventive care refers to lifestyle choices and visits with your health care provider that can promote health and wellness. What does preventive care include? A yearly physical exam. This is also called an annual well check. Dental exams once or twice a year. Routine eye exams. Ask your health care provider how often you should have your eyes checked. Personal lifestyle choices, including: Daily care of your teeth and gums. Regular physical activity. Eating a healthy diet. Avoiding tobacco and drug use. Limiting alcohol use. Practicing safe sex. Taking low-dose aspirin daily starting at age 63. Taking vitamin and mineral supplements as recommended by your health care provider. What happens during an annual well check? The services and screenings done by your health care provider during your annual well check will depend on your age, overall health, lifestyle risk factors, and family history of disease. Counseling  Your health care provider may ask you questions about your: Alcohol use. Tobacco  use. Drug use. Emotional well-being. Home and relationship well-being. Sexual activity. Eating habits. Work and work Statistician. Method of birth control. Menstrual cycle. Pregnancy history. Screening  You may have the following tests or measurements: Height, weight, and BMI. Blood pressure. Lipid and cholesterol levels. These may be checked every 5 years, or more frequently if you are over 30 years old. Skin check. Lung cancer screening. You may have this screening every year starting at age 63 if you have a 30-pack-year history of smoking and currently smoke or have quit within the past 15 years. Fecal occult blood test (FOBT) of the stool. You may have this test every year starting at age 63. Flexible sigmoidoscopy or colonoscopy. You may have a sigmoidoscopy every 5 years or a colonoscopy every 10 years starting at age 63. Hepatitis C blood test. Hepatitis B blood test. Sexually transmitted disease (STD) testing. Diabetes screening. This is done by checking your blood sugar (glucose) after you have not eaten for a while (fasting). You may have this done every 1-3 years. Mammogram. This may be done every 1-2 years. Talk to your health care provider about when you should start having regular mammograms. This may depend on whether you have a family history of breast cancer. BRCA-related cancer screening. This may be done if you have a family history of breast, ovarian, tubal, or peritoneal cancers. Pelvic exam and Pap test. This may be done every 3 years starting at age 63. Starting at age 63, this may be done every 5 years if you have a Pap test in combination with an HPV test. Bone density scan. This is done to screen for osteoporosis. You may have this scan if you are at high risk for osteoporosis. Discuss your test results, treatment options, and if necessary, the need for  more tests with your health care provider. Vaccines  Your health care provider may recommend certain vaccines,  such as: Influenza vaccine. This is recommended every year. Tetanus, diphtheria, and acellular pertussis (Tdap, Td) vaccine. You may need a Td booster every 10 years. Zoster vaccine. You may need this after age 63. Pneumococcal 13-valent conjugate (PCV13) vaccine. You may need this if you have certain conditions and were not previously vaccinated. Pneumococcal polysaccharide (PPSV23) vaccine. You may need one or two doses if you smoke cigarettes or if you have certain conditions. Talk to your health care provider about which screenings and vaccines you need and how often you need them. This information is not intended to replace advice given to you by your health care provider. Make sure you discuss any questions you have with your health care provider. Document Released: 11/29/2015 Document Revised: 07/22/2016 Document Reviewed: 09/03/2015 Elsevier Interactive Patient Education  2017 [employer] Prevention in the Home Falls can cause injuries. They can happen to people of all ages. There are many things you can do to make your home safe and to help prevent falls. What can I do on the outside of my home? Regularly fix the edges of walkways and driveways and fix any cracks. Remove anything that might make you trip as you walk through a door, such as a raised step or threshold. Trim any bushes or trees on the path to your home. Use bright outdoor lighting. Clear any walking paths of anything that might make someone trip, such as rocks or tools. Regularly check to see if handrails are loose or broken. Make sure that both sides of any steps have handrails. Any raised decks and porches should have guardrails on the edges. Have any leaves, snow, or ice cleared regularly. Use sand or salt on walking paths during winter. Clean up any spills in your garage right away. This includes oil or grease spills. What can I do in the bathroom? Use night lights. Install grab bars by the toilet and  in the tub and shower. Do not use towel bars as grab bars. Use non-skid mats or decals in the tub or shower. If you need to sit down in the shower, use a plastic, non-slip stool. Keep the floor dry. Clean up any water that spills on the floor as soon as it happens. Remove soap buildup in the tub or shower regularly. Attach bath mats securely with double-sided non-slip rug tape. Do not have throw rugs and other things on the floor that can make you trip. What can I do in the bedroom? Use night lights. Make sure that you have a light by your bed that is easy to reach. Do not use any sheets or blankets that are too big for your bed. They should not hang down onto the floor. Have a firm chair that has side arms. You can use this for support while you get dressed. Do not have throw rugs and other things on the floor that can make you trip. What can I do in the kitchen? Clean up any spills right away. Avoid walking on wet floors. Keep items that you use a lot in easy-to-reach places. If you need to reach something above you, use a strong step stool that has a grab bar. Keep electrical cords out of the way. Do not use floor polish or wax that makes floors slippery. If you must use wax, use non-skid floor wax. Do not have throw rugs and other things  on the floor that can make you trip. What can I do with my stairs? Do not leave any items on the stairs. Make sure that there are handrails on both sides of the stairs and use them. Fix handrails that are broken or loose. Make sure that handrails are as long as the stairways. Check any carpeting to make sure that it is firmly attached to the stairs. Fix any carpet that is loose or worn. Avoid having throw rugs at the top or bottom of the stairs. If you do have throw rugs, attach them to the floor with carpet tape. Make sure that you have a light switch at the top of the stairs and the bottom of the stairs. If you do not have them, ask someone to add  them for you. What else can I do to help prevent falls? Wear shoes that: Do not have high heels. Have rubber bottoms. Are comfortable and fit you well. Are closed at the toe. Do not wear sandals. If you use a stepladder: Make sure that it is fully opened. Do not climb a closed stepladder. Make sure that both sides of the stepladder are locked into place. Ask someone to hold it for you, if possible. Clearly mark and make sure that you can see: Any grab bars or handrails. First and last steps. Where the edge of each step is. Use tools that help you move around (mobility aids) if they are needed. These include: Canes. Walkers. Scooters. Crutches. Turn on the lights when you go into a dark area. Replace any light bulbs as soon as they burn out. Set up your furniture so you have a clear path. Avoid moving your furniture around. If any of your floors are uneven, fix them. If there are any pets around you, be aware of where they are. Review your medicines with your doctor. Some medicines can make you feel dizzy. This can increase your chance of falling. Ask your doctor what other things that you can do to help prevent falls. This information is not intended to replace advice given to you by your health care provider. Make sure you discuss any questions you have with your health care provider. Document Released: 08/29/2009 Document Revised: 04/09/2016 Document Reviewed: 12/07/2014 Elsevier Interactive Patient Education  2017 [employer].

## 2021-07-28 NOTE — Progress Notes (Signed)
I connected with Michelle Chandler today by telephone and verified that I am speaking with the correct person using two identifiers. Location patient: home Location provider: work Persons participating in the virtual visit: Michelle Chandler, Michelle Chandler.   I discussed the limitations, risks, security and privacy concerns of performing an evaluation and management service by telephone and the availability of in person appointments. I also discussed with the patient that there may be a patient responsible charge related to this service. The patient expressed understanding and verbally consented to this telephonic visit.    Interactive audio and video telecommunications were attempted between this provider and patient, however failed, due to patient having technical difficulties OR patient did not have access to video capability.  We continued and completed visit with audio only.     Vital signs may be patient reported or missing.  Subjective:   Michelle Chandler is a 65 y.o. female who presents for Medicare Annual (Subsequent) preventive examination.  Review of Systems     Cardiac Risk Factors include: dyslipidemia;hypertension;sedentary lifestyle;smoking/ tobacco exposure     Objective:    Today's Vitals   07/28/21 1112  Weight: 150 lb (68 kg)  Height: 5' (1.524 m)   Body mass index is 29.29 kg/m.  Advanced Directives 07/28/2021 07/26/2020 06/21/2019 09/23/2018 09/10/2018 01/28/2018 09/20/2016  Does Patient Have a Medical Advance Directive? No No No No No Yes;No No  Does patient want to make changes to medical advance directive? - - - - - Yes (MAU/Ambulatory/Procedural Areas - Information given) -  Would patient like information on creating a medical advance directive? - - - - No - Patient declined - No - patient declined information    Current Medications (verified) Outpatient Encounter Medications as of 07/28/2021  Medication Sig   atorvastatin (LIPITOR) 40 MG tablet Take 1 tablet (40 mg  total) by mouth daily.   baclofen (LIORESAL) 10 MG tablet Take 1 tablet (10 mg total) by mouth 2 (two) times daily.   FLUoxetine (PROZAC) 20 MG capsule Take 20 mg by mouth daily.   hydrochlorothiazide (HYDRODIURIL) 25 MG tablet Take 1 tablet (25 mg total) by mouth daily.   hydrOXYzine (VISTARIL) 25 MG capsule Take 1 capsule (25 mg total) by mouth every 8 (eight) hours as needed.   ibandronate (BONIVA) 150 MG tablet Take 1 tablet (150 mg total) by mouth every 30 (thirty) days. Take in the morning with a full glass of water, on an empty stomach, and do not take anything else by mouth or lie down for the next 30 min.   ibuprofen (ADVIL) 400 MG tablet Take 1 tablet (400 mg total) by mouth 3 (three) times daily as needed.   Incontinence Supply Disposable (ENTRUST PLUS DISP UNDERPADS) MISC by Does not apply route.   Incontinence Supply Disposable (PROTECTIVE UNDERWEAR LARGE) MISC by Does not apply route.   meloxicam (MOBIC) 15 MG tablet Take 1 tablet (15 mg total) by mouth daily. (Patient taking differently: Take 15 mg by mouth daily. Taking prn)   omeprazole (PRILOSEC) 20 MG capsule Take 1 capsule (20 mg total) by mouth daily.   No facility-administered encounter medications on file as of 07/28/2021.    Allergies (verified) No known allergies   History: Past Medical History:  Diagnosis Date   Advance care planning 06/28/2019   Allergic rhinitis    Ataxia    Bursitis of shoulder 11/20/2015   Overview:  Last Assessment & Plan:  Discussed care and treatment shoulder bursitis with meloxicam   CP (  cerebral palsy) ([place])    Depression    Diverticulitis    Diverticulosis    Fatigue 02/05/2020   Hyperlipidemia    Hypertension    IFG (impaired fasting glucose)    Venous insufficiency    Venous stasis ulcer (HCC)    Vertigo    Past Surgical History:  Procedure Laterality Date   ABDOMINAL HYSTERECTOMY     complete   LAPAROSCOPY     legs and hip     due to CP   Family History  Problem  Relation Age of Onset   Diabetes Mother    Diabetes Father    Heart disease Maternal Uncle 90   Kidney disease Maternal Uncle    Social History   Socioeconomic History   Marital status: Single    Spouse name: Not on file   Number of children: Not on file   Years of education: Not on file   Highest education level: High school graduate  Occupational History   Occupation: disability   Tobacco Use   Smoking status: Every Day    Packs/day: 0.50    Types: Cigarettes   Smokeless tobacco: Never  Vaping Use   Vaping Use: Never used  Substance and Sexual Activity   Alcohol use: No    Alcohol/week: 0.0 standard drinks   Drug use: No   Sexual activity: Not Currently  Other Topics Concern   Not on file  Social History Narrative   Not on file   Social Determinants of Health   Financial Resource Strain: Low Risk    Difficulty of Paying Living Expenses: Not hard at all  Food Insecurity: No Food Insecurity   Worried About Charity fundraiser in the Last Year: Never true   Ran Out of Food in the Last Year: Never true  Transportation Needs: No Transportation Needs   Lack of Transportation (Medical): No   Lack of Transportation (Non-Medical): No  Physical Activity: Inactive   Days of Exercise per Week: 0 days   Minutes of Exercise per Session: 0 min  Stress: No Stress Concern Present   Feeling of Stress : Not at all  Social Connections: Not on file    Tobacco Counseling Ready to quit: Not Answered Counseling given: Not Answered   Clinical Intake:  Pre-visit preparation completed: Yes  Pain : No/denies pain     Nutritional Status: BMI 25 -29 Overweight Nutritional Risks: None Diabetes: No  How often do you need to have someone help you when you read instructions, pamphlets, or other written materials from your doctor or pharmacy?: 1 - Never What is the last grade level you completed in school?: 12th grade  Diabetic? no  Interpreter Needed?: No  Information  entered by :: Michelle Chandler   Activities of Daily Living In your present state of health, do you have any difficulty performing the following activities: 07/28/2021  Hearing? N  Vision? N  Difficulty concentrating or making decisions? N  Walking or climbing stairs? Y  Dressing or bathing? N  Doing errands, shopping? N  Preparing Food and eating ? N  Using the Toilet? N  In the past six months, have you accidently leaked urine? N  Do you have problems with loss of bowel control? N  Managing your Medications? N  Managing your Finances? N  Housekeeping or managing your Housekeeping? N  Some recent data might be hidden    Patient Care Team: Charlynne Cousins, MD as PCP - General Vanita Ingles, RN as Case  Manager ([place]) Greg Cutter, LCSW as Social Worker (Licensed Holiday representative)  Indicate any recent [employer] you may have received from other than [facility] providers in the past year (date may be approximate).     Assessment:   This is a routine wellness examination for Michelle Chandler.  Hearing/Vision screen Vision Screening - Comments:: Regular eye exams, Dr. Ellin Mayhew  Dietary issues and exercise activities discussed: Current Exercise Habits: The patient does not participate in regular exercise at present   Goals Addressed             This Visit's Progress    Patient Stated       07/28/2021, no goals       Depression Screen PHQ 2/9 Scores 07/28/2021 06/11/2021 03/12/2021 01/29/2021 10/04/2020 07/26/2020 07/11/2020  PHQ - 2 Score 2 0 '3 3 2 '$ 0 5  PHQ- 9 Score '3 2 9 9 7 '$ 0 16    Fall Risk Fall Risk  07/28/2021 06/11/2021 03/12/2021 07/26/2020 02/05/2020  Falls in the past year? 1 1 0 0 0  Comment leg gave away - - - -  Number falls in past yr: 0 1 - - -  Injury with Fall? 0 0 - - -  Risk Factor Category  - - - - -  Risk for fall due to : Impaired balance/gait;Impaired mobility;Medication side effect - - Impaired balance/gait;Impaired mobility;Medication side effect  History of fall(s);Impaired balance/gait;Impaired mobility  Follow up Falls evaluation completed;Education provided;Falls prevention discussed Falls evaluation completed - Falls evaluation completed;Education provided;Falls prevention discussed Falls evaluation completed    FALL RISK PREVENTION PERTAINING TO THE HOME:  Any stairs in or around the home? No  If so, are there any without handrails?  ramp Home free of loose throw rugs in walkways, pet beds, electrical cords, etc? Yes  Adequate lighting in your home to reduce risk of falls? Yes   ASSISTIVE DEVICES UTILIZED TO PREVENT FALLS:  Life alert? No  Use of a cane, walker or w/c? Yes  Grab bars in the bathroom? Yes  Shower chair or bench in shower? Yes  Elevated toilet seat or a handicapped toilet? Yes   TIMED UP AND GO:  Was the test performed? No .       Cognitive Function:     6CIT Screen 07/28/2021 07/26/2020 01/28/2018  What Year? 0 points 4 points 0 points  What month? 0 points 0 points 0 points  What time? 0 points 0 points 0 points  Count back from 20 0 points 0 points 0 points  Months in reverse 0 points 0 points 0 points  Repeat phrase 4 points 6 points 4 points  Total Score '4 10 4    '$ Immunizations Immunization History  Administered Date(s) Administered   Influenza,inj,Quad PF,6+ Mos 10/19/2017, 08/16/2018   Influenza-Unspecified 11/14/2014, 10/05/2016, 08/26/2020   PFIZER(Purple Top)SARS-COV-2 Vaccination 05/03/2020, 05/22/2020   Pneumococcal Polysaccharide-23 03/12/2021   Td 08/30/2007, 10/19/2017   Zoster Recombinat (Shingrix) 11/01/2020, 03/18/2021    TDAP status: Up to date  Flu Vaccine status: Due, Education has been provided regarding the importance of this vaccine. Advised may receive this vaccine at local pharmacy or Health Dept. Aware to provide a copy of the vaccination record if obtained from local pharmacy or Health Dept. Verbalized acceptance and understanding.  Pneumococcal vaccine status:  Up to date  Covid-19 vaccine status: Completed vaccines  Qualifies for Shingles Vaccine? Yes   Zostavax completed No   Shingrix Completed?: Yes  Screening Tests Health Maintenance  Topic  Date Due   COVID-19 Vaccine (3 - Pfizer risk series) 06/19/2020   INFLUENZA VACCINE  06/16/2021   COLONOSCOPY (Pts 45-67yr Insurance coverage will need to be confirmed)  01/29/2022 (Originally 02/27/2003)   Pneumococcal Vaccine [id number] old (2 - PCV) 03/12/2022   MAMMOGRAM  04/05/2022   TETANUS/TDAP  10/20/2027   PNEUMOCOCCAL POLYSACCHARIDE VACCINE AGE 41-64 HIGH RISK  Completed   Hepatitis C Screening  Completed   HIV Screening  Completed   Zoster Vaccines- Shingrix  Completed   HPV VACCINES  Aged Out    Health Maintenance  Health Maintenance Due  Topic Date Due   COVID-19 Vaccine (3 - Pfizer risk series) 06/19/2020   INFLUENZA VACCINE  06/16/2021    Colorectal cancer screening: due   Mammogram status: Completed 2022. Repeat every year  Bone Density status: n/a  Lung Cancer Screening: (Low Dose CT Chest recommended if Age 63-80years, 30 pack-year currently smoking OR have quit w/in 15years.) does not qualify.   Lung Cancer Screening Referral: no  Additional Screening:  Hepatitis C Screening: does qualify; Completed 11/20/2015  Vision Screening: Recommended annual ophthalmology exams for early detection of glaucoma and other disorders of the eye. Is the patient up to date with their annual eye exam?  No  Who is the provider or what is the name of the office in which the patient attends annual eye exams? Dr. WEllin MayhewIf pt is not established with a provider, would they like to be referred to a provider to establish care? No .   Dental Screening: Recommended annual dental exams for proper oral hygiene  Community Resource Referral / Chronic Care Management: CRR required this visit?  No   CCM required this visit?  No      Plan:     I have personally reviewed and noted the  following in the patient's chart:   Medical and social history Use of alcohol, tobacco or illicit drugs  Current medications and supplements including opioid prescriptions.  Functional ability and status Nutritional status Physical activity Advanced directives List of other physicians Hospitalizations, surgeries, and ER visits in previous 12 months Vitals Screenings to include cognitive, depression, and falls Referrals and appointments  In addition, I have reviewed and discussed with patient certain preventive protocols, quality metrics, and best practice recommendations. A written personalized care plan for preventive services as well as general preventive health recommendations were provided to patient.     Michelle Chandler   [id number]  Nurse Notes:

## 2021-07-30 ENCOUNTER — Ambulatory Visit (INDEPENDENT_AMBULATORY_CARE_PROVIDER_SITE_OTHER): Payer: Medicare Other

## 2021-07-30 ENCOUNTER — Other Ambulatory Visit: Payer: Self-pay

## 2021-07-30 DIAGNOSIS — M216X1 Other acquired deformities of right foot: Secondary | ICD-10-CM

## 2021-07-30 DIAGNOSIS — G801 Spastic diplegic cerebral palsy: Secondary | ICD-10-CM

## 2021-07-30 NOTE — Progress Notes (Signed)
CASTING: Custom Molded Ankle Foot Orthosis                                          Style: [employer], [place], [id number], [phone]  Patient was casted/scanned for a medically necessary Custom Molded Ankle Foot Orthosis (AFO) for bilat ankle/feet. The Custom Molded Ankle Foot Orthosis (AFO) was prescribed by Dr. Posey Pronto.  The cast/scan was taken in semi weight bearing position. The cast/scan was evaluated afterwards and deemed appropriate for fabrication.    The patient is ambulatory and has weakness or deformity of the foot and ankle and requires stabilization for medical reasons with the potential to benefit functionally.    The patient could not be fit with a prefabricated AFO as their condition necessitates wearing the orthosis for a duration of more than six months with the need to control the foot and ankle in more than one plane. The patient has a documented neurological, circulatory, or orthopedic condition that requires custom fabrication over a model to prevent tissue injury.   Please see prescribing physician chart notes for further documentation concerning the specific foot and ankle problems being treated by the prescribing physician. Patient will return in 4-5 weeks for dispensing of Custom Molded Ankle Foot Orthosis.

## 2021-08-25 ENCOUNTER — Telehealth: Payer: Self-pay

## 2021-08-25 NOTE — Chronic Care Management (AMB) (Signed)
    Chronic Care Management Pharmacy Assistant   Name: Michelle Chandler  MRN: [id number] DOB: Mar 11, 1956  Reason for Encounter: Disease State General   Recent office visits:  06/11/21-Avanti Neomia Dear, MD (PCP) General follow up. Start on Hydroxyzine 25 mg for anxiety. Ambulatory referral to Podiatry for Cerebral palsy. Labs ordered. 03/12/21-[name], MD (PCP) General follow up. Pneumococcal polysaccharide vaccine given.  Recent consult visits:  06/24/21-Kevin P. Posey Pronto, DPM (Podiatry) Seen for calloses. 03/18/21-Reid D. Woodard [facility])   Hospital visits:  None in previous 6 months  Medications: Outpatient Encounter Medications as of 08/25/2021  Medication Sig   atorvastatin (LIPITOR) 40 MG tablet Take 1 tablet (40 mg total) by mouth daily.   baclofen (LIORESAL) 10 MG tablet Take 1 tablet (10 mg total) by mouth 2 (two) times daily.   FLUoxetine (PROZAC) 20 MG capsule Take 20 mg by mouth daily.   hydrochlorothiazide (HYDRODIURIL) 25 MG tablet Take 1 tablet (25 mg total) by mouth daily.   hydrOXYzine (VISTARIL) 25 MG capsule Take 1 capsule (25 mg total) by mouth every 8 (eight) hours as needed.   ibandronate (BONIVA) 150 MG tablet Take 1 tablet (150 mg total) by mouth every 30 (thirty) days. Take in the morning with a full glass of water, on an empty stomach, and do not take anything else by mouth or lie down for the next 30 min.   ibuprofen (ADVIL) 400 MG tablet Take 1 tablet (400 mg total) by mouth 3 (three) times daily as needed.   Incontinence Supply Disposable (ENTRUST PLUS DISP UNDERPADS) MISC by Does not apply route.   Incontinence Supply Disposable (PROTECTIVE UNDERWEAR LARGE) MISC by Does not apply route.   meloxicam (MOBIC) 15 MG tablet Take 1 tablet (15 mg total) by mouth daily. (Patient taking differently: Take 15 mg by mouth daily. Taking prn)   omeprazole (PRILOSEC) 20 MG capsule Take 1 capsule (20 mg total) by mouth daily.   No facility-administered encounter medications on  file as of 08/25/2021.   Have you had any problems recently with your health? Patient states she has no problems with her health at this time.  Have you had any problems with your pharmacy? Patient states she has no problems with her pharmacy.  What issues or side effects are you having with your medications? Patient states she has no issues or side effects to any of her medications that she knows of.  What would you like me to pass along to [facility] Potts,[facility] for them to help you with?  Patient states there is nothing at this time.  What can we do to take care of you better? Patient states there is nothing at this time.  Care Gaps: [id number] Vaccine:Overdue since 06/19/2020 INFLUENZA VACCINE:Overdue since 06/16/2021  Star Rating Drugs: Atorvastatin 40 mg Last filled:05/22/21 6 DS  Myriam Elta [place], [place]

## 2021-08-26 ENCOUNTER — Other Ambulatory Visit: Payer: Self-pay | Admitting: Internal Medicine

## 2021-08-26 DIAGNOSIS — I10 Essential (primary) hypertension: Secondary | ICD-10-CM

## 2021-08-26 DIAGNOSIS — G801 Spastic diplegic cerebral palsy: Secondary | ICD-10-CM

## 2021-08-27 NOTE — Telephone Encounter (Signed)
Requested Prescriptions  Pending Prescriptions Disp Refills  . baclofen (LIORESAL) 10 MG tablet [Pharmacy Med Name: BACLOFEN 10 MG TABLET] 180 tablet 0    Sig: Take 1 tablet (10 mg total) by mouth 2 (two) times daily.     Not Delegated - Analgesics:  Muscle Relaxants Failed - 08/26/2021  6:38 PM      Failed - This refill cannot be delegated      Passed - Valid encounter within last 6 months    Recent Outpatient Visits          2 months ago Prediabetes   [place] Vigg, Avanti, MD   5 months ago H/O transfusion   [place], MD   7 months ago Essential (primary) hypertension   [place] Vigg, Avanti, MD   10 months ago Depression, recurrent ([place])   [place] Eulogio Bear, NP   1 year ago Screening for colon cancer   [place] Eulogio Bear, NP      Future Appointments            In 2 weeks Vigg, Avanti, MD [facility], [facility]   In 11 months  [place], [facility]           . ibuprofen (ADVIL) 400 MG tablet [Pharmacy Med Name: IBUPROFEN 400 MG TABLET] 90 tablet 0    Sig: Take 1 tablet (400 mg total) by mouth 3 (three) times daily as needed.     Analgesics:  NSAIDS Passed - 08/26/2021  6:38 PM      Passed - Cr in normal range and within 360 days    Creatinine, Ser  Date Value Ref Range Status  06/11/2021 0.64 0.57 - 1.00 mg/dL Final         Passed - HGB in normal range and within 360 days    Hemoglobin  Date Value Ref Range Status  06/11/2021 14.5 11.1 - 15.9 g/dL Final         Passed - Patient is not pregnant      Passed - Valid encounter within last 12 months    Recent Outpatient Visits          2 months ago Prediabetes   [place] Vigg, Avanti, MD   5 months ago H/O transfusion   [place], MD   7 months ago Essential (primary) hypertension   [place] Vigg, Avanti, MD   10 months  ago Depression, recurrent ([place])   [place] Eulogio Bear, NP   1 year ago Screening for colon cancer   [place] Eulogio Bear, NP      Future Appointments            In 2 weeks Vigg, Avanti, MD [facility], [facility]   In 67 months  [employer], [place]           . atorvastatin (LIPITOR) 40 MG tablet [Pharmacy Med Name: ATORVASTATIN 40 MG TABLET] 90 tablet 0    Sig: Take 1 tablet (40 mg total) by mouth daily.     Cardiovascular:  Antilipid - Statins Failed - 08/26/2021  6:38 PM      Failed - LDL in normal range and within 360 days    LDL Chol Calc (NIH)  Date Value Ref Range Status  02/21/2021 108 (H) 0 - 99 mg/dL Final         Passed -  Total Cholesterol in normal range and within 360 days    Cholesterol, Total  Date Value Ref Range Status  02/21/2021 189 100 - 199 mg/dL Final   Cholesterol Piccolo, Waived  Date Value Ref Range Status  08/16/2018 231 (H) <200 mg/dL Final    Comment:                            Desirable                <200                         Borderline High      200- 239                         High                     >239          Passed - HDL in normal range and within 360 days    HDL  Date Value Ref Range Status  02/21/2021 56 >39 mg/dL Final         Passed - Triglycerides in normal range and within 360 days    Triglycerides  Date Value Ref Range Status  02/21/2021 142 0 - 149 mg/dL Final   Triglycerides Piccolo,Waived  Date Value Ref Range Status  08/16/2018 266 (H) <150 mg/dL Final    Comment:                            Normal                   <150                         Borderline High     150 - 199                         High                200 - 499                         Very High                >499          Passed - Patient is not pregnant      Passed - Valid encounter within last 12 months    Recent Outpatient Visits          2 months ago Prediabetes    [place] Vigg, Avanti, MD   5 months ago H/O transfusion   [place], Avanti, MD   7 months ago Essential (primary) hypertension   [facility] Vigg, Avanti, MD   10 months ago Depression, recurrent ([place])   [place] Eulogio Bear, NP   1 year ago Screening for colon cancer   [facility] Eulogio Bear, NP      Future Appointments            In 2 weeks Vigg, Avanti, MD [facility], [facility]   In 16 months  [employer], [facility]           .  hydrochlorothiazide (HYDRODIURIL) 25 MG tablet [Pharmacy Med Name: HYDROCHLOROTHIAZIDE 25 MG TAB] 90 tablet 0    Sig: Take 1 tablet (25 mg total) by mouth daily.     Cardiovascular: Diuretics - Thiazide Passed - 08/26/2021  6:38 PM      Passed - Ca in normal range and within 360 days    Calcium  Date Value Ref Range Status  06/11/2021 9.8 8.7 - 10.3 mg/dL Final         Passed - Cr in normal range and within 360 days    Creatinine, Ser  Date Value Ref Range Status  06/11/2021 0.64 0.57 - 1.00 mg/dL Final         Passed - K in normal range and within 360 days    Potassium  Date Value Ref Range Status  06/11/2021 3.9 3.5 - 5.2 mmol/L Final         Passed - Na in normal range and within 360 days    Sodium  Date Value Ref Range Status  06/11/2021 137 134 - 144 mmol/L Final         Passed - Last BP in normal range    BP Readings from Last 1 Encounters:  06/24/21 123/75         Passed - Valid encounter within last 6 months    Recent Outpatient Visits          2 months ago Prediabetes   [place] Vigg, Avanti, MD   5 months ago H/O transfusion   [place], Avanti, MD   7 months ago Essential (primary) hypertension   [facility] Vigg, Avanti, MD   10 months ago Depression, recurrent ([place])   [place] Eulogio Bear, NP   1 year ago Screening for colon cancer    [facility] Eulogio Bear, NP      Future Appointments            In 2 weeks Vigg, Avanti, MD [facility], [facility]   In 3 months  [employer], [place]

## 2021-08-27 NOTE — Telephone Encounter (Signed)
Requested medications are due for refill today.  yes  Requested medications are on the active medications list.  yes  Last refill. 05/22/2021  Future visit scheduled.   yes  Notes to clinic.  Medication not delegated.

## 2021-09-11 ENCOUNTER — Ambulatory Visit: Payer: Medicare Other | Admitting: Internal Medicine

## 2021-09-16 ENCOUNTER — Encounter: Payer: Self-pay | Admitting: Internal Medicine

## 2021-09-16 ENCOUNTER — Other Ambulatory Visit: Payer: Self-pay

## 2021-09-16 ENCOUNTER — Ambulatory Visit (INDEPENDENT_AMBULATORY_CARE_PROVIDER_SITE_OTHER): Payer: Medicare Other | Admitting: Internal Medicine

## 2021-09-16 ENCOUNTER — Telehealth: Payer: Self-pay | Admitting: Podiatry

## 2021-09-16 VITALS — BP 128/80 | HR 82 | Temp 99.2°F | Ht 59.84 in | Wt 143.6 lb

## 2021-09-16 DIAGNOSIS — E7800 Pure hypercholesterolemia, unspecified: Secondary | ICD-10-CM | POA: Diagnosis not present

## 2021-09-16 DIAGNOSIS — M810 Age-related osteoporosis without current pathological fracture: Secondary | ICD-10-CM

## 2021-09-16 DIAGNOSIS — F419 Anxiety disorder, unspecified: Secondary | ICD-10-CM | POA: Diagnosis not present

## 2021-09-16 DIAGNOSIS — Z23 Encounter for immunization: Secondary | ICD-10-CM | POA: Insufficient documentation

## 2021-09-16 DIAGNOSIS — G801 Spastic diplegic cerebral palsy: Secondary | ICD-10-CM

## 2021-09-16 DIAGNOSIS — R7303 Prediabetes: Secondary | ICD-10-CM | POA: Diagnosis not present

## 2021-09-16 LAB — BAYER DCA HB A1C WAIVED: HB A1C (BAYER DCA - WAIVED): 5.9 % — ABNORMAL HIGH (ref 4.8–5.6)

## 2021-09-16 MED ORDER — BACLOFEN 10 MG PO TABS: 10.0000 mg | ORAL_TABLET | Freq: Every evening | ORAL | 2 refills | Status: AC

## 2021-09-16 NOTE — Progress Notes (Signed)
Please let pt know this was normal.

## 2021-09-16 NOTE — Progress Notes (Signed)
BP 128/80   Pulse 82   Temp 99.2 F (37.3 C) (Oral)   Ht 4' 11.84" (1.52 m)   Wt 143 lb 9.6 oz (65.1 kg)   SpO2 98%   BMI 28.19 kg/m    Subjective:    Patient ID: Michelle Chandler, female    DOB: 11-May-1956, 65 y.o.   MRN: [id number]  Chief Complaint  Patient presents with  . Prediabetes  . Hypertension  . Hyperlipidemia  . Depression  . Anxiety  . Gastroesophageal Reflux    HPI: Michelle Chandler is a 65 y.o. female  Pt is here for a follow up she feels well is here with her aide.   Hypertension This is a chronic problem. The current episode started more than 1 month ago. The problem has been gradually improving since onset. The problem is controlled. Pertinent negatives include no anxiety, chest pain, malaise/fatigue, neck pain, peripheral edema, PND, shortness of breath or sweats.  Hyperlipidemia This is a chronic problem. The problem is controlled. Pertinent negatives include no chest pain or shortness of breath.  Depression        This is a chronic problem.  The current episode started more than 1 year ago.   The onset quality is gradual.   The problem occurs constantly.   Pertinent negatives include no anxiety. Anxiety Presents for follow-up visit. Patient reports no chest pain, irritability or shortness of breath.    Gastroesophageal Reflux She reports no chest pain.    Chief Complaint  Patient presents with  . Prediabetes  . Hypertension  . Hyperlipidemia  . Depression  . Anxiety  . Gastroesophageal Reflux    Relevant past medical, surgical, family and social history reviewed and updated as indicated. Interim medical history since our last visit reviewed. Allergies and medications reviewed and updated.  Review of Systems  Constitutional:  Negative for irritability and malaise/fatigue.  Respiratory:  Negative for shortness of breath.   Cardiovascular:  Negative for chest pain and PND.  Musculoskeletal:  Negative for neck pain.  Psychiatric/Behavioral:   Positive for depression.    Per HPI unless specifically indicated above     Objective:    BP 128/80   Pulse 82   Temp 99.2 F (37.3 C) (Oral)   Ht 4' 11.84" (1.52 m)   Wt 143 lb 9.6 oz (65.1 kg)   SpO2 98%   BMI 28.19 kg/m   Wt Readings from Last 3 Encounters:  09/16/21 143 lb 9.6 oz (65.1 kg)  07/28/21 150 lb (68 kg)  03/12/21 143 lb 9.6 oz (65.1 kg)    Physical Exam Vitals and nursing note reviewed.  Constitutional:      General: She is not in acute distress.    Appearance: Normal appearance. She is not ill-appearing or diaphoretic.  Eyes:     Conjunctiva/sclera: Conjunctivae normal.  Cardiovascular:     Rate and Rhythm: Normal rate and regular rhythm.     Heart sounds: No murmur heard. Pulmonary:     Effort: No respiratory distress.     Breath sounds: No rhonchi.  Abdominal:     General: There is no distension.     Palpations: Abdomen is soft. There is no mass.     Tenderness: There is no abdominal tenderness.  Musculoskeletal:        General: No swelling.  Skin:    General: Skin is warm and dry.     Coloration: Skin is not jaundiced.     Findings: No erythema.  Neurological:  Mental Status: She is alert.  Psychiatric:        Mood and Affect: Mood normal.        Behavior: Behavior normal.   Results for orders placed or performed in visit on 06/11/21  Microscopic Examination   BLD  Result Value Ref Range   WBC, UA 0-5 0 - 5 /hpf   RBC 0-2 0 - 2 /hpf   Epithelial Cells (non renal) 0-10 0 - 10 /hpf   Bacteria, UA Many (A) None seen/Few  Bayer DCA Hb A1c Waived  Result Value Ref Range   HB A1C (BAYER DCA - WAIVED) 5.8 <7.0 %  Urinalysis, Routine w reflex microscopic  Result Value Ref Range   Specific Gravity, UA 1.020 1.005 - 1.030   pH, UA 7.0 5.0 - 7.5   Color, UA Yellow Yellow   Appearance Ur Cloudy (A) Clear   Leukocytes,UA Trace (A) Negative   Protein,UA Negative Negative/Trace   Glucose, UA Negative Negative   Ketones, UA Negative Negative    RBC, UA Trace (A) Negative   Bilirubin, UA Negative Negative   Urobilinogen, Ur 0.2 0.2 - 1.0 mg/dL   Nitrite, UA Positive (A) Negative   Microscopic Examination See below:   Comprehensive metabolic panel  Result Value Ref Range   Glucose 94 65 - 99 mg/dL   BUN 11 8 - 27 mg/dL   Creatinine, Ser 0.64 0.57 - 1.00 mg/dL   eGFR 99 >59 mL/min/1.73   BUN/Creatinine Ratio 17 12 - 28   Sodium 137 134 - 144 mmol/L   Potassium 3.9 3.5 - 5.2 mmol/L   Chloride 97 96 - 106 mmol/L   CO2 26 20 - 29 mmol/L   Calcium 9.8 8.7 - 10.3 mg/dL   Total Protein 6.9 6.0 - 8.5 g/dL   Albumin 4.1 3.8 - 4.8 g/dL   Globulin, Total 2.8 1.5 - 4.5 g/dL   Albumin/Globulin Ratio 1.5 1.2 - 2.2   Bilirubin Total 0.2 0.0 - 1.2 mg/dL   Alkaline Phosphatase 107 44 - 121 IU/L   AST 15 0 - 40 IU/L   ALT 12 0 - 32 IU/L  CBC with Differential/Platelet  Result Value Ref Range   WBC 9.7 3.4 - 10.8 x10E3/uL   RBC 4.58 3.77 - 5.28 x10E6/uL   Hemoglobin 14.5 11.1 - 15.9 g/dL   Hematocrit 43.5 34.0 - 46.6 %   MCV 95 79 - 97 fL   MCH 31.7 26.6 - 33.0 pg   MCHC 33.3 31.5 - 35.7 g/dL   RDW 12.1 11.7 - 15.4 %   Platelets 414 150 - 450 x10E3/uL   Neutrophils 66 Not Estab. %   Lymphs 24 Not Estab. %   Monocytes 7 Not Estab. %   Eos 2 Not Estab. %   Basos 1 Not Estab. %   Neutrophils Absolute 6.4 1.4 - 7.0 x10E3/uL   Lymphocytes Absolute 2.3 0.7 - 3.1 x10E3/uL   Monocytes Absolute 0.7 0.1 - 0.9 x10E3/uL   EOS (ABSOLUTE) 0.2 0.0 - 0.4 x10E3/uL   Basophils Absolute 0.1 0.0 - 0.2 x10E3/uL   Immature Granulocytes 0 Not Estab. %   Immature Grans (Abs) 0.0 0.0 - 0.1 x10E3/uL        Current Outpatient Medications:  .  atorvastatin (LIPITOR) 40 MG tablet, Take 1 tablet (40 mg total) by mouth daily., Disp: 90 tablet, Rfl: 0 .  FLUoxetine (PROZAC) 20 MG capsule, Take 20 mg by mouth daily., Disp: , Rfl:  .  hydrochlorothiazide (HYDRODIURIL) 25  MG tablet, Take 1 tablet (25 mg total) by mouth daily., Disp: 90 tablet, Rfl: 0 .   hydrOXYzine (VISTARIL) 25 MG capsule, Take 1 capsule (25 mg total) by mouth every 8 (eight) hours as needed., Disp: 30 capsule, Rfl: 1 .  ibandronate (BONIVA) 150 MG tablet, Take 1 tablet (150 mg total) by mouth every 30 (thirty) days. Take in the morning with a full glass of water, on an empty stomach, and do not take anything else by mouth or lie down for the next 30 min., Disp: 12 tablet, Rfl: 1 .  ibuprofen (ADVIL) 400 MG tablet, Take 1 tablet (400 mg total) by mouth 3 (three) times daily as needed., Disp: 90 tablet, Rfl: 0 .  Incontinence Supply Disposable (ENTRUST PLUS DISP UNDERPADS) MISC, by Does not apply route., Disp: , Rfl:  .  Incontinence Supply Disposable (PROTECTIVE UNDERWEAR LARGE) MISC, by Does not apply route., Disp: , Rfl:  .  omeprazole (PRILOSEC) 20 MG capsule, Take 1 capsule (20 mg total) by mouth daily., Disp: 90 capsule, Rfl: 1 .  baclofen (LIORESAL) 10 MG tablet, Take 1 tablet (10 mg total) by mouth every evening., Disp: 30 tablet, Rfl: 2    Assessment & Plan:   pH, UA 7.0 5.0 - 7.5     Color, UA Yellow Yellow    Appearance Ur Cloudy (A) Clear    Leukocytes,UA Trace (A) Negative    Protein,UA Negative Negative/Trace    Glucose, UA Negative Negative    Ketones, UA Negative Negative    RBC, UA Trace (A) Negative    Bilirubin, UA Negative Negative    Urobilinogen, Ur 0.2 0.2 - 1.0 mg/dL    Nitrite, UA Positive (A) Negative    Microscopic Examination See below:    Comprehensive metabolic panel  Result Value Ref Range    Glucose 94 65 - 99 mg/dL    BUN 11 8 - 27 mg/dL    Creatinine, Ser 0.64 0.57 - 1.00 mg/dL    eGFR 99 >59 mL/min/1.73    BUN/Creatinine Ratio 17 12 - 28    Sodium 137 134 - 144 mmol/L    Potassium 3.9 3.5 - 5.2 mmol/L    Chloride 97 96 - 106 mmol/L    CO2 26 20 - 29 mmol/L    Calcium 9.8 8.7 - 10.3 mg/dL    Total Protein 6.9 6.0 - 8.5 g/dL    Albumin 4.1 3.8 - 4.8 g/dL    Globulin, Total 2.8 1.5 - 4.5 g/dL    Albumin/Globulin Ratio 1.5 1.2 -  2.2    Bilirubin Total 0.2 0.0 - 1.2 mg/dL    Alkaline Phosphatase 107 44 - 121 IU/L    AST 15 0 - 40 IU/L    ALT 12 0 - 32 IU/L  CBC with Differential/Platelet  Result Value Ref Range    WBC 9.7 3.4 - 10.8 x10E3/uL    RBC 4.58 3.77 - 5.28 x10E6/uL    Hemoglobin 14.5 11.1 - 15.9 g/dL    Hematocrit 43.5 34.0 - 46.6 %    MCV 95 79 - 97 fL    MCH 31.7 26.6 - 33.0 pg    MCHC 33.3 31.5 - 35.7 g/dL    RDW 12.1 11.7 - 15.4 %    Platelets 414 150 - 450 x10E3/uL    Neutrophils 66 Not Estab. %    Lymphs 24 Not Estab. %    Monocytes 7 Not Estab. %    Eos 2 Not Estab. %  Basos 1 Not Estab. %    Neutrophils Absolute 6.4 1.4 - 7.0 x10E3/uL    Lymphocytes Absolute 2.3 0.7 - 3.1 x10E3/uL    Monocytes Absolute 0.7 0.1 - 0.9 x10E3/uL    EOS (ABSOLUTE) 0.2 0.0 - 0.4 x10E3/uL    Basophils Absolute 0.1 0.0 - 0.2 x10E3/uL    Immature Granulocytes 0 Not Estab. %    Immature Grans (Abs) 0.0 0.0 - 0.1 x10E3/uL            Current Outpatient Medications:  .  atorvastatin (LIPITOR) 40 MG tablet, Take 1 tablet (40 mg total) by mouth daily., Disp: 90 tablet, Rfl: 0 .  baclofen (LIORESAL) 10 MG tablet, Take 1 tablet (10 mg total) by mouth 2 (two) times daily., Disp: 180 tablet, Rfl: 0 .  FLUoxetine (PROZAC) 20 MG capsule, Take 20 mg by mouth daily., Disp: , Rfl:  .  hydrochlorothiazide (HYDRODIURIL) 25 MG tablet, Take 1 tablet (25 mg total) by mouth daily., Disp: 90 tablet, Rfl: 1 .  hydrOXYzine (VISTARIL) 25 MG capsule, Take 1 capsule (25 mg total) by mouth every 8 (eight) hours as needed., Disp: 30 capsule, Rfl: 1 .  ibandronate (BONIVA) 150 MG tablet, Take 1 tablet (150 mg total) by mouth every 30 (thirty) days. Take in the morning with a full glass of water, on an empty stomach, and do not take anything else by mouth or lie down for the next 30 min., Disp: 12 tablet, Rfl: 1 .  ibuprofen (ADVIL) 400 MG tablet, Take 1 tablet (400 mg total) by mouth 3 (three) times daily as needed., Disp: 90 tablet, Rfl:  0 .  Incontinence Supply Disposable (ENTRUST PLUS DISP UNDERPADS) MISC, by Does not apply route., Disp: , Rfl:  .  Incontinence Supply Disposable (PROTECTIVE UNDERWEAR LARGE) MISC, by Does not apply route., Disp: , Rfl:  .  meloxicam (MOBIC) 15 MG tablet, Take 1 tablet (15 mg total) by mouth daily. (Patient taking differently: Take 15 mg by mouth daily. Taking prn), Disp: 90 tablet, Rfl: 1 .  omeprazole (PRILOSEC) 20 MG capsule, Take 1 capsule (20 mg total) by mouth daily., Disp: 90 capsule, Rfl: 1      Assessment & Plan:  Osteoporosis :  OSTEOPOROSIS patient advised to take this once a month on empty stomach needs to sit upright for an hour after  ake this drug by mouth with a full glass of water. Take it as directed on the prescription label on the same day of each month.Take the dose right after waking up. Do not eat or drink anything before taking it. Do not take it with any other drink except water. Do not chew or crush the tablet. After taking it, do not eat breakfast, drink, or take any other drugs or vitamins for at least 30 minutes. Sit or stand up for at least 60 minutes after you take it. Do notlie down. Keep taking it unless your health care provider tells you to stop.     2. . HTN : Continue current meds.  Medication compliance emphasised. pt advised to keep Bp logs. Pt verbalised understanding of the same. Pt to have a low salt diet . Exercise to reach a goal of at least 150 mins a week.  lifestyle modifications explained and pt understands importance of the above.   4. Needs a rx for a Help chair - uses this for her room helps her to stand up.   5. Cerebral palsy : using baclofen , Needs shoes  -  to u with podiatry  Needs handicap sticker   6. Anxiety start pt on hydroxyzine: Under good control on current regimen. Continue current regimen. Continue to monitor.   7. Prediabetes: Lifestyle modifications advised to pt.a1c : 5.9   Portion control and avoiding high carb low fat diet  advised.  Diet plan given to pt   exercise plan given and encouraged.  To increase exercise to 150 mins a week ie 21/2 hours a week. Pt verbalises understanding of the above.   Problem List Items Addressed This Visit       Nervous and Auditory   Cerebral palsy (HCC)   Relevant Medications   baclofen (LIORESAL) 10 MG tablet     Musculoskeletal and Integument   Osteoporosis     Other   Hyperlipidemia   Prediabetes - Primary   Relevant Orders   Bayer DCA Hb A1c Waived (STAT)   CBC With Differential   CMP14+EGFR   Anxiety   Need for influenza vaccination   Relevant Orders   Flu Vaccine QUAD 3moIM (Fluarix, Fluzone & Alfiuria Quad PF)     Orders Placed This Encounter  Procedures  . Flu Vaccine QUAD 632moM (Fluarix, Fluzone & Alfiuria Quad PF)  . Bayer DCA Hb A1c Waived (STAT)  . CBC With Differential  . CMP14+EGFR     Meds ordered this encounter  Medications  . baclofen (LIORESAL) 10 MG tablet    Sig: Take 1 tablet (10 mg total) by mouth every evening.    Dispense:  30 tablet    Refill:  2     Follow up plan: No follow-ups on file.

## 2021-09-16 NOTE — Telephone Encounter (Signed)
Brace is in [place] to be taken [place].. called pt this morning and this afternoon to let her know I scheduled her to see EJ on 11.9 but she did not answer nor does she have a voicemail to leave a message. Also her mychart is not active. If pt calls back she is already scheduled to see Ej on 11.09 in [place] at 130 to pick up her brace.

## 2021-09-17 ENCOUNTER — Telehealth: Payer: Medicare Other | Admitting: General Practice

## 2021-09-17 ENCOUNTER — Ambulatory Visit (INDEPENDENT_AMBULATORY_CARE_PROVIDER_SITE_OTHER): Payer: Medicare Other

## 2021-09-17 DIAGNOSIS — I10 Essential (primary) hypertension: Secondary | ICD-10-CM

## 2021-09-17 DIAGNOSIS — E7800 Pure hypercholesterolemia, unspecified: Secondary | ICD-10-CM

## 2021-09-17 DIAGNOSIS — F419 Anxiety disorder, unspecified: Secondary | ICD-10-CM

## 2021-09-17 DIAGNOSIS — E785 Hyperlipidemia, unspecified: Secondary | ICD-10-CM

## 2021-09-17 DIAGNOSIS — F339 Major depressive disorder, recurrent, unspecified: Secondary | ICD-10-CM

## 2021-09-17 DIAGNOSIS — G809 Cerebral palsy, unspecified: Secondary | ICD-10-CM

## 2021-09-17 LAB — CBC WITH DIFFERENTIAL
Basophils Absolute: 0.1 10*3/uL (ref 0.0–0.2)
Basos: 1 %
EOS (ABSOLUTE): 0.2 10*3/uL (ref 0.0–0.4)
Eos: 2 %
Hematocrit: 43.5 % (ref 34.0–46.6)
Hemoglobin: 14.9 g/dL (ref 11.1–15.9)
Immature Grans (Abs): 0 10*3/uL (ref 0.0–0.1)
Immature Granulocytes: 0 %
Lymphocytes Absolute: 2 10*3/uL (ref 0.7–3.1)
Lymphs: 24 %
MCH: 31.6 pg (ref 26.6–33.0)
MCHC: 34.3 g/dL (ref 31.5–35.7)
MCV: 92 fL (ref 79–97)
Monocytes Absolute: 0.6 10*3/uL (ref 0.1–0.9)
Monocytes: 7 %
Neutrophils Absolute: 5.5 10*3/uL (ref 1.4–7.0)
Neutrophils: 66 %
RBC: 4.71 x10E6/uL (ref 3.77–5.28)
RDW: 11.2 % — ABNORMAL LOW (ref 11.7–15.4)
WBC: 8.3 10*3/uL (ref 3.4–10.8)

## 2021-09-17 NOTE — Chronic Care Management (AMB) (Signed)
Chronic Care Management   CCM RN Visit Note  09/17/2021 Name: Michelle Chandler MRN: [id number] DOB: 1956-04-12  Subjective: Michelle Chandler is a 65 y.o. year old female who is a primary care patient of Vigg, Avanti, MD. The care management team was consulted for assistance with disease management and care coordination needs.    Engaged with patient by telephone for follow up visit in response to provider referral for case management and/or care coordination services.   Consent to Services:  The patient was given information about Chronic Care Management services, agreed to services, and gave verbal consent prior to initiation of services.  Please see initial visit note for detailed documentation.   Patient agreed to services and verbal consent obtained.   Assessment: Review of patient past medical history, allergies, medications, health status, including review of consultants reports, laboratory and other test data, was performed as part of comprehensive evaluation and provision of chronic care management services.   SDOH (Social Determinants of Health) assessments and interventions performed:  SDOH Interventions    Flowsheet Row Most Recent Value  SDOH Interventions   Food Insecurity Interventions Intervention Not Indicated  Financial Strain Interventions Intervention Not Indicated  Housing Interventions Intervention Not Indicated  Intimate Partner Violence Interventions Intervention Not Indicated  Physical Activity Interventions Other (Comments)  [limited ability to do activity due to chronic conditions and CP]  Social Connections Interventions Intervention Not Indicated, Other (Comment)  [has good support system]  Transportation Interventions Intervention Not Indicated        CCM Care Plan  Allergies  Allergen Reactions   No Known Allergies     Outpatient Encounter Medications as of 09/17/2021  Medication Sig   atorvastatin (LIPITOR) 40 MG tablet Take 1 tablet (40 mg total) by  mouth daily.   baclofen (LIORESAL) 10 MG tablet Take 1 tablet (10 mg total) by mouth every evening.   FLUoxetine (PROZAC) 20 MG capsule Take 20 mg by mouth daily.   hydrochlorothiazide (HYDRODIURIL) 25 MG tablet Take 1 tablet (25 mg total) by mouth daily.   hydrOXYzine (VISTARIL) 25 MG capsule Take 1 capsule (25 mg total) by mouth every 8 (eight) hours as needed.   ibandronate (BONIVA) 150 MG tablet Take 1 tablet (150 mg total) by mouth every 30 (thirty) days. Take in the morning with a full glass of water, on an empty stomach, and do not take anything else by mouth or lie down for the next 30 min.   ibuprofen (ADVIL) 400 MG tablet Take 1 tablet (400 mg total) by mouth 3 (three) times daily as needed.   Incontinence Supply Disposable (ENTRUST PLUS DISP UNDERPADS) MISC by Does not apply route.   Incontinence Supply Disposable (PROTECTIVE UNDERWEAR LARGE) MISC by Does not apply route.   omeprazole (PRILOSEC) 20 MG capsule Take 1 capsule (20 mg total) by mouth daily.   No facility-administered encounter medications on file as of 09/17/2021.    Patient Active Problem List   Diagnosis Date Noted   Need for influenza vaccination 09/16/2021   Anxiety 06/12/2021   Osteoporosis 06/12/2021   Prediabetes 10/07/2020   Gastroesophageal reflux disease 06/10/2020   Smoking greater than 40 pack years 06/10/2020   Cerebral palsy ([place]) 12/27/2015   Hyperlipidemia 12/27/2015   Essential (primary) hypertension 12/27/2015   Depression, recurrent ([place]) 11/20/2015    Conditions to be addressed/monitored:HTN, HLD, Anxiety, Depression, and CP  Care Plan : RNCM: Depression (Adult) and anxiety  Updates made by Vanita Ingles, RN since 09/17/2021 12:00 AM  Completed  09/17/2021   Problem: RNCM: Depression Identification (Depression) and Anxiety Resolved 09/17/2021  Priority: Medium     Long-Range Goal: RNCM: Depressive Symptoms Identified/Anxiety triggers Completed 09/17/2021  Start Date: 01/08/2021  Expected  End Date: 03/30/2022  Recent Progress: On track  Priority: High  Note:   Current Barriers: Duplicate goal, resolving Knowledge Deficits related to resources available for effective management of depression and anxiety  Chronic Disease Management support and education needs related to depression and anxiety  Lacks caregiver support.  Limited mobility- CP Lacks social connections Unable to perform ADLs independently Unable to perform IADLs independently Does not contact provider office for questions/concerns  Nurse Case Manager Clinical Goal(s):  patient will verbalize understanding of plan for effective management of anxiety and depression  patient will work with [place], [place] team, and pcp  to address needs related to effective management of depression and anxiety   Interventions:  1:1 collaboration with Charlynne Cousins, MD regarding development and update of comprehensive plan of care as evidenced by provider attestation and co-signature Inter-disciplinary care team collaboration (see longitudinal plan of care) Evaluation of current treatment plan related to depression and anxiety  and patient's adherence to plan as established by provider. 03-05-2021: The patient is doing well with weaning off of her ativan and taking her Prozac. She states she is not having any new concerns. Has follow up with pcp on 03-12-2021 for follow up and evaluation. 05-14-2021: The patient feels like she is stable at this time. She does not like the hot weather due to it messing with her sinuses and causing issues but other than that she feels she is doing well. Denies any acute distress. Will continue to monitor. Knows to call the [facility] for changes in condition. 07-16-2021: The patient is doing well and feels like her anxiety and depression are stable. She is enjoying the cooler temperatures outside and states that it is supposed to be cooler this weekend.  Will continue to monitor for changes.  Advised patient to call the  office for changes in condition/ changes in mood/increased anxiety or depression Provided education to patient re: alternative methods to managing depression and anxiety. Cutting back on smoking. The patient is not ready to change her smoking habits. She smokes as a coping mechanism.  She also watches a lot of TV. Reviewed scheduled/upcoming provider appointments including: 09-11-2021 at 1 pm Discussed plans with patient for ongoing care management follow up and provided patient with direct contact information for care management team  Patient Goals/Self-Care Activities  patient will:  - Patient will self administer medications as prescribed Patient will attend all scheduled provider appointments Patient will call pharmacy for medication refills Patient will attend church or other social activities Patient will call provider office for new concerns or questions Patient will work with BSW to address care coordination needs and will continue to work with the clinical team to address health care and disease management related needs.   - anxiety screen reviewed - depression screen reviewed - medication list reviewed  Follow Up Plan: Telephone follow up appointment with care management team member scheduled for:09-17-2021 at 1 pm         Care Plan : RNCM: HLD Management  Updates made by Vanita Ingles, RN since 09/17/2021 12:00 AM  Completed 09/17/2021   Problem: RNCM: HLD Management Resolved 09/17/2021  Priority: Medium     Long-Range Goal: RNCM: HLD Management Completed 09/17/2021  Start Date: 01/08/2021  Expected End Date: 02/28/2022  Recent Progress: On track  Priority: High  Note:   Current Barriers: Resolving, duplicate goal  Poorly controlled hyperlipidemia, complicated by smoker, limited mobility, poor dietary choices  Current antihyperlipidemic regimen: atorvastatin 40 mg QD Most recent lipid panel:  Lab Results  Component Value Date   CHOL 189 02/21/2021   CHOL 170  10/04/2020   CHOL 325 (H) 07/11/2020   Lab Results  Component Value Date   HDL 56 02/21/2021   HDL 48 10/04/2020   HDL 44 07/11/2020   Lab Results  Component Value Date   LDLCALC 108 (H) 02/21/2021   LDLCALC 102 (H) 10/04/2020   LDLCALC 240 (H) 07/11/2020   Lab Results  Component Value Date   TRIG 142 02/21/2021   TRIG 110 10/04/2020   TRIG 203 (H) 07/11/2020   Lab Results  Component Value Date   CHOLHDL 3.4 02/21/2021   CHOLHDL 4.2 12/28/2016   CHOLHDL 4.0 11/20/2015   No results found for: LDLDIRECT  ASCVD risk enhancing conditions: age 15, current smoker Lacks Scientist, product/process development Does not contact provider office for Health visitor Clinical Goal(s):   patient will work with Consulting civil engineer, providers, and care team towards execution of optimized self-health management plan patient will verbalize understanding of plan for effective management of HLD  patient will work with RNCM and pcp  to address needs related to HLD   Interventions: Collaboration with Vigg, Avanti, MD regarding development and update of comprehensive plan of care as evidenced by provider attestation and co-signature Inter-disciplinary care team collaboration (see longitudinal plan of care) Medication review performed; medication list updated in electronic medical record.  Inter-disciplinary care team collaboration (see longitudinal plan of care) Referred to pharmacy team for assistance with HLD medication management Evaluation of current treatment plan related to HLD  and patient's adherence to plan as established by provider. 07-16-2021: The patient is doing well and monitoring diet. Encouraged her also to keep a check on her blood pressures and record. The patient states that her blood pressures are normal but could not give any numbers. She states she is actually eating better than she has been. She states that she likes fresh vegetables from the garden when she can get them. She  denies any issues with dietary restrictions and she states she is resting well at night. Will continue to monitor for changes or new concerns.  Advised patient to call the office for changes or questions  Provided education to patient re: heart healthy diet and monitoring for excess sodium and fats. The patient verbalized she is eating better than what she was.  She is cutting back on her unhealthy options and eating more than one time a day. Praised for positive changes.  07-16-2021: Review of dietary changes and the patient states she is watching her diet more and eating better.  Reviewed medications with patient and discussed compliance. 07-16-2021: The patient is taking medications as directed Discussed plans with patient for ongoing care management follow up and provided patient with direct contact information for care management team   Patient Goals/Self-Care Activities:  patient will:   - call for medicine refill 2 or 3 days before it runs out - call if I am sick and can't take my medicine - keep a list of all the medicines I take; vitamins and herbals too - learn to read medicine labels - use a pillbox to sort medicine - use an alarm clock or phone to remind me to take my medicine - change to whole grain breads, cereal,  pasta - drink 6 to 8 glasses of water each day - eat 3 to 5 servings of fruits and vegetables each day - eat 5 or 6 small meals each day - fill half the plate with nonstarchy vegetables - limit fast food meals to no more than 1 per week - manage portion size - prepare main meal at home 3 to 5 days each week - read food labels for fat, fiber, carbohydrates and portion size - be open to making changes - I can manage, know and watch for signs of a heart attack - if I have chest pain, call for help - learn about small changes that will make a big difference - learn my personal risk factors  - barriers to meeting goals identified - change-talk evoked - choices  provided - collaboration with team encouraged - decision-making supported - health risks reviewed - problem-solving facilitated - questions answered - readiness for change evaluated - reassurance provided - self-reflection promoted - self-reliance encouraged  Follow Up Plan: Telephone follow up appointment with care management team member scheduled for:09-17-2021 at [place] : RNCM: General Plan of Care (Adult) for Chronic Disease Management and Care Coordination Needs  Updates made by Vanita Ingles, RN since 09/17/2021 12:00 AM     Problem: RNCM: Development of plan of care for Chronic Disease Management (HTN, HLD, Depression, Anxiety, CP)   Priority: High     Long-Range Goal: RNCM: Effective Management  of plan of care for Chronic Disease Management (HTN, HLD, Depression, Anxiety, CP)   Start Date: 09/17/2021  Expected End Date: 09/17/2022  Priority: High  Note:   Current Barriers:  Knowledge Deficits related to plan of care for management of HTN, HLD, Anxiety with Excessive Worry, Social Anxiety, and Depression: depressed mood anxiety, and CP  Chronic Disease Management support and education needs related to HTN, HLD, Anxiety with Excessive Worry, Social Anxiety, and Depression: depressed mood anxiety, and Cerebral Palsy (CP)  RNCM Clinical Goal(s):  Patient will verbalize understanding of plan for management of HTN, HLD, Anxiety, Depression, and CP as evidenced by compliance with the plan of care, taking medications as directed, working with the CCM team to optimize health and well being  take all medications exactly as prescribed and will call provider for medication related questions as evidenced by compliance with medications, calling for refills before running out of medications, calling the office for changes in condition    demonstrate improved and ongoing adherence to prescribed treatment plan for HTN, HLD, Anxiety, Depression, and CP as evidenced by keeping  appointments and calling the office for changes in conditions or changes  demonstrate a decrease in HTN, HLD, Anxiety, Depression, and CP exacerbations  as evidenced by compliance with the plan of care, compliance with medications, compliance with dietary restrictions demonstrate ongoing self health care management ability for effective management of Chronic conditions  as evidenced by  working with the CCM team   through collaboration with Consulting civil engineer, provider, and care team.   Interventions: 1:1 collaboration with primary care provider regarding development and update of comprehensive plan of care as evidenced by provider attestation and co-signature Inter-disciplinary care team collaboration (see longitudinal plan of care) Evaluation of current treatment plan related to  self management and patient's adherence to plan as established by provider   SDOH Barriers (Status: Goal on Track (progressing): YES.) Long Term Goal  Patient interviewed and SDOH assessment performed  SDOH Interventions    Flowsheet Row Most Recent Value  SDOH Interventions   Food Insecurity Interventions Intervention Not Indicated  Financial Strain Interventions Intervention Not Indicated  Housing Interventions Intervention Not Indicated  Intimate Partner Violence Interventions Intervention Not Indicated  Physical Activity Interventions Other (Comments)  [limited ability to do activity due to chronic conditions and CP]  Social Connections Interventions Intervention Not Indicated, Other (Comment)  [has good support system]  Transportation Interventions Intervention Not Indicated     Patient interviewed and appropriate assessments performed Provided patient with information about resources for [facility] and care guides available to assist with new concerns or needs  Discussed plans with patient for ongoing care management follow up and provided patient with direct contact information for care  management team Advised patient to call the office for changes in SDOH, changes of concerns   Cerebral Palsy (CP)  (Status: Goal on Track (progressing): YES.) Long Term Goal  Evaluation of current treatment plan related to  CP ,  self-management and patient's adherence to plan as established by provider. Discussed plans with patient for ongoing care management follow up and provided patient with direct contact information for care management team Advised patient to call the office for changes in conditions, questions or concerns; Provided education to patient re: staying active, fall prevention and safety, nutritional status and improving appetite ; Reviewed medications with patient and discussed compliance; Reviewed scheduled/upcoming provider appointments including 12-15-2021 at 11 am; Discussed plans with patient for ongoing care management follow up and provided patient with direct contact information for care management team; Screening for signs and symptoms of depression related to chronic disease state;  Assessed social determinant of health barriers;    Depression and Anxiety   (Status: Goal on Track (progressing): YES.) Long Term Goal  Evaluation of current treatment plan related to Anxiety and Depression, Mental Health Concerns  self-management and patient's adherence to plan as established by provider. Discussed plans with patient for ongoing care management follow up and provided patient with direct contact information for care management team Advised patient to call the office for changes in mood, anxiety, and depression ; Provided education to patient re: doing things she enjoys, being around positive people, finding a hobby that works for her, safety in the home; Reviewed medications with patient and discussed compliance ; Reviewed scheduled/upcoming provider appointments including 12-15-2021 at 11 am; Discussed plans with patient for ongoing care management follow up and provided  patient with direct contact information for care management team; Screening for signs and symptoms of depression related to chronic disease state;   Hyperlipidemia:  (Status: Goal on Track (progressing): YES.) Lab Results  Component Value Date   CHOL 189 02/21/2021   HDL 56 02/21/2021   LDLCALC 108 (H) 02/21/2021   TRIG 142 02/21/2021   CHOLHDL 3.4 02/21/2021     Medication review performed; medication list updated in electronic medical record.  Provider established cholesterol goals reviewed; Counseled on importance of regular laboratory monitoring as prescribed; Provided HLD educational materials; Reviewed role and benefits of statin for ASCVD risk reduction; Discussed strategies to manage statin-induced myalgias; Reviewed importance of limiting foods high in cholesterol;  Hypertension: (Status: Goal on Track (progressing): YES.) Last practice recorded BP readings:  BP Readings from Last 3 Encounters:  09/16/21 128/80  06/24/21 123/75  06/11/21 125/80  Most recent eGFR/CrCl:  Lab Results  Component Value Date   EGFR 99 06/11/2021    No components found for: CRCL  Evaluation of current treatment plan  related to hypertension self management and patient's adherence to plan as established by provider;   Provided education to patient re: stroke prevention, s/s of heart attack and stroke; Reviewed prescribed diet heart healthy Reviewed medications with patient and discussed importance of compliance;  Discussed plans with patient for ongoing care management follow up and provided patient with direct contact information for care management team; Advised patient, providing education and rationale, to monitor blood pressure daily and record, calling PCP for findings outside established parameters;  Provided education on prescribed diet heart healthy;  Discussed complications of poorly controlled blood pressure such as heart disease, stroke, circulatory complications, vision  complications, kidney impairment, sexual dysfunction;   Patient Goals/Self-Care Activities: Patient will self administer medications as prescribed as evidenced by self report/primary caregiver report  Patient will attend all scheduled provider appointments as evidenced by clinician review of documented attendance to scheduled appointments and patient/caregiver report Patient will call pharmacy for medication refills as evidenced by patient report and review of pharmacy fill history as appropriate Patient will attend church or other social activities as evidenced by patient report Patient will call provider office for new concerns or questions as evidenced by review of documented incoming telephone call notes and patient report Patient will work with BSW to address care coordination needs and will continue to work with the clinical team to address health care and disease management related needs as evidenced by documented adherence to scheduled care management/care coordination appointments - check blood pressure 3 times per week - choose a place to take my blood pressure (home, clinic or office, retail store) - write blood pressure results in a log or diary - learn about high blood pressure - keep a blood pressure log - take blood pressure log to all doctor appointments - call doctor for signs and symptoms of high blood pressure - develop an action plan for high blood pressure - keep all doctor appointments - take medications for blood pressure exactly as prescribed - report new symptoms to your doctor - eat more whole grains, fruits and vegetables, lean meats and healthy fats - call for medicine refill 2 or 3 days before it runs out - take all medications exactly as prescribed - call doctor with any symptoms you believe are related to your medicine - call doctor when you experience any new symptoms - go to all doctor appointments as scheduled - adhere to prescribed diet: heart healthy         Plan:Telephone follow up appointment with care management team member scheduled for:  11-26-2021 at 1 pm  [place], MSN, [facility] Mobile: [phone]

## 2021-09-17 NOTE — Patient Instructions (Signed)
Visit Information  The patient verbalized understanding of instructions, educational materials, and care plan provided today and declined offer to receive copy of patient instructions, educational materials, and care plan.   Telephone follow up appointment with care management team member scheduled for: 11-26-2021 at 1 pm  Noreene Larsson RN, MSN, [facility] Mobile: [phone]

## 2021-09-22 ENCOUNTER — Telehealth: Payer: Self-pay | Admitting: Podiatry

## 2021-09-22 NOTE — Telephone Encounter (Signed)
Pts emergency contact left message stating pt was told she has an appt on Wednesday and now got a call stating it was on Thursday.   I returned call and explained that the appt for Wednesday is to pick up the brace and the appt that is for Thursday was a 3 month follow up with Dr Posey Pronto that was scheduled at her last appt in august. She stated she cannot do the appts on different days and I explained that Dr Posey Pronto is not in the [place] office on Wednesday.  I explained that if she wanted when she was in the office on Wednesday they could postpone the appt for Thursday until she has the brace for a few weeks as long as pt is not having any issues that need to be addressed.

## 2021-09-22 NOTE — Telephone Encounter (Signed)
They will probably r/s the appt for Thursday when they are in the office on Wednesday.

## 2021-09-24 ENCOUNTER — Other Ambulatory Visit: Payer: Self-pay

## 2021-09-24 ENCOUNTER — Other Ambulatory Visit: Payer: Medicare Other

## 2021-09-24 DIAGNOSIS — Z9181 History of falling: Secondary | ICD-10-CM | POA: Diagnosis not present

## 2021-09-24 DIAGNOSIS — M216X1 Other acquired deformities of right foot: Secondary | ICD-10-CM | POA: Diagnosis not present

## 2021-09-24 DIAGNOSIS — G801 Spastic diplegic cerebral palsy: Secondary | ICD-10-CM | POA: Diagnosis not present

## 2021-09-25 ENCOUNTER — Ambulatory Visit: Payer: Medicare Other | Admitting: Podiatry

## 2021-09-29 ENCOUNTER — Ambulatory Visit: Payer: Medicare Other

## 2021-09-29 DIAGNOSIS — F419 Anxiety disorder, unspecified: Secondary | ICD-10-CM

## 2021-09-29 DIAGNOSIS — E7800 Pure hypercholesterolemia, unspecified: Secondary | ICD-10-CM

## 2021-09-29 DIAGNOSIS — I10 Essential (primary) hypertension: Secondary | ICD-10-CM

## 2021-09-29 NOTE — Progress Notes (Signed)
Chronic Care Management Pharmacy Note  09/29/2021 Name:  Michelle Chandler MRN:  [id number] DOB:  [birth year]  Summary: No Rx changes   Subjective: Michelle Chandler is an 65 y.o. year old female who is a primary patient of Vigg, Avanti, MD.  The CCM team was consulted for assistance with disease management and care coordination needs.    Engaged with patient by telephone for follow up visit in response to provider referral for pharmacy case management and/or care coordination services.   Consent to Services:  The patient was given information about Chronic Care Management services, agreed to services, and gave verbal consent prior to initiation of services.  Please see initial visit note for detailed documentation.   Patient Care Team: Charlynne Cousins, MD as PCP - General Vanita Ingles, RN as Case Manager ([place]) Greg Cutter, LCSW as Social Worker (Licensed Clinical Social Worker)  Hospital visits: None in previous 6 months  Objective:  Lab Results  Component Value Date   CREATININE 0.64 06/11/2021   CREATININE 0.75 02/21/2021   CREATININE 0.69 07/11/2020    Lab Results  Component Value Date   HGBA1C 5.9 (H) 09/16/2021   HGBA1C 5.8 06/11/2021   HGBA1C 6.3 (H) 10/04/2020   Last diabetic Eye exam: No results found for: HMDIABEYEEXA  Last diabetic Foot exam: No results found for: HMDIABFOOTEX      Component Value Date/Time   CHOL 189 02/21/2021 0953   CHOL 170 10/04/2020 1103   CHOL 325 (H) 07/11/2020 1203   CHOL 231 (H) 08/16/2018 1049   CHOL 201 (H) 06/29/2017 1351   CHOL 159 06/23/2016 1326   TRIG 142 02/21/2021 0953   TRIG 110 10/04/2020 1103   TRIG 203 (H) 07/11/2020 1203   TRIG 266 (H) 08/16/2018 1049   TRIG 343 (H) 06/29/2017 1351   TRIG 175 (H) 06/23/2016 1326   HDL 56 02/21/2021 0953   HDL 48 10/04/2020 1103   HDL 44 07/11/2020 1203   CHOLHDL 3.4 02/21/2021 0953   VLDL 53 (H) 08/16/2018 1049   LDLCALC 108 (H) 02/21/2021 0953   LDLCALC 102 (H)  10/04/2020 1103   LDLCALC 240 (H) 07/11/2020 1203    Hepatic Function Latest Ref Rng & Units 06/11/2021 02/21/2021 07/11/2020  Total Protein 6.0 - 8.5 g/dL 6.9 7.1 7.0  Albumin 3.8 - 4.8 g/dL 4.1 4.4 4.3  AST 0 - 40 IU/L _0 ALT 0 - 32 IU/L _1 Alk Phosphatase 44 - 121 IU/L 107 121 110  Total Bilirubin 0.0 - 1.2 mg/dL 0.2 0.3 0.2    Lab Results  Component Value Date/Time   TSH 2.210 02/21/2021 09:53 AM   TSH 1.960 07/11/2020 12:03 PM    CBC Latest Ref Rng & Units 09/16/2021 06/11/2021 02/21/2021  WBC 3.4 - 10.8 x10E3/uL 8.3 9.7 7.5  Hemoglobin 11.1 - 15.9 g/dL 14.9 14.5 14.7  Hematocrit 34.0 - 46.6 % 43.5 43.5 42.8  Platelets 150 - 450 x10E3/uL - 414 359    No results found for: VD25OH  Clinical ASCVD:  The 10-year ASCVD risk score (Arnett DK, et al., 2019) is: 11%   Values used to calculate the score:     Age: 46 years     Sex: Female     Is Non-Hispanic African American: No     Diabetic: No     Tobacco smoker: Yes     Systolic Blood Pressure: 096 mmHg     Is BP treated: Yes  HDL Cholesterol: 56 mg/dL     Total Cholesterol: 189 mg/dL   Social History   Tobacco Use  Smoking Status Every Day   Packs/day: 0.50   Types: Cigarettes  Smokeless Tobacco Never   BP Readings from Last 3 Encounters:  09/16/21 128/80  06/24/21 123/75  06/11/21 125/80   Pulse Readings from Last 3 Encounters:  09/16/21 82  06/24/21 71  06/11/21 76   Wt Readings from Last 3 Encounters:  09/16/21 143 lb 9.6 oz (65.1 kg)  07/28/21 150 lb (68 kg)  03/12/21 143 lb 9.6 oz (65.1 kg)   BMI Readings from Last 3 Encounters:  09/16/21 28.19 kg/m  07/28/21 29.29 kg/m  06/11/21 28.19 kg/m    Assessment: Review of patient past medical history, allergies, medications, health status, including review of consultants reports, laboratory and other test data, was performed as part of comprehensive evaluation and provision of chronic care management services.   SDOH:  (Social  Determinants of Health) assessments and interventions performed: Yes   CCM Care Plan  Allergies  Allergen Reactions   No Known Allergies     Medications Reviewed Today     Reviewed by Madelin Rear, [facility] (Pharmacist) on 09/29/21 at 1527  Med List Status: <None>   Medication Order Taking? Sig Documenting Provider Last Dose Status Informant  atorvastatin (LIPITOR) 40 MG tablet [id number] Yes Take 1 tablet (40 mg total) by mouth daily. Vigg, Avanti, MD Taking Active   baclofen (LIORESAL) 10 MG tablet [id number]  Take 1 tablet (10 mg total) by mouth every evening. Vigg, Avanti, MD  Active   FLUoxetine (PROZAC) 20 MG capsule [id number]  Take 20 mg by mouth daily. [provider]  Active   hydrochlorothiazide (HYDRODIURIL) 25 MG tablet [id number]  Take 1 tablet (25 mg total) by mouth daily. Vigg, Avanti, MD  Active   hydrOXYzine (VISTARIL) 25 MG capsule [id number]  Take 1 capsule (25 mg total) by mouth every 8 (eight) hours as needed. Vigg, Avanti, MD  Active   ibandronate (BONIVA) 150 MG tablet [id number]  Take 1 tablet (150 mg total) by mouth every 30 (thirty) days. Take in the morning with a full glass of water, on an empty stomach, and do not take anything else by mouth or lie down for the next 30 min. Charlynne Cousins, MD  Active   ibuprofen (ADVIL) 400 MG tablet [id number]  Take 1 tablet (400 mg total) by mouth 3 (three) times daily as needed. Charlynne Cousins, MD  Active   Incontinence Supply Disposable (ENTRUST PLUS DISP UNDERPADS) MISC [id number]  by Does not apply route. [provider]  Active Self  Incontinence Supply Disposable (PROTECTIVE UNDERWEAR LARGE) MISC [id number]  by Does not apply route. [provider]  Active Self  omeprazole (PRILOSEC) 20 MG capsule [id number]  Take 1 capsule (20 mg total) by mouth daily. Eulogio Bear, NP  Active             Patient Active Problem List   Diagnosis Date Noted   Need for influenza vaccination 09/16/2021   Anxiety  06/12/2021   Osteoporosis 06/12/2021   Prediabetes 10/07/2020   Gastroesophageal reflux disease 06/10/2020   Smoking greater than 40 pack years 06/10/2020   Cerebral palsy ([place]) 12/27/2015   Hyperlipidemia 12/27/2015   Essential (primary) hypertension 12/27/2015   Depression, recurrent ([place]) 11/20/2015    Immunization History  Administered Date(s) Administered   Influenza,inj,Quad PF,6+ Mos 10/19/2017, 08/16/2018, 09/16/2021   Influenza-Unspecified 11/14/2014, 10/05/2016, 08/26/2020   PFIZER(Purple Top)SARS-COV-2 Vaccination  05/03/2020, 05/22/2020   Pneumococcal Polysaccharide-23 03/12/2021   Td 08/30/2007, 10/19/2017   Zoster Recombinat (Shingrix) 11/01/2020, 03/18/2021    Conditions to be addressed/monitored: HLD HTN MDD  Care Plan : ccm pharmacy care plan  Updates made by Madelin Rear, [facility] since 09/29/2021 12:00 AM     Problem: HLD MDD GAD HTN   Priority: High     Long-Range Goal: disease management   Start Date: 09/29/2021  This Visit's Progress: On track  Priority: High  Note:   Current Barriers:  Unable to achieve control of HLD - needing updated labs for statin    Pharmacist Clinical Goal(s):  Patient will verbalize ability to afford treatment regimen through collaboration with PharmD and provider.   Interventions: 1:1 collaboration with Charlynne Cousins, MD regarding development and update of comprehensive plan of care as evidenced by provider attestation and co-signature Inter-disciplinary care team collaboration (see longitudinal plan of care) Comprehensive medication review performed; medication list updated in electronic medical record  Hypertension (BP goal <130/80) -Controlled -Current treatment: Hydrochlorothiazide 25 mg once daily -Current home readings: 130s/70s -Current exercise habits: walking  -Denies hypotensive/hypertensive symptoms -Educated on BP goals and benefits of medications for prevention of heart attack, stroke and kidney  damage; -Counseled to monitor BP at home 1-2x/wk, document, and provide log at future appointments -Reviewed side effects   Hyperlipidemia: (LDL goal < 100) -Not ideally controlled (restarted 01/2021) -Current treatment: Atorvastatin 40 mg once daily  -Medications previously tried: simvastatin   -Current dietary patterns: see htn -Current exercise habits: see htn -Educated on Cholesterol goals;  Benefits of statin for ASCVD risk reduction; -Counseled on diet and exercise extensively Recommended to continue current medication Recommend updating lipid panel 11/2021 f/u  Depression/Anxiety (Goal: minimize symptoms) -Controlled -Current treatment: Fluoxetine 20 mg once daily Hydroxyzine 25 mg PRN -Medications previously tried/failed: n/a -PHQ9: 0 -GAD7: 0 -Declines needing any additional mental health support -Educated on Benefits of medication for symptom control -Recommended to continue current medication  Patient Goals/Self-Care Activities Patient will:  - take medications as prescribed as evidenced by patient report and record review  Medication Assistance: None required.  Patient affirms current coverage meets needs.  Patient's preferred pharmacy is:  [facility], [address] [place] [address] Phone: [phone] Fax: [facility] [address] [place] [address] Phone: [phone] Fax: [phone]  Pt endorses 100% compliance  Follow Up:  Patient agrees to Care Plan and Follow-up. Plan: HC - adherence call 3 months. If LDL <100, schedule f/u 03/2021, otherwise 09/2022 ok. Pharmacist 6-12 month f/u    Future Appointments  Date Time Provider [place]  11/26/2021  1:00 PM CFP CCM CASE MANAGER CFP-CFP [facility]  12/15/2021 11:00 AM Charlynne Cousins, MD CFP-CFP [facility]  07/31/2022 11:15 AM CFP NURSE HEALTH ADVISOR CFP-CFP [place], PharmD, [place]  Pharmacist  [phone]

## 2021-11-26 ENCOUNTER — Ambulatory Visit (INDEPENDENT_AMBULATORY_CARE_PROVIDER_SITE_OTHER): Payer: Medicare Other

## 2021-11-26 ENCOUNTER — Telehealth: Payer: Medicare Other

## 2021-11-26 DIAGNOSIS — I10 Essential (primary) hypertension: Secondary | ICD-10-CM

## 2021-11-26 DIAGNOSIS — E785 Hyperlipidemia, unspecified: Secondary | ICD-10-CM

## 2021-11-26 DIAGNOSIS — F339 Major depressive disorder, recurrent, unspecified: Secondary | ICD-10-CM

## 2021-11-26 DIAGNOSIS — G809 Cerebral palsy, unspecified: Secondary | ICD-10-CM

## 2021-11-26 DIAGNOSIS — E7800 Pure hypercholesterolemia, unspecified: Secondary | ICD-10-CM

## 2021-11-26 DIAGNOSIS — F419 Anxiety disorder, unspecified: Secondary | ICD-10-CM

## 2021-11-26 NOTE — Chronic Care Management (AMB) (Signed)
Chronic Care Management   CCM RN Visit Note  11/26/2021 Name: Michelle Chandler MRN: [id number] DOB: [birth year]  Subjective: Michelle Chandler is a 66 y.o. year old female who is a primary care patient of Vigg, Avanti, MD. The care management team was consulted for assistance with disease management and care coordination needs.    Engaged with patient by telephone for follow up visit in response to provider referral for case management and/or care coordination services.   Consent to Services:  The patient was given information about Chronic Care Management services, agreed to services, and gave verbal consent prior to initiation of services.  Please see initial visit note for detailed documentation.   Patient agreed to services and verbal consent obtained.   Assessment: Review of patient past medical history, allergies, medications, health status, including review of consultants reports, laboratory and other test data, was performed as part of comprehensive evaluation and provision of chronic care management services.   SDOH (Social Determinants of Health) assessments and interventions performed:    CCM Care Plan  Allergies  Allergen Reactions   No Known Allergies     Outpatient Encounter Medications as of 11/26/2021  Medication Sig   atorvastatin (LIPITOR) 40 MG tablet Take 1 tablet (40 mg total) by mouth daily.   FLUoxetine (PROZAC) 20 MG capsule Take 20 mg by mouth daily.   hydrochlorothiazide (HYDRODIURIL) 25 MG tablet Take 1 tablet (25 mg total) by mouth daily.   hydrOXYzine (VISTARIL) 25 MG capsule Take 1 capsule (25 mg total) by mouth every 8 (eight) hours as needed.   ibandronate (BONIVA) 150 MG tablet Take 1 tablet (150 mg total) by mouth every 30 (thirty) days. Take in the morning with a full glass of water, on an empty stomach, and do not take anything else by mouth or lie down for the next 30 min.   ibuprofen (ADVIL) 400 MG tablet Take 1 tablet (400 mg total) by mouth 3 (three) times  daily as needed.   Incontinence Supply Disposable (ENTRUST PLUS DISP UNDERPADS) MISC by Does not apply route.   Incontinence Supply Disposable (PROTECTIVE UNDERWEAR LARGE) MISC by Does not apply route.   omeprazole (PRILOSEC) 20 MG capsule Take 1 capsule (20 mg total) by mouth daily.   No facility-administered encounter medications on file as of 11/26/2021.    Patient Active Problem List   Diagnosis Date Noted   Need for influenza vaccination 09/16/2021   Anxiety 06/12/2021   Osteoporosis 06/12/2021   Prediabetes 10/07/2020   Gastroesophageal reflux disease 06/10/2020   Smoking greater than 40 pack years 06/10/2020   Cerebral palsy ([place]) 12/27/2015   Hyperlipidemia 12/27/2015   Essential (primary) hypertension 12/27/2015   Depression, recurrent ([place]) 11/20/2015    Conditions to be addressed/monitored:HTN, HLD, Anxiety, Depression, and CP  Care Plan : RNCM: General Plan of Care (Adult) for Chronic Disease Management and Care Coordination Needs  Updates made by Vanita Ingles, RN since 11/26/2021 12:00 AM     Problem: RNCM: Development of plan of care for Chronic Disease Management (HTN, HLD, Depression, Anxiety, CP)   Priority: High     Long-Range Goal: RNCM: Effective Management  of plan of care for Chronic Disease Management (HTN, HLD, Depression, Anxiety, CP)   Start Date: 09/17/2021  Expected End Date: 09/17/2022  Priority: High  Note:  Current Barriers:  Knowledge Deficits related to plan of care for management of HTN, HLD, Anxiety with Excessive Worry, Social Anxiety, and Depression: depressed mood anxiety, and CP  Chronic Disease Management support and education needs related to HTN, HLD, Anxiety with Excessive Worry, Social Anxiety, and Depression: depressed  mood anxiety, and Cerebral Palsy (CP)  RNCM Clinical Goal(s):  Patient will verbalize understanding of plan for management of HTN, HLD, Anxiety, Depression, and CP as evidenced by compliance with the plan of care, taking medications as directed, working with the CCM team to optimize health and well being  take all medications exactly as prescribed and will call provider for medication related questions as evidenced by compliance with medications, calling for refills before running out of medications, calling the office for changes in condition    demonstrate improved and ongoing adherence to prescribed treatment plan for HTN, HLD, Anxiety, Depression, and CP as evidenced by keeping appointments and calling the office for changes in conditions or changes  demonstrate a decrease in HTN, HLD, Anxiety, Depression, and CP exacerbations  as evidenced by compliance with the plan of care, compliance with medications, compliance with dietary restrictions demonstrate ongoing self health care management ability for effective management of Chronic conditions  as evidenced by  working with the CCM team   through collaboration with Consulting civil engineer, provider, and care team.   Interventions: 1:1 collaboration with primary care provider regarding development and update of comprehensive plan of care as evidenced by provider attestation and co-signature Inter-disciplinary care team collaboration (see longitudinal plan of care) Evaluation of current treatment plan related to  self management and patient's adherence to plan as established by provider   SDOH Barriers (Status: Goal on Track (progressing): YES.) Long Term Goal  Patient interviewed and SDOH assessment performed        SDOH Interventions    Flowsheet Row Most Recent Value  SDOH Interventions   Food Insecurity Interventions Intervention Not Indicated  Financial Strain Interventions Intervention Not Indicated  Housing Interventions Intervention Not  Indicated  Intimate Partner Violence Interventions Intervention Not Indicated  Physical Activity Interventions Other (Comments)  [limited ability to do activity due to chronic conditions and CP]  Social Connections Interventions Intervention Not Indicated, Other (Comment)  [has good support system]  Transportation Interventions Intervention Not Indicated     Patient interviewed and appropriate assessments performed Provided patient with information about resources for [facility] and care guides available to assist with new concerns or needs  Discussed plans with patient for ongoing care management follow up and provided patient with direct contact information for care management team Advised patient to call the office for changes in SDOH, changes of concerns   Cerebral Palsy (CP)  (Status: Goal on Track (progressing): YES.) Long Term Goal  Evaluation of current treatment plan related to  CP ,  self-management and patient's adherence to plan as established by provider. 11-26-2021: The patient has an aide that comes in 5 days a week to assist with bathing and general ADLS/IADLS. She feels like her CP is stable and denies any new concerns related to CP.  Discussed plans with patient for ongoing care management follow up and provided patient with direct contact information for care management team Advised patient to call the office for changes in conditions, questions or concerns; Provided education to patient re: staying active, fall prevention and safety, nutritional status and improving appetite ; Reviewed medications with patient and discussed compliance; Reviewed scheduled/upcoming provider appointments including 12-24-2021 at 11 am;  Discussed plans with patient for ongoing care management follow up and provided patient with direct contact information for care management team; Screening for signs and symptoms of depression related to chronic disease state;  Assessed social determinant of  health barriers;    Depression and Anxiety   (Status: Goal on Track (progressing): YES.) Long Term Goal  Evaluation of current treatment plan related to Anxiety and Depression, Mental Health Concerns  self-management and patient's adherence to plan as established by provider. 11-26-2021: The patient feels she is doing well. Denies any new needs or concerns with depression and anxiety. Says that she does have bad days sometimes but she works through those times. She denies any acute factors, needs, or questions, related to mental health Discussed plans with patient for ongoing care management follow up and provided patient with direct contact information for care management team Advised patient to call the office for changes in mood, anxiety, and depression ; Provided education to patient re: doing things she enjoys, being around positive people, finding a hobby that works for her, safety in the home; Reviewed medications with patient and discussed compliance ; Reviewed scheduled/upcoming provider appointments including 12-24-2021 at 11 am; Discussed plans with patient for ongoing care management follow up and provided patient with direct contact information for care management team; Screening for signs and symptoms of depression related to chronic disease state;   Hyperlipidemia:  (Status: Goal on Track (progressing): YES.) Lab Results  Component Value Date   CHOL 189 02/21/2021   HDL 56 02/21/2021   LDLCALC 108 (H) 02/21/2021   TRIG 142 02/21/2021   CHOLHDL 3.4 02/21/2021     Medication review performed; medication list updated in electronic medical record. 11-26-2021: The patient takes Lipitor 40 mg daily without difficulty. The patient denies any medication needs at this time Provider established cholesterol goals reviewed; Counseled on importance of regular laboratory monitoring as prescribed. 11-26-2021: Review of needed laboratory monitoring for effective management of HLD Provided HLD  educational materials; Reviewed role and benefits of statin for ASCVD risk reduction; Discussed strategies to manage statin-induced myalgias; Reviewed importance of limiting foods high in cholesterol. 11-26-2021: The patient denies any issues with dietary restrictions. She does not particularly watch what she eats. Education on the benefits of heart healthy diet  Hypertension: (Status: Goal on Track (progressing): YES.) Last practice recorded BP readings:  BP Readings from Last 3 Encounters:  09/16/21 128/80  06/24/21 123/75  06/11/21 125/80  Most recent eGFR/CrCl:  Lab Results  Component Value Date   EGFR 99 06/11/2021    No components found for: CRCL  Evaluation of current treatment plan related to hypertension self management and patient's adherence to plan as established by provider. 11-26-2021: The patient checks her blood pressures periodically. The patient denies any issues with HTN or heart health;   Provided education to patient re: stroke prevention, s/s of heart attack and stroke; Reviewed prescribed diet heart healthy Reviewed medications with patient and discussed importance of compliance. 11-26-2021: The patient states compliance with medications.  Discussed plans with patient for ongoing care management follow up and provided patient with direct contact information for care management team; Advised patient, providing education and rationale, to monitor blood pressure daily and record, calling PCP for findings outside established parameters;  Provided education on prescribed diet heart healthy;  Discussed complications of poorly controlled blood pressure such as heart disease, stroke, circulatory complications, vision complications, kidney impairment, sexual dysfunction;   Patient Goals/Self-Care Activities: Patient will self administer medications as prescribed  as evidenced by self report/primary caregiver report  Patient will attend all scheduled provider appointments as  evidenced by clinician review of documented attendance to scheduled appointments and patient/caregiver report Patient will call pharmacy for medication refills as evidenced by patient report and review of pharmacy fill history as appropriate Patient will attend church or other social activities as evidenced by patient report Patient will call provider office for new concerns or questions as evidenced by review of documented incoming telephone call notes and patient report Patient will work with BSW to address care coordination needs and will continue to work with the clinical team to address health care and disease management related needs as evidenced by documented adherence to scheduled care management/care coordination appointments - check blood pressure 3 times per week - choose a place to take my blood pressure (home, clinic or office, retail store) - write blood pressure results in a log or diary - learn about high blood pressure - keep a blood pressure log - take blood pressure log to all doctor appointments - call doctor for signs and symptoms of high blood pressure - develop an action plan for high blood pressure - keep all doctor appointments - take medications for blood pressure exactly as prescribed - report new symptoms to your doctor - eat more whole grains, fruits and vegetables, lean meats and healthy fats - call for medicine refill 2 or 3 days before it runs out - take all medications exactly as prescribed - call doctor with any symptoms you believe are related to your medicine - call doctor when you experience any new symptoms - go to all doctor appointments as scheduled - adhere to prescribed diet: heart healthy        Plan:Telephone follow up appointment with care management team member scheduled for:  02-04-2022 at 1 pm  [place], MSN, [facility] Mobile: [phone]

## 2021-11-26 NOTE — Patient Instructions (Signed)
Visit Information  Thank you for taking time to visit with me today. Please don't hesitate to contact me if I can be of assistance to you before our next scheduled telephone appointment.  Following are the goals we discussed today:  RNCM Clinical Goal(s):  Patient will verbalize understanding of plan for management of HTN, HLD, Anxiety, Depression, and CP as evidenced by compliance with the plan of care, taking medications as directed, working with the CCM team to optimize health and well being  take all medications exactly as prescribed and will call provider for medication related questions as evidenced by compliance with medications, calling for refills before running out of medications, calling the office for changes in condition    demonstrate improved and ongoing adherence to prescribed treatment plan for HTN, HLD, Anxiety, Depression, and CP as evidenced by keeping appointments and calling the office for changes in conditions or changes  demonstrate a decrease in HTN, HLD, Anxiety, Depression, and CP exacerbations  as evidenced by compliance with the plan of care, compliance with medications, compliance with dietary restrictions demonstrate ongoing self health care management ability for effective management of Chronic conditions  as evidenced by  working with the CCM team   through collaboration with Consulting civil engineer, provider, and care team.    Interventions: 1:1 collaboration with primary care provider regarding development and update of comprehensive plan of care as evidenced by provider attestation and co-signature Inter-disciplinary care team collaboration (see longitudinal plan of care) Evaluation of current treatment plan related to  self management and patient's adherence to plan as established by provider     SDOH Barriers (Status: Goal on Track (progressing): YES.) Long Term Goal  Patient interviewed and SDOH assessment performed        SDOH Interventions     Flowsheet Row  Most Recent Value  SDOH Interventions    Food Insecurity Interventions Intervention Not Indicated  Financial Strain Interventions Intervention Not Indicated  Housing Interventions Intervention Not Indicated  Intimate Partner Violence Interventions Intervention Not Indicated  Physical Activity Interventions Other (Comments)  [limited ability to do activity due to chronic conditions and CP]  Social Connections Interventions Intervention Not Indicated, Other (Comment)  [has good support system]  Transportation Interventions Intervention Not Indicated       Patient interviewed and appropriate assessments performed Provided patient with information about resources for [facility] and care guides available to assist with new concerns or needs  Discussed plans with patient for ongoing care management follow up and provided patient with direct contact information for care management team Advised patient to call the office for changes in SDOH, changes of concerns     Cerebral Palsy (CP)  (Status: Goal on Track (progressing): YES.) Long Term Goal  Evaluation of current treatment plan related to  CP ,  self-management and patient's adherence to plan as established by provider. 11-26-2021: The patient has an aide that comes in 5 days a week to assist with bathing and general ADLS/IADLS. She feels like her CP is stable and denies any new concerns related to CP.  Discussed plans with patient for ongoing care management follow up and provided patient with direct contact information for care management team Advised patient to call the office for changes in conditions, questions or concerns; Provided education to patient re: staying active, fall prevention and safety, nutritional status and improving appetite ; Reviewed medications with patient and discussed compliance; Reviewed scheduled/upcoming provider appointments including 12-24-2021 at 11 am; Discussed plans with patient for ongoing  care management  follow up and provided patient with direct contact information for care management team; Screening for signs and symptoms of depression related to chronic disease state;  Assessed social determinant of health barriers;     Depression and Anxiety   (Status: Goal on Track (progressing): YES.) Long Term Goal  Evaluation of current treatment plan related to Anxiety and Depression, Mental Health Concerns  self-management and patient's adherence to plan as established by provider. 11-26-2021: The patient feels she is doing well. Denies any new needs or concerns with depression and anxiety. Says that she does have bad days sometimes but she works through those times. She denies any acute factors, needs, or questions, related to mental health Discussed plans with patient for ongoing care management follow up and provided patient with direct contact information for care management team Advised patient to call the office for changes in mood, anxiety, and depression ; Provided education to patient re: doing things she enjoys, being around positive people, finding a hobby that works for her, safety in the home; Reviewed medications with patient and discussed compliance ; Reviewed scheduled/upcoming provider appointments including 12-24-2021 at 11 am; Discussed plans with patient for ongoing care management follow up and provided patient with direct contact information for care management team; Screening for signs and symptoms of depression related to chronic disease state;    Hyperlipidemia:  (Status: Goal on Track (progressing): YES.)      Lab Results  Component Value Date    CHOL 189 02/21/2021    HDL 56 02/21/2021    LDLCALC 108 (H) 02/21/2021    TRIG 142 02/21/2021    CHOLHDL 3.4 02/21/2021      Medication review performed; medication list updated in electronic medical record. 11-26-2021: The patient takes Lipitor 40 mg daily without difficulty. The patient denies any medication needs at this  time Provider established cholesterol goals reviewed; Counseled on importance of regular laboratory monitoring as prescribed. 11-26-2021: Review of needed laboratory monitoring for effective management of HLD Provided HLD educational materials; Reviewed role and benefits of statin for ASCVD risk reduction; Discussed strategies to manage statin-induced myalgias; Reviewed importance of limiting foods high in cholesterol. 11-26-2021: The patient denies any issues with dietary restrictions. She does not particularly watch what she eats. Education on the benefits of heart healthy diet   Hypertension: (Status: Goal on Track (progressing): YES.) Last practice recorded BP readings:     BP Readings from Last 3 Encounters:  09/16/21 128/80  06/24/21 123/75  06/11/21 125/80  Most recent eGFR/CrCl:       Lab Results  Component Value Date    EGFR 99 06/11/2021    No components found for: CRCL   Evaluation of current treatment plan related to hypertension self management and patient's adherence to plan as established by provider. 11-26-2021: The patient checks her blood pressures periodically. The patient denies any issues with HTN or heart health;   Provided education to patient re: stroke prevention, s/s of heart attack and stroke; Reviewed prescribed diet heart healthy Reviewed medications with patient and discussed importance of compliance. 11-26-2021: The patient states compliance with medications.  Discussed plans with patient for ongoing care management follow up and provided patient with direct contact information for care management team; Advised patient, providing education and rationale, to monitor blood pressure daily and record, calling PCP for findings outside established parameters;  Provided education on prescribed diet heart healthy;  Discussed complications of poorly controlled blood pressure such as heart disease, stroke, circulatory complications,  vision complications, kidney  impairment, sexual dysfunction;    Patient Goals/Self-Care Activities: Patient will self administer medications as prescribed as evidenced by self report/primary caregiver report  Patient will attend all scheduled provider appointments as evidenced by clinician review of documented attendance to scheduled appointments and patient/caregiver report Patient will call pharmacy for medication refills as evidenced by patient report and review of pharmacy fill history as appropriate Patient will attend church or other social activities as evidenced by patient report Patient will call provider office for new concerns or questions as evidenced by review of documented incoming telephone call notes and patient report Patient will work with BSW to address care coordination needs and will continue to work with the clinical team to address health care and disease management related needs as evidenced by documented adherence to scheduled care management/care coordination appointments - check blood pressure 3 times per week - choose a place to take my blood pressure (home, clinic or office, retail store) - write blood pressure results in a log or diary - learn about high blood pressure - keep a blood pressure log - take blood pressure log to all doctor appointments - call doctor for signs and symptoms of high blood pressure - develop an action plan for high blood pressure - keep all doctor appointments - take medications for blood pressure exactly as prescribed - report new symptoms to your doctor - eat more whole grains, fruits and vegetables, lean meats and healthy fats - call for medicine refill 2 or 3 days before it runs out - take all medications exactly as prescribed - call doctor with any symptoms you believe are related to your medicine - call doctor when you experience any new symptoms - go to all doctor appointments as scheduled - adhere to prescribed diet: heart healthy       Our next  appointment is by telephone on 02-04-2022 at 1 pm  Please call the care guide team at [phone] if you need to cancel or reschedule your appointment.   If you are experiencing a Mental Health or [place] or need someone to talk to, please call the Suicide and Crisis Lifeline: 988 call the [place] National Suicide Prevention Lifeline: 912-117-7639 or TTY: 5620527466 TTY 973-527-2004) to talk to a trained counselor call 1-800-273-TALK (toll free, 24 hour hotline)   The patient verbalized understanding of instructions, educational materials, and care plan provided today and declined offer to receive copy of patient instructions, educational materials, and care plan.   Noreene Larsson RN, MSN, [facility] Mobile: [phone]

## 2021-11-27 ENCOUNTER — Other Ambulatory Visit: Payer: Self-pay | Admitting: Internal Medicine

## 2021-11-27 ENCOUNTER — Other Ambulatory Visit: Payer: Self-pay | Admitting: Nurse Practitioner

## 2021-11-27 DIAGNOSIS — I10 Essential (primary) hypertension: Secondary | ICD-10-CM

## 2021-11-27 NOTE — Telephone Encounter (Signed)
Requested medication (s) are due for refill today: amount not specified  Requested medication (s) are on the active medication list: yes    Last refill: 03/12/21  Future visit scheduled yes 12/24/21  Notes to clinic:historical provider  Requested Prescriptions  Pending Prescriptions Disp Refills   FLUoxetine (PROZAC) 20 MG capsule [Pharmacy Med Name: FLUOXETINE HCL 20 MG CAPSULE] 90 capsule 0    Sig: Take 1 capsule (20 mg total) by mouth daily.     Psychiatry:  Antidepressants - SSRI Passed - 11/27/2021  4:55 PM      Passed - Completed PHQ-2 or PHQ-9 in the last 360 days      Passed - Valid encounter within last 6 months    Recent Outpatient Visits           2 months ago Prediabetes   [place] Vigg, Avanti, MD   5 months ago Prediabetes   [facility] Vigg, Avanti, MD   8 months ago H/O transfusion   [place] Vigg, Avanti, MD   10 months ago Essential (primary) hypertension   [place] Vigg, Avanti, MD   1 year ago Depression, recurrent ([place])   [place], Jessica A, NP       Future Appointments             In 3 weeks Vigg, Avanti, MD [facility], [facility]   In 8 months  [facility], [place]

## 2021-11-27 NOTE — Telephone Encounter (Signed)
Requested Prescriptions  Pending Prescriptions Disp Refills   atorvastatin (LIPITOR) 40 MG tablet [Pharmacy Med Name: ATORVASTATIN 40 MG TABLET] 90 tablet 0    Sig: Take 1 tablet (40 mg total) by mouth daily.     Cardiovascular:  Antilipid - Statins Failed - 11/27/2021 12:25 PM      Failed - LDL in normal range and within 360 days    LDL Chol Calc (NIH)  Date Value Ref Range Status  02/21/2021 108 (H) 0 - 99 mg/dL Final         Passed - Total Cholesterol in normal range and within 360 days    Cholesterol, Total  Date Value Ref Range Status  02/21/2021 189 100 - 199 mg/dL Final   Cholesterol Piccolo, Waived  Date Value Ref Range Status  08/16/2018 231 (H) <200 mg/dL Final    Comment:                            Desirable                <200                         Borderline High      200- 239                         High                     >239          Passed - HDL in normal range and within 360 days    HDL  Date Value Ref Range Status  02/21/2021 56 >39 mg/dL Final         Passed - Triglycerides in normal range and within 360 days    Triglycerides  Date Value Ref Range Status  02/21/2021 142 0 - 149 mg/dL Final   Triglycerides Piccolo,Waived  Date Value Ref Range Status  08/16/2018 266 (H) <150 mg/dL Final    Comment:                            Normal                   <150                         Borderline High     150 - 199                         High                200 - 499                         Very High                >499          Passed - Patient is not pregnant      Passed - Valid encounter within last 12 months    Recent Outpatient Visits          2 months ago Prediabetes   [place] Vigg, Avanti, MD   5 months ago Prediabetes   [facility] Vigg, Avanti, MD   8  months ago H/O transfusion   [place] Vigg, Avanti, MD   10 months ago Essential (primary) hypertension   [place] Vigg,  Avanti, MD   1 year ago Depression, recurrent ([place])   [place] Eulogio Bear, NP      Future Appointments            In 3 weeks Vigg, Avanti, MD [facility]   In 8 months  [place], [facility]            hydrochlorothiazide (HYDRODIURIL) 25 MG tablet [Pharmacy Med Name: HYDROCHLOROTHIAZIDE 25 MG TAB] 90 tablet 0    Sig: Take 1 tablet (25 mg total) by mouth daily.     Cardiovascular: Diuretics - Thiazide Passed - 11/27/2021 12:25 PM      Passed - Ca in normal range and within 360 days    Calcium  Date Value Ref Range Status  06/11/2021 9.8 8.7 - 10.3 mg/dL Final         Passed - Cr in normal range and within 360 days    Creatinine, Ser  Date Value Ref Range Status  06/11/2021 0.64 0.57 - 1.00 mg/dL Final         Passed - K in normal range and within 360 days    Potassium  Date Value Ref Range Status  06/11/2021 3.9 3.5 - 5.2 mmol/L Final         Passed - Na in normal range and within 360 days    Sodium  Date Value Ref Range Status  06/11/2021 137 134 - 144 mmol/L Final         Passed - Last BP in normal range    BP Readings from Last 1 Encounters:  09/16/21 128/80         Passed - Valid encounter within last 6 months    Recent Outpatient Visits          2 months ago Prediabetes   [facility] Vigg, Avanti, MD   5 months ago Prediabetes   [facility] Vigg, Avanti, MD   8 months ago H/O transfusion   [place] Vigg, Avanti, MD   10 months ago Essential (primary) hypertension   [facility] Vigg, Avanti, MD   1 year ago Depression, recurrent ([place])   [place], Jessica A, NP      Future Appointments            In 3 weeks Vigg, Avanti, MD [facility], [facility]   In 8 months  [facility], [place]

## 2021-12-02 ENCOUNTER — Other Ambulatory Visit: Payer: Self-pay | Admitting: Nurse Practitioner

## 2021-12-03 MED ORDER — OMEPRAZOLE 20 MG PO CPDR: 20.0000 mg | DELAYED_RELEASE_CAPSULE | Freq: Every day | ORAL | 1 refills | Status: DC

## 2021-12-15 ENCOUNTER — Ambulatory Visit: Payer: Medicare Other | Admitting: Internal Medicine

## 2021-12-16 DIAGNOSIS — F339 Major depressive disorder, recurrent, unspecified: Secondary | ICD-10-CM | POA: Diagnosis not present

## 2021-12-16 DIAGNOSIS — E7800 Pure hypercholesterolemia, unspecified: Secondary | ICD-10-CM

## 2021-12-16 DIAGNOSIS — E785 Hyperlipidemia, unspecified: Secondary | ICD-10-CM | POA: Diagnosis not present

## 2021-12-16 DIAGNOSIS — I10 Essential (primary) hypertension: Secondary | ICD-10-CM

## 2021-12-24 ENCOUNTER — Encounter: Payer: Self-pay | Admitting: Internal Medicine

## 2021-12-24 ENCOUNTER — Ambulatory Visit (INDEPENDENT_AMBULATORY_CARE_PROVIDER_SITE_OTHER): Payer: Medicare Other | Admitting: Internal Medicine

## 2021-12-24 ENCOUNTER — Other Ambulatory Visit: Payer: Self-pay

## 2021-12-24 VITALS — BP 127/72 | HR 85 | Temp 98.7°F | Ht 59.84 in | Wt 143.0 lb

## 2021-12-24 DIAGNOSIS — E119 Type 2 diabetes mellitus without complications: Secondary | ICD-10-CM | POA: Diagnosis not present

## 2021-12-24 DIAGNOSIS — E7800 Pure hypercholesterolemia, unspecified: Secondary | ICD-10-CM

## 2021-12-24 DIAGNOSIS — G801 Spastic diplegic cerebral palsy: Secondary | ICD-10-CM | POA: Diagnosis not present

## 2021-12-24 DIAGNOSIS — M810 Age-related osteoporosis without current pathological fracture: Secondary | ICD-10-CM

## 2021-12-24 LAB — MICROSCOPIC EXAMINATION

## 2021-12-24 LAB — URINALYSIS, ROUTINE W REFLEX MICROSCOPIC
Bilirubin, UA: NEGATIVE
Glucose, UA: NEGATIVE
Ketones, UA: NEGATIVE
Nitrite, UA: POSITIVE — AB
Protein,UA: NEGATIVE
Specific Gravity, UA: 1.02 (ref 1.005–1.030)
Urobilinogen, Ur: 0.2 mg/dL (ref 0.2–1.0)
pH, UA: 7 (ref 5.0–7.5)

## 2021-12-24 LAB — BAYER DCA HB A1C WAIVED: HB A1C (BAYER DCA - WAIVED): 6.1 % — ABNORMAL HIGH (ref 4.8–5.6)

## 2021-12-24 MED ORDER — SULFAMETHOXAZOLE-TRIMETHOPRIM 800-160 MG PO TABS: 1.0000 | ORAL_TABLET | Freq: Two times a day (BID) | ORAL | 0 refills | Status: AC

## 2021-12-24 MED ORDER — FAMOTIDINE 20 MG PO TABS: 20.0000 mg | ORAL_TABLET | Freq: Two times a day (BID) | ORAL | 4 refills | Status: DC

## 2021-12-24 NOTE — Progress Notes (Signed)
Pl call pt she has a Uti she didn't co this to me at this visit see if she is symptomatic, senidn in an abx,

## 2021-12-24 NOTE — Patient Instructions (Signed)
Please call to schedule your mammogram and/or bone density: °[facility]  °Address: [address], [place], [place] [address]  °Phone: [phone] ° °

## 2021-12-24 NOTE — Progress Notes (Addendum)
BP 127/72    Pulse 85    Temp 98.7 F (37.1 C) (Oral)    Ht 4' 11.84" (1.52 m)    Wt 143 lb (64.9 kg)    SpO2 98%    BMI 28.07 kg/m    Subjective:    Patient ID: Michelle Chandler, female    DOB: [birth year], 66 y.o.   MRN: [id number]  Chief Complaint  Patient presents with   Prediabetes   Hyperlipidemia   Cerebral Palsy    HPI: Michelle Chandler is a 66 y.o. female  Needs a lift chair for her house. Osetoporosis is on boniva for such didn't have her mammo or her DEXA scan last visit.   Hyperlipidemia This is a chronic problem. Pertinent negatives include no chest pain.  Gastroesophageal Reflux She complains of heartburn. She reports no abdominal pain, no belching, no chest pain, no early satiety or no globus sensation. is on omeprazole for such.   Chief Complaint  Patient presents with   Prediabetes   Hyperlipidemia   Cerebral Palsy    Relevant past medical, surgical, family and social history reviewed and updated as indicated. Interim medical history since our last visit reviewed. Allergies and medications reviewed and updated.  Review of Systems  Cardiovascular:  Negative for chest pain.  Gastrointestinal:  Positive for heartburn. Negative for abdominal pain.   Per HPI unless specifically indicated above     Objective:    BP 127/72    Pulse 85    Temp 98.7 F (37.1 C) (Oral)    Ht 4' 11.84" (1.52 m)    Wt 143 lb (64.9 kg)    SpO2 98%    BMI 28.07 kg/m   Wt Readings from Last 3 Encounters:  12/24/21 143 lb (64.9 kg)  09/16/21 143 lb 9.6 oz (65.1 kg)  07/28/21 150 lb (68 kg)    Physical Exam Vitals and nursing note reviewed.  Constitutional:      General: She is not in acute distress.    Appearance: Normal appearance. She is not ill-appearing or diaphoretic.  Eyes:     Conjunctiva/sclera: Conjunctivae normal.  Cardiovascular:     Rate and Rhythm: Normal rate and regular rhythm.     Heart sounds: No murmur heard. Pulmonary:     Effort: No respiratory distress.      Breath sounds: No stridor. No wheezing, rhonchi or rales.  Abdominal:     General: Abdomen is flat. Bowel sounds are normal. There is no distension.     Palpations: Abdomen is soft. There is no mass.     Tenderness: There is no abdominal tenderness. There is no guarding.  Musculoskeletal:        General: No swelling, tenderness or deformity.  Skin:    General: Skin is warm and dry.     Coloration: Skin is not jaundiced.     Findings: No erythema.  Neurological:     Mental Status: She is alert.   Results for orders placed or performed in visit on 12/24/21  Microscopic Examination   Urine  Result Value Ref Range   WBC, UA 11-30 (A) 0 - 5 /hpf   RBC 3-10 (A) 0 - 2 /hpf   Epithelial Cells (non renal) 0-10 0 - 10 /hpf   Mucus, UA Present (A) Not Estab.   Bacteria, UA Many (A) None seen/Few  Urinalysis, Routine w reflex microscopic  Result Value Ref Range   Specific Gravity, UA 1.020 1.005 - 1.030   pH, UA 7.0  5.0 - 7.5   Color, UA Yellow Yellow   Appearance Ur Cloudy (A) Clear   Leukocytes,UA 1+ (A) Negative   Protein,UA Negative Negative/Trace   Glucose, UA Negative Negative   Ketones, UA Negative Negative   RBC, UA 2+ (A) Negative   Bilirubin, UA Negative Negative   Urobilinogen, Ur 0.2 0.2 - 1.0 mg/dL   Nitrite, UA Positive (A) Negative   Microscopic Examination See below:   Bayer DCA Hb A1c Waived (STAT)  Result Value Ref Range   HB A1C (BAYER DCA - WAIVED) 6.1 (H) 4.8 - 5.6 %        Current Outpatient Medications:    atorvastatin (LIPITOR) 40 MG tablet, Take 1 tablet (40 mg total) by mouth daily., Disp: 90 tablet, Rfl: 0   famotidine (PEPCID) 20 MG tablet, Take 1 tablet (20 mg total) by mouth 2 (two) times daily., Disp: 30 tablet, Rfl: 4   FLUoxetine (PROZAC) 20 MG capsule, Take 1 capsule (20 mg total) by mouth daily., Disp: 90 capsule, Rfl: 0   hydrochlorothiazide (HYDRODIURIL) 25 MG tablet, Take 1 tablet (25 mg total) by mouth daily., Disp: 90 tablet, Rfl: 0    hydrOXYzine (VISTARIL) 25 MG capsule, Take 1 capsule (25 mg total) by mouth every 8 (eight) hours as needed., Disp: 30 capsule, Rfl: 1   ibandronate (BONIVA) 150 MG tablet, Take 1 tablet (150 mg total) by mouth every 30 (thirty) days. Take in the morning with a full glass of water, on an empty stomach, and do not take anything else by mouth or lie down for the next 30 min., Disp: 12 tablet, Rfl: 1   ibuprofen (ADVIL) 400 MG tablet, Take 1 tablet (400 mg total) by mouth 3 (three) times daily as needed., Disp: 90 tablet, Rfl: 0   Incontinence Supply Disposable (ENTRUST PLUS DISP UNDERPADS) MISC, by Does not apply route., Disp: , Rfl:    Incontinence Supply Disposable (PROTECTIVE UNDERWEAR LARGE) MISC, by Does not apply route., Disp: , Rfl:    sulfamethoxazole-trimethoprim (BACTRIM DS) 800-160 MG tablet, Take 1 tablet by mouth 2 (two) times daily for 5 days., Disp: 10 tablet, Rfl: 0   baclofen (LIORESAL) 10 MG tablet, Take 10 mg by mouth daily., Disp: , Rfl:     Assessment & Plan:  GERD  Change to famotidine sec to osteoporosis in this pt  patient advised to avoid laying down soon after his meals. He took a 2 hours between dinner and bedtime. Avoid spicy food and triggers that he knows food wise that worsen his acid reflux. Patient verbalized understanding of the above. Lifestyle modifications as above discussed with patient.  2. Osteoporosis is on boniva for such once a month OSTEOPOROSIS patient advised to take this once a month on empty stomach needs to sit upright for an hour after  ake this drug by mouth with a full glass of water. Take it as directed on the prescription label on the same day of each month.Take the dose right after waking up. Do not eat or drink anything before taking it. Do not take it with any other drink except water. Do not chew or crush the tablet. After taking it, do not eat breakfast, drink, or take any other drugs or vitamins for at least 30 minutes. Sit or stand up for at  least 60 minutes after you take it. Do notlie down. Keep taking it unless your health care provider tells you to stop.  Problem List Items Addressed This Visit  Nervous and Auditory   Cerebral palsy (HCC)     Musculoskeletal and Integument   Osteoporosis     Other   Hyperlipidemia   Other Visit Diagnoses     Diabetes mellitus without complication ([place])    -  Primary   Relevant Orders   CBC with Differential/Platelet   Comprehensive metabolic panel   Lipid panel   Urinalysis, Routine w reflex microscopic (Completed)   TSH   Bayer DCA Hb A1c Waived (STAT) (Completed)        Orders Placed This Encounter  Procedures   Microscopic Examination   CBC with Differential/Platelet   Comprehensive metabolic panel   Lipid panel   Urinalysis, Routine w reflex microscopic   TSH   Bayer DCA Hb A1c Waived (STAT)     Meds ordered this encounter  Medications   famotidine (PEPCID) 20 MG tablet    Sig: Take 1 tablet (20 mg total) by mouth 2 (two) times daily.    Dispense:  30 tablet    Refill:  4   sulfamethoxazole-trimethoprim (BACTRIM DS) 800-160 MG tablet    Sig: Take 1 tablet by mouth 2 (two) times daily for 5 days.    Dispense:  10 tablet    Refill:  0     Follow up plan: Return in about 3 months (around 03/23/2022).

## 2021-12-24 NOTE — Addendum Note (Signed)
Addended by: Charlynne Cousins on: 12/24/2021 12:18 PM   Modules accepted: Orders

## 2021-12-25 ENCOUNTER — Telehealth: Payer: Self-pay | Admitting: Internal Medicine

## 2021-12-25 LAB — CBC WITH DIFFERENTIAL/PLATELET
Basophils Absolute: 0.1 10*3/uL (ref 0.0–0.2)
Basos: 1 %
EOS (ABSOLUTE): 0.2 10*3/uL (ref 0.0–0.4)
Eos: 2 %
Hematocrit: 47.1 % — ABNORMAL HIGH (ref 34.0–46.6)
Hemoglobin: 15.4 g/dL (ref 11.1–15.9)
Immature Grans (Abs): 0 10*3/uL (ref 0.0–0.1)
Immature Granulocytes: 0 %
Lymphocytes Absolute: 1.8 10*3/uL (ref 0.7–3.1)
Lymphs: 22 %
MCH: 31.3 pg (ref 26.6–33.0)
MCHC: 32.7 g/dL (ref 31.5–35.7)
MCV: 96 fL (ref 79–97)
Monocytes Absolute: 0.5 10*3/uL (ref 0.1–0.9)
Monocytes: 7 %
Neutrophils Absolute: 5.7 10*3/uL (ref 1.4–7.0)
Neutrophils: 68 %
Platelets: 386 10*3/uL (ref 150–450)
RBC: 4.92 x10E6/uL (ref 3.77–5.28)
RDW: 11.9 % (ref 11.7–15.4)
WBC: 8.2 10*3/uL (ref 3.4–10.8)

## 2021-12-25 LAB — COMPREHENSIVE METABOLIC PANEL
ALT: 17 IU/L (ref 0–32)
AST: 17 IU/L (ref 0–40)
Albumin/Globulin Ratio: 1.6 (ref 1.2–2.2)
Albumin: 4.5 g/dL (ref 3.8–4.8)
Alkaline Phosphatase: 132 IU/L — ABNORMAL HIGH (ref 44–121)
BUN/Creatinine Ratio: 16 (ref 12–28)
BUN: 11 mg/dL (ref 8–27)
Bilirubin Total: 0.3 mg/dL (ref 0.0–1.2)
CO2: 23 mmol/L (ref 20–29)
Calcium: 10.3 mg/dL (ref 8.7–10.3)
Chloride: 95 mmol/L — ABNORMAL LOW (ref 96–106)
Creatinine, Ser: 0.67 mg/dL (ref 0.57–1.00)
Globulin, Total: 2.8 g/dL (ref 1.5–4.5)
Glucose: 97 mg/dL (ref 70–99)
Potassium: 3.8 mmol/L (ref 3.5–5.2)
Sodium: 138 mmol/L (ref 134–144)
Total Protein: 7.3 g/dL (ref 6.0–8.5)
eGFR: 98 mL/min/{1.73_m2} (ref >59)

## 2021-12-25 LAB — LIPID PANEL
Chol/HDL Ratio: 3.4 ratio (ref 0.0–4.4)
Cholesterol, Total: 162 mg/dL (ref 100–199)
HDL: 48 mg/dL (ref >39)
LDL Chol Calc (NIH): 85 mg/dL (ref 0–99)
Triglycerides: 172 mg/dL — ABNORMAL HIGH (ref 0–149)
VLDL Cholesterol Cal: 29 mg/dL (ref 5–40)

## 2021-12-25 LAB — TSH: TSH: 1.39 u[IU]/mL (ref 0.450–4.500)

## 2021-12-25 NOTE — Telephone Encounter (Signed)
Michelle Chandler the pts Cap worker called and needs a note from the provider stating why the pt needs a lift chair as well as the Rx for the lift chair so medicaid can pay it / please advise fax# [phone] Attention: Robert Linens   The store is holding the pts lift chair currently so they would like the letter and RX asap

## 2021-12-25 NOTE — Telephone Encounter (Signed)
Pl see if we can get this letter to them today thnx.

## 2021-12-26 NOTE — Telephone Encounter (Signed)
Letter and prescription has been faxed to [employer] at [phone]

## 2021-12-30 ENCOUNTER — Other Ambulatory Visit: Payer: Self-pay | Admitting: Internal Medicine

## 2021-12-30 DIAGNOSIS — G801 Spastic diplegic cerebral palsy: Secondary | ICD-10-CM

## 2021-12-30 DIAGNOSIS — E7800 Pure hypercholesterolemia, unspecified: Secondary | ICD-10-CM

## 2021-12-30 DIAGNOSIS — E119 Type 2 diabetes mellitus without complications: Secondary | ICD-10-CM

## 2021-12-30 DIAGNOSIS — M810 Age-related osteoporosis without current pathological fracture: Secondary | ICD-10-CM

## 2021-12-31 NOTE — Telephone Encounter (Signed)
Requested medications are due for refill today.  unknown  Requested medications are on the active medications list.  yes  Last refill. 11/27/2021  Future visit scheduled.   yes  Notes to clinic.  Rx written by historical provider.    Requested Prescriptions  Pending Prescriptions Disp Refills   baclofen (LIORESAL) 10 MG tablet [Pharmacy Med Name: BACLOFEN 10 MG TABLET] 30 tablet 0    Sig: Take 1 tablet (10 mg total) by mouth every evening.     Analgesics:  Muscle Relaxants - baclofen Passed - 12/30/2021 12:57 PM      Passed - Cr in normal range and within 180 days    Creatinine, Ser  Date Value Ref Range Status  12/24/2021 0.67 0.57 - 1.00 mg/dL Final          Passed - eGFR is 30 or above and within 180 days    GFR calc Af Amer  Date Value Ref Range Status  07/11/2020 108 >59 mL/min/1.73 Final    Comment:    **[facility] currently reports eGFR in compliance with the current**   recommendations of the [employer]. [facility] will   update reporting as new guidelines are published from the NKF-ASN   Task force.    GFR calc non Af Amer  Date Value Ref Range Status  07/11/2020 94 >59 mL/min/1.73 Final   eGFR  Date Value Ref Range Status  12/24/2021 98 >59 mL/min/1.73 Final          Passed - Valid encounter within last 6 months    Recent Outpatient Visits           1 week ago Diabetes mellitus without complication ([place])   [facility] Vigg, Avanti, MD   3 months ago Prediabetes   [facility] Vigg, Avanti, MD   6 months ago Prediabetes   [facility] Vigg, Avanti, MD   9 months ago H/O transfusion   [place] Vigg, Avanti, MD   11 months ago Essential (primary) hypertension   [place] Vigg, Avanti, MD       Future Appointments             In 2 months Vigg, Avanti, MD [facility], [facility]   In 7 months  [employer], [place]

## 2022-01-28 ENCOUNTER — Other Ambulatory Visit: Payer: Self-pay | Admitting: Nurse Practitioner

## 2022-01-28 DIAGNOSIS — E119 Type 2 diabetes mellitus without complications: Secondary | ICD-10-CM

## 2022-01-28 DIAGNOSIS — G801 Spastic diplegic cerebral palsy: Secondary | ICD-10-CM

## 2022-01-28 DIAGNOSIS — E7800 Pure hypercholesterolemia, unspecified: Secondary | ICD-10-CM

## 2022-01-28 DIAGNOSIS — M810 Age-related osteoporosis without current pathological fracture: Secondary | ICD-10-CM

## 2022-01-28 NOTE — Telephone Encounter (Signed)
Requested Prescriptions  ?Pending Prescriptions Disp Refills  ?? baclofen (LIORESAL) 10 MG tablet [Pharmacy Med Name: BACLOFEN 10 MG TABLET] 30 tablet 0  ?  Sig: Take 1 tablet (10 mg total) by mouth every evening.  ?  ? Analgesics:  Muscle Relaxants - baclofen Passed - 01/28/2022 12:44 PM  ?  ?  Passed - Cr in normal range and within 180 days  ?  Creatinine, Ser  ?Date Value Ref Range Status  ?12/24/2021 0.67 0.57 - 1.00 mg/dL Final  ?   ?  ?  Passed - eGFR is 30 or above and within 180 days  ?  GFR calc Af Amer  ?Date Value Ref Range Status  ?07/11/2020 108 >59 mL/min/1.73 Final  ?  Comment:  ?  **[facility] currently reports eGFR in compliance with the current** ?  recommendations of the [employer]. [facility] will ?  update reporting as new guidelines are published from the NKF-ASN ?  Task force. ?  ? ?GFR calc non Af Amer  ?Date Value Ref Range Status  ?07/11/2020 94 >59 mL/min/1.73 Final  ? ?eGFR  ?Date Value Ref Range Status  ?12/24/2021 98 >59 mL/min/1.73 Final  ?   ?  ?  Passed - Valid encounter within last 6 months  ?  Recent Outpatient Visits   ?      ? 1 month ago Diabetes mellitus without complication ([place])  ? [facility] Vigg, Avanti, MD  ? 4 months ago Prediabetes  ? [facility] Vigg, Avanti, MD  ? 7 months ago Prediabetes  ? [facility] Vigg, Avanti, MD  ? 10 months ago H/O transfusion  ? [facility] Vigg, Avanti, MD  ? 12 months ago Essential (primary) hypertension  ? [facility] Vigg, Avanti, MD  ?  ?  ?Future Appointments   ?        ? In 1 month Vigg, Avanti, MD [facility], [facility]  ? In 6 months  [place], [facility]  ?  ? ?  ?  ?  ? ?

## 2022-01-30 ENCOUNTER — Emergency Department: Payer: Medicare Other

## 2022-01-30 ENCOUNTER — Inpatient Hospital Stay
Admission: EM | Admit: 2022-01-30 | Discharge: 2022-02-03 | DRG: 871 | Disposition: A | Discharge status: Home Health | Payer: Medicare Other | Attending: Internal Medicine | Admitting: Internal Medicine

## 2022-01-30 ENCOUNTER — Other Ambulatory Visit: Payer: Self-pay

## 2022-01-30 DIAGNOSIS — M7989 Other specified soft tissue disorders: Secondary | ICD-10-CM | POA: Diagnosis not present

## 2022-01-30 DIAGNOSIS — Z7983 Long term (current) use of bisphosphonates: Secondary | ICD-10-CM

## 2022-01-30 DIAGNOSIS — Z20822 Contact with and (suspected) exposure to covid-19: Secondary | ICD-10-CM | POA: Diagnosis present

## 2022-01-30 DIAGNOSIS — A4151 Sepsis due to Escherichia coli [E. coli]: Principal | ICD-10-CM | POA: Diagnosis present

## 2022-01-30 DIAGNOSIS — R739 Hyperglycemia, unspecified: Secondary | ICD-10-CM | POA: Diagnosis present

## 2022-01-30 DIAGNOSIS — C641 Malignant neoplasm of right kidney, except renal pelvis: Secondary | ICD-10-CM | POA: Diagnosis present

## 2022-01-30 DIAGNOSIS — K5790 Diverticulosis of intestine, part unspecified, without perforation or abscess without bleeding: Secondary | ICD-10-CM | POA: Diagnosis not present

## 2022-01-30 DIAGNOSIS — Z79899 Other long term (current) drug therapy: Secondary | ICD-10-CM

## 2022-01-30 DIAGNOSIS — N3001 Acute cystitis with hematuria: Secondary | ICD-10-CM | POA: Diagnosis not present

## 2022-01-30 DIAGNOSIS — F32A Depression, unspecified: Secondary | ICD-10-CM | POA: Diagnosis present

## 2022-01-30 DIAGNOSIS — N2889 Other specified disorders of kidney and ureter: Principal | ICD-10-CM

## 2022-01-30 DIAGNOSIS — R109 Unspecified abdominal pain: Secondary | ICD-10-CM | POA: Diagnosis present

## 2022-01-30 DIAGNOSIS — Z8249 Family history of ischemic heart disease and other diseases of the circulatory system: Secondary | ICD-10-CM | POA: Diagnosis not present

## 2022-01-30 DIAGNOSIS — F17210 Nicotine dependence, cigarettes, uncomplicated: Secondary | ICD-10-CM | POA: Diagnosis present

## 2022-01-30 DIAGNOSIS — G809 Cerebral palsy, unspecified: Secondary | ICD-10-CM | POA: Diagnosis not present

## 2022-01-30 DIAGNOSIS — J309 Allergic rhinitis, unspecified: Secondary | ICD-10-CM | POA: Diagnosis present

## 2022-01-30 DIAGNOSIS — Z90710 Acquired absence of both cervix and uterus: Secondary | ICD-10-CM

## 2022-01-30 DIAGNOSIS — I872 Venous insufficiency (chronic) (peripheral): Secondary | ICD-10-CM | POA: Diagnosis present

## 2022-01-30 DIAGNOSIS — R52 Pain, unspecified: Secondary | ICD-10-CM | POA: Diagnosis not present

## 2022-01-30 DIAGNOSIS — K449 Diaphragmatic hernia without obstruction or gangrene: Secondary | ICD-10-CM | POA: Diagnosis not present

## 2022-01-30 DIAGNOSIS — Z833 Family history of diabetes mellitus: Secondary | ICD-10-CM | POA: Diagnosis not present

## 2022-01-30 DIAGNOSIS — E876 Hypokalemia: Secondary | ICD-10-CM | POA: Diagnosis present

## 2022-01-30 DIAGNOSIS — K219 Gastro-esophageal reflux disease without esophagitis: Secondary | ICD-10-CM | POA: Diagnosis present

## 2022-01-30 DIAGNOSIS — E871 Hypo-osmolality and hyponatremia: Secondary | ICD-10-CM | POA: Diagnosis present

## 2022-01-30 DIAGNOSIS — R1084 Generalized abdominal pain: Secondary | ICD-10-CM

## 2022-01-30 DIAGNOSIS — E785 Hyperlipidemia, unspecified: Secondary | ICD-10-CM | POA: Diagnosis present

## 2022-01-30 DIAGNOSIS — R600 Localized edema: Secondary | ICD-10-CM | POA: Diagnosis present

## 2022-01-30 DIAGNOSIS — K5792 Diverticulitis of intestine, part unspecified, without perforation or abscess without bleeding: Secondary | ICD-10-CM | POA: Diagnosis not present

## 2022-01-30 DIAGNOSIS — K5732 Diverticulitis of large intestine without perforation or abscess without bleeding: Secondary | ICD-10-CM | POA: Diagnosis present

## 2022-01-30 DIAGNOSIS — I10 Essential (primary) hypertension: Secondary | ICD-10-CM | POA: Diagnosis not present

## 2022-01-30 DIAGNOSIS — F339 Major depressive disorder, recurrent, unspecified: Secondary | ICD-10-CM | POA: Diagnosis present

## 2022-01-30 DIAGNOSIS — J9601 Acute respiratory failure with hypoxia: Secondary | ICD-10-CM | POA: Diagnosis present

## 2022-01-30 DIAGNOSIS — A419 Sepsis, unspecified organism: Secondary | ICD-10-CM | POA: Diagnosis present

## 2022-01-30 DIAGNOSIS — K769 Liver disease, unspecified: Secondary | ICD-10-CM | POA: Diagnosis not present

## 2022-01-30 DIAGNOSIS — R7303 Prediabetes: Secondary | ICD-10-CM | POA: Diagnosis present

## 2022-01-30 DIAGNOSIS — R110 Nausea: Secondary | ICD-10-CM | POA: Diagnosis not present

## 2022-01-30 LAB — COMPREHENSIVE METABOLIC PANEL
ALT: 15 U/L (ref 0–44)
AST: 17 U/L (ref 15–41)
Albumin: 3.9 g/dL (ref 3.5–5.0)
Alkaline Phosphatase: 104 U/L (ref 38–126)
Anion gap: 12 (ref 5–15)
BUN: 14 mg/dL (ref 8–23)
CO2: 27 mmol/L (ref 22–32)
Calcium: 9.6 mg/dL (ref 8.9–10.3)
Chloride: 95 mmol/L — ABNORMAL LOW (ref 98–111)
Creatinine, Ser: 0.73 mg/dL (ref 0.44–1.00)
GFR, Estimated: >60 mL/min (ref >60)
Glucose, Bld: 180 mg/dL — ABNORMAL HIGH (ref 70–99)
Potassium: 3.2 mmol/L — ABNORMAL LOW (ref 3.5–5.1)
Sodium: 134 mmol/L — ABNORMAL LOW (ref 135–145)
Total Bilirubin: 0.8 mg/dL (ref 0.3–1.2)
Total Protein: 8.1 g/dL (ref 6.5–8.1)

## 2022-01-30 LAB — RESP PANEL BY RT-PCR (FLU A&B, COVID) ARPGX2
Influenza A by PCR: NEGATIVE
Influenza B by PCR: NEGATIVE
SARS Coronavirus 2 by RT PCR: NEGATIVE

## 2022-01-30 LAB — LACTATE DEHYDROGENASE: LDH: 115 U/L (ref 98–192)

## 2022-01-30 LAB — URINALYSIS, ROUTINE W REFLEX MICROSCOPIC
Bilirubin Urine: NEGATIVE
Glucose, UA: NEGATIVE mg/dL
Ketones, ur: NEGATIVE mg/dL
Nitrite: NEGATIVE
Protein, ur: 100 mg/dL — AB
Specific Gravity, Urine: 1.012 (ref 1.005–1.030)
WBC, UA: >50 WBC/hpf — ABNORMAL HIGH (ref 0–5)
pH: 7 (ref 5.0–8.0)

## 2022-01-30 LAB — CBC
HCT: 45.1 % (ref 36.0–46.0)
HCT: 40.9 % (ref 36.0–46.0)
Hemoglobin: 14.9 g/dL (ref 12.0–15.0)
Hemoglobin: 13.3 g/dL (ref 12.0–15.0)
MCH: 31.8 pg (ref 26.0–34.0)
MCH: 31.3 pg (ref 26.0–34.0)
MCHC: 33 g/dL (ref 30.0–36.0)
MCHC: 32.5 g/dL (ref 30.0–36.0)
MCV: 96.4 fL (ref 80.0–100.0)
MCV: 96.2 fL (ref 80.0–100.0)
Platelets: 324 10*3/uL (ref 150–400)
Platelets: 298 10*3/uL (ref 150–400)
RBC: 4.68 MIL/uL (ref 3.87–5.11)
RBC: 4.25 MIL/uL (ref 3.87–5.11)
RDW: 12.4 % (ref 11.5–15.5)
RDW: 12.4 % (ref 11.5–15.5)
WBC: 16.4 10*3/uL — ABNORMAL HIGH (ref 4.0–10.5)
WBC: 17.5 10*3/uL — ABNORMAL HIGH (ref 4.0–10.5)
nRBC: 0 % (ref 0.0–0.2)
nRBC: 0 % (ref 0.0–0.2)

## 2022-01-30 LAB — MAGNESIUM: Magnesium: 1.8 mg/dL (ref 1.7–2.4)

## 2022-01-30 LAB — CREATININE, SERUM
Creatinine, Ser: 0.71 mg/dL (ref 0.44–1.00)
GFR, Estimated: >60 mL/min (ref >60)

## 2022-01-30 LAB — LIPASE, BLOOD: Lipase: 33 U/L (ref 11–51)

## 2022-01-30 LAB — TROPONIN I (HIGH SENSITIVITY): Troponin I (High Sensitivity): 11 ng/L (ref <18)

## 2022-01-30 MED ORDER — IOHEXOL 300 MG/ML  SOLN
100.0000 mL | Freq: Once | INTRAMUSCULAR | Status: AC | PRN
  Administered 2022-01-30: 100 mL via INTRAVENOUS
  Filled 2022-01-30: qty 100

## 2022-01-30 MED ORDER — ONDANSETRON HCL 4 MG PO TABS: 4.0000 mg | ORAL_TABLET | Freq: Four times a day (QID) | ORAL | Status: DC | PRN

## 2022-01-30 MED ORDER — ONDANSETRON HCL 4 MG/2ML IJ SOLN: 4.0000 mg | Freq: Four times a day (QID) | INTRAMUSCULAR | Status: DC | PRN

## 2022-01-30 MED ORDER — HEPARIN SODIUM (PORCINE) 5000 UNIT/ML IJ SOLN
5000.0000 [IU] | Freq: Three times a day (TID) | INTRAMUSCULAR | Status: DC
  Administered 2022-01-30 – 2022-01-31 (×2): 5000 [IU] via SUBCUTANEOUS
  Filled 2022-01-30 (×2): qty 1

## 2022-01-30 MED ORDER — SODIUM CHLORIDE 0.9 % IV BOLUS
1000.0000 mL | Freq: Once | INTRAVENOUS | Status: AC
  Administered 2022-01-30: 1000 mL via INTRAVENOUS

## 2022-01-30 MED ORDER — AMOXICILLIN-POT CLAVULANATE 875-125 MG PO TABS: 1.0000 | ORAL_TABLET | Freq: Two times a day (BID) | ORAL | 0 refills | Status: DC

## 2022-01-30 MED ORDER — POTASSIUM CHLORIDE CRYS ER 20 MEQ PO TBCR
40.0000 meq | EXTENDED_RELEASE_TABLET | Freq: Once | ORAL | Status: AC
  Administered 2022-01-30: 40 meq via ORAL
  Filled 2022-01-30: qty 2

## 2022-01-30 MED ORDER — ACETAMINOPHEN 500 MG PO TABS
1000.0000 mg | ORAL_TABLET | Freq: Four times a day (QID) | ORAL | Status: DC | PRN
Start: 2022-01-30 — End: 2022-01-31
  Administered 2022-01-30: 1000 mg via ORAL
  Filled 2022-01-30: qty 2

## 2022-01-30 MED ORDER — SODIUM CHLORIDE 0.9 % IV SOLN: 1.0000 g | Freq: Once | INTRAVENOUS | Status: DC

## 2022-01-30 MED ORDER — NICOTINE 21 MG/24HR TD PT24
21.0000 mg | MEDICATED_PATCH | Freq: Every day | TRANSDERMAL | Status: DC
  Administered 2022-01-30 – 2022-01-31 (×2): 21 mg via TRANSDERMAL
  Filled 2022-01-30 (×4): qty 1

## 2022-01-30 MED ORDER — POTASSIUM CHLORIDE 10 MEQ/100ML IV SOLN
10.0000 meq | INTRAVENOUS | Status: AC
  Administered 2022-01-30 (×2): 10 meq via INTRAVENOUS
  Filled 2022-01-30: qty 100

## 2022-01-30 MED ORDER — AMOXICILLIN-POT CLAVULANATE 875-125 MG PO TABS
1.0000 | ORAL_TABLET | Freq: Once | ORAL | Status: AC
  Administered 2022-01-30: 1 via ORAL
  Filled 2022-01-30: qty 1

## 2022-01-30 MED ORDER — SUCRALFATE 1 GM/10ML PO SUSP
1.0000 g | Freq: Three times a day (TID) | ORAL | Status: DC
  Filled 2022-01-30 (×2): qty 10

## 2022-01-30 MED ORDER — METRONIDAZOLE 500 MG/100ML IV SOLN: 500.0000 mg | Freq: Once | INTRAVENOUS | Status: DC

## 2022-01-30 MED ORDER — SUCRALFATE 1 G PO TABS
1.0000 g | ORAL_TABLET | Freq: Three times a day (TID) | ORAL | Status: DC
  Administered 2022-01-30 – 2022-02-02 (×9): 1 g via ORAL
  Filled 2022-01-30 (×9): qty 1

## 2022-01-30 MED ORDER — MORPHINE SULFATE (PF) 4 MG/ML IV SOLN
4.0000 mg | Freq: Once | INTRAVENOUS | Status: AC
  Administered 2022-01-30: 4 mg via INTRAVENOUS
  Filled 2022-01-30: qty 1

## 2022-01-30 MED ORDER — PANTOPRAZOLE SODIUM 40 MG IV SOLR
40.0000 mg | Freq: Two times a day (BID) | INTRAVENOUS | Status: DC
  Administered 2022-01-30 – 2022-02-03 (×8): 40 mg via INTRAVENOUS
  Filled 2022-01-30 (×8): qty 10

## 2022-01-30 MED ORDER — SODIUM CHLORIDE 0.9 % IV SOLN: INTRAVENOUS | Status: DC

## 2022-01-30 MED ORDER — ONDANSETRON HCL 4 MG/2ML IJ SOLN
4.0000 mg | Freq: Once | INTRAMUSCULAR | Status: AC
  Administered 2022-01-30: 4 mg via INTRAVENOUS
  Filled 2022-01-30: qty 2

## 2022-01-30 NOTE — H&P (Signed)
?History and Physical  ? ? ?Patient: Michelle Chandler JSR:[id number] DOB: [birth year] ?DOA: 01/30/2022 ?DOS: the patient was seen and examined on 01/31/2022 ?PCP: Charlynne Cousins, MD  ?Patient coming from: Home ? ?Chief Complaint:  ?Chief Complaint  ?Patient presents with  ? Abdominal Pain  ? ?HPI: Michelle Chandler is a 66 y.o. female with medical history significant of cerebral palsy and   ?Pt had abdominal pain: ?Duration: Today at am. ? ?Frequency:Intermittent. ? ?Location:generalized  ? ?Quality:sharp . ? ?Rate:10/10  ? ?Radiation:left to right.  ? ?Aggravating:nothing  ? ?Alleviating:nothing  ? ?Associated factors:nothing.  ? ?Review of Systems:  ?Review of Systems  ?Gastrointestinal:  Positive for abdominal pain, diarrhea and nausea.  ? ?Past Medical History:  ?Diagnosis Date  ? Advance care planning 06/28/2019  ? Allergic rhinitis   ? Ataxia   ? Bursitis of shoulder 11/20/2015  ? Overview:  Last Assessment & Plan:  Discussed care and treatment shoulder bursitis with meloxicam  ? CP (cerebral palsy) ([place])   ? Depression   ? Diverticulitis   ? Diverticulosis   ? Fatigue 02/05/2020  ? Hyperlipidemia   ? Hypertension   ? IFG (impaired fasting glucose)   ? Venous insufficiency   ? Venous stasis ulcer ([place])   ? Vertigo   ? ?Past Surgical History:  ?Procedure Laterality Date  ? ABDOMINAL HYSTERECTOMY    ? complete  ? LAPAROSCOPY    ? legs and hip    ? due to CP  ? ?Social History:  reports that she has been smoking cigarettes. She has been smoking an average of .5 packs per day. She has never used smokeless tobacco. She reports that she does not drink alcohol and does not use drugs. ? ?No Known Allergies ? ? ?Family History  ?Problem Relation Age of Onset  ? Diabetes Mother   ? Diabetes Father   ? Heart disease Maternal Uncle 46  ? Kidney disease Maternal Uncle   ? ? ?Prior to Admission medications   ?Medication Sig Start Date End Date Taking? Authorizing Provider  ?amoxicillin-clavulanate (AUGMENTIN) 875-125 MG tablet Take 1 tablet by  mouth 2 (two) times daily for 7 days. 01/30/22 02/06/22 Yes Rada Hay, MD  ?atorvastatin (LIPITOR) 40 MG tablet Take 1 tablet (40 mg total) by mouth daily. 11/27/21   Vigg, Avanti, MD  ?baclofen (LIORESAL) 10 MG tablet Take 1 tablet (10 mg total) by mouth every evening. 01/28/22   Vigg, Avanti, MD  ?famotidine (PEPCID) 20 MG tablet Take 1 tablet (20 mg total) by mouth 2 (two) times daily. 12/24/21   Vigg, Avanti, MD  ?FLUoxetine (PROZAC) 20 MG capsule Take 1 capsule (20 mg total) by mouth daily. 11/28/21   Vigg, Avanti, MD  ?hydrochlorothiazide (HYDRODIURIL) 25 MG tablet Take 1 tablet (25 mg total) by mouth daily. 11/27/21   Vigg, Avanti, MD  ?hydrOXYzine (VISTARIL) 25 MG capsule Take 1 capsule (25 mg total) by mouth every 8 (eight) hours as needed. 06/11/21   Vigg, Avanti, MD  ?ibandronate (BONIVA) 150 MG tablet Take 1 tablet (150 mg total) by mouth every 30 (thirty) days. Take in the morning with a full glass of water, on an empty stomach, and do not take anything else by mouth or lie down for the next 30 min. 06/11/21   Vigg, Avanti, MD  ?ibuprofen (ADVIL) 400 MG tablet Take 1 tablet (400 mg total) by mouth 3 (three) times daily as needed. 08/27/21   Charlynne Cousins, MD  ?Incontinence Supply Disposable (ENTRUST PLUS DISP UNDERPADS)  MISC by Does not apply route.    [provider]  ?Incontinence Supply Disposable (PROTECTIVE UNDERWEAR LARGE) MISC by Does not apply route.    [provider]  ? ? ?Physical Exam: ?Vitals:  ? 01/30/22 2000 01/30/22 2129 01/30/22 2321 01/30/22 2330  ?BP: 121/66   119/65  ?Pulse: 91   81  ?Resp: 18   18  ?Temp: 99.8 ?F (37.7 ?C) (!) 100.8 ?F (38.2 ?C) 99.2 ?F (37.3 ?C)   ?TempSrc: Oral  Oral Oral  ?SpO2: 96% 96%  96%  ?Weight:      ?Height:      ? ?Physical Exam ?Vitals and nursing note reviewed.  ?Constitutional:   ?   General: She is not in acute distress. ?   Appearance: Normal appearance. She is not ill-appearing, toxic-appearing or diaphoretic.  ?HENT:  ?   Head:  Normocephalic and atraumatic.  ?   Right Ear: Hearing and external ear normal.  ?   Left Ear: Hearing and external ear normal.  ?   Nose: Nose normal. No nasal deformity.  ?   Mouth/Throat:  ?   Lips: Pink.  ?   Mouth: Mucous membranes are moist.  ?   Tongue: No lesions.  ?   Pharynx: Oropharynx is clear.  ?Eyes:  ?   Extraocular Movements: Extraocular movements intact.  ?   Pupils: Pupils are equal, round, and reactive to light.  ?Neck:  ?   Vascular: No carotid bruit.  ?Cardiovascular:  ?   Rate and Rhythm: Normal rate and regular rhythm.  ?   Pulses: Normal pulses.  ?   Heart sounds: Normal heart sounds.  ?Pulmonary:  ?   Effort: Pulmonary effort is normal.  ?   Breath sounds: Normal breath sounds.  ?Abdominal:  ?   General: Bowel sounds are normal. There is no distension.  ?   Palpations: Abdomen is soft. There is no mass.  ?   Tenderness: There is no abdominal tenderness. There is no guarding.  ?   Hernia: No hernia is present.  ?Musculoskeletal:  ?   Right lower leg: No edema.  ?   Left lower leg: No edema.  ?Skin: ?   General: Skin is warm.  ?Neurological:  ?   General: No focal deficit present.  ?   Mental Status: She is alert and oriented to person, place, and time.  ?   Cranial Nerves: Cranial nerves 2-12 are intact.  ?   Motor: Motor function is intact.  ?Psychiatric:     ?   Attention and Perception: Attention normal.     ?   Mood and Affect: Mood normal.     ?   Speech: Speech normal.     ?   Behavior: Behavior normal. Behavior is cooperative.     ?   Cognition and Memory: Cognition normal.  ? ? ?Data Reviewed: ?Results for orders placed or performed during the hospital encounter of 01/30/22 (from the past 24 hour(s))  ?Lipase, blood     Status: None  ? Collection Time: 01/30/22 12:24 PM  ?Result Value Ref Range  ? Lipase 33 11 - 51 U/L  ?Comprehensive metabolic panel     Status: Abnormal  ? Collection Time: 01/30/22 12:24 PM  ?Result Value Ref Range  ? Sodium 134 (L) 135 - 145 mmol/L  ? Potassium 3.2  (L) 3.5 - 5.1 mmol/L  ? Chloride 95 (L) 98 - 111 mmol/L  ? CO2 27 22 - 32 mmol/L  ?  Glucose, Bld 180 (H) 70 - 99 mg/dL  ? BUN 14 8 - 23 mg/dL  ? Creatinine, Ser 0.73 0.44 - 1.00 mg/dL  ? Calcium 9.6 8.9 - 10.3 mg/dL  ? Total Protein 8.1 6.5 - 8.1 g/dL  ? Albumin 3.9 3.5 - 5.0 g/dL  ? AST 17 15 - 41 U/L  ? ALT 15 0 - 44 U/L  ? Alkaline Phosphatase 104 38 - 126 U/L  ? Total Bilirubin 0.8 0.3 - 1.2 mg/dL  ? GFR, Estimated >60 >60 mL/min  ? Anion gap 12 5 - 15  ?CBC     Status: Abnormal  ? Collection Time: 01/30/22 12:24 PM  ?Result Value Ref Range  ? WBC 16.4 (H) 4.0 - 10.5 K/uL  ? RBC 4.68 3.87 - 5.11 MIL/uL  ? Hemoglobin 14.9 12.0 - 15.0 g/dL  ? HCT 45.1 36.0 - 46.0 %  ? MCV 96.4 80.0 - 100.0 fL  ? MCH 31.8 26.0 - 34.0 pg  ? MCHC 33.0 30.0 - 36.0 g/dL  ? RDW 12.4 11.5 - 15.5 %  ? Platelets 324 150 - 400 K/uL  ? nRBC 0.0 0.0 - 0.2 %  ?Urinalysis, Routine w reflex microscopic     Status: Abnormal  ? Collection Time: 01/30/22 12:24 PM  ?Result Value Ref Range  ? Color, Urine YELLOW (A) YELLOW  ? APPearance TURBID (A) CLEAR  ? Specific Gravity, Urine 1.012 1.005 - 1.030  ? pH 7.0 5.0 - 8.0  ? Glucose, UA NEGATIVE NEGATIVE mg/dL  ? Hgb urine dipstick MODERATE (A) NEGATIVE  ? Bilirubin Urine NEGATIVE NEGATIVE  ? Ketones, ur NEGATIVE NEGATIVE mg/dL  ? Protein, ur 100 (A) NEGATIVE mg/dL  ? Nitrite NEGATIVE NEGATIVE  ? Leukocytes,Ua LARGE (A) NEGATIVE  ? RBC / HPF 21-50 0 - 5 RBC/hpf  ? WBC, UA >50 (H) 0 - 5 WBC/hpf  ? Bacteria, UA MANY (A) NONE SEEN  ? Squamous Epithelial / LPF 6-10 0 - 5  ? WBC Clumps PRESENT   ? Mucus PRESENT   ?Resp Panel by RT-PCR (Flu A&B, Covid) Nasopharyngeal Swab     Status: None  ? Collection Time: 01/30/22  6:18 PM  ? Specimen: Nasopharyngeal Swab; Nasopharyngeal(NP) swabs in vial transport medium  ?Result Value Ref Range  ? SARS Coronavirus 2 by RT PCR NEGATIVE NEGATIVE  ? Influenza A by PCR NEGATIVE NEGATIVE  ? Influenza B by PCR NEGATIVE NEGATIVE  ?CBC     Status: Abnormal  ? Collection Time:  01/30/22  7:28 PM  ?Result Value Ref Range  ? WBC 17.5 (H) 4.0 - 10.5 K/uL  ? RBC 4.25 3.87 - 5.11 MIL/uL  ? Hemoglobin 13.3 12.0 - 15.0 g/dL  ? HCT 40.9 36.0 - 46.0 %  ? MCV 96.2 80.0 - 100.0 fL  ? MCH 31.3

## 2022-01-30 NOTE — ED Notes (Signed)
Called phlebotomy to assist in collecting blood cultures since pt hard stick. Told by lab needs to be run through charge RN first. Passenger transport manager notified. Contacted float RN to assist.  ?

## 2022-01-30 NOTE — ED Provider Notes (Addendum)
? ?[facility] ?Provider Note ? ? ? Event Date/Time  ? First MD Initiated Contact with Patient 01/30/22 1241   ?  (approximate) ? ? ?History  ? ?Abdominal Pain ? ? ?HPI ? ?Michelle Chandler is a 66 y.o. female with past medical history of cerebral palsy, diverticulitis, depression, hypertension, hyperlipidemia who presents with abdominal pain.  Symptoms started this morning.  Pain is located diffusely both in the upper and lower abdomen has not eaten today.  Denies nausea vomiting fever chills diarrhea.  History of hysterectomy.  Does have history of similar pain but not sure what it was caused from.  Denies chest pain or shortness of breath. ?  ? ?Past Medical History:  ?Diagnosis Date  ? Advance care planning 06/28/2019  ? Allergic rhinitis   ? Ataxia   ? Bursitis of shoulder 11/20/2015  ? Overview:  Last Assessment & Plan:  Discussed care and treatment shoulder bursitis with meloxicam  ? CP (cerebral palsy) ([place])   ? Depression   ? Diverticulitis   ? Diverticulosis   ? Fatigue 02/05/2020  ? Hyperlipidemia   ? Hypertension   ? IFG (impaired fasting glucose)   ? Venous insufficiency   ? Venous stasis ulcer ([place])   ? Vertigo   ? ? ?Patient Active Problem List  ? Diagnosis Date Noted  ? Need for influenza vaccination 09/16/2021  ? Anxiety 06/12/2021  ? Osteoporosis 06/12/2021  ? Prediabetes 10/07/2020  ? Gastroesophageal reflux disease 06/10/2020  ? Smoking greater than 40 pack years 06/10/2020  ? Cerebral palsy ([place]) 12/27/2015  ? Hyperlipidemia 12/27/2015  ? Essential (primary) hypertension 12/27/2015  ? Depression, recurrent ([place]) 11/20/2015  ? ? ? ?Physical Exam  ?Triage Vital Signs: ?ED Triage Vitals  ?Enc Vitals Group  ?   BP 01/30/22 1223 (!) 144/81  ?   Pulse Rate 01/30/22 1223 87  ?   Resp 01/30/22 1223 18  ?   Temp 01/30/22 1223 98.2 ?F (36.8 ?C)  ?   Temp Source 01/30/22 1223 Oral  ?   SpO2 01/30/22 1223 96 %  ?   Weight 01/30/22 1221 150 lb (68 kg)  ?   Height 01/30/22 1221 5' (1.524 m)  ?    Head Circumference --   ?   Peak Flow --   ?   Pain Score 01/30/22 1220 7  ?   Pain Loc --   ?   Pain Edu? --   ?   Excl. in [place]? --   ? ? ?Most recent vital signs: ?Vitals:  ? 01/30/22 1756 01/30/22 1800  ?BP:    ?Pulse: (!) 104 97  ?Resp:    ?Temp:    ?SpO2: (!) 85% 97%  ? ? ? ?General: Awake, no distress.  ?CV:  Good peripheral perfusion.  ?Resp:  Normal effort.  ?Abd:  No distention.  Mild tenderness to palpation bilateral lower quadrants and epigastric region ?Neuro:             Awake, Alert, Oriented x 3  ?Other:   ? ? ?ED Results / Procedures / Treatments  ?Labs ?(all labs ordered are listed, but only abnormal results are displayed) ?Labs Reviewed  ?COMPREHENSIVE METABOLIC PANEL - Abnormal; Notable for the following components:  ?    Result Value  ? Sodium 134 (*)   ? Potassium 3.2 (*)   ? Chloride 95 (*)   ? Glucose, Bld 180 (*)   ? All other components within normal limits  ?CBC - Abnormal;  Notable for the following components:  ? WBC 16.4 (*)   ? All other components within normal limits  ?URINALYSIS, ROUTINE W REFLEX MICROSCOPIC - Abnormal; Notable for the following components:  ? Color, Urine YELLOW (*)   ? APPearance TURBID (*)   ? Hgb urine dipstick MODERATE (*)   ? Protein, ur 100 (*)   ? Leukocytes,Ua LARGE (*)   ? WBC, UA >50 (*)   ? Bacteria, UA MANY (*)   ? All other components within normal limits  ?URINE CULTURE  ?RESP PANEL BY RT-PCR (FLU A&B, COVID) ARPGX2  ?LIPASE, BLOOD  ? ? ? ?EKG ? ?EKG interpreted by myself, normal sinus rhythm, low voltage, left axis, poor R wave progression, ? ? ?RADIOLOGY ? ? ? ?PROCEDURES: ? ?Critical Care performed: No ? ?Procedures ? ? ?MEDICATIONS ORDERED IN ED: ?Medications  ?morphine (PF) 4 MG/ML injection 4 mg (4 mg Intravenous Given 01/30/22 1413)  ?iohexol (OMNIPAQUE) 300 MG/ML solution 100 mL (100 mLs Intravenous Contrast Given 01/30/22 1430)  ?amoxicillin-clavulanate (AUGMENTIN) 875-125 MG per tablet 1 tablet (1 tablet Oral Given 01/30/22 1610)  ?ondansetron  (ZOFRAN) injection 4 mg (4 mg Intravenous Given 01/30/22 1750)  ?sodium chloride 0.9 % bolus 1,000 mL (1,000 mLs Intravenous New Bag/Given 01/30/22 1754)  ?morphine (PF) 4 MG/ML injection 4 mg (4 mg Intravenous Given 01/30/22 1751)  ? ? ? ?IMPRESSION / MDM / ASSESSMENT AND PLAN / ED COURSE  ?I reviewed the triage vital signs and the nursing notes. ?             ?               ? ?Differential diagnosis includes, but is not limited to, diverticulitis, pancreatitis, biliary pathology, bowel obstruction, reflux ?The patient is a 66 year old female with cerebral palsy although rather high functioning who presents with diffuse abdominal pain that started this morning.  Really no associated symptoms.  Looks well on exam but is tender both in the epigastrium and lower quadrants.  She has a leukocytosis of 16, urine does have greater than 50 WBCs but she has no urinary symptoms.  Pilo certainly still possible.  We will obtain a CT of the abdomen pelvis with contrast and treat her pain with morphine. ? ?CT abdomen pelvis shows thickening of the sigmoid colon which could represent diverticulitis versus malignancy.  Given her leukocytosis and pain we will treat with a 7-day course of Augmentin but I did recommend she see GI to have a colonoscopy to exclude malignancy.  There is also incidentally noted mass on the kidney which radiology comments looks like renal cell carcinoma.  Discussed results with the patient and her family.  She will need urology follow-up.  Patient's UA does have significant WBCs, Augmentin will also cover potential UTI.  We will add on a urine culture.  Attempted to get the patient up for discharge however she had significant pain was unable to ambulate and then vomited.  This is after already a dose of morphine.  We will give another dose of morphine fluid bolus and Zofran.  Ultimately she will likely require admission. ? ?After 4 mg of morphine again patient still having discomfort.  Will admit to the  hospitalist service.  She had already received a dose of Augmentin but will cover with an additional ceftriaxone and Flagyl. ?  ? ? ?FINAL CLINICAL IMPRESSION(S) / ED DIAGNOSES  ? ?Final diagnoses:  ?Renal mass  ?Diverticulitis  ? ? ? ?Rx / DC Orders  ? ?  ED Discharge Orders   ? ?      Ordered  ?  amoxicillin-clavulanate (AUGMENTIN) 875-125 MG tablet  2 times daily       ? 01/30/22 1559  ? ?  ?  ? ?  ? ? ? ?Note:  This document was prepared using Dragon voice recognition software and may include unintentional dictation errors. ?  ?Rada Hay, MD ?01/30/22 1558 ? ?  ?Rada Hay, MD ?01/30/22 1820 ? ?

## 2022-01-30 NOTE — ED Notes (Signed)
IV team at bedside. This RN will give meds once IV access obtained.  ?

## 2022-01-30 NOTE — ED Notes (Signed)
Prompted pt to try and breath deeply rather than shallowly as desat to 85% RA. Placed on 2L O2; sat 97%.  ?

## 2022-01-30 NOTE — ED Notes (Signed)
FIRST NURSE NOTE: Pt via EMS from home upper abd pain that started yesterday, radiates to her shoulder but that has subsided.  ?VSS  ?A&Ox4 and NAD ?

## 2022-01-30 NOTE — ED Notes (Signed)
Attempted x2 for 22g IV; once at L fa and once at R fa.  ?

## 2022-01-30 NOTE — ED Triage Notes (Signed)
Pt to ED via [facility] from home. Pt reports sharp epigastric abdominal pain that started last night. Pt reports normal. BM this morning. Pt denies V/D, CP, SOB.   ?

## 2022-01-31 ENCOUNTER — Observation Stay: Payer: Medicare Other

## 2022-01-31 DIAGNOSIS — K769 Liver disease, unspecified: Secondary | ICD-10-CM | POA: Diagnosis not present

## 2022-01-31 DIAGNOSIS — K219 Gastro-esophageal reflux disease without esophagitis: Secondary | ICD-10-CM | POA: Diagnosis present

## 2022-01-31 DIAGNOSIS — K449 Diaphragmatic hernia without obstruction or gangrene: Secondary | ICD-10-CM | POA: Diagnosis not present

## 2022-01-31 DIAGNOSIS — Z8249 Family history of ischemic heart disease and other diseases of the circulatory system: Secondary | ICD-10-CM | POA: Diagnosis not present

## 2022-01-31 DIAGNOSIS — K5790 Diverticulosis of intestine, part unspecified, without perforation or abscess without bleeding: Secondary | ICD-10-CM | POA: Diagnosis not present

## 2022-01-31 DIAGNOSIS — J309 Allergic rhinitis, unspecified: Secondary | ICD-10-CM | POA: Diagnosis present

## 2022-01-31 DIAGNOSIS — N3001 Acute cystitis with hematuria: Secondary | ICD-10-CM

## 2022-01-31 DIAGNOSIS — J9601 Acute respiratory failure with hypoxia: Secondary | ICD-10-CM | POA: Diagnosis present

## 2022-01-31 DIAGNOSIS — G809 Cerebral palsy, unspecified: Secondary | ICD-10-CM | POA: Diagnosis present

## 2022-01-31 DIAGNOSIS — I10 Essential (primary) hypertension: Secondary | ICD-10-CM | POA: Diagnosis present

## 2022-01-31 DIAGNOSIS — A4151 Sepsis due to Escherichia coli [E. coli]: Secondary | ICD-10-CM | POA: Diagnosis present

## 2022-01-31 DIAGNOSIS — Z79899 Other long term (current) drug therapy: Secondary | ICD-10-CM | POA: Diagnosis not present

## 2022-01-31 DIAGNOSIS — Z20822 Contact with and (suspected) exposure to covid-19: Secondary | ICD-10-CM | POA: Diagnosis present

## 2022-01-31 DIAGNOSIS — K5792 Diverticulitis of intestine, part unspecified, without perforation or abscess without bleeding: Secondary | ICD-10-CM | POA: Diagnosis present

## 2022-01-31 DIAGNOSIS — Z90710 Acquired absence of both cervix and uterus: Secondary | ICD-10-CM | POA: Diagnosis not present

## 2022-01-31 DIAGNOSIS — A419 Sepsis, unspecified organism: Secondary | ICD-10-CM | POA: Diagnosis present

## 2022-01-31 DIAGNOSIS — I872 Venous insufficiency (chronic) (peripheral): Secondary | ICD-10-CM | POA: Diagnosis present

## 2022-01-31 DIAGNOSIS — F17210 Nicotine dependence, cigarettes, uncomplicated: Secondary | ICD-10-CM | POA: Diagnosis present

## 2022-01-31 DIAGNOSIS — E876 Hypokalemia: Secondary | ICD-10-CM | POA: Diagnosis present

## 2022-01-31 DIAGNOSIS — Z7983 Long term (current) use of bisphosphonates: Secondary | ICD-10-CM | POA: Diagnosis not present

## 2022-01-31 DIAGNOSIS — R739 Hyperglycemia, unspecified: Secondary | ICD-10-CM | POA: Diagnosis present

## 2022-01-31 DIAGNOSIS — M7989 Other specified soft tissue disorders: Secondary | ICD-10-CM | POA: Diagnosis not present

## 2022-01-31 DIAGNOSIS — N2889 Other specified disorders of kidney and ureter: Principal | ICD-10-CM

## 2022-01-31 DIAGNOSIS — Z833 Family history of diabetes mellitus: Secondary | ICD-10-CM | POA: Diagnosis not present

## 2022-01-31 DIAGNOSIS — F32A Depression, unspecified: Secondary | ICD-10-CM | POA: Diagnosis present

## 2022-01-31 DIAGNOSIS — E871 Hypo-osmolality and hyponatremia: Secondary | ICD-10-CM | POA: Diagnosis present

## 2022-01-31 DIAGNOSIS — R600 Localized edema: Secondary | ICD-10-CM | POA: Diagnosis present

## 2022-01-31 DIAGNOSIS — K5732 Diverticulitis of large intestine without perforation or abscess without bleeding: Secondary | ICD-10-CM | POA: Diagnosis present

## 2022-01-31 DIAGNOSIS — E785 Hyperlipidemia, unspecified: Secondary | ICD-10-CM | POA: Diagnosis present

## 2022-01-31 DIAGNOSIS — C641 Malignant neoplasm of right kidney, except renal pelvis: Secondary | ICD-10-CM | POA: Diagnosis present

## 2022-01-31 HISTORY — DX: Sepsis due to Escherichia coli (e. coli): A41.51

## 2022-01-31 LAB — COMPREHENSIVE METABOLIC PANEL
ALT: 9 U/L (ref 0–44)
AST: 13 U/L — ABNORMAL LOW (ref 15–41)
Albumin: 2.6 g/dL — ABNORMAL LOW (ref 3.5–5.0)
Alkaline Phosphatase: 70 U/L (ref 38–126)
Anion gap: 6 (ref 5–15)
BUN: 9 mg/dL (ref 8–23)
CO2: 26 mmol/L (ref 22–32)
Calcium: 7.6 mg/dL — ABNORMAL LOW (ref 8.9–10.3)
Chloride: 105 mmol/L (ref 98–111)
Creatinine, Ser: 0.55 mg/dL (ref 0.44–1.00)
GFR, Estimated: >60 mL/min (ref >60)
Glucose, Bld: 102 mg/dL — ABNORMAL HIGH (ref 70–99)
Potassium: 2.7 mmol/L — CL (ref 3.5–5.1)
Sodium: 137 mmol/L (ref 135–145)
Total Bilirubin: 0.8 mg/dL (ref 0.3–1.2)
Total Protein: 5.7 g/dL — ABNORMAL LOW (ref 6.5–8.1)

## 2022-01-31 LAB — BLOOD CULTURE ID PANEL (REFLEXED) - BCID2

## 2022-01-31 LAB — HIV ANTIBODY (ROUTINE TESTING W REFLEX): HIV Screen 4th Generation wRfx: NONREACTIVE

## 2022-01-31 LAB — CBC
HCT: 33.1 % — ABNORMAL LOW (ref 36.0–46.0)
Hemoglobin: 10.8 g/dL — ABNORMAL LOW (ref 12.0–15.0)
MCH: 32 pg (ref 26.0–34.0)
MCHC: 32.6 g/dL (ref 30.0–36.0)
MCV: 98.2 fL (ref 80.0–100.0)
Platelets: 236 10*3/uL (ref 150–400)
RBC: 3.37 MIL/uL — ABNORMAL LOW (ref 3.87–5.11)
RDW: 12.4 % (ref 11.5–15.5)
WBC: 14.7 10*3/uL — ABNORMAL HIGH (ref 4.0–10.5)
nRBC: 0 % (ref 0.0–0.2)

## 2022-01-31 LAB — LACTIC ACID, PLASMA: Lactic Acid, Venous: 0.9 mmol/L (ref 0.5–1.9)

## 2022-01-31 LAB — D-DIMER, QUANTITATIVE: D-Dimer, Quant: 0.76 ug/mL-FEU — ABNORMAL HIGH (ref 0.00–0.50)

## 2022-01-31 MED ORDER — SODIUM CHLORIDE 0.9 % IV SOLN: INTRAVENOUS | Status: DC

## 2022-01-31 MED ORDER — FAMOTIDINE 20 MG PO TABS
20.0000 mg | ORAL_TABLET | Freq: Two times a day (BID) | ORAL | Status: DC
  Administered 2022-01-31 – 2022-02-03 (×8): 20 mg via ORAL
  Filled 2022-01-31 (×8): qty 1

## 2022-01-31 MED ORDER — ATORVASTATIN CALCIUM 20 MG PO TABS
40.0000 mg | ORAL_TABLET | Freq: Every day | ORAL | Status: DC
  Administered 2022-01-31 – 2022-02-02 (×4): 40 mg via ORAL
  Filled 2022-01-31 (×4): qty 2

## 2022-01-31 MED ORDER — METRONIDAZOLE 500 MG/100ML IV SOLN
500.0000 mg | Freq: Two times a day (BID) | INTRAVENOUS | Status: DC
  Administered 2022-01-31 – 2022-02-03 (×7): 500 mg via INTRAVENOUS
  Filled 2022-01-31 (×10): qty 100

## 2022-01-31 MED ORDER — DIPHENHYDRAMINE HCL 25 MG PO CAPS
25.0000 mg | ORAL_CAPSULE | Freq: Four times a day (QID) | ORAL | Status: DC | PRN
  Administered 2022-01-31 – 2022-02-01 (×2): 25 mg via ORAL
  Filled 2022-01-31 (×2): qty 1

## 2022-01-31 MED ORDER — POTASSIUM CHLORIDE CRYS ER 20 MEQ PO TBCR: 40.0000 meq | EXTENDED_RELEASE_TABLET | Freq: Once | ORAL | Status: DC

## 2022-01-31 MED ORDER — SODIUM CHLORIDE 0.9 % IV SOLN
2.0000 g | Freq: Three times a day (TID) | INTRAVENOUS | Status: DC
  Administered 2022-01-31 (×2): 2 g via INTRAVENOUS
  Filled 2022-01-31 (×2): qty 2

## 2022-01-31 MED ORDER — POTASSIUM CHLORIDE 10 MEQ/100ML IV SOLN: 10.0000 meq | INTRAVENOUS | Status: DC

## 2022-01-31 MED ORDER — SODIUM CHLORIDE 0.9 % IV SOLN
2.0000 g | INTRAVENOUS | Status: DC
  Administered 2022-01-31 – 2022-02-03 (×4): 2 g via INTRAVENOUS
  Filled 2022-01-31 (×3): qty 2
  Filled 2022-01-31: qty 20

## 2022-01-31 MED ORDER — HYDROCHLOROTHIAZIDE 12.5 MG PO TABS
12.5000 mg | ORAL_TABLET | Freq: Every day | ORAL | Status: DC
  Administered 2022-01-31: 12.5 mg via ORAL
  Filled 2022-01-31: qty 1

## 2022-01-31 MED ORDER — FLUOXETINE HCL 20 MG PO CAPS
20.0000 mg | ORAL_CAPSULE | Freq: Every day | ORAL | Status: DC
  Administered 2022-01-31 – 2022-02-02 (×4): 20 mg via ORAL
  Filled 2022-01-31 (×4): qty 1

## 2022-01-31 MED ORDER — BACLOFEN 10 MG PO TABS
10.0000 mg | ORAL_TABLET | Freq: Every day | ORAL | Status: DC
  Administered 2022-01-31 – 2022-02-02 (×3): 10 mg via ORAL
  Filled 2022-01-31 (×3): qty 1

## 2022-01-31 MED ORDER — POTASSIUM CHLORIDE 10 MEQ/100ML IV SOLN
10.0000 meq | INTRAVENOUS | Status: AC
  Administered 2022-01-31 (×8): 10 meq via INTRAVENOUS
  Filled 2022-01-31 (×3): qty 100

## 2022-01-31 MED ORDER — SODIUM CHLORIDE 0.9 % IV SOLN: INTRAVENOUS | Status: DC | PRN

## 2022-01-31 MED ORDER — SODIUM CHLORIDE 0.9 % IV BOLUS (SEPSIS)
500.0000 mL | Freq: Once | INTRAVENOUS | Status: AC
  Administered 2022-01-31: 500 mL via INTRAVENOUS

## 2022-01-31 MED ORDER — MAGNESIUM OXIDE -MG SUPPLEMENT 400 (240 MG) MG PO TABS
400.0000 mg | ORAL_TABLET | Freq: Two times a day (BID) | ORAL | Status: DC
  Administered 2022-01-31 – 2022-02-01 (×3): 400 mg via ORAL
  Filled 2022-01-31 (×3): qty 1

## 2022-01-31 MED ORDER — ACETAMINOPHEN 500 MG PO TABS
500.0000 mg | ORAL_TABLET | Freq: Four times a day (QID) | ORAL | Status: DC | PRN
  Administered 2022-01-31: 500 mg via ORAL
  Filled 2022-01-31: qty 1

## 2022-01-31 NOTE — Progress Notes (Signed)
?  Progress Note ? ? ?Patient: Michelle Chandler IWL:[id number] DOB: [birth year] DOA: 01/30/2022     0 ?DOS: the patient was seen and examined on 01/31/2022 ?  ? ?Assessment and Plan: ?* Sepsis due to Escherichia coli (E. coli) ([place]) ?Present on admission.  Just got called with positive blood cultures with E. coli.  Patient also had fever, leukocytosis and tachycardia.  I suspect the urine source but could also be diverticulitis.  Maxipime switched over to Rocephin and will continue Flagyl for now. ? ?Diverticulitis ?CT scan concerning for diverticulitis.  Continue Rocephin and Flagyl for now. ? ?Acute cystitis with hematuria ?Urine culture pending but blood culture growing E. coli.  Continue Rocephin and follow-up urine cultures.  Follow-up sensitivities of blood cultures. ? ?Renal mass ?Right renal mass concerning for renal cell carcinoma.  Will need outpatient urology follow-up. ? ?Hypokalemia ?Replace potassium IV ? ?Cerebral palsy ([place]) ?Physical therapy evaluation ? ?Essential (primary) hypertension ?With sepsis we will hold antihypertensive medications. ? ?Hyponatremia ?Sodium 134 on presentation improved to 137. ? ? ? ? ?  ? ?Subjective: Patient not feeling well.  Came in with poor appetite and abdominal pain.  CT scan concerning for diverticulitis.  Urine analysis also positive.  Just got called with positive blood cultures of E. coli. ? ?Physical Exam: ?Vitals:  ? 01/30/22 2330 01/31/22 0435 01/31/22 0530 01/31/22 0900  ?BP: 119/65 (!) 112/50 103/61 106/72  ?Pulse: 81 87 91 80  ?Resp: '18 18 17 18  '$ ?Temp:  98.7 ?F (37.1 ?C)  98.8 ?F (37.1 ?C)  ?TempSrc: Oral Oral  Oral  ?SpO2: 96% 94% 92% 95%  ?Weight:      ?Height:      ? ?Physical Exam ?HENT:  ?   Head: Normocephalic.  ?   Mouth/Throat:  ?   Pharynx: No oropharyngeal exudate.  ?Eyes:  ?   General: Lids are normal.  ?   Conjunctiva/sclera: Conjunctivae normal.  ?Cardiovascular:  ?   Rate and Rhythm: Normal rate and regular rhythm.  ?   Heart sounds: Normal heart  sounds, S1 normal and S2 normal.  ?Pulmonary:  ?   Breath sounds: Normal breath sounds. No decreased breath sounds, wheezing, rhonchi or rales.  ?Abdominal:  ?   Palpations: Abdomen is soft.  ?   Tenderness: There is abdominal tenderness in the suprapubic area and left lower quadrant.  ?Musculoskeletal:  ?   Right lower leg: Swelling present.  ?   Left lower leg: Swelling present.  ?Skin: ?   General: Skin is warm.  ?   Findings: No rash.  ?Neurological:  ?   Mental Status: She is alert and oriented to person, place, and time.  ?  ?Data Reviewed: ?Laboratory radiological data reviewed from this hospital stay.  CT scan reviewed by me.  Showing renal mass and diverticulitis.  Urinalysis positive.  Potassium down to 2.7, white blood cell count 14.7.  Hemoglobin 10.8 ? ?Family Communication: Spoke with family at the bedside ? ?Disposition: ?Status is: Observation ?The patient will require care spanning > 2 midnights and should be moved to inpatient because: Patient has a positive blood culture and being treated for sepsis with IV antibiotics. ? ?Planned Discharge Destination: Home ? ?Author: ?Loletha Grayer, MD ?01/31/2022 12:45 PM ? ?For on call review [email or web address].  ?

## 2022-01-31 NOTE — ED Notes (Addendum)
Call from lab, critical result K+=2.7 ?

## 2022-01-31 NOTE — ED Notes (Signed)
Messaged provider Sharion Settler regarding patient's potassium of 2.7.  ?

## 2022-01-31 NOTE — Assessment & Plan Note (Addendum)
Present on admission.  Blood cultures with E. coli.  Patient also had fever, leukocytosis and tachycardia.  I suspect the urine source with E. coli growing out of urine culture also.  Temperature curve trending better.  Currently on Rocephin.  Likely can discharge home on Bactrim.  Patient still with poor appetite and not eating very much. ?

## 2022-01-31 NOTE — Progress Notes (Signed)
Pharmacy Antibiotic Note ? ?Michelle Chandler is a 66 y.o. female admitted on 01/30/2022 with  intra-abdominal .  Pharmacy has been consulted for Cefepime dosing. ? ?Plan: ?Cefepime 2 gm IV Q8H to start on 3/18 @ 0130.  ? ?Height: 5' (152.4 cm) ?Weight: 68 kg (150 lb) ?IBW/kg (Calculated) : 45.5 ? ?Temp (24hrs), Avg:99.2 ?F (37.3 ?C), Min:98.2 ?F (36.8 ?C), Max:100.8 ?F (38.2 ?C) ? ?Recent Labs  ?Lab 01/30/22 ?1224 01/30/22 ?1928  ?WBC 16.4* 17.5*  ?CREATININE 0.73 0.71  ?  ?Estimated Creatinine Clearance: 61.9 mL/min (by C-G formula based on SCr of 0.71 mg/dL).   ? ?No Known Allergies ? ?Antimicrobials this admission: ?  >>  ?  >>  ? ?Dose adjustments this admission: ? ? ?Microbiology results: ? BCx:  ? UCx:   ? Sputum:   ? MRSA PCR:  ? ?Thank you for allowing pharmacy to be a part of this patient?s care. ? ?Gudelia Eugene D ?01/31/2022 1:15 AM ? ?

## 2022-01-31 NOTE — ED Notes (Signed)
Patient's mom's name is Milayah Krell [phone]). She would like to be contacted with any updates. ?

## 2022-01-31 NOTE — Plan of Care (Signed)
?  Problem: Clinical Measurements: Goal: Cardiovascular complication will be avoided Outcome: Progressing   Problem: Nutrition: Goal: Adequate nutrition will be maintained Outcome: Progressing   Problem: Coping: Goal: Level of anxiety will decrease Outcome: Progressing   Problem: Pain Managment: Goal: General experience of comfort will improve Outcome: Progressing   Problem: Safety: Goal: Ability to remain free from injury will improve Outcome: Progressing   

## 2022-01-31 NOTE — Assessment & Plan Note (Addendum)
Sodium 135 today. ?

## 2022-01-31 NOTE — Assessment & Plan Note (Addendum)
Right renal mass concerning for renal cell carcinoma.  We will get an MRI with contrast of the abdomen to further clarify renal mass.  We will need outpatient urology follow-up ?

## 2022-01-31 NOTE — Assessment & Plan Note (Signed)
With sepsis we will hold antihypertensive medications. ?

## 2022-01-31 NOTE — Assessment & Plan Note (Addendum)
Physical therapy evaluation appreciated ?

## 2022-01-31 NOTE — Progress Notes (Signed)
?   01/31/22 1701  ?Assess: MEWS Score  ?Temp (!) 101.9 ?F (38.8 ?C)  ?BP 119/63  ?Pulse Rate 95  ?Resp 18  ?Level of Consciousness Alert  ?SpO2 93 % ?(rechecked)  ?O2 Device Room Air  ?Assess: MEWS Score  ?MEWS Temp 2  ?MEWS Systolic 0  ?MEWS Pulse 0  ?MEWS RR 0  ?MEWS LOC 0  ?MEWS Score 2  ?MEWS Score Color Yellow  ?Assess: if the MEWS score is Yellow or Red  ?Were vital signs taken at a resting state? Yes  ?Focused Assessment No change from prior assessment  ?MEWS guidelines implemented *See Row Information* Yes  ?Treat  ?MEWS Interventions Administered prn meds/treatments;Other (Comment) ?(alerted MD)  ?Pain Scale 0-10  ?Pain Score 0  ?Take Vital Signs  ?Increase Vital Sign Frequency  Yellow: Q 2hr X 2 then Q 4hr X 2, if remains yellow, continue Q 4hrs  ?Escalate  ?MEWS: Escalate Yellow: discuss with charge nurse/RN and consider discussing with provider and RRT  ?Notify: Charge Nurse/RN  ?Name of Charge Nurse/RN Notified Sam RN  ?Date Charge Nurse/RN Notified 01/31/22  ?Time Charge Nurse/RN Notified 1724  ?Notify: Provider  ?Provider Name/Title Dr. Leslye Peer  ?Date Provider Notified 01/31/22  ?Time Provider Notified 1724  ?Notification Type Call  ?Notification Reason Change in status  ?Provider response No new orders;Other (Comment) ?(blood cx positive/administering IV abx)  ?Date of Provider Response 01/31/22  ?Time of Provider Response 1725  ?Assess: SIRS CRITERIA  ?SIRS Temperature  1  ?SIRS Pulse 1  ?SIRS Respirations  0  ?SIRS WBC 0  ?SIRS Score Sum  2  ? ? ?

## 2022-01-31 NOTE — Assessment & Plan Note (Signed)
CT scan concerning for diverticulitis.  Continue Rocephin and Flagyl for now. ?

## 2022-01-31 NOTE — ED Notes (Signed)
Blue top and lactic obtained and sent to lab at this time. ?

## 2022-01-31 NOTE — ED Notes (Signed)
Changed patient's diaper which was soaked. Patient states that she is continent and usually gets around with walker but has had decreased mobility due to swollen right leg. States that she was asleep and had an accident while asleep. Changed linen, pan and placed in gown. Denied additional needs. ?

## 2022-01-31 NOTE — Assessment & Plan Note (Addendum)
Blood and urine cultures have E. coli.  Continue Rocephin. ?

## 2022-01-31 NOTE — Assessment & Plan Note (Addendum)
Replace potassium in IV fluids. ?

## 2022-01-31 NOTE — Progress Notes (Signed)
PHARMACY - PHYSICIAN COMMUNICATION ?CRITICAL VALUE ALERT - BLOOD CULTURE IDENTIFICATION (BCID) ? ?Michelle Chandler is an 66 y.o. female who presented to [facility] on 01/30/2022 with a chief complaint of IAI ? ?Assessment:    Blood cx 1 of 4 bottles (anaerobic) + for enterobacterales,  BCID: E.coli with no resistance ? ?Name of physician (or Provider) Contacted: Dr. Leslye Peer ? ?Current antibiotics: cefepime/metronidazole ? ?Changes to prescribed antibiotics recommended:  ?Deescalate cefepime to Ceftriaxone 2 gm IV q24h. Continue metronidazole with possible IAI ? ?Recommendations accepted by provider ? ?Results for orders placed or performed during the hospital encounter of 01/30/22  ?Blood Culture ID Panel (Reflexed) (Collected: 01/30/2022  6:38 PM)  ?Result Value Ref Range  ? Enterococcus faecalis NOT DETECTED NOT DETECTED  ? Enterococcus Faecium NOT DETECTED NOT DETECTED  ? Listeria monocytogenes NOT DETECTED NOT DETECTED  ? Staphylococcus species NOT DETECTED NOT DETECTED  ? Staphylococcus aureus (BCID) NOT DETECTED NOT DETECTED  ? Staphylococcus epidermidis NOT DETECTED NOT DETECTED  ? Staphylococcus lugdunensis NOT DETECTED NOT DETECTED  ? Streptococcus species NOT DETECTED NOT DETECTED  ? Streptococcus agalactiae NOT DETECTED NOT DETECTED  ? Streptococcus pneumoniae NOT DETECTED NOT DETECTED  ? Streptococcus pyogenes NOT DETECTED NOT DETECTED  ? A.calcoaceticus-baumannii NOT DETECTED NOT DETECTED  ? Bacteroides fragilis NOT DETECTED NOT DETECTED  ? Enterobacterales DETECTED (A) NOT DETECTED  ? Enterobacter cloacae complex NOT DETECTED NOT DETECTED  ? Escherichia coli DETECTED (A) NOT DETECTED  ? Klebsiella aerogenes NOT DETECTED NOT DETECTED  ? Klebsiella oxytoca NOT DETECTED NOT DETECTED  ? Klebsiella pneumoniae NOT DETECTED NOT DETECTED  ? Proteus species NOT DETECTED NOT DETECTED  ? Salmonella species NOT DETECTED NOT DETECTED  ? Serratia marcescens NOT DETECTED NOT DETECTED  ? Haemophilus influenzae NOT DETECTED  NOT DETECTED  ? Neisseria meningitidis NOT DETECTED NOT DETECTED  ? Pseudomonas aeruginosa NOT DETECTED NOT DETECTED  ? Stenotrophomonas maltophilia NOT DETECTED NOT DETECTED  ? Candida albicans NOT DETECTED NOT DETECTED  ? Candida auris NOT DETECTED NOT DETECTED  ? Candida glabrata NOT DETECTED NOT DETECTED  ? Candida krusei NOT DETECTED NOT DETECTED  ? Candida parapsilosis NOT DETECTED NOT DETECTED  ? Candida tropicalis NOT DETECTED NOT DETECTED  ? Cryptococcus neoformans/gattii NOT DETECTED NOT DETECTED  ? CTX-M ESBL NOT DETECTED NOT DETECTED  ? Carbapenem resistance IMP NOT DETECTED NOT DETECTED  ? Carbapenem resistance KPC NOT DETECTED NOT DETECTED  ? Carbapenem resistance NDM NOT DETECTED NOT DETECTED  ? Carbapenem resist OXA 48 LIKE NOT DETECTED NOT DETECTED  ? Carbapenem resistance VIM NOT DETECTED NOT DETECTED  ? ? ?Loredana Medellin A ?01/31/2022  11:58 AM ? ?

## 2022-02-01 DIAGNOSIS — A4151 Sepsis due to Escherichia coli [E. coli]: Secondary | ICD-10-CM | POA: Diagnosis not present

## 2022-02-01 DIAGNOSIS — E871 Hypo-osmolality and hyponatremia: Secondary | ICD-10-CM

## 2022-02-01 DIAGNOSIS — N2889 Other specified disorders of kidney and ureter: Secondary | ICD-10-CM | POA: Diagnosis not present

## 2022-02-01 DIAGNOSIS — N3001 Acute cystitis with hematuria: Secondary | ICD-10-CM | POA: Diagnosis not present

## 2022-02-01 DIAGNOSIS — K5792 Diverticulitis of intestine, part unspecified, without perforation or abscess without bleeding: Secondary | ICD-10-CM | POA: Diagnosis not present

## 2022-02-01 LAB — BASIC METABOLIC PANEL
Anion gap: 6 (ref 5–15)
BUN: 7 mg/dL — ABNORMAL LOW (ref 8–23)
CO2: 26 mmol/L (ref 22–32)
Calcium: 8.6 mg/dL — ABNORMAL LOW (ref 8.9–10.3)
Chloride: 102 mmol/L (ref 98–111)
Creatinine, Ser: 0.61 mg/dL (ref 0.44–1.00)
GFR, Estimated: >60 mL/min (ref >60)
Glucose, Bld: 103 mg/dL — ABNORMAL HIGH (ref 70–99)
Potassium: 3.3 mmol/L — ABNORMAL LOW (ref 3.5–5.1)
Sodium: 134 mmol/L — ABNORMAL LOW (ref 135–145)

## 2022-02-01 LAB — CBC
HCT: 37.5 % (ref 36.0–46.0)
Hemoglobin: 12.4 g/dL (ref 12.0–15.0)
MCH: 31.6 pg (ref 26.0–34.0)
MCHC: 33.1 g/dL (ref 30.0–36.0)
MCV: 95.7 fL (ref 80.0–100.0)
Platelets: 272 10*3/uL (ref 150–400)
RBC: 3.92 MIL/uL (ref 3.87–5.11)
RDW: 12.2 % (ref 11.5–15.5)
WBC: 11.5 10*3/uL — ABNORMAL HIGH (ref 4.0–10.5)
nRBC: 0 % (ref 0.0–0.2)

## 2022-02-01 LAB — MAGNESIUM: Magnesium: 1.9 mg/dL (ref 1.7–2.4)

## 2022-02-01 MED ORDER — MAGNESIUM OXIDE -MG SUPPLEMENT 400 (240 MG) MG PO TABS
400.0000 mg | ORAL_TABLET | Freq: Every day | ORAL | Status: DC
  Administered 2022-02-02: 400 mg via ORAL
  Filled 2022-02-01: qty 1

## 2022-02-01 MED ORDER — POTASSIUM CHLORIDE CRYS ER 20 MEQ PO TBCR
40.0000 meq | EXTENDED_RELEASE_TABLET | Freq: Once | ORAL | Status: AC
  Administered 2022-02-01: 40 meq via ORAL
  Filled 2022-02-01: qty 2

## 2022-02-01 MED ORDER — POTASSIUM CHLORIDE IN NACL 20-0.9 MEQ/L-% IV SOLN
INTRAVENOUS | Status: DC
  Filled 2022-02-01 (×4): qty 1000

## 2022-02-01 NOTE — Plan of Care (Signed)
?  Problem: Clinical Measurements: ?Goal: Will remain free from infection ?Outcome: Not Progressing ?  ?Problem: Clinical Measurements: ?Goal: Diagnostic test results will improve ?Outcome: Not Progressing ?  ?Problem: Activity: ?Goal: Risk for activity intolerance will decrease ?Outcome: Not Progressing ?  ?Problem: Nutrition: ?Goal: Adequate nutrition will be maintained ?Outcome: Not Progressing ?  ?

## 2022-02-01 NOTE — Progress Notes (Signed)
?  Progress Note ? ? ?Patient: Michelle Chandler DUK:[id number] DOB: Oct 08, 1956 DOA: 01/30/2022     1 ?DOS: the patient was seen and examined on 02/01/2022 ?  ? ? ?Assessment and Plan: ?* Sepsis due to Escherichia coli (E. coli) ([place]) ?Present on admission.  Blood cultures with E. coli.  Patient also had fever, leukocytosis and tachycardia.  I suspect the urine source with E. coli growing out of urine culture also.  Patient spiked a fever of 101.9 yesterday afternoon.  Need to see temperature curve did come down and sensitivities of blood and urine cultures prior to disposition.  Currently on Rocephin. ? ?Diverticulitis ?CT scan concerning for diverticulitis.  Continue Rocephin and Flagyl for now. ? ?Acute cystitis with hematuria ?Blood and urine cultures have E. coli.  Awaiting sensitivities. ? ?Renal mass ?Right renal mass concerning for renal cell carcinoma.  Will need outpatient urology follow-up. ? ?Hypokalemia ?Replace potassium oral and with IV fluids ? ?Cerebral palsy ([place]) ?Physical therapy evaluation appreciated ? ?Essential (primary) hypertension ?With sepsis we will hold antihypertensive medications. ? ?Hypomagnesemia ?Magnesium replaced yesterday.  We will cut back on oral supplementation since having diarrhea ? ?Hyponatremia ?Sodium 134 today. ? ? ? ? ?  ? ?Subjective: Patient still feeling very weak.  Was seen after physical therapy walked around the gutter in the chair.  Still with not much appetite and not eating well.  Admitted with sepsis.  Patient complained of an episode of diarrhea this morning. ? ?Physical Exam: ?Vitals:  ? 01/31/22 1921 01/31/22 2342 02/01/22 0343 02/01/22 3762  ?BP: 121/61 (!) 120/51 104/61 111/61  ?Pulse: 86 89 86 87  ?Resp: '18 16 20 16  '$ ?Temp: 98.1 ?F (36.7 ?C) 99.9 ?F (37.7 ?C) 98.2 ?F (36.8 ?C) 99 ?F (37.2 ?C)  ?TempSrc: Oral Oral  Oral  ?SpO2: 100%  93% 93%  ?Weight:      ?Height:      ? ?Physical Exam ?HENT:  ?   Head: Normocephalic.  ?   Mouth/Throat:  ?   Pharynx: No  oropharyngeal exudate.  ?Eyes:  ?   General: Lids are normal.  ?   Conjunctiva/sclera: Conjunctivae normal.  ?Cardiovascular:  ?   Rate and Rhythm: Normal rate and regular rhythm.  ?   Heart sounds: Normal heart sounds, S1 normal and S2 normal.  ?Pulmonary:  ?   Breath sounds: Normal breath sounds. No decreased breath sounds, wheezing, rhonchi or rales.  ?Abdominal:  ?   Palpations: Abdomen is soft.  ?   Tenderness: There is abdominal tenderness in the suprapubic area and left lower quadrant.  ?Musculoskeletal:  ?   Right lower leg: Swelling present.  ?   Left lower leg: Swelling present.  ?Skin: ?   General: Skin is warm.  ?   Findings: No rash.  ?Neurological:  ?   Mental Status: She is alert and oriented to person, place, and time.  ?  ?Data Reviewed: ?Blood cultures and urine cultures positive for E. coli.  Sodium 134, potassium 3.3, white blood cell count 11.5, hemoglobin 12.4 ? ?Family Communication: Spoke with patient's mother at the bedside ? ?Disposition: ?Status is: Inpatient ?Remains inpatient appropriate because: Requires IV antibiotics secondary to E. coli in the urine and blood cultures.  Need to watch temperature curve come down and see sensitivities prior to any disposition. ? ?Planned Discharge Destination: Home with home health ? ?Author: ?Loletha Grayer, MD ?02/01/2022 1:57 PM ? ?For on call review [email or web address].  ?

## 2022-02-01 NOTE — Assessment & Plan Note (Addendum)
Magnesium replaced.  We will discontinue oral supplementation of magnesium secondary to diarrhea ?

## 2022-02-01 NOTE — Evaluation (Signed)
Physical Therapy Evaluation ?Patient Details ?Name: Michelle Chandler ?MRN: [id number] ?DOB: January 26, 1956 ?Today's Date: 02/01/2022 ? ?History of Present Illness ? Pt is a 66 y/o F admitted on 01/30/22 after presenting with c/c of abdominal pain. Pt found to have diverticulitis on CT scan & being treated for sepsis 2/2 E. coli. PMH: cerebral palsy, ataxia, depression, HLD, HTN, vertigo, venous insufficiency, tobacco use  ?Clinical Impression ? Pt seen for PT evaluation with pt's mother present for session. Pt reports prior to admission she required quite a bit of help either from her personal care aide or her mother. Pt was able to ambulate short household distances with RW & assistance. On this date, pt requires mod assist for bed mobility with reliance on hospital bed features, mod assist STS, and min assist to step to recliner. Pt demonstrates tone in BLE that impair movement. MD entered room to assess pt & pt left in recliner. Will continue to follow pt acutely to progress gait with LRAD.   ?   ? ?Recommendations for follow up therapy are one component of a multi-disciplinary discharge planning process, led by the attending physician.  Recommendations may be updated based on patient status, additional functional criteria and insurance authorization. ? ?Follow Up Recommendations Home health PT ? ?  ?Assistance Recommended at Discharge Intermittent Supervision/Assistance  ?Patient can return home with the following ? A lot of help with walking and/or transfers;A lot of help with bathing/dressing/bathroom;Assist for transportation;Assistance with cooking/housework;Help with stairs or ramp for entrance ? ?  ?Equipment Recommendations None recommended by PT  ?Recommendations for Other Services ?    ?  ?Functional Status Assessment Patient has had a recent decline in their functional status and demonstrates the ability to make significant improvements in function in a reasonable and predictable amount of time.  ? ?  ?Precautions  / Restrictions Precautions ?Precautions: Fall ?Restrictions ?Weight Bearing Restrictions: No  ? ?  ? ?Mobility ? Bed Mobility ?Overal bed mobility: Needs Assistance ?Bed Mobility: Supine to Sit ?  ?  ?Supine to sit: Mod assist, HOB elevated ?  ?  ?General bed mobility comments: Assistance to transfer BLE to EOB, use of bed rails, HOB elevated & extra time to upright trunk ?  ? ?Transfers ?Overall transfer level: Needs assistance ?Equipment used: Rolling walker (2 wheels) ?Transfers: Sit to/from Stand, Bed to chair/wheelchair/BSC ?Sit to Stand: Mod assist (Pt demonstrates poor awareness of hand placement as she pulls up on RW but apparently transfers in this manner at home.) ?  ?Step pivot transfers: Min assist (extra time to take small steps towards recliner) ?  ?  ?  ?  ?  ? ?Ambulation/Gait ?  ?  ?  ?  ?  ?  ?  ?  ? ?Stairs ?  ?  ?  ?  ?  ? ?Wheelchair Mobility ?  ? ?Modified Rankin (Stroke Patients Only) ?  ? ?  ? ?Balance Overall balance assessment: Needs assistance ?Sitting-balance support: Feet supported, Bilateral upper extremity supported ?Sitting balance-Leahy Scale: Fair ?  ?  ?Standing balance support: During functional activity, Reliant on assistive device for balance ?Standing balance-Leahy Scale: Poor ?  ?  ?  ?  ?  ?  ?  ?  ?  ?  ?  ?  ?   ? ? ? ?Pertinent Vitals/Pain Pain Assessment ?Pain Assessment: No/denies pain  ? ? ?Home Living Family/patient expects to be discharged to:: Private residence ?Living Arrangements: Parent;Other relatives (Personal care aide 6 hrs/day, 5 days/week) ?  Available Help at Discharge: Family;Available 24 hours/day (mother provides as much physical assistance as she can but she is elderly & uses a RW herself) ?Type of Home: House ?Home Access: Ramped entrance ?  ?  ?  ?Home Layout: One level ?Home Equipment: Conservation officer, nature (2 wheels);Hospital bed;BSC/3in1 (transport w/c) ?   ?  ?Prior Function Prior Level of Function : Needs assist ?  ?  ?  ?Physical Assist : Mobility  (physical);ADLs (physical) ?Mobility (physical): Bed mobility;Transfers;Gait ?  ?Mobility Comments: Pt has a personal care aide 6 hours/day, 5 days/week, otherwise her mother assists her to the best of her ability. Pt requires assistance for bed mobility, transfers, & short distance gait with RW in the home. ?  ?  ? ? ?Hand Dominance  ?   ? ?  ?Extremity/Trunk Assessment  ? Upper Extremity Assessment ?Upper Extremity Assessment: Generalized weakness ?  ? ?Lower Extremity Assessment ?Lower Extremity Assessment: Generalized weakness (Pt with increased tone in BLE) ?  ? ?   ?Communication  ?    ?Cognition   ?Behavior During Therapy: [facility] for tasks assessed/performed ?Overall Cognitive Status: Within Functional Limits for tasks assessed ?  ?  ?  ?  ?  ?  ?  ?  ?  ?  ?  ?  ?  ?  ?  ?  ?General Comments: Pleasant lady, able to instruct PT on how to assist her based on how she performs tasks at home. ?  ?  ? ?  ?General Comments   ? ?  ?Exercises    ? ?Assessment/Plan  ?  ?PT Assessment Patient needs continued PT services  ?PT Problem List Decreased strength;Decreased mobility;Decreased range of motion;Decreased coordination;Decreased activity tolerance;Cardiopulmonary status limiting activity;Decreased balance ? ?   ?  ?PT Treatment Interventions DME instruction;Therapeutic exercise;Gait training;Balance training;Stair training;Neuromuscular re-education;Functional mobility training;Therapeutic activities;Patient/family education;Manual techniques;Modalities   ? ?PT Goals (Current goals can be found in the Care Plan section)  ?Acute Rehab PT Goals ?Patient Stated Goal: get better ?PT Goal Formulation: With patient/family ?Time For Goal Achievement: 02/15/22 ?Potential to Achieve Goals: Good ? ?  ?Frequency Min 2X/week ?  ? ? ?Co-evaluation   ?  ?  ?  ?  ? ? ?  ?AM-PAC PT "6 Clicks" Mobility  ?Outcome Measure Help needed turning from your back to your side while in a flat bed without using bedrails?: A Lot ?Help needed  moving from lying on your back to sitting on the side of a flat bed without using bedrails?: Total ?Help needed moving to and from a bed to a chair (including a wheelchair)?: A Little ?Help needed standing up from a chair using your arms (e.g., wheelchair or bedside chair)?: A Lot ?Help needed to walk in hospital room?: A Lot ?Help needed climbing 3-5 steps with a railing? : Total ?6 Click Score: 11 ? ?  ?End of Session   ?Activity Tolerance: Patient tolerated treatment well ?Patient left: in chair;with call bell/phone within reach;with family/visitor present (MD in room) ?Nurse Communication: Mobility status ?PT Visit Diagnosis: Muscle weakness (generalized) (M62.81);Difficulty in walking, not elsewhere classified (R26.2);Other abnormalities of gait and mobility (R26.89) ?  ? ?Time: 1040-1053 ?PT Time Calculation (min) (ACUTE ONLY): 13 min ? ? ?Charges:   PT Evaluation ?$PT Eval Moderate Complexity: 1 Mod ?  ?  ?   ? ? ?Lavone Nian, PT, DPT ?02/01/22, 1:49 PM ? ? ?Waunita Schooner ?02/01/2022, 1:48 PM ? ?

## 2022-02-01 NOTE — TOC Initial Note (Signed)
Transition of Care (TOC) - Initial/Assessment Note  ? ? ?Patient Details  ?Name: Michelle Chandler ?MRN: [id number] ?Date of Birth: 02-24-1956 ? ?Transition of Care (TOC) CM/SW Contact:    ?Shree Espey E Nelsy Madonna, LCSW ?Phone Number: ?02/01/2022, 3:46 PM ? ?Clinical Narrative:                Spoke to patient regarding DC planning and PT recs.  ?Patient lives with her Mother who provides transportation. PCP is Dr. Neomia Dear. Pharmacy is [facility]. Patient has a RW at home. Confirmed home address.  ?Patient is agreeable to HHPT, no agency preference. Referral made to [facility] with [place].  ? ? ?Expected Discharge Plan: [place] ?Barriers to Discharge: Continued Medical Work up ? ? ?Patient Goals and CMS Choice ?Patient states their goals for this hospitalization and ongoing recovery are:: home with home health ?CMS Medicare.gov Compare Post Acute Care list provided to:: Patient ?Choice offered to / list presented to : Patient ? ?Expected Discharge Plan and Services ?Expected Discharge Plan: [place] ?  ?  ?  ?Living arrangements for the past 2 months: [place] ?                ?  ?  ?  ?  ?  ?HH Arranged: PT ?[place] Agency: [place] ([place]) ?Date HH Agency Contacted: 02/01/22 ?  ?Representative spoke with at [place]: Corene Cornea ? ?Prior Living Arrangements/Services ?Living arrangements for the past 2 months: [place] ?Lives with:: Parents ?Patient language and need for interpreter reviewed:: Yes ?Do you feel safe going back to the place where you live?: Yes      ?Need for Family Participation in Patient Care: Yes (Comment) ?Care giver support system in place?: Yes (comment) ?  ?Criminal Activity/Legal Involvement Pertinent to Current Situation/Hospitalization: No - Comment as needed ? ?Activities of Daily Living ?Home Assistive Devices/Equipment: Gilford Rile (specify type) ?ADL Screening (condition at time of admission) ?Patient's cognitive ability adequate to safely  complete daily activities?: Yes ?Is the patient deaf or have difficulty hearing?: No ?Does the patient have difficulty seeing, even when wearing glasses/contacts?: No ?Does the patient have difficulty concentrating, remembering, or making decisions?: No ?Patient able to express need for assistance with ADLs?: Yes ?Does the patient have difficulty dressing or bathing?: Yes ?Independently performs ADLs?: Yes (appropriate for developmental age) ?Does the patient have difficulty walking or climbing stairs?: Yes ?Weakness of Legs: Both ?Weakness of Arms/Hands: Both ? ?Permission Sought/Granted ?Permission sought to share information with : Customer service manager ?Permission granted to share information with : Yes, Verbal Permission Granted ?   ? Permission granted to share info w AGENCY: [place] agencies ?   ?   ? ?Emotional Assessment ?  ?  ?  ?Orientation: : Oriented to Self, Oriented to Place, Oriented to  Time, Oriented to Situation ?Alcohol / Substance Use: Not Applicable ?Psych Involvement: No (comment) ? ?Admission diagnosis:  Diverticulitis [K57.92] ?Renal mass [N28.89] ?E. coli sepsis ([place]) [A41.51] ?Abdominal pain [R10.9] ?Patient Active Problem List  ? Diagnosis Date Noted  ? Hypomagnesemia 02/01/2022  ? Hypokalemia 01/31/2022  ? Hyponatremia 01/31/2022  ? Acute respiratory failure with hypoxia ([place]) 01/31/2022  ? Diverticulitis 01/31/2022  ? Sepsis due to Escherichia coli (E. coli) ([place]) 01/31/2022  ? Acute cystitis with hematuria 01/31/2022  ? Renal mass 01/31/2022  ? E. coli sepsis ([place]) 01/31/2022  ? Abdominal pain 01/30/2022  ? Need for influenza vaccination 09/16/2021  ? Anxiety  06/12/2021  ? Osteoporosis 06/12/2021  ? Prediabetes 10/07/2020  ? Gastroesophageal reflux disease 06/10/2020  ? Smoking greater than 40 pack years 06/10/2020  ? Cerebral palsy ([employer]) 12/27/2015  ? Hyperlipidemia 12/27/2015  ? Essential (primary) hypertension 12/27/2015  ? Depression, recurrent ([place]) 11/20/2015  ? ?PCP:  Charlynne Cousins, MD ?Pharmacy:   ?[place], [place] - [address] ?[address] ?[place] [address] ?Phone: [phone] Fax: [phone] ? ?[place], [place] - [address] ?[place]  [address] ?Phone: [phone] Fax: [phone] ? ? ? ? ?Social Determinants of Health (SDOH) Interventions ?  ? ?Readmission Risk Interventions ?Readmission Risk Prevention Plan 02/01/2022  ?Post Dischage Appt Complete  ?Medication Screening Complete  ?Transportation Screening Complete  ?Some recent data might be hidden  ? ? ? ?

## 2022-02-01 NOTE — Plan of Care (Signed)
  Problem: Clinical Measurements: Goal: Cardiovascular complication will be avoided Outcome: Progressing   Problem: Nutrition: Goal: Adequate nutrition will be maintained Outcome: Progressing   Problem: Elimination: Goal: Will not experience complications related to bowel motility Outcome: Progressing   Problem: Pain Managment: Goal: General experience of comfort will improve Outcome: Progressing   Problem: Safety: Goal: Ability to remain free from injury will improve Outcome: Progressing   Problem: Skin Integrity: Goal: Risk for impaired skin integrity will decrease Outcome: Progressing   

## 2022-02-01 NOTE — Progress Notes (Signed)
Cross Cover ?One dose oral potassium 40 meq ordered for K+ 3.3 ?

## 2022-02-02 ENCOUNTER — Inpatient Hospital Stay: Payer: Medicare Other

## 2022-02-02 DIAGNOSIS — K5792 Diverticulitis of intestine, part unspecified, without perforation or abscess without bleeding: Secondary | ICD-10-CM | POA: Diagnosis not present

## 2022-02-02 DIAGNOSIS — N3001 Acute cystitis with hematuria: Secondary | ICD-10-CM | POA: Diagnosis not present

## 2022-02-02 LAB — CBC
HCT: 35.7 % — ABNORMAL LOW (ref 36.0–46.0)
Hemoglobin: 11.7 g/dL — ABNORMAL LOW (ref 12.0–15.0)
MCH: 31.7 pg (ref 26.0–34.0)
MCHC: 32.8 g/dL (ref 30.0–36.0)
MCV: 96.7 fL (ref 80.0–100.0)
Platelets: 292 10*3/uL (ref 150–400)
RBC: 3.69 MIL/uL — ABNORMAL LOW (ref 3.87–5.11)
RDW: 12.5 % (ref 11.5–15.5)
WBC: 8 10*3/uL (ref 4.0–10.5)
nRBC: 0 % (ref 0.0–0.2)

## 2022-02-02 LAB — HEMOGLOBIN A1C
Hgb A1c MFr Bld: 6.1 % — ABNORMAL HIGH (ref 4.8–5.6)
Mean Plasma Glucose: 128 mg/dL

## 2022-02-02 LAB — URINE CULTURE: Culture: >=100000 — AB

## 2022-02-02 LAB — BASIC METABOLIC PANEL
Anion gap: 5 (ref 5–15)
BUN: 8 mg/dL (ref 8–23)
CO2: 23 mmol/L (ref 22–32)
Calcium: 8.2 mg/dL — ABNORMAL LOW (ref 8.9–10.3)
Chloride: 107 mmol/L (ref 98–111)
Creatinine, Ser: 0.59 mg/dL (ref 0.44–1.00)
GFR, Estimated: >60 mL/min (ref >60)
Glucose, Bld: 123 mg/dL — ABNORMAL HIGH (ref 70–99)
Potassium: 3.7 mmol/L (ref 3.5–5.1)
Sodium: 135 mmol/L (ref 135–145)

## 2022-02-02 MED ORDER — LOPERAMIDE HCL 2 MG PO CAPS
2.0000 mg | ORAL_CAPSULE | Freq: Three times a day (TID) | ORAL | Status: DC | PRN
  Administered 2022-02-03: 2 mg via ORAL
  Filled 2022-02-02: qty 1

## 2022-02-02 MED ORDER — SUCRALFATE 1 GM/10ML PO SUSP
1.0000 g | Freq: Three times a day (TID) | ORAL | Status: DC
  Administered 2022-02-02 – 2022-02-03 (×5): 1 g via ORAL
  Filled 2022-02-02 (×5): qty 10

## 2022-02-02 MED ORDER — GADOBUTROL 1 MMOL/ML IV SOLN
6.0000 mL | Freq: Once | INTRAVENOUS | Status: AC | PRN
  Administered 2022-02-02: 6 mL via INTRAVENOUS

## 2022-02-02 NOTE — Evaluation (Signed)
Clinical/Bedside Swallow Evaluation ?Patient Details  ?Name: Michelle Chandler ?MRN: [id number] ?Date of Birth: 1956-10-07 ? ?Today's Date: 02/02/2022 ?Time: SLP Start Time (ACUTE ONLY): 0930 SLP Stop Time (ACUTE ONLY): 1005 ?SLP Time Calculation (min) (ACUTE ONLY): 35 min ? ?Past Medical History:  ?Past Medical History:  ?Diagnosis Date  ? Advance care planning 06/28/2019  ? Allergic rhinitis   ? Ataxia   ? Bursitis of shoulder 11/20/2015  ? Overview:  Last Assessment & Plan:  Discussed care and treatment shoulder bursitis with meloxicam  ? CP (cerebral palsy) ([place])   ? Depression   ? Diverticulitis   ? Diverticulosis   ? Fatigue 02/05/2020  ? Hyperlipidemia   ? Hypertension   ? IFG (impaired fasting glucose)   ? Venous insufficiency   ? Venous stasis ulcer ([place])   ? Vertigo   ? ?Past Surgical History:  ?Past Surgical History:  ?Procedure Laterality Date  ? ABDOMINAL HYSTERECTOMY    ? complete  ? LAPAROSCOPY    ? legs and hip    ? due to CP  ? ?HPI:  ?Per H&P "Michelle Chandler is a 66 y.o. female with medical history significant of cerebral palsy and    Pt had abdominal pain."  ?  ?Assessment / Plan / Recommendation  ?Clinical Impression ? Pt alert, pleasant, and cooperative. Endorses infrequent retrosternal globus sensation with solids and large pills. Noted hx of GERD. Denies s/sx oral or pharyngeal dysphagia. On room air. ? ?Per chart review, WBC and current temp WNL. No recent chest imaging. ? ?Oral motor exam completed and was functional. Pt noted difficulty consuming liquids via cup, at times, given reduced labial seal. Noted pt with dx of CP. No functional deficits appreciated this date. ? ?Pt observed with trials of solid, pureed, and thin liquids (via straw). Pt demonstrated a functional oral swallow. Pharyngeal swallow appeared [facility] per clinical assessment. No overt or subtle s/sx pharyngeal dysphagia. To palpation, pt with seemingly timely swallow initiation and seemingly adequate laryngeal elevation. Globus sensation was  not reproduced on today's evaluation.  ? ?Recommend continuation of current diet with safe swallowing strategies/aspiration precautions and reflux precautions as outlined below.  ? ?Consideration for GI consult/further esophageal work up if complaints worsen. Pt politely declines need for further work up for suspected esophageal dysphagia at this time.  ? ?SLP to sign off as pt has no acute SLP needs at this time.  ? ?Pt and RN made aware of diet recommendations, safe swallowing strategies/aspiration precautions/reflux precautions and SLP POC. Pt verbalized understanding/agreement.  ? ?SLP Visit Diagnosis: Dysphagia, unspecified (R13.10) ?   ?Aspiration Risk ? Mild aspiration risk  ?  ?Diet Recommendation Regular;Thin liquid  ? ?Medication Administration: Whole meds with liquid (consider halving, crushing, or utilizing a carrier for larger pills) ?Supervision: Patient able to self feed ?Compensations: Slow rate;Small sips/bites;Follow solids with liquid (masticate solids throroughly; REFLUX precautions) ?Postural Changes: Seated upright at 90 degrees;Remain upright for at least 30 minutes after po intake  ?  ?Other  Recommendations Recommended Consults: Consider GI evaluation (should complaints worsen) ?Oral Care Recommendations: Oral care QID;Patient independent with oral care (set up)   ? ?Recommendations for follow up therapy are one component of a multi-disciplinary discharge planning process, led by the attending physician.  Recommendations may be updated based on patient status, additional functional criteria and insurance authorization. ? ?Follow up Recommendations No SLP follow up  ? ? ?  ?Assistance Recommended at Discharge  (defer to PT/OT)  ?Functional Status Assessment Patient has not  had a recent decline in their functional status  ?   ? ?Prognosis Prognosis for Safe Diet Advancement: Good  ? ?  ? ?Swallow Study   ?General Date of Onset: 01/30/22 ?HPI: Per H&P "Michelle Chandler is a 66 y.o. female with  medical history significant of cerebral palsy and    Pt had abdominal pain." ?Type of Study: MBS-Modified Barium Swallow Study ?Previous Swallow Assessment: unknown ?Diet Prior to this Study: Regular;Thin liquids ?Temperature Spikes Noted: No ?Respiratory Status: Room air ?Behavior/Cognition: Alert;Cooperative;Pleasant mood ?Oral Cavity Assessment: Within Functional Limits ?Oral Care Completed by SLP: Recent completion by staff ?Oral Cavity - Dentition: Adequate natural dentition ?Vision: Functional for self-feeding ?Self-Feeding Abilities: Able to feed self ?Patient Positioning: Upright in bed ?Baseline Vocal Quality: Normal ?Volitional Cough: Strong ?Volitional Swallow: Able to elicit  ?  ?Oral/Motor/Sensory Function Overall Oral Motor/Sensory Function: Within functional limits   ?Thin Liquid Thin Liquid: Within functional limits ?Presentation: Straw  ?  ?Puree Puree: Within functional limits ?Presentation: Self Fed;Spoon   ?Solid ? ? ?  Solid: Within functional limits ?Presentation: Self Fed;Spoon  ? ?  ?Cherrie Gauze, M.S., CCC-SLP ?Speech-Language Pathologist ?[facility] ?[phone])  ? ?Quintella Baton ?02/02/2022,2:15 PM ? ? ? ?

## 2022-02-02 NOTE — Progress Notes (Signed)
?  Progress Note ? ? ?Patient: Michelle Chandler XMI:[id number] DOB: 15-Feb-1956 DOA: 01/30/2022     2 ?DOS: the patient was seen and examined on 02/02/2022 ? ? ?Assessment and Plan: ?* Sepsis due to Escherichia coli (E. coli) ([place]) ?Present on admission.  Blood cultures with E. coli.  Patient also had fever, leukocytosis and tachycardia.  I suspect the urine source with E. coli growing out of urine culture also.  Temperature curve trending better.  Currently on Rocephin.  Likely can discharge home on Bactrim.  Patient still with poor appetite and not eating very much. ? ?Diverticulitis ?CT scan concerning for diverticulitis.  Continue Rocephin and Flagyl for now. ? ?Acute cystitis with hematuria ?Blood and urine cultures have E. coli.  Continue Rocephin. ? ?Renal mass ?Right renal mass concerning for renal cell carcinoma.  We will get an MRI with contrast of the abdomen to further clarify renal mass.  We will need outpatient urology follow-up ? ?Hypokalemia ?Replace potassium in IV fluids. ? ?Cerebral palsy ([place]) ?Physical therapy evaluation appreciated ? ?Essential (primary) hypertension ?With sepsis we will hold antihypertensive medications. ? ?Hypomagnesemia ?Magnesium replaced.  We will discontinue oral supplementation of magnesium secondary to diarrhea ? ?Hyponatremia ?Sodium 135 today. ? ? ? ? ?  ? ?Subjective: Patient with poor appetite and not feeling well.  No abdominal pain.  Had 3 episodes of diarrhea yesterday.  Admitted with sepsis. ? ?Physical Exam: ?Vitals:  ? 02/01/22 8250 02/01/22 2053 02/02/22 0445 02/02/22 0734  ?BP: 111/61 (!) 122/57 (!) 109/57 106/62  ?Pulse: 87 75 76 69  ?Resp: '16 16 20 20  '$ ?Temp: 99 ?F (37.2 ?C) 98.4 ?F (36.9 ?C) 99.3 ?F (37.4 ?C) 98.6 ?F (37 ?C)  ?TempSrc: Oral Oral Oral   ?SpO2: 93% 100% 96% 97%  ?Weight:      ?Height:      ? ?Physical Exam ?HENT:  ?   Head: Normocephalic.  ?   Mouth/Throat:  ?   Pharynx: No oropharyngeal exudate.  ?Eyes:  ?   General: Lids are normal.  ?    Conjunctiva/sclera: Conjunctivae normal.  ?Cardiovascular:  ?   Rate and Rhythm: Normal rate and regular rhythm.  ?   Heart sounds: Normal heart sounds, S1 normal and S2 normal.  ?Pulmonary:  ?   Breath sounds: Normal breath sounds. No decreased breath sounds, wheezing, rhonchi or rales.  ?Abdominal:  ?   Palpations: Abdomen is soft.  ?   Tenderness: There is abdominal tenderness in the suprapubic area and left lower quadrant.  ?Musculoskeletal:  ?   Right lower leg: Swelling present.  ?   Left lower leg: Swelling present.  ?Skin: ?   General: Skin is warm.  ?   Findings: No rash.  ?Neurological:  ?   Mental Status: She is alert and oriented to person, place, and time.  ?  ?Data Reviewed: ?Creatinine 0.59, hemoglobin 11.7, white blood cell count 8.0 ? ?Family Communication: Spoke with patient's mother on phone ? ?Disposition: ?Status is: Inpatient ?Remains inpatient appropriate because: Still with poor appetite and not eating very much.  Continue to treat sepsis here in the hospital with IV antibiotics. ? ?Planned Discharge Destination: Home with home health ? ? ?Author: ?Loletha Grayer, MD ?02/02/2022 2:28 PM ? ?For on call review [email or web address].  ?

## 2022-02-02 NOTE — Progress Notes (Signed)
PT Cancellation Note ? ?Patient Details ?Name: Michelle Chandler ?MRN: [id number] ?DOB: 1956/10/29 ? ? ?Cancelled Treatment:    Reason Eval/Treat Not Completed: Patient at procedure or test/unavailable.  Pt currently not in her room (mobility specialist reporting pt off unit for imaging per RN).  Will re-attempt PT session at a later date/time. ? ?Leitha Bleak, PT ?02/02/22, 4:03 PM ? ?

## 2022-02-02 NOTE — Plan of Care (Signed)
?  Problem: Elimination: ?Goal: Will not experience complications related to bowel motility ?Outcome: Not Progressing ?  ?Problem: Clinical Measurements: ?Goal: Will remain free from infection ?Outcome: Not Progressing ?  ?Problem: Clinical Measurements: ?Goal: Respiratory complications will improve ?Outcome: Not Progressing ?  ?

## 2022-02-02 NOTE — Progress Notes (Signed)
Mobility Specialist - Progress Note ? ? 02/02/22 1558  ?Mobility  ?Activity Off unit  ? ? ? ?Per discussion with RN, pt off unit for MRI at this time. Will attempt session another date/time.  ? ? ?Kathee Delton ?Mobility Specialist ?02/02/22, 3:59 PM ? ? ? ? ?

## 2022-02-03 DIAGNOSIS — K5792 Diverticulitis of intestine, part unspecified, without perforation or abscess without bleeding: Secondary | ICD-10-CM | POA: Diagnosis not present

## 2022-02-03 DIAGNOSIS — A4151 Sepsis due to Escherichia coli [E. coli]: Secondary | ICD-10-CM | POA: Diagnosis not present

## 2022-02-03 DIAGNOSIS — N2889 Other specified disorders of kidney and ureter: Secondary | ICD-10-CM | POA: Diagnosis not present

## 2022-02-03 DIAGNOSIS — N3001 Acute cystitis with hematuria: Secondary | ICD-10-CM | POA: Diagnosis not present

## 2022-02-03 LAB — CULTURE, BLOOD (ROUTINE X 2): Special Requests: ADEQUATE

## 2022-02-03 MED ORDER — METRONIDAZOLE 500 MG PO TABS: 500.0000 mg | ORAL_TABLET | Freq: Two times a day (BID) | ORAL | 0 refills | Status: AC

## 2022-02-03 MED ORDER — SULFAMETHOXAZOLE-TRIMETHOPRIM 800-160 MG PO TABS: 1.0000 | ORAL_TABLET | Freq: Two times a day (BID) | ORAL | 0 refills | Status: AC

## 2022-02-03 MED ORDER — PANTOPRAZOLE SODIUM 40 MG PO TBEC: 40.0000 mg | DELAYED_RELEASE_TABLET | Freq: Every day | ORAL | 0 refills | Status: DC

## 2022-02-03 MED ORDER — LOPERAMIDE HCL 2 MG PO CAPS
2.0000 mg | ORAL_CAPSULE | Freq: Three times a day (TID) | ORAL | 0 refills | Status: DC | PRN
Start: 2022-02-03 — End: 2023-12-20

## 2022-02-03 MED ORDER — SUCRALFATE 1 GM/10ML PO SUSP: 1.0000 g | Freq: Three times a day (TID) | ORAL | 0 refills | Status: DC

## 2022-02-03 NOTE — TOC Transition Note (Signed)
Transition of Care (TOC) - CM/SW Discharge Note ? ? ?Patient Details  ?Name: Michelle Chandler ?MRN: [id number] ?Date of Birth: 1955/12/24 ? ?Transition of Care (TOC) CM/SW Contact:  ?Beverly Sessions, RN ?Phone Number: ?02/03/2022, 2:08 PM ? ? ?Clinical Narrative:    ? ?Patient to discharge today.  ?Corene Cornea with [place] notified  ? ?  ?Barriers to Discharge: Continued Medical Work up ? ? ?Patient Goals and CMS Choice ?Patient states their goals for this hospitalization and ongoing recovery are:: home with home health ?CMS Medicare.gov Compare Post Acute Care list provided to:: Patient ?Choice offered to / list presented to : Patient ? ?Discharge Placement ?  ?           ?  ?  ?  ?  ? ?Discharge Plan and Services ?  ?  ?           ?  ?  ?  ?  ?  ?HH Arranged: PT ?[place] Agency: [place] ([place]) ?Date HH Agency Contacted: 02/01/22 ?  ?Representative spoke with at [place]: Corene Cornea ? ?Social Determinants of Health (SDOH) Interventions ?  ? ? ?Readmission Risk Interventions ?Readmission Risk Prevention Plan 02/01/2022  ?Post Dischage Appt Complete  ?Medication Screening Complete  ?Transportation Screening Complete  ?Some recent data might be hidden  ? ? ? ? ? ?

## 2022-02-03 NOTE — Progress Notes (Signed)
Physical Therapy Treatment ?Patient Details ?Name: Michelle Chandler ?MRN: [id number] ?DOB: 08-24-1956 ?Today's Date: 02/03/2022 ? ? ?History of Present Illness Pt is a 66 y/o F admitted on 01/30/22 after presenting with c/c of abdominal pain. Pt found to have diverticulitis on CT scan & being treated for sepsis 2/2 E. coli. PMH: cerebral palsy, ataxia, depression, HLD, HTN, vertigo, venous insufficiency, tobacco use ? ?  ?PT Comments  ? ? Pt resting in bed upon PT arrival; agreeable to PT session.  Mod assist semi-supine to sitting edge of bed; mod assist standing from bed x2 trials up to RW; and min assist to ambulate a few feet bed to recliner with RW use (occasional assist to steady).  Pt's linens noted to be wet (even though purewick in place)--nurse and NT notified who came to assist with linen change and pt hygiene.  Will continue to focus on strengthening, balance, and progressive functional mobility during hospitalization. ?   ?Recommendations for follow up therapy are one component of a multi-disciplinary discharge planning process, led by the attending physician.  Recommendations may be updated based on patient status, additional functional criteria and insurance authorization. ? ?Follow Up Recommendations ? Home health PT ?  ?  ?Assistance Recommended at Discharge Intermittent Supervision/Assistance  ?Patient can return home with the following A lot of help with walking and/or transfers;A lot of help with bathing/dressing/bathroom;Assist for transportation;Assistance with cooking/housework;Help with stairs or ramp for entrance ?  ?Equipment Recommendations ? None recommended by PT (pt has needed DME at home)  ?  ?Recommendations for Other Services   ? ? ?  ?Precautions / Restrictions Precautions ?Precautions: Fall ?Restrictions ?Weight Bearing Restrictions: No  ?  ? ?Mobility ? Bed Mobility ?Overal bed mobility: Needs Assistance ?Bed Mobility: Supine to Sit ?  ?  ?Supine to sit: Mod assist, HOB elevated ?  ?   ?General bed mobility comments: assist for B LE's and trunk (pt reporting needing this assist at home as well prior to hospitalization) ?  ? ?Transfers ?Overall transfer level: Needs assistance ?Equipment used: Rolling walker (2 wheels) ?Transfers: Sit to/from Stand ?Sit to Stand: Mod assist ?  ?  ?  ?  ?  ?General transfer comment: x2 trials standing from bed; mod assist to stand from bed (pt preferring UE support on RW to stand); vc's for LE placement ?  ? ?Ambulation/Gait ?Ambulation/Gait assistance: Min assist ?Gait Distance (Feet): 3 Feet (bed to recliner) ?Assistive device: Rolling walker (2 wheels) ?  ?Gait velocity: decreased ?  ?  ?General Gait Details: increased effort and time to take steps; decreased B LE step length/foot clearance; assist to steady occasionally ? ? ?Stairs ?  ?  ?  ?  ?  ? ? ?Wheelchair Mobility ?  ? ?Modified Rankin (Stroke Patients Only) ?  ? ? ?  ?Balance Overall balance assessment: Needs assistance ?Sitting-balance support: Single extremity supported, Feet supported ?Sitting balance-Leahy Scale: Fair ?Sitting balance - Comments: steady static sitting ?  ?Standing balance support: During functional activity, Reliant on assistive device for balance ?Standing balance-Leahy Scale: Poor ?Standing balance comment: pt requiring UE support on RW for static and dynamic standing activities ?  ?  ?  ?  ?  ?  ?  ?  ?  ?  ?  ?  ? ?  ?Cognition Arousal/Alertness: Awake/alert ?Behavior During Therapy: [facility] for tasks assessed/performed ?Overall Cognitive Status: Within Functional Limits for tasks assessed ?  ?  ?  ?  ?  ?  ?  ?  ?  ?  ?  ?  ?  ?  ?  ?  ?  ?  ?  ? ?  ?  Exercises   ? ?  ?General Comments  Nursing cleared pt for participation in physical therapy.  Pt agreeable to PT session.  Pt's family member arrived during session. ?  ?  ? ?Pertinent Vitals/Pain Pain Assessment ?Pain Assessment: No/denies pain ?Vitals (HR and O2 on room air) stable and WFL throughout treatment session.  ? ? ?Home  Living   ?  ?  ?  ?  ?  ?  ?  ?  ?  ?   ?  ?Prior Function    ?  ?  ?   ? ?PT Goals (current goals can now be found in the care plan section) Acute Rehab PT Goals ?Patient Stated Goal: get better ?PT Goal Formulation: With patient/family ?Time For Goal Achievement: 02/15/22 ?Potential to Achieve Goals: Good ?Progress towards PT goals: Progressing toward goals ? ?  ?Frequency ? ? ? Min 2X/week ? ? ? ?  ?PT Plan Current plan remains appropriate  ? ? ?Co-evaluation   ?  ?  ?  ?  ? ?  ?AM-PAC PT "6 Clicks" Mobility   ?Outcome Measure ? Help needed turning from your back to your side while in a flat bed without using bedrails?: A Lot ?Help needed moving from lying on your back to sitting on the side of a flat bed without using bedrails?: A Lot ?Help needed moving to and from a bed to a chair (including a wheelchair)?: A Little ?Help needed standing up from a chair using your arms (e.g., wheelchair or bedside chair)?: A Lot ?Help needed to walk in hospital room?: A Lot ?Help needed climbing 3-5 steps with a railing? : Total ?6 Click Score: 12 ? ?  ?End of Session Equipment Utilized During Treatment: Gait belt ?Activity Tolerance: Patient tolerated treatment well ?Patient left: in chair;with call bell/phone within reach;with chair alarm set;with family/visitor present;Other (comment) (B heels floating via pillow support) ?Nurse Communication: Mobility status;Precautions ?PT Visit Diagnosis: Muscle weakness (generalized) (M62.81);Difficulty in walking, not elsewhere classified (R26.2);Other abnormalities of gait and mobility (R26.89) ?  ? ? ?Time: [phone] ?PT Time Calculation (min) (ACUTE ONLY): 27 min ? ?Charges:  $Therapeutic Activity: 23-37 mins          ?          ?Leitha Bleak, PT ?02/03/22, 10:11 AM ? ? ?

## 2022-02-03 NOTE — Care Management Important Message (Signed)
Important Message ? ?Patient Details  ?Name: Michelle Chandler ?MRN: [id number] ?Date of Birth: 1956-09-15 ? ? ?Medicare Important Message Given:  Yes ? ? ? ? ?[name] ?02/03/2022, 11:23 AM ?

## 2022-02-03 NOTE — Plan of Care (Signed)
?  Problem: Clinical Measurements: ?Goal: Ability to maintain clinical measurements within normal limits will improve ?Outcome: Progressing ?Goal: Will remain free from infection ?Outcome: Progressing ?Goal: Diagnostic test results will improve ?Outcome: Progressing ?Goal: Respiratory complications will improve ?Outcome: Progressing ?Goal: Cardiovascular complication will be avoided ?Outcome: Progressing ?  ?Pt is involved in and agrees with the plan of care. V/S stable. Denies any pain. ?

## 2022-02-03 NOTE — Discharge Summary (Signed)
?Physician Discharge Summary ?  ?Patient: Michelle Chandler MRN: [id number] DOB: Oct 07, 1956  ?Admit date:     01/30/2022  ?Discharge date: 02/03/22  ?Discharge Physician: Loletha Grayer  ? ?PCP: Charlynne Cousins, MD  ? ?Recommendations at discharge:  ? ?Follow-up PCP 5 days ?Follow-up Dr. Bernardo Heater urology for renal mass ?Follow-up gastroenterology as outpatient ? ? ?Discharge Diagnoses: ?Principal Problem: ?  Sepsis due to Escherichia coli (E. coli) ([place]) ?Active Problems: ?  Diverticulitis ?  Acute cystitis with hematuria ?  Hypokalemia ?  Renal mass ?  Cerebral palsy ([place]) ?  Essential (primary) hypertension ?  Gastroesophageal reflux disease ?  Smoking greater than 40 pack years ?  Abdominal pain ?  Hyponatremia ?  Acute respiratory failure with hypoxia ([place]) ?  E. coli sepsis ([place]) ?  Hypomagnesemia ? ? ? ?Hospital Course: ?The patient was admitted on 01/30/2022 and discharged on 02/03/2022.  The patient was admitted with clinical sepsis, acute cystitis and diverticulitis.  E. coli grew out of the blood and urine cultures.  The patient was given Rocephin and Flagyl while here in the hospital will be switched over to Bactrim and Flagyl upon discharge home.  Patient is antihypertensive blood pressure medications were held during the hospital course.  The patient was replaced on electrolytes magnesium and potassium during the hospital course. ? ?Assessment and Plan: ?* Sepsis due to Escherichia coli (E. coli) ([place]) ?Present on admission.  Blood cultures with E. coli.  Patient also had fever, leukocytosis and tachycardia.  Both urine and blood cultures were positive for E. coli.  The patient was given Rocephin here in the hospital and be switched over to Bactrim upon discharge home. ? ?Diverticulitis ?CT scan concerning for diverticulitis.  The patient was given Rocephin and Flagyl here in the hospital.  Will be switched over to Bactrim and Flagyl upon discharge ? ?Acute cystitis with hematuria ?Blood and urine cultures have E.  coli.  The patient received Rocephin while here in the hospital be switched over to Bactrim upon discharge ? ?Renal mass ?Right renal mass concerning for renal cell carcinoma on CT scan and MRI.  Will refer to urology as outpatient ? ?Hypokalemia ?Potassium was replaced and IV fluids ? ?Cerebral palsy ([place]) ?Physical therapy evaluation appreciated.  PT recommended home health. ? ?Essential (primary) hypertension ?With sepsis we will hold antihypertensive medications.  And follow-up appointment if blood pressure elevated can restart hydrochlorothiazide. ? ?Hypomagnesemia ?Magnesium replaced.  During the hospital course.  With diarrhea I stopped the oral magnesium ? ?Hyponatremia ?Last sodium 135. ? ?Diarrhea.  As needed Imodium. ? ? ? ?  ? ? ?Consultants: None ?Procedures performed: None ?Disposition: Home health ?Diet recommendation:  ?Regular diet ?DISCHARGE MEDICATION: ?Allergies as of 02/03/2022   ?No Known Allergies ?  ? ?  ?Medication List  ?  ? ?STOP taking these medications   ? ?acetaminophen 500 MG tablet ?Commonly known as: TYLENOL ?  ?hydrochlorothiazide 25 MG tablet ?Commonly known as: HYDRODIURIL ?  ?hydrOXYzine 25 MG capsule ?Commonly known as: VISTARIL ?  ? ?  ? ?TAKE these medications   ? ?atorvastatin 40 MG tablet ?Commonly known as: LIPITOR ?Take 1 tablet (40 mg total) by mouth daily. ?  ?baclofen 10 MG tablet ?Commonly known as: LIORESAL ?Take 1 tablet (10 mg total) by mouth every evening. ?  ?diphenhydrAMINE 25 MG tablet ?Commonly known as: BENADRYL ?Take 25 mg by mouth every 6 (six) hours as needed for itching or allergies. ?  ?famotidine 20 MG tablet ?Commonly known  as: PEPCID ?Take 1 tablet (20 mg total) by mouth 2 (two) times daily. ?  ?FLUoxetine 20 MG capsule ?Commonly known as: PROZAC ?Take 1 capsule (20 mg total) by mouth daily. ?  ?ibandronate 150 MG tablet ?Commonly known as: Boniva ?Take 1 tablet (150 mg total) by mouth every 30 (thirty) days. Take in the morning with a full glass of  water, on an empty stomach, and do not take anything else by mouth or lie down for the next 30 min. ?  ?ibuprofen 400 MG tablet ?Commonly known as: ADVIL ?Take 1 tablet (400 mg total) by mouth 3 (three) times daily as needed. ?  ?loperamide 2 MG capsule ?Commonly known as: IMODIUM ?Take 1 capsule (2 mg total) by mouth every 8 (eight) hours as needed for diarrhea or loose stools. ?  ?metroNIDAZOLE 500 MG tablet ?Commonly known as: Flagyl ?Take 1 tablet (500 mg total) by mouth 2 (two) times daily for 6 days. ?  ?pantoprazole 40 MG tablet ?Commonly known as: Protonix ?Take 1 tablet (40 mg total) by mouth daily. ?  ?Protective Underwear Large Misc ?by Does not apply route. ?  ?Entrust Plus Disp Underpads Misc ?by Does not apply route. ?  ?sucralfate 1 GM/10ML suspension ?Commonly known as: CARAFATE ?Take 10 mLs (1 g total) by mouth 4 (four) times daily -  with meals and at bedtime. ?  ?sulfamethoxazole-trimethoprim 800-160 MG tablet ?Commonly known as: BACTRIM DS ?Take 1 tablet by mouth 2 (two) times daily for 6 days. ?Start taking on: February 04, 2022 ?  ? ?  ? ? Follow-up Information   ? ? Abbie Sons, MD. Daphane Shepherd on 02/11/2022.   ?Specialty: Urology ?Why: 2:45pm appointment ?Contact information: ?[address] ?[address] ?[place] [address] ?[phone] ? ? ?  ?  ? ? Charlynne Cousins, MD Follow up on 02/05/2022.   ?Specialty: Internal Medicine ?Why: 10:20am appointment ?Contact information: ?[address] ?[place] [address] ?[phone] ? ? ?  ?  ? ? Lesly Rubenstein, MD Follow up in 4 day(s).   ?Specialty: Gastroenterology ?Contact information: ?[place] [address] ?[phone] ? ? ?  ?  ? ?  ?  ? ?  ? ?Discharge Exam: ?Filed Weights  ? 01/30/22 1221  ?Weight: 68 kg  ? ?Physical Exam ?HENT:  ?   Head: Normocephalic.  ?   Mouth/Throat:  ?   Pharynx: No oropharyngeal exudate.  ?Eyes:  ?   General: Lids are normal.  ?   Conjunctiva/sclera: Conjunctivae normal.  ?Cardiovascular:  ?   Rate and  Rhythm: Normal rate and regular rhythm.  ?   Heart sounds: Normal heart sounds, S1 normal and S2 normal.  ?Pulmonary:  ?   Breath sounds: Normal breath sounds. No decreased breath sounds, wheezing, rhonchi or rales.  ?Abdominal:  ?   Palpations: Abdomen is soft.  ?   Tenderness: There is no abdominal tenderness.  ?Musculoskeletal:  ?   Right lower leg: Swelling present.  ?   Left lower leg: Swelling present.  ?Skin: ?   General: Skin is warm.  ?   Findings: No rash.  ?Neurological:  ?   Mental Status: She is alert and oriented to person, place, and time.  ?  ? ?Condition at discharge: stable ? ?The results of significant diagnostics from this hospitalization (including imaging, microbiology, ancillary and laboratory) are listed below for reference.  ? ?Imaging Studies: ?MR ABDOMEN W WO CONTRAST ? ?Result Date: 02/02/2022 ?CLINICAL DATA:  Renal lesion seen on CT EXAM: MRI ABDOMEN WITHOUT AND WITH CONTRAST TECHNIQUE: Multiplanar multisequence MR imaging of the abdomen was performed both before and after the administration of intravenous contrast. CONTRAST:  9m GADAVIST GADOBUTROL 1 MMOL/ML IV SOLN COMPARISON:  CT abdomen and pelvis dated January 30, 2022 FINDINGS: Motion artifact somewhat limits evaluation. Lower chest: No acute findings. Hepatobiliary: T1 hypointense and T2 hyperintense lesion of segment V of the liver with possible areas of peripheral nodular enhancement, evaluation is limited due to motion artifact, although finding has been present on prior exams dating back to 2016 and is likely benign. No suspicious mass or other parenchymal abnormality identified. Pancreas: No mass, inflammatory changes, or other parenchymal abnormality identified. Spleen:  Within normal limits in size and appearance. Adrenals/Urinary Tract: Bilateral adrenal glands are unremarkable. Heterogeneous enhancing mass of the interpolar region of the right kidney measuring 2.3 x 2.0 cm on series 16, image 43. additional tiny T2  hyperintense lesions are seen in the bilateral kidneys, too small to completely characterize but likely simple cysts. Stomach/Bowel: Moderate hiatal hernia visualized bowel demonstrates mild diverticulosis. Vascula

## 2022-02-04 ENCOUNTER — Telehealth: Payer: Self-pay | Admitting: *Deleted

## 2022-02-04 ENCOUNTER — Telehealth: Payer: Medicare Other

## 2022-02-04 DIAGNOSIS — I872 Venous insufficiency (chronic) (peripheral): Secondary | ICD-10-CM | POA: Diagnosis not present

## 2022-02-04 DIAGNOSIS — M7550 Bursitis of unspecified shoulder: Secondary | ICD-10-CM | POA: Diagnosis not present

## 2022-02-04 DIAGNOSIS — I10 Essential (primary) hypertension: Secondary | ICD-10-CM | POA: Diagnosis not present

## 2022-02-04 DIAGNOSIS — G809 Cerebral palsy, unspecified: Secondary | ICD-10-CM | POA: Diagnosis not present

## 2022-02-04 DIAGNOSIS — R270 Ataxia, unspecified: Secondary | ICD-10-CM | POA: Diagnosis not present

## 2022-02-04 DIAGNOSIS — R42 Dizziness and giddiness: Secondary | ICD-10-CM | POA: Diagnosis not present

## 2022-02-04 DIAGNOSIS — F17210 Nicotine dependence, cigarettes, uncomplicated: Secondary | ICD-10-CM | POA: Diagnosis not present

## 2022-02-04 DIAGNOSIS — F32A Depression, unspecified: Secondary | ICD-10-CM | POA: Diagnosis not present

## 2022-02-04 DIAGNOSIS — E785 Hyperlipidemia, unspecified: Secondary | ICD-10-CM | POA: Diagnosis not present

## 2022-02-04 DIAGNOSIS — Z791 Long term (current) use of non-steroidal anti-inflammatories (NSAID): Secondary | ICD-10-CM | POA: Diagnosis not present

## 2022-02-04 DIAGNOSIS — N3001 Acute cystitis with hematuria: Secondary | ICD-10-CM | POA: Diagnosis not present

## 2022-02-04 DIAGNOSIS — J309 Allergic rhinitis, unspecified: Secondary | ICD-10-CM | POA: Diagnosis not present

## 2022-02-04 DIAGNOSIS — K5792 Diverticulitis of intestine, part unspecified, without perforation or abscess without bleeding: Secondary | ICD-10-CM | POA: Diagnosis not present

## 2022-02-04 DIAGNOSIS — A4151 Sepsis due to Escherichia coli [E. coli]: Secondary | ICD-10-CM | POA: Diagnosis not present

## 2022-02-04 DIAGNOSIS — R7301 Impaired fasting glucose: Secondary | ICD-10-CM | POA: Diagnosis not present

## 2022-02-04 DIAGNOSIS — Z9181 History of falling: Secondary | ICD-10-CM | POA: Diagnosis not present

## 2022-02-04 LAB — CULTURE, BLOOD (ROUTINE X 2)
Culture: NO GROWTH
Special Requests: ADEQUATE

## 2022-02-04 NOTE — Telephone Encounter (Signed)
Transition Care Management Follow-up Telephone Call ?Date of discharge and from where: [facility] 02-03-2022 ?How have you been since you were released from the hospital? NO PAIN FEELING A LITTLE BIT ?Any questions or concerns? No ? ?Items Reviewed: ?Did the pt receive and understand the discharge instructions provided? Yes  ?Medications obtained and verified? Yes  ?Other? No  ?Any new allergies since your discharge? No  ?Dietary orders reviewed? Yes ?Do you have support at home? Yes  ? ?Home Care and Equipment/Supplies: ?Were home health services ordered? not applicable ?If so, what is the name of the agency?   ?Has the agency set up a time to come to the patient's home? not applicable ?Were any new equipment or medical supplies ordered?  No ?What is the name of the medical supply agency?  ?Were you able to get the supplies/equipment? not applicable ?Do you have any questions related to the use of the equipment or supplies? No ? ?Functional Questionnaire: (I = Independent and D = Dependent) ?ADLs: D   ? ?Bathing/Dressing- HELP GETTING IN  ? ?Meal Prep- I ? ?Eating- I ? ?Maintaining continence- I ? ?Transferring/Ambulation- I/D ? ?Managing Meds- I ? ?Follow up appointments reviewed: ? ?PCP Hospital f/u appt confirmed? Yes  Scheduled to see 02-05-2022 on [place] @ 10:20 ?[facility] f/u appt confirmed? Yes  Scheduled to see [place] on 02-11-2022 @ 2:45. ?Are transportation arrangements needed? No  ?If their condition worsens, is the pt aware to call PCP or go to the Emergency Dept.? Yes ?Was the patient provided with contact information for the PCP's office or ED? Yes ?Was to pt encouraged to call back with questions or concerns? Yes  ?

## 2022-02-05 ENCOUNTER — Encounter: Payer: Self-pay | Admitting: Internal Medicine

## 2022-02-05 ENCOUNTER — Other Ambulatory Visit: Payer: Self-pay

## 2022-02-05 ENCOUNTER — Ambulatory Visit (INDEPENDENT_AMBULATORY_CARE_PROVIDER_SITE_OTHER): Payer: Medicare Other | Admitting: Internal Medicine

## 2022-02-05 VITALS — BP 114/66 | HR 69 | Temp 98.8°F | Ht 59.84 in | Wt 150.0 lb

## 2022-02-05 DIAGNOSIS — K5792 Diverticulitis of intestine, part unspecified, without perforation or abscess without bleeding: Secondary | ICD-10-CM

## 2022-02-05 DIAGNOSIS — R609 Edema, unspecified: Secondary | ICD-10-CM | POA: Diagnosis not present

## 2022-02-05 DIAGNOSIS — G809 Cerebral palsy, unspecified: Secondary | ICD-10-CM | POA: Diagnosis not present

## 2022-02-05 LAB — URINALYSIS, ROUTINE W REFLEX MICROSCOPIC
Bilirubin, UA: NEGATIVE
Glucose, UA: NEGATIVE
Ketones, UA: NEGATIVE
Nitrite, UA: NEGATIVE
Protein,UA: NEGATIVE
Specific Gravity, UA: 1.01 (ref 1.005–1.030)
Urobilinogen, Ur: 0.2 mg/dL (ref 0.2–1.0)
pH, UA: 6 (ref 5.0–7.5)

## 2022-02-05 LAB — CBC WITH DIFFERENTIAL/PLATELET
Hematocrit: 38.8 % (ref 34.0–46.6)
Hemoglobin: 13.4 g/dL (ref 11.1–15.9)
Lymphocytes Absolute: 1.7 10*3/uL (ref 0.7–3.1)
Lymphs: 19 %
MCH: 32.6 pg (ref 26.6–33.0)
MCHC: 34.5 g/dL (ref 31.5–35.7)
MCV: 94 fL (ref 79–97)
MID (Absolute): 0.4 10*3/uL (ref 0.1–1.6)
MID: 5 %
Neutrophils Absolute: 7 10*3/uL (ref 1.4–7.0)
Neutrophils: 76 %
Platelets: 403 10*3/uL (ref 150–450)
RBC: 4.11 x10E6/uL (ref 3.77–5.28)
RDW: 13.2 % (ref 11.7–15.4)
WBC: 9.1 10*3/uL (ref 3.4–10.8)

## 2022-02-05 LAB — MICROSCOPIC EXAMINATION

## 2022-02-05 NOTE — Progress Notes (Signed)
? ?BP 114/66   Pulse 69   Temp 98.8 ?F (37.1 ?C) (Oral)   Ht 4' 11.84" (1.52 m)   Wt 150 lb (68 kg)   SpO2 96%   BMI 29.45 kg/m?   ? ?Subjective:  ? ? Patient ID: Michelle Chandler, female    DOB: 05/06/1956, 66 y.o.   MRN: [id number] ? ?No chief complaint on file. ? ? ?HPI: ?Michelle Chandler is a 66 y.o. female ? ?Pt is here for a hospital follow up was d/c Tuesday afternoon. Was diagnosed with cystis / diverticulitis and new diagnosis of renal cell carcinoma.  ?Admit date:     01/30/2022 ?Discharge date: 02/03/22 ? ? ? ? ?No chief complaint on file. ? ? ?Relevant past medical, surgical, family and social history reviewed and updated as indicated. Interim medical history since our last visit reviewed. ?Allergies and medications reviewed and updated. ? ?Review of Systems  ?Constitutional:  Negative for activity change, appetite change, chills, fatigue and fever.  ?HENT:  Negative for congestion, ear discharge, ear pain and facial swelling.   ?Eyes:  Negative for pain and itching.  ?Respiratory:  Negative for cough, chest tightness, shortness of breath and wheezing.   ?Cardiovascular:  Negative for chest pain, palpitations and leg swelling.  ?Gastrointestinal:  Negative for abdominal distention, abdominal pain, blood in stool, constipation, diarrhea, nausea and vomiting.  ?Endocrine: Negative for cold intolerance, heat intolerance, polydipsia, polyphagia and polyuria.  ?Genitourinary:  Negative for difficulty urinating, dysuria, flank pain, frequency, hematuria and urgency.  ?Musculoskeletal:  Negative for arthralgias, gait problem, joint swelling and myalgias.  ?Skin:  Negative for color change, rash and wound.  ?Neurological:  Negative for dizziness, tremors, speech difficulty, weakness, light-headedness, numbness and headaches.  ?Hematological:  Does not bruise/bleed easily.  ?Psychiatric/Behavioral:  Negative for agitation, confusion, decreased concentration, sleep disturbance and suicidal ideas.   ? ?Per HPI unless  specifically indicated above ? ?   ?Objective:  ?  ?BP 114/66   Pulse 69   Temp 98.8 ?F (37.1 ?C) (Oral)   Ht 4' 11.84" (1.52 m)   Wt 150 lb (68 kg)   SpO2 96%   BMI 29.45 kg/m?   ?Wt Readings from Last 3 Encounters:  ?02/05/22 150 lb (68 kg)  ?01/30/22 150 lb (68 kg)  ?12/24/21 143 lb (64.9 kg)  ?  ?Physical Exam ?Vitals and nursing note reviewed.  ?Constitutional:   ?   General: She is not in acute distress. ?   Appearance: Normal appearance. She is not ill-appearing or diaphoretic.  ?Eyes:  ?   Conjunctiva/sclera: Conjunctivae normal.  ?Cardiovascular:  ?   Rate and Rhythm: Normal rate and regular rhythm.  ?   Heart sounds: No murmur heard. ?  No friction rub.  ?Pulmonary:  ?   Effort: No respiratory distress.  ?   Breath sounds: No rhonchi.  ?Abdominal:  ?   General: Abdomen is flat. Bowel sounds are normal. There is no distension.  ?   Palpations: Abdomen is soft. There is no mass.  ?   Tenderness: There is no abdominal tenderness. There is no guarding.  ?Skin: ?   General: Skin is warm and dry.  ?   Coloration: Skin is not jaundiced.  ?   Findings: No erythema.  ?Neurological:  ?   Mental Status: She is alert.  ?Psychiatric:     ?   Mood and Affect: Mood normal.     ?   Behavior: Behavior normal.  ? ? ? ? ?Current  Outpatient Medications:  ??  atorvastatin (LIPITOR) 40 MG tablet, Take 1 tablet (40 mg total) by mouth daily., Disp: 90 tablet, Rfl: 0 ??  baclofen (LIORESAL) 10 MG tablet, Take 1 tablet (10 mg total) by mouth every evening., Disp: 30 tablet, Rfl: 0 ??  diphenhydrAMINE (BENADRYL) 25 MG tablet, Take 25 mg by mouth every 6 (six) hours as needed for itching or allergies., Disp: , Rfl:  ??  famotidine (PEPCID) 20 MG tablet, Take 1 tablet (20 mg total) by mouth 2 (two) times daily., Disp: 30 tablet, Rfl: 4 ??  FLUoxetine (PROZAC) 20 MG capsule, Take 1 capsule (20 mg total) by mouth daily., Disp: 90 capsule, Rfl: 0 ??  ibandronate (BONIVA) 150 MG tablet, Take 1 tablet (150 mg total) by mouth every 30  (thirty) days. Take in the morning with a full glass of water, on an empty stomach, and do not take anything else by mouth or lie down for the next 30 min., Disp: 12 tablet, Rfl: 1 ??  Incontinence Supply Disposable (ENTRUST PLUS DISP UNDERPADS) MISC, by Does not apply route., Disp: , Rfl:  ??  Incontinence Supply Disposable (PROTECTIVE UNDERWEAR LARGE) MISC, by Does not apply route., Disp: , Rfl:  ??  loperamide (IMODIUM) 2 MG capsule, Take 1 capsule (2 mg total) by mouth every 8 (eight) hours as needed for diarrhea or loose stools., Disp: 10 capsule, Rfl: 0 ??  metroNIDAZOLE (FLAGYL) 500 MG tablet, Take 1 tablet (500 mg total) by mouth 2 (two) times daily for 6 days., Disp: 12 tablet, Rfl: 0 ??  pantoprazole (PROTONIX) 40 MG tablet, Take 1 tablet (40 mg total) by mouth daily., Disp: 30 tablet, Rfl: 0 ??  sucralfate (CARAFATE) 1 GM/10ML suspension, Take 10 mLs (1 g total) by mouth 4 (four) times daily -  with meals and at bedtime., Disp: 1242 mL, Rfl: 0 ??  sulfamethoxazole-trimethoprim (BACTRIM DS) 800-160 MG tablet, Take 1 tablet by mouth 2 (two) times daily for 6 days., Disp: 12 tablet, Rfl: 0  ? ? ?MRI ABDOMEN WITHOUT AND WITH CONTRAST : 2023  ?  ?Heterogeneous enhancing mass the interpolar region of the right ?kidney, compatible with renal cell carcinoma. ? ?Assessment & Plan:  ?Sepsis due to Escherichia coli  ?Stable resolved. ? ?Diverticulitis  ?Stable, has an OP appt with GI for a follow up.  ? ?Renal cell carcinoma.  ?To fu with urology as outpatient ? ?Electrolyte imbalances will check CMP today.  ?Asymptomatic  ?Continue to monitor for such  ?Problem List Items Addressed This Visit   ?None ?  ? ?No orders of the defined types were placed in this encounter. ?  ? ?No orders of the defined types were placed in this encounter. ?  ? ?Follow up plan: ?No follow-ups on file. ? ? ?

## 2022-02-06 ENCOUNTER — Telehealth: Payer: Self-pay | Admitting: Internal Medicine

## 2022-02-06 DIAGNOSIS — K5792 Diverticulitis of intestine, part unspecified, without perforation or abscess without bleeding: Secondary | ICD-10-CM | POA: Diagnosis not present

## 2022-02-06 DIAGNOSIS — E785 Hyperlipidemia, unspecified: Secondary | ICD-10-CM | POA: Diagnosis not present

## 2022-02-06 DIAGNOSIS — A4151 Sepsis due to Escherichia coli [E. coli]: Secondary | ICD-10-CM | POA: Diagnosis not present

## 2022-02-06 DIAGNOSIS — I10 Essential (primary) hypertension: Secondary | ICD-10-CM | POA: Diagnosis not present

## 2022-02-06 DIAGNOSIS — G809 Cerebral palsy, unspecified: Secondary | ICD-10-CM | POA: Diagnosis not present

## 2022-02-06 DIAGNOSIS — N3001 Acute cystitis with hematuria: Secondary | ICD-10-CM | POA: Diagnosis not present

## 2022-02-06 LAB — COMPREHENSIVE METABOLIC PANEL
ALT: 27 IU/L (ref 0–32)
AST: 33 IU/L (ref 0–40)
Albumin/Globulin Ratio: 1.5 (ref 1.2–2.2)
Albumin: 3.7 g/dL — ABNORMAL LOW (ref 3.8–4.8)
Alkaline Phosphatase: 98 IU/L (ref 44–121)
BUN/Creatinine Ratio: 7 — ABNORMAL LOW (ref 12–28)
BUN: 4 mg/dL — ABNORMAL LOW (ref 8–27)
Bilirubin Total: 0.2 mg/dL (ref 0.0–1.2)
CO2: 25 mmol/L (ref 20–29)
Calcium: 9.3 mg/dL (ref 8.7–10.3)
Chloride: 104 mmol/L (ref 96–106)
Creatinine, Ser: 0.6 mg/dL (ref 0.57–1.00)
Globulin, Total: 2.5 g/dL (ref 1.5–4.5)
Glucose: 92 mg/dL (ref 70–99)
Potassium: 3.6 mmol/L (ref 3.5–5.2)
Sodium: 144 mmol/L (ref 134–144)
Total Protein: 6.2 g/dL (ref 6.0–8.5)
eGFR: 101 mL/min/{1.73_m2} (ref >59)

## 2022-02-06 NOTE — Telephone Encounter (Signed)
Cyndi with [facility] is calling for verbal order [facility] PT ?Frequency - 2 w 5, 1 w 4 ? ?CB- [phone] Ok to leave verbal on VM ?

## 2022-02-09 DIAGNOSIS — N3001 Acute cystitis with hematuria: Secondary | ICD-10-CM | POA: Diagnosis not present

## 2022-02-09 DIAGNOSIS — A4151 Sepsis due to Escherichia coli [E. coli]: Secondary | ICD-10-CM | POA: Diagnosis not present

## 2022-02-09 DIAGNOSIS — E785 Hyperlipidemia, unspecified: Secondary | ICD-10-CM | POA: Diagnosis not present

## 2022-02-09 DIAGNOSIS — K5792 Diverticulitis of intestine, part unspecified, without perforation or abscess without bleeding: Secondary | ICD-10-CM | POA: Diagnosis not present

## 2022-02-09 DIAGNOSIS — I10 Essential (primary) hypertension: Secondary | ICD-10-CM | POA: Diagnosis not present

## 2022-02-09 DIAGNOSIS — G809 Cerebral palsy, unspecified: Secondary | ICD-10-CM | POA: Diagnosis not present

## 2022-02-09 NOTE — Telephone Encounter (Signed)
Called and LVM giving verbal orders per Dr. Neomia Dear.  ?

## 2022-02-09 NOTE — Telephone Encounter (Signed)
Routing to provider for verbal orders 

## 2022-02-09 NOTE — Telephone Encounter (Signed)
Ok for verbal orders as requested 

## 2022-02-11 ENCOUNTER — Ambulatory Visit (INDEPENDENT_AMBULATORY_CARE_PROVIDER_SITE_OTHER): Payer: Medicare Other | Admitting: Urology

## 2022-02-11 ENCOUNTER — Encounter: Payer: Self-pay | Admitting: Urology

## 2022-02-11 VITALS — BP 150/78 | HR 78 | Ht 60.0 in | Wt 150.0 lb

## 2022-02-11 DIAGNOSIS — N2889 Other specified disorders of kidney and ureter: Secondary | ICD-10-CM | POA: Diagnosis not present

## 2022-02-12 ENCOUNTER — Encounter: Payer: Self-pay | Admitting: Urology

## 2022-02-12 DIAGNOSIS — G809 Cerebral palsy, unspecified: Secondary | ICD-10-CM | POA: Diagnosis not present

## 2022-02-12 DIAGNOSIS — A4151 Sepsis due to Escherichia coli [E. coli]: Secondary | ICD-10-CM | POA: Diagnosis not present

## 2022-02-12 DIAGNOSIS — K5792 Diverticulitis of intestine, part unspecified, without perforation or abscess without bleeding: Secondary | ICD-10-CM | POA: Diagnosis not present

## 2022-02-12 DIAGNOSIS — E785 Hyperlipidemia, unspecified: Secondary | ICD-10-CM | POA: Diagnosis not present

## 2022-02-12 DIAGNOSIS — I10 Essential (primary) hypertension: Secondary | ICD-10-CM | POA: Diagnosis not present

## 2022-02-12 DIAGNOSIS — N3001 Acute cystitis with hematuria: Secondary | ICD-10-CM | POA: Diagnosis not present

## 2022-02-12 LAB — MICROSCOPIC EXAMINATION: Bacteria, UA: NONE SEEN

## 2022-02-12 LAB — URINALYSIS, COMPLETE
Bilirubin, UA: NEGATIVE
Glucose, UA: NEGATIVE
Ketones, UA: NEGATIVE
Nitrite, UA: NEGATIVE
Protein,UA: NEGATIVE
Specific Gravity, UA: 1.01 (ref 1.005–1.030)
Urobilinogen, Ur: 0.2 mg/dL (ref 0.2–1.0)
pH, UA: 7 (ref 5.0–7.5)

## 2022-02-12 NOTE — Progress Notes (Signed)
? ?02/11/2022 ?9:24 AM  ? ?[name] ?February 27, 1956 ?[id number] ? ?Referring provider: Charlynne Cousins, MD ?[address] [place],  [place] [address] ? ?Chief Complaint  ?Patient presents with  ? Other  ? ? ?HPI: ?Michelle Chandler is a 66 y.o. female who presents for evaluation of a right renal mass.  ? ?Hospitalized [facility] 01/30/2022 with E. coli sepsis.  Urine and blood cultures were positive for E. coli and she was also treated for diverticulitis ?CT abdomen pelvis showed a 2.3 x 2 cm enhancing right renal mass ?Renal mass protocol MRI was performed during her hospitalization which showed a 2.3 x 2 cm enhancing right renal mass ?Denies gross hematuria ?No flank, abdominal or pelvic pain ?No bothersome LUTS ? ? ?PMH: ?Past Medical History:  ?Diagnosis Date  ? Advance care planning 06/28/2019  ? Allergic rhinitis   ? Ataxia   ? Bursitis of shoulder 11/20/2015  ? Overview:  Last Assessment & Plan:  Discussed care and treatment shoulder bursitis with meloxicam  ? CP (cerebral palsy) ([place])   ? Depression   ? Diverticulitis   ? Diverticulosis   ? Fatigue 02/05/2020  ? Hyperlipidemia   ? Hypertension   ? IFG (impaired fasting glucose)   ? Venous insufficiency   ? Venous stasis ulcer ([place])   ? Vertigo   ? ? ?Surgical History: ?Past Surgical History:  ?Procedure Laterality Date  ? ABDOMINAL HYSTERECTOMY    ? complete  ? LAPAROSCOPY    ? legs and hip    ? due to CP  ? ? ?Home Medications:  ?Allergies as of 02/11/2022   ?No Known Allergies ?  ? ?  ?Medication List  ?  ? ?  ? Accurate as of February 11, 2022 11:59 PM. If you have any questions, ask your nurse or doctor.  ?  ?  ? ?  ? ?atorvastatin 40 MG tablet ?Commonly known as: LIPITOR ?Take 1 tablet (40 mg total) by mouth daily. ?  ?baclofen 10 MG tablet ?Commonly known as: LIORESAL ?Take 1 tablet (10 mg total) by mouth every evening. ?  ?diphenhydrAMINE 25 MG tablet ?Commonly known as: BENADRYL ?Take 25 mg by mouth every 6 (six) hours as needed for itching or allergies. ?  ?famotidine 20 MG  tablet ?Commonly known as: PEPCID ?Take 1 tablet (20 mg total) by mouth 2 (two) times daily. ?  ?FLUoxetine 20 MG capsule ?Commonly known as: PROZAC ?Take 1 capsule (20 mg total) by mouth daily. ?  ?ibandronate 150 MG tablet ?Commonly known as: Boniva ?Take 1 tablet (150 mg total) by mouth every 30 (thirty) days. Take in the morning with a full glass of water, on an empty stomach, and do not take anything else by mouth or lie down for the next 30 min. ?  ?loperamide 2 MG capsule ?Commonly known as: IMODIUM ?Take 1 capsule (2 mg total) by mouth every 8 (eight) hours as needed for diarrhea or loose stools. ?  ?pantoprazole 40 MG tablet ?Commonly known as: Protonix ?Take 1 tablet (40 mg total) by mouth daily. ?  ?Protective Underwear Large Misc ?by Does not apply route. ?  ?Entrust Plus Disp Underpads Misc ?by Does not apply route. ?  ?sucralfate 1 GM/10ML suspension ?Commonly known as: CARAFATE ?Take 10 mLs (1 g total) by mouth 4 (four) times daily -  with meals and at bedtime. ?  ? ?  ? ? ?Allergies: No Known Allergies ? ?Family History: ?Family History  ?Problem Relation Age of Onset  ?  Diabetes Mother   ? Diabetes Father   ? Heart disease Maternal Uncle 46  ? Kidney disease Maternal Uncle   ? ? ?Social History:  reports that she has been smoking cigarettes. She has been smoking an average of .5 packs per day. She has never used smokeless tobacco. She reports that she does not drink alcohol and does not use drugs. ? ? ?Physical Exam: ?BP (!) 150/78   Pulse 78   Ht 5' (1.524 m)   Wt 150 lb (68 kg)   BMI 29.29 kg/m?   ?Constitutional:  Alert and oriented, No acute distress. ?HEENT: [place] AT, moist mucus membranes.  Trachea midline, no masses. ?Respiratory: Normal respiratory effort, no increased work of breathing. ?Psychiatric: Normal mood and affect. ? ? ?Pertinent Imaging: ?CT images were personally reviewed and interpreted ? ?Assessment & Plan:   ? ?1.  Right renal mass ?2 cm solid right renal mass.  Discussed  there is a ~ 75% probability this is a small renal cell carcinoma ?A solid renal mass raises the suspicion of primary renal malignancy.  We discussed this in detail and in regards to the spectrum of renal masses which includes cysts (pure cysts are considered benign), solid masses and everything in between. The risk of metastasis increases as the size of solid renal mass increases. In general, it is believed that the risk of metastasis for renal masses less than 3-4 cm is small (up to approximately 5%) based mainly on large retrospective studies. ?In some cases and especially in patients of older age and multiple comorbidities a surveillance approach may be appropriate. The treatment of solid renal masses includes: surveillance, cryoablation (percutaneous and laparoscopic) in addition to partial and complete nephrectomy (each with option of laparoscopic, robotic and open depending on appropriateness). Furthermore, nephrectomy appears to be an independent risk factor for the development of chronic kidney disease suggesting that nephron sparing approaches should be implored whenever feasible. We reviewed these options in context of the patients current situation as well as the pros and cons of each. ?She has initially elected surveillance and will schedule a follow-up renal mass protocol MRI in 6 months ? ? ?Abbie Sons, MD ? ?[facility] ?[address], [address] ?[place], [place] [address] ?[phone] ? ?

## 2022-02-13 DIAGNOSIS — E785 Hyperlipidemia, unspecified: Secondary | ICD-10-CM | POA: Diagnosis not present

## 2022-02-13 DIAGNOSIS — G809 Cerebral palsy, unspecified: Secondary | ICD-10-CM | POA: Diagnosis not present

## 2022-02-13 DIAGNOSIS — A4151 Sepsis due to Escherichia coli [E. coli]: Secondary | ICD-10-CM | POA: Diagnosis not present

## 2022-02-13 DIAGNOSIS — N3001 Acute cystitis with hematuria: Secondary | ICD-10-CM | POA: Diagnosis not present

## 2022-02-13 DIAGNOSIS — I10 Essential (primary) hypertension: Secondary | ICD-10-CM | POA: Diagnosis not present

## 2022-02-13 DIAGNOSIS — K5792 Diverticulitis of intestine, part unspecified, without perforation or abscess without bleeding: Secondary | ICD-10-CM | POA: Diagnosis not present

## 2022-02-16 ENCOUNTER — Telehealth: Payer: Self-pay | Admitting: Internal Medicine

## 2022-02-16 NOTE — Telephone Encounter (Signed)
Routing to provider for verbal orders 

## 2022-02-16 NOTE — Telephone Encounter (Signed)
OK for verbal orders?

## 2022-02-16 NOTE — Telephone Encounter (Signed)
Copied from [place] [id number]. Topic: Quick Communication - Home Health Verbal Orders ?>> Feb 16, 2022  8:36 AM Tessa Lerner A wrote: ?Caller/Agency: Sherlynn Stalls / [facility]  ?Callback Number: [phone] ?Requesting OT/PT/Skilled Nursing/Social Work/Speech Therapy: OT ?Frequency: 1w2 ?

## 2022-02-16 NOTE — Telephone Encounter (Signed)
Called and gave verbal orders per Dr. Johnson.  °

## 2022-02-17 DIAGNOSIS — A4151 Sepsis due to Escherichia coli [E. coli]: Secondary | ICD-10-CM | POA: Diagnosis not present

## 2022-02-17 DIAGNOSIS — N3001 Acute cystitis with hematuria: Secondary | ICD-10-CM | POA: Diagnosis not present

## 2022-02-17 DIAGNOSIS — I10 Essential (primary) hypertension: Secondary | ICD-10-CM | POA: Diagnosis not present

## 2022-02-17 DIAGNOSIS — E785 Hyperlipidemia, unspecified: Secondary | ICD-10-CM | POA: Diagnosis not present

## 2022-02-17 DIAGNOSIS — K5792 Diverticulitis of intestine, part unspecified, without perforation or abscess without bleeding: Secondary | ICD-10-CM | POA: Diagnosis not present

## 2022-02-17 DIAGNOSIS — G809 Cerebral palsy, unspecified: Secondary | ICD-10-CM | POA: Diagnosis not present

## 2022-02-19 DIAGNOSIS — N3001 Acute cystitis with hematuria: Secondary | ICD-10-CM | POA: Diagnosis not present

## 2022-02-19 DIAGNOSIS — A4151 Sepsis due to Escherichia coli [E. coli]: Secondary | ICD-10-CM | POA: Diagnosis not present

## 2022-02-19 DIAGNOSIS — K5792 Diverticulitis of intestine, part unspecified, without perforation or abscess without bleeding: Secondary | ICD-10-CM | POA: Diagnosis not present

## 2022-02-19 DIAGNOSIS — I10 Essential (primary) hypertension: Secondary | ICD-10-CM | POA: Diagnosis not present

## 2022-02-19 DIAGNOSIS — E785 Hyperlipidemia, unspecified: Secondary | ICD-10-CM | POA: Diagnosis not present

## 2022-02-19 DIAGNOSIS — G809 Cerebral palsy, unspecified: Secondary | ICD-10-CM | POA: Diagnosis not present

## 2022-02-25 ENCOUNTER — Ambulatory Visit (INDEPENDENT_AMBULATORY_CARE_PROVIDER_SITE_OTHER): Payer: Medicare Other

## 2022-02-25 ENCOUNTER — Telehealth: Payer: Self-pay

## 2022-02-25 ENCOUNTER — Telehealth: Payer: Medicare Other

## 2022-02-25 DIAGNOSIS — N2889 Other specified disorders of kidney and ureter: Secondary | ICD-10-CM

## 2022-02-25 DIAGNOSIS — I10 Essential (primary) hypertension: Secondary | ICD-10-CM

## 2022-02-25 DIAGNOSIS — E7800 Pure hypercholesterolemia, unspecified: Secondary | ICD-10-CM

## 2022-02-25 DIAGNOSIS — F419 Anxiety disorder, unspecified: Secondary | ICD-10-CM

## 2022-02-25 DIAGNOSIS — G809 Cerebral palsy, unspecified: Secondary | ICD-10-CM

## 2022-02-25 DIAGNOSIS — F339 Major depressive disorder, recurrent, unspecified: Secondary | ICD-10-CM

## 2022-02-25 DIAGNOSIS — G801 Spastic diplegic cerebral palsy: Secondary | ICD-10-CM

## 2022-02-25 NOTE — Chronic Care Management (AMB) (Signed)
?Chronic Care Management  ? ?CCM RN Visit Note ? ?02/25/2022 ?Name: Michelle Chandler MRN: [id number] DOB: 09-Jul-1956 ? ?Subjective: ?Michelle Chandler is a 66 y.o. year old female who is a primary care patient of Vigg, Avanti, MD. The care management team was consulted for assistance with disease management and care coordination needs.   ? ?Engaged with patient by telephone for follow up visit in response to provider referral for case management and/or care coordination services.  ? ?Consent to Services:  ?The patient was given information about Chronic Care Management services, agreed to services, and gave verbal consent prior to initiation of services.  Please see initial visit note for detailed documentation.  ? ?Patient agreed to services and verbal consent obtained.  ? ?Assessment: Review of patient past medical history, allergies, medications, health status, including review of consultants reports, laboratory and other test data, was performed as part of comprehensive evaluation and provision of chronic care management services.  ? ?SDOH (Social Determinants of Health) assessments and interventions performed:   ? ?CCM Care Plan ? ?No Known Allergies ? ?Outpatient Encounter Medications as of 02/25/2022  ?Medication Sig Note  ? atorvastatin (LIPITOR) 40 MG tablet Take 1 tablet (40 mg total) by mouth daily.   ? baclofen (LIORESAL) 10 MG tablet Take 1 tablet (10 mg total) by mouth every evening.   ? diphenhydrAMINE (BENADRYL) 25 MG tablet Take 25 mg by mouth every 6 (six) hours as needed for itching or allergies.   ? famotidine (PEPCID) 20 MG tablet Take 1 tablet (20 mg total) by mouth 2 (two) times daily.   ? FLUoxetine (PROZAC) 20 MG capsule Take 1 capsule (20 mg total) by mouth daily.   ? ibandronate (BONIVA) 150 MG tablet Take 1 tablet (150 mg total) by mouth every 30 (thirty) days. Take in the morning with a full glass of water, on an empty stomach, and do not take anything else by mouth or lie down for the next 30 min.  01/30/2022: Has not started  ? Incontinence Supply Disposable (ENTRUST PLUS DISP UNDERPADS) MISC by Does not apply route.   ? Incontinence Supply Disposable (PROTECTIVE UNDERWEAR LARGE) MISC by Does not apply route.   ? loperamide (IMODIUM) 2 MG capsule Take 1 capsule (2 mg total) by mouth every 8 (eight) hours as needed for diarrhea or loose stools.   ? pantoprazole (PROTONIX) 40 MG tablet Take 1 tablet (40 mg total) by mouth daily.   ? sucralfate (CARAFATE) 1 GM/10ML suspension Take 10 mLs (1 g total) by mouth 4 (four) times daily -  with meals and at bedtime.   ? ?No facility-administered encounter medications on file as of 02/25/2022.  ? ? ?Patient Active Problem List  ? Diagnosis Date Noted  ? Swelling 02/05/2022  ? Hypomagnesemia 02/01/2022  ? Hypokalemia 01/31/2022  ? Hyponatremia 01/31/2022  ? Acute respiratory failure with hypoxia ([place]) 01/31/2022  ? Diverticulitis 01/31/2022  ? Sepsis due to Escherichia coli (E. coli) ([place]) 01/31/2022  ? Acute cystitis with hematuria 01/31/2022  ? Renal mass 01/31/2022  ? E. coli sepsis ([place]) 01/31/2022  ? Abdominal pain 01/30/2022  ? Need for influenza vaccination 09/16/2021  ? Anxiety 06/12/2021  ? Osteoporosis 06/12/2021  ? Prediabetes 10/07/2020  ? Gastroesophageal reflux disease 06/10/2020  ? Smoking greater than 40 pack years 06/10/2020  ? Cerebral palsy ([place]) 12/27/2015  ? Hyperlipidemia 12/27/2015  ? Essential (primary) hypertension 12/27/2015  ? Depression, recurrent ([place]) 11/20/2015  ? ? ?Conditions to be addressed/monitored:HTN, HLD, Anxiety, Depression, and CP  and renal mass ? ?Care Plan : RNCM: General Plan of Care (Adult) for Chronic Disease Management and Care Coordination Needs  ?Updates made by Vanita Ingles, RN since 02/25/2022 12:00 AM  ?  ? ?Problem: RNCM: Development of plan of care for Chronic Disease Management (HTN, HLD, Depression, Anxiety, CP)   ?Priority: High  ?  ? ?Long-Range Goal: RNCM: Effective Management  of plan of care for Chronic Disease  Management (HTN, HLD, Depression, Anxiety, CP)   ?Start Date: 09/17/2021  ?Expected End Date: 09/17/2022  ?Priority: High  ?Note:   ?                                                                                                                                                                                                                   Current Barriers:  ?Knowledge Deficits related to plan of care for management of HTN, HLD, Anxiety with Excessive Worry, ?Social Anxiety, and Depression: depressed mood ?anxiety, and CP  ?Chronic Disease Management support and education needs related to HTN, HLD, Anxiety with Excessive Worry, ?Social Anxiety, and Depression: depressed mood ?anxiety, and Cerebral Palsy (CP) ? ?RNCM Clinical Goal(s):  ?Patient will verbalize understanding of plan for management of HTN, HLD, Anxiety, Depression, and CP as evidenced by compliance with the plan of care, taking medications as directed, working with the CCM team to optimize health and well being  ?take all medications exactly as prescribed and will call provider for medication related questions as evidenced by compliance with medications, calling for refills before running out of medications, calling the office for changes in condition    ?demonstrate improved and ongoing adherence to prescribed treatment plan for HTN, HLD, Anxiety, Depression, and CP as evidenced by keeping appointments and calling the office for changes in conditions or changes  ?demonstrate a decrease in HTN, HLD, Anxiety, Depression, and CP exacerbations  as evidenced by compliance with the plan of care, compliance with medications, compliance with dietary restrictions ?demonstrate ongoing self health care management ability for effective management of Chronic conditions  as evidenced by  working with the CCM team   through collaboration with Consulting civil engineer, provider, and care team.  ? ?Interventions: ?1:1 collaboration with primary care provider regarding  development and update of comprehensive plan of care as evidenced by provider attestation and co-signature ?Inter-disciplinary care team collaboration (see longitudinal plan of care) ?Evaluation of current treatment plan related to  self management and patient's adherence to plan as established by provider ? ? ?SDOH Barriers (  Status: Goal on Track (progressing): YES.) Long Term Goal  ?Patient interviewed and SDOH assessment performed ?       ?SDOH Interventions   ? ?Flowsheet Row Most Recent Value  ?SDOH Interventions   ?Food Insecurity Interventions Intervention Not Indicated  ?Financial Strain Interventions Intervention Not Indicated  ?Housing Interventions Intervention Not Indicated  ?Intimate Partner Violence Interventions Intervention Not Indicated  ?Physical Activity Interventions Other (Comments)  [limited ability to do activity due to chronic conditions and CP]  ?Social Connections Interventions Intervention Not Indicated, Other (Comment)  [has good support system]  ?Transportation Interventions Intervention Not Indicated  ? ?  ?Patient interviewed and appropriate assessments performed ?Provided patient with information about resources for [facility] and care guides available to assist with new concerns or needs  ?Discussed plans with patient for ongoing care management follow up and provided patient with direct contact information for care management team ?Advised patient to call the office for changes in SDOH, changes of concerns ? ? ?Cerebral Palsy (CP)  (Status: Goal on Track (progressing): YES.) Long Term Goal  ?Evaluation of current treatment plan related to  CP ,  self-management and patient's adherence to plan as established by provider. 11-26-2021: The patient has an aide that comes in 5 days a week to assist with bathing and general ADLS/IADLS. She feels like her CP is stable and denies any new concerns related to CP. 02-25-2022: Her CP is stable. No acute changes noted. Still has her aide come  in 5 days a week. Has a good support system.  ?Discussed plans with patient for ongoing care management follow up and provided patient with direct contact information for care management team ?Advised patient to

## 2022-02-25 NOTE — Telephone Encounter (Signed)
?  Care Management  ? ?Follow Up Note ? ? ?02/25/2022 ?Name: Rona Tomson MRN: [id number] DOB: 01/16/1956 ? ? ?Referred by: Charlynne Cousins, MD ?Reason for referral : Chronic Care Management (RNCM: Follow up for Chronic Disease Management and Care Coordination Needs) ? ? ?Call made back to the patient and call completed. See new encounter.  ? ?Follow Up Plan: Telephone follow up appointment with care management team member scheduled for: 04-29-2022 at 1 pm ? ?Noreene Larsson RN, MSN, CCM ?Community Care Coordinator ?[facility] ?[place] ?Mobile: [phone]  ?

## 2022-02-25 NOTE — Patient Instructions (Signed)
Visit Information ? ?Thank you for taking time to visit with me today. Please don't hesitate to contact me if I can be of assistance to you before our next scheduled telephone appointment. ? ?Following are the goals we discussed today:  ?RNCM Clinical Goal(s):  ?Patient will verbalize understanding of plan for management of HTN, HLD, Anxiety, Depression, and CP as evidenced by compliance with the plan of care, taking medications as directed, working with the CCM team to optimize health and well being  ?take all medications exactly as prescribed and will call provider for medication related questions as evidenced by compliance with medications, calling for refills before running out of medications, calling the office for changes in condition    ?demonstrate improved and ongoing adherence to prescribed treatment plan for HTN, HLD, Anxiety, Depression, and CP as evidenced by keeping appointments and calling the office for changes in conditions or changes  ?demonstrate a decrease in HTN, HLD, Anxiety, Depression, and CP exacerbations  as evidenced by compliance with the plan of care, compliance with medications, compliance with dietary restrictions ?demonstrate ongoing self health care management ability for effective management of Chronic conditions  as evidenced by  working with the CCM team   through collaboration with Consulting civil engineer, provider, and care team.  ?  ?Interventions: ?1:1 collaboration with primary care provider regarding development and update of comprehensive plan of care as evidenced by provider attestation and co-signature ?Inter-disciplinary care team collaboration (see longitudinal plan of care) ?Evaluation of current treatment plan related to  self management and patient's adherence to plan as established by provider ?  ?  ?SDOH Barriers (Status: Goal on Track (progressing): YES.) Long Term Goal  ?Patient interviewed and SDOH assessment performed ?       ?SDOH Interventions   ?  ?Flowsheet Row  Most Recent Value  ?SDOH Interventions    ?Food Insecurity Interventions Intervention Not Indicated  ?Financial Strain Interventions Intervention Not Indicated  ?Housing Interventions Intervention Not Indicated  ?Intimate Partner Violence Interventions Intervention Not Indicated  ?Physical Activity Interventions Other (Comments)  [limited ability to do activity due to chronic conditions and CP]  ?Social Connections Interventions Intervention Not Indicated, Other (Comment)  [has good support system]  ?Transportation Interventions Intervention Not Indicated  ?  ?   ?Patient interviewed and appropriate assessments performed ?Provided patient with information about resources for [facility] and care guides available to assist with new concerns or needs  ?Discussed plans with patient for ongoing care management follow up and provided patient with direct contact information for care management team ?Advised patient to call the office for changes in SDOH, changes of concerns ?  ?  ?Cerebral Palsy (CP)  (Status: Goal on Track (progressing): YES.) Long Term Goal  ?Evaluation of current treatment plan related to  CP ,  self-management and patient's adherence to plan as established by provider. 11-26-2021: The patient has an aide that comes in 5 days a week to assist with bathing and general ADLS/IADLS. She feels like her CP is stable and denies any new concerns related to CP. 02-25-2022: Her CP is stable. No acute changes noted. Still has her aide come in 5 days a week. Has a good support system.  ?Discussed plans with patient for ongoing care management follow up and provided patient with direct contact information for care management team ?Advised patient to call the office for changes in conditions, questions or concerns; ?Provided education to patient re: staying active, fall prevention and safety, nutritional status and improving  appetite ; ?Reviewed medications with patient and discussed compliance; ?Reviewed  scheduled/upcoming provider appointments including 03-05-2022 at 11 am; ?Discussed plans with patient for ongoing care management follow up and provided patient with direct contact information for care management team; ?Screening for signs and symptoms of depression related to chronic disease state;  ?Assessed social determinant of health barriers;   ?  ?Depression and Anxiety   (Status: Goal on Track (progressing): YES.) Long Term Goal  ?Evaluation of current treatment plan related to Anxiety and Depression, Mental Health Concerns  self-management and patient's adherence to plan as established by provider. 11-26-2021: The patient feels she is doing well. Denies any new needs or concerns with depression and anxiety. Says that she does have bad days sometimes but she works through those times. She denies any acute factors, needs, or questions, related to mental health. 02-25-2022: The patient states she is doing much bette since her hospital stay in March and the bowel infection she had. She states she was doubled over in pain and she had never experienced anything like it before. She states that she was in the hospital from 01-30-2022 to 02-03-2022. She states she is happy to be at home and has done well. She denies any acute findings today. Has seen urologist for renal mass and will have a follow up for this in 6 months.  ?Discussed plans with patient for ongoing care management follow up and provided patient with direct contact information for care management team ?Advised patient to call the office for changes in mood, anxiety, and depression ; ?Provided education to patient re: doing things she enjoys, being around positive people, finding a hobby that works for her, safety in the home; ?Reviewed medications with patient and discussed compliance. 02-25-2022: Is compliant with medications ; ?Reviewed scheduled/upcoming provider appointments including 03-05-2022 at 11 am; ?Discussed plans with patient for ongoing care  management follow up and provided patient with direct contact information for care management team; ?Screening for signs and symptoms of depression related to chronic disease state;  ?  ?Hyperlipidemia:  (Status: Goal on Track (progressing): YES.) ?     ?Lab Results  ?Component Value Date  ?  CHOL 162 12/24/2021  ?  HDL 48 12/24/2021  ?  LDLCALC 85 12/24/2021  ?  TRIG 172 (H) 12/24/2021  ?  CHOLHDL 3.4 12/24/2021  ?  ?  ?Medication review performed; medication list updated in electronic medical record. 02-25-2022: The patient takes Lipitor 40 mg daily without difficulty. The patient denies any medication needs at this time ?Provider established cholesterol goals reviewed; ?Counseled on importance of regular laboratory monitoring as prescribed. 02-25-2022: Review of needed laboratory monitoring for effective management of HLD ?Provided HLD educational materials; ?Reviewed role and benefits of statin for ASCVD risk reduction; ?Discussed strategies to manage statin-induced myalgias; ?Reviewed importance of limiting foods high in cholesterol. 02-25-2022: The patient denies any issues with dietary restrictions. She does not particularly watch what she eats. Education on the benefits of heart healthy diet ?  ?Hypertension: (Status: Goal on Track (progressing): YES.) ?Last practice recorded BP readings:  ?   ?BP Readings from Last 3 Encounters:  ?02/11/22 (!) 150/78  ?02/05/22 114/66  ?02/03/22 127/63  ?Most recent eGFR/CrCl:  ?     ?Lab Results  ?Component Value Date  ?  EGFR 101 02/05/2022  ?  No components found for: CRCL ?  ?Evaluation of current treatment plan related to hypertension self management and patient's adherence to plan as established by provider. 02-25-2022: The   patient checks her blood pressures periodically. The patient denies any issues with HTN or heart health;   ?Provided education to patient re: stroke prevention, s/s of heart attack and stroke; ?Reviewed prescribed diet heart healthy ?Reviewed  medications with patient and discussed importance of compliance. 02-25-2022: The patient states compliance with medications.  ?Discussed plans with patient for ongoing care management follow up and provided patient with dir

## 2022-02-27 DIAGNOSIS — E785 Hyperlipidemia, unspecified: Secondary | ICD-10-CM | POA: Diagnosis not present

## 2022-02-27 DIAGNOSIS — K5792 Diverticulitis of intestine, part unspecified, without perforation or abscess without bleeding: Secondary | ICD-10-CM | POA: Diagnosis not present

## 2022-02-27 DIAGNOSIS — I10 Essential (primary) hypertension: Secondary | ICD-10-CM | POA: Diagnosis not present

## 2022-02-27 DIAGNOSIS — A4151 Sepsis due to Escherichia coli [E. coli]: Secondary | ICD-10-CM | POA: Diagnosis not present

## 2022-02-27 DIAGNOSIS — N3001 Acute cystitis with hematuria: Secondary | ICD-10-CM | POA: Diagnosis not present

## 2022-02-27 DIAGNOSIS — G809 Cerebral palsy, unspecified: Secondary | ICD-10-CM | POA: Diagnosis not present

## 2022-02-28 DIAGNOSIS — I10 Essential (primary) hypertension: Secondary | ICD-10-CM | POA: Diagnosis not present

## 2022-02-28 DIAGNOSIS — N3001 Acute cystitis with hematuria: Secondary | ICD-10-CM | POA: Diagnosis not present

## 2022-02-28 DIAGNOSIS — K5792 Diverticulitis of intestine, part unspecified, without perforation or abscess without bleeding: Secondary | ICD-10-CM | POA: Diagnosis not present

## 2022-02-28 DIAGNOSIS — A4151 Sepsis due to Escherichia coli [E. coli]: Secondary | ICD-10-CM | POA: Diagnosis not present

## 2022-02-28 DIAGNOSIS — E785 Hyperlipidemia, unspecified: Secondary | ICD-10-CM | POA: Diagnosis not present

## 2022-02-28 DIAGNOSIS — G809 Cerebral palsy, unspecified: Secondary | ICD-10-CM | POA: Diagnosis not present

## 2022-03-03 DIAGNOSIS — I10 Essential (primary) hypertension: Secondary | ICD-10-CM | POA: Diagnosis not present

## 2022-03-03 DIAGNOSIS — G809 Cerebral palsy, unspecified: Secondary | ICD-10-CM | POA: Diagnosis not present

## 2022-03-03 DIAGNOSIS — N3001 Acute cystitis with hematuria: Secondary | ICD-10-CM | POA: Diagnosis not present

## 2022-03-03 DIAGNOSIS — K5792 Diverticulitis of intestine, part unspecified, without perforation or abscess without bleeding: Secondary | ICD-10-CM | POA: Diagnosis not present

## 2022-03-03 DIAGNOSIS — A4151 Sepsis due to Escherichia coli [E. coli]: Secondary | ICD-10-CM | POA: Diagnosis not present

## 2022-03-03 DIAGNOSIS — E785 Hyperlipidemia, unspecified: Secondary | ICD-10-CM | POA: Diagnosis not present

## 2022-03-05 ENCOUNTER — Ambulatory Visit (INDEPENDENT_AMBULATORY_CARE_PROVIDER_SITE_OTHER): Payer: Medicare Other | Admitting: Internal Medicine

## 2022-03-05 ENCOUNTER — Encounter: Payer: Self-pay | Admitting: Internal Medicine

## 2022-03-05 DIAGNOSIS — M810 Age-related osteoporosis without current pathological fracture: Secondary | ICD-10-CM | POA: Diagnosis not present

## 2022-03-05 DIAGNOSIS — E7800 Pure hypercholesterolemia, unspecified: Secondary | ICD-10-CM | POA: Diagnosis not present

## 2022-03-05 DIAGNOSIS — G801 Spastic diplegic cerebral palsy: Secondary | ICD-10-CM | POA: Diagnosis not present

## 2022-03-05 DIAGNOSIS — E119 Type 2 diabetes mellitus without complications: Secondary | ICD-10-CM

## 2022-03-05 DIAGNOSIS — E118 Type 2 diabetes mellitus with unspecified complications: Secondary | ICD-10-CM | POA: Insufficient documentation

## 2022-03-05 MED ORDER — FLUOXETINE HCL 20 MG PO CAPS: 20.0000 mg | ORAL_CAPSULE | Freq: Every day | ORAL | 0 refills | Status: DC

## 2022-03-05 MED ORDER — HYDROCHLOROTHIAZIDE 25 MG PO TABS: 25.0000 mg | ORAL_TABLET | Freq: Every day | ORAL | 3 refills | Status: DC

## 2022-03-05 MED ORDER — PANTOPRAZOLE SODIUM 40 MG PO TBEC: 40.0000 mg | DELAYED_RELEASE_TABLET | Freq: Every day | ORAL | 0 refills | Status: DC

## 2022-03-05 MED ORDER — ATORVASTATIN CALCIUM 40 MG PO TABS: 40.0000 mg | ORAL_TABLET | Freq: Every day | ORAL | 0 refills | Status: DC

## 2022-03-05 MED ORDER — IBANDRONATE SODIUM 150 MG PO TABS: 150.0000 mg | ORAL_TABLET | ORAL | 1 refills | Status: DC

## 2022-03-05 MED ORDER — BACLOFEN 10 MG PO TABS: ORAL_TABLET | ORAL | 0 refills | Status: DC

## 2022-03-05 NOTE — Progress Notes (Signed)
? ?BP 139/74   Pulse 71   Temp 98.4 ?F (36.9 ?C) (Oral)   SpO2 96%   ? ?Subjective:  ? ? Patient ID: Michelle Chandler, female    DOB: Dec 10, 1955, 66 y.o.   MRN: [id number] ? ?Chief Complaint  ?Patient presents with  ?? Diverticulitis  ?  F/U  ? ? ?HPI: ?Michelle Chandler is a 66 y.o. female ? ?New onset renal cell carcinoma. Pt feels well has persistent hematuria microscopic. Denies any weight changes, no bleeding per visual inspeciton at home. Deneis any abdominal symptoms  ?Would like refills on her  ? ? ?Chief Complaint  ?Patient presents with  ?? Diverticulitis  ?  F/U  ? ? ?Relevant past medical, surgical, family and social history reviewed and updated as indicated. Interim medical history since our last visit reviewed. ?Allergies and medications reviewed and updated. ? ?Review of Systems  ?Constitutional:  Negative for activity change, appetite change, chills, fatigue and fever.  ?HENT:  Negative for congestion, ear discharge, ear pain and facial swelling.   ?Eyes:  Negative for pain and itching.  ?Respiratory:  Negative for cough, chest tightness, shortness of breath and wheezing.   ?Cardiovascular:  Negative for chest pain, palpitations and leg swelling.  ?Gastrointestinal:  Negative for abdominal distention, abdominal pain, blood in stool, constipation, diarrhea, nausea and vomiting.  ?Endocrine: Negative for cold intolerance, heat intolerance, polydipsia, polyphagia and polyuria.  ?Genitourinary:  Negative for difficulty urinating, dysuria, flank pain, frequency, hematuria and urgency.  ?Musculoskeletal:  Negative for arthralgias, gait problem, joint swelling and myalgias.  ?Skin:  Negative for color change, rash and wound.  ?Neurological:  Negative for dizziness, tremors, speech difficulty, weakness, light-headedness, numbness and headaches.  ?Hematological:  Does not bruise/bleed easily.  ?Psychiatric/Behavioral:  Negative for agitation, confusion, decreased concentration, sleep disturbance and suicidal ideas.    ? ?Per HPI unless specifically indicated above ? ?   ?Objective:  ?  ?BP 139/74   Pulse 71   Temp 98.4 ?F (36.9 ?C) (Oral)   SpO2 96%   ?Wt Readings from Last 3 Encounters:  ?02/11/22 150 lb (68 kg)  ?02/05/22 150 lb (68 kg)  ?01/30/22 150 lb (68 kg)  ?  ?Physical Exam ?Vitals and nursing note reviewed.  ?Constitutional:   ?   General: She is not in acute distress. ?   Appearance: Normal appearance. She is not ill-appearing or diaphoretic.  ?Eyes:  ?   Conjunctiva/sclera: Conjunctivae normal.  ?Cardiovascular:  ?   Rate and Rhythm: Normal rate and regular rhythm.  ?   Heart sounds: No murmur heard. ?  No friction rub. No gallop.  ?Pulmonary:  ?   Effort: No respiratory distress.  ?   Breath sounds: No stridor. No wheezing, rhonchi or rales.  ?Chest:  ?   Chest wall: No tenderness.  ?Abdominal:  ?   General: Abdomen is flat. Bowel sounds are normal. There is no distension.  ?   Palpations: Abdomen is soft. There is no mass.  ?   Tenderness: There is no abdominal tenderness. There is no guarding.  ?Skin: ?   General: Skin is warm and dry.  ?   Coloration: Skin is not jaundiced.  ?   Findings: No erythema.  ?Neurological:  ?   Mental Status: She is alert.  ?Psychiatric:     ?   Mood and Affect: Mood normal.     ?   Behavior: Behavior normal.     ?   Thought Content: Thought content normal.  ? ? ?  Results for orders placed or performed in visit on 02/11/22  ?Microscopic Examination  ? Urine  ?Result Value Ref Range  ? WBC, UA 0-5 0 - 5 /hpf  ? RBC 0-2 0 - 2 /hpf  ? Epithelial Cells (non renal) 0-10 0 - 10 /hpf  ? Bacteria, UA None seen None seen/Few  ?Urinalysis, Complete  ?Result Value Ref Range  ? Specific Gravity, UA 1.010 1.005 - 1.030  ? pH, UA 7.0 5.0 - 7.5  ? Color, UA Yellow Yellow  ? Appearance Ur Clear Clear  ? Leukocytes,UA Trace (A) Negative  ? Protein,UA Negative Negative/Trace  ? Glucose, UA Negative Negative  ? Ketones, UA Negative Negative  ? RBC, UA 1+ (A) Negative  ? Bilirubin, UA Negative Negative   ? Urobilinogen, Ur 0.2 0.2 - 1.0 mg/dL  ? Nitrite, UA Negative Negative  ? Microscopic Examination See below:   ? ?   ? ? ?Current Outpatient Medications:  ??  diphenhydrAMINE (BENADRYL) 25 MG tablet, Take 25 mg by mouth every 6 (six) hours as needed for itching or allergies., Disp: , Rfl:  ??  Incontinence Supply Disposable (ENTRUST PLUS DISP UNDERPADS) MISC, by Does not apply route., Disp: , Rfl:  ??  Incontinence Supply Disposable (PROTECTIVE UNDERWEAR LARGE) MISC, by Does not apply route., Disp: , Rfl:  ??  loperamide (IMODIUM) 2 MG capsule, Take 1 capsule (2 mg total) by mouth every 8 (eight) hours as needed for diarrhea or loose stools., Disp: 10 capsule, Rfl: 0 ??  sucralfate (CARAFATE) 1 GM/10ML suspension, Take 10 mLs (1 g total) by mouth 4 (four) times daily -  with meals and at bedtime., Disp: 1242 mL, Rfl: 0 ??  atorvastatin (LIPITOR) 40 MG tablet, Take 1 tablet (40 mg total) by mouth daily., Disp: 90 tablet, Rfl: 0 ??  baclofen (LIORESAL) 10 MG tablet, Take 1 tablet (10 mg total) by mouth every evening., Disp: 30 tablet, Rfl: 0 ??  famotidine (PEPCID) 20 MG tablet, Take 1 tablet (20 mg total) by mouth 2 (two) times daily. (Patient not taking: Reported on 03/05/2022), Disp: 30 tablet, Rfl: 4 ??  FLUoxetine (PROZAC) 20 MG capsule, Take 1 capsule (20 mg total) by mouth daily., Disp: 90 capsule, Rfl: 0 ??  ibandronate (BONIVA) 150 MG tablet, Take 1 tablet (150 mg total) by mouth every 30 (thirty) days. Take in the morning with a full glass of water, on an empty stomach, and do not take anything else by mouth or lie down for the next 30 min. (Patient not taking: Reported on 03/05/2022), Disp: 12 tablet, Rfl: 1 ??  pantoprazole (PROTONIX) 40 MG tablet, Take 1 tablet (40 mg total) by mouth daily., Disp: 30 tablet, Rfl: 0  ? ? ?Assessment & Plan:  ?Renal cell carcinoma stable seen urology for such ?To wait and watch in 6 months. Has a 2 cm solid right renal mass.   ?Fu with urology and mx per such  ?Pt unsure If  she wants to wait and watch o, had  ho CP and will d/w mom and family.  ? ? ? ? ?Problem List Items Addressed This Visit   ? ?  ? Nervous and Auditory  ? Cerebral palsy ([place])  ? Relevant Medications  ? baclofen (LIORESAL) 10 MG tablet  ?  ? Musculoskeletal and Integument  ? Osteoporosis  ? Relevant Medications  ? baclofen (LIORESAL) 10 MG tablet  ?  ? Other  ? Hyperlipidemia  ? Relevant Medications  ? atorvastatin (LIPITOR) 40 MG  tablet  ? baclofen (LIORESAL) 10 MG tablet  ? ?Other Visit Diagnoses   ? ? Diabetes mellitus without complication ([place])      ? Relevant Medications  ? atorvastatin (LIPITOR) 40 MG tablet  ? baclofen (LIORESAL) 10 MG tablet  ? ?  ?  ? ?No orders of the defined types were placed in this encounter. ?  ? ?Meds ordered this encounter  ?Medications  ?? atorvastatin (LIPITOR) 40 MG tablet  ?  Sig: Take 1 tablet (40 mg total) by mouth daily.  ?  Dispense:  90 tablet  ?  Refill:  0  ?? baclofen (LIORESAL) 10 MG tablet  ?  Sig: Take 1 tablet (10 mg total) by mouth every evening.  ?  Dispense:  30 tablet  ?  Refill:  0  ?? FLUoxetine (PROZAC) 20 MG capsule  ?  Sig: Take 1 capsule (20 mg total) by mouth daily.  ?  Dispense:  90 capsule  ?  Refill:  0  ?? pantoprazole (PROTONIX) 40 MG tablet  ?  Sig: Take 1 tablet (40 mg total) by mouth daily.  ?  Dispense:  30 tablet  ?  Refill:  0  ?  ? ?Follow up plan: ?No follow-ups on file. ? ? ?

## 2022-03-06 DIAGNOSIS — F17210 Nicotine dependence, cigarettes, uncomplicated: Secondary | ICD-10-CM | POA: Diagnosis not present

## 2022-03-06 DIAGNOSIS — I10 Essential (primary) hypertension: Secondary | ICD-10-CM | POA: Diagnosis not present

## 2022-03-06 DIAGNOSIS — E785 Hyperlipidemia, unspecified: Secondary | ICD-10-CM | POA: Diagnosis not present

## 2022-03-06 DIAGNOSIS — A4151 Sepsis due to Escherichia coli [E. coli]: Secondary | ICD-10-CM | POA: Diagnosis not present

## 2022-03-06 DIAGNOSIS — Z791 Long term (current) use of non-steroidal anti-inflammatories (NSAID): Secondary | ICD-10-CM | POA: Diagnosis not present

## 2022-03-06 DIAGNOSIS — I872 Venous insufficiency (chronic) (peripheral): Secondary | ICD-10-CM | POA: Diagnosis not present

## 2022-03-06 DIAGNOSIS — J309 Allergic rhinitis, unspecified: Secondary | ICD-10-CM | POA: Diagnosis not present

## 2022-03-06 DIAGNOSIS — R7301 Impaired fasting glucose: Secondary | ICD-10-CM | POA: Diagnosis not present

## 2022-03-06 DIAGNOSIS — F32A Depression, unspecified: Secondary | ICD-10-CM | POA: Diagnosis not present

## 2022-03-06 DIAGNOSIS — M7550 Bursitis of unspecified shoulder: Secondary | ICD-10-CM | POA: Diagnosis not present

## 2022-03-06 DIAGNOSIS — G809 Cerebral palsy, unspecified: Secondary | ICD-10-CM | POA: Diagnosis not present

## 2022-03-06 DIAGNOSIS — R42 Dizziness and giddiness: Secondary | ICD-10-CM | POA: Diagnosis not present

## 2022-03-06 DIAGNOSIS — R270 Ataxia, unspecified: Secondary | ICD-10-CM | POA: Diagnosis not present

## 2022-03-06 DIAGNOSIS — Z9181 History of falling: Secondary | ICD-10-CM | POA: Diagnosis not present

## 2022-03-06 DIAGNOSIS — N3001 Acute cystitis with hematuria: Secondary | ICD-10-CM | POA: Diagnosis not present

## 2022-03-06 DIAGNOSIS — K5792 Diverticulitis of intestine, part unspecified, without perforation or abscess without bleeding: Secondary | ICD-10-CM | POA: Diagnosis not present

## 2022-03-10 DIAGNOSIS — K5792 Diverticulitis of intestine, part unspecified, without perforation or abscess without bleeding: Secondary | ICD-10-CM | POA: Diagnosis not present

## 2022-03-10 DIAGNOSIS — I10 Essential (primary) hypertension: Secondary | ICD-10-CM | POA: Diagnosis not present

## 2022-03-10 DIAGNOSIS — G809 Cerebral palsy, unspecified: Secondary | ICD-10-CM | POA: Diagnosis not present

## 2022-03-10 DIAGNOSIS — N3001 Acute cystitis with hematuria: Secondary | ICD-10-CM | POA: Diagnosis not present

## 2022-03-10 DIAGNOSIS — E785 Hyperlipidemia, unspecified: Secondary | ICD-10-CM | POA: Diagnosis not present

## 2022-03-10 DIAGNOSIS — A4151 Sepsis due to Escherichia coli [E. coli]: Secondary | ICD-10-CM | POA: Diagnosis not present

## 2022-03-13 DIAGNOSIS — Z20822 Contact with and (suspected) exposure to covid-19: Secondary | ICD-10-CM | POA: Diagnosis not present

## 2022-03-15 DIAGNOSIS — E7800 Pure hypercholesterolemia, unspecified: Secondary | ICD-10-CM

## 2022-03-15 DIAGNOSIS — F339 Major depressive disorder, recurrent, unspecified: Secondary | ICD-10-CM | POA: Diagnosis not present

## 2022-03-15 DIAGNOSIS — I10 Essential (primary) hypertension: Secondary | ICD-10-CM

## 2022-03-18 DIAGNOSIS — E785 Hyperlipidemia, unspecified: Secondary | ICD-10-CM | POA: Diagnosis not present

## 2022-03-18 DIAGNOSIS — G809 Cerebral palsy, unspecified: Secondary | ICD-10-CM | POA: Diagnosis not present

## 2022-03-18 DIAGNOSIS — K5792 Diverticulitis of intestine, part unspecified, without perforation or abscess without bleeding: Secondary | ICD-10-CM | POA: Diagnosis not present

## 2022-03-18 DIAGNOSIS — N3001 Acute cystitis with hematuria: Secondary | ICD-10-CM | POA: Diagnosis not present

## 2022-03-18 DIAGNOSIS — I10 Essential (primary) hypertension: Secondary | ICD-10-CM | POA: Diagnosis not present

## 2022-03-18 DIAGNOSIS — A4151 Sepsis due to Escherichia coli [E. coli]: Secondary | ICD-10-CM | POA: Diagnosis not present

## 2022-03-23 ENCOUNTER — Ambulatory Visit: Payer: Medicare Other | Admitting: Internal Medicine

## 2022-03-25 DIAGNOSIS — G809 Cerebral palsy, unspecified: Secondary | ICD-10-CM | POA: Diagnosis not present

## 2022-03-25 DIAGNOSIS — E785 Hyperlipidemia, unspecified: Secondary | ICD-10-CM | POA: Diagnosis not present

## 2022-03-25 DIAGNOSIS — K5792 Diverticulitis of intestine, part unspecified, without perforation or abscess without bleeding: Secondary | ICD-10-CM | POA: Diagnosis not present

## 2022-03-25 DIAGNOSIS — N3001 Acute cystitis with hematuria: Secondary | ICD-10-CM | POA: Diagnosis not present

## 2022-03-25 DIAGNOSIS — A4151 Sepsis due to Escherichia coli [E. coli]: Secondary | ICD-10-CM | POA: Diagnosis not present

## 2022-03-25 DIAGNOSIS — I10 Essential (primary) hypertension: Secondary | ICD-10-CM | POA: Diagnosis not present

## 2022-03-31 DIAGNOSIS — G809 Cerebral palsy, unspecified: Secondary | ICD-10-CM | POA: Diagnosis not present

## 2022-03-31 DIAGNOSIS — A4151 Sepsis due to Escherichia coli [E. coli]: Secondary | ICD-10-CM | POA: Diagnosis not present

## 2022-03-31 DIAGNOSIS — I10 Essential (primary) hypertension: Secondary | ICD-10-CM | POA: Diagnosis not present

## 2022-03-31 DIAGNOSIS — E785 Hyperlipidemia, unspecified: Secondary | ICD-10-CM | POA: Diagnosis not present

## 2022-03-31 DIAGNOSIS — K5792 Diverticulitis of intestine, part unspecified, without perforation or abscess without bleeding: Secondary | ICD-10-CM | POA: Diagnosis not present

## 2022-03-31 DIAGNOSIS — N3001 Acute cystitis with hematuria: Secondary | ICD-10-CM | POA: Diagnosis not present

## 2022-04-03 ENCOUNTER — Telehealth: Payer: Self-pay | Admitting: Internal Medicine

## 2022-04-03 NOTE — Telephone Encounter (Signed)
Home Health Verbal Orders - Caller/Agency: [facility] Number: [phone]  Requesting OT/PT/Skilled Nursing/Social Work/Speech Therapy: PT  Frequency: 1w9

## 2022-04-04 DIAGNOSIS — R059 Cough, unspecified: Secondary | ICD-10-CM | POA: Diagnosis not present

## 2022-04-04 DIAGNOSIS — B9689 Other specified bacterial agents as the cause of diseases classified elsewhere: Secondary | ICD-10-CM | POA: Diagnosis not present

## 2022-04-04 DIAGNOSIS — J069 Acute upper respiratory infection, unspecified: Secondary | ICD-10-CM | POA: Diagnosis not present

## 2022-04-04 DIAGNOSIS — R062 Wheezing: Secondary | ICD-10-CM | POA: Diagnosis not present

## 2022-04-05 DIAGNOSIS — R7301 Impaired fasting glucose: Secondary | ICD-10-CM | POA: Diagnosis not present

## 2022-04-05 DIAGNOSIS — K5792 Diverticulitis of intestine, part unspecified, without perforation or abscess without bleeding: Secondary | ICD-10-CM | POA: Diagnosis not present

## 2022-04-05 DIAGNOSIS — J309 Allergic rhinitis, unspecified: Secondary | ICD-10-CM | POA: Diagnosis not present

## 2022-04-05 DIAGNOSIS — I872 Venous insufficiency (chronic) (peripheral): Secondary | ICD-10-CM | POA: Diagnosis not present

## 2022-04-05 DIAGNOSIS — F17210 Nicotine dependence, cigarettes, uncomplicated: Secondary | ICD-10-CM | POA: Diagnosis not present

## 2022-04-05 DIAGNOSIS — N2889 Other specified disorders of kidney and ureter: Secondary | ICD-10-CM | POA: Diagnosis not present

## 2022-04-05 DIAGNOSIS — Z87440 Personal history of urinary (tract) infections: Secondary | ICD-10-CM | POA: Diagnosis not present

## 2022-04-05 DIAGNOSIS — E785 Hyperlipidemia, unspecified: Secondary | ICD-10-CM | POA: Diagnosis not present

## 2022-04-05 DIAGNOSIS — Z9181 History of falling: Secondary | ICD-10-CM | POA: Diagnosis not present

## 2022-04-05 DIAGNOSIS — G809 Cerebral palsy, unspecified: Secondary | ICD-10-CM | POA: Diagnosis not present

## 2022-04-05 DIAGNOSIS — F32A Depression, unspecified: Secondary | ICD-10-CM | POA: Diagnosis not present

## 2022-04-05 DIAGNOSIS — R42 Dizziness and giddiness: Secondary | ICD-10-CM | POA: Diagnosis not present

## 2022-04-05 DIAGNOSIS — Z791 Long term (current) use of non-steroidal anti-inflammatories (NSAID): Secondary | ICD-10-CM | POA: Diagnosis not present

## 2022-04-05 DIAGNOSIS — Z7983 Long term (current) use of bisphosphonates: Secondary | ICD-10-CM | POA: Diagnosis not present

## 2022-04-05 DIAGNOSIS — I10 Essential (primary) hypertension: Secondary | ICD-10-CM | POA: Diagnosis not present

## 2022-04-05 DIAGNOSIS — M7550 Bursitis of unspecified shoulder: Secondary | ICD-10-CM | POA: Diagnosis not present

## 2022-04-05 DIAGNOSIS — R270 Ataxia, unspecified: Secondary | ICD-10-CM | POA: Diagnosis not present

## 2022-04-06 ENCOUNTER — Other Ambulatory Visit: Payer: Self-pay | Admitting: Internal Medicine

## 2022-04-06 DIAGNOSIS — E119 Type 2 diabetes mellitus without complications: Secondary | ICD-10-CM

## 2022-04-06 DIAGNOSIS — G801 Spastic diplegic cerebral palsy: Secondary | ICD-10-CM

## 2022-04-06 DIAGNOSIS — E7800 Pure hypercholesterolemia, unspecified: Secondary | ICD-10-CM

## 2022-04-06 DIAGNOSIS — M810 Age-related osteoporosis without current pathological fracture: Secondary | ICD-10-CM

## 2022-04-07 NOTE — Telephone Encounter (Signed)
Requested Prescriptions  Pending Prescriptions Disp Refills  . baclofen (LIORESAL) 10 MG tablet [Pharmacy Med Name: BACLOFEN 10 MG TABLET] 30 tablet 0    Sig: Take 1 tablet (10 mg total) by mouthevery evening.     Analgesics:  Muscle Relaxants - baclofen Passed - 04/06/2022  1:57 PM      Passed - Cr in normal range and within 180 days    Creatinine, Ser  Date Value Ref Range Status  02/05/2022 0.60 0.57 - 1.00 mg/dL Final         Passed - eGFR is 30 or above and within 180 days    GFR calc Af Amer  Date Value Ref Range Status  07/11/2020 108 >59 mL/min/1.73 Final    Comment:    **[facility] currently reports eGFR in compliance with the current**   recommendations of the [employer]. [facility] will   update reporting as new guidelines are published from the NKF-ASN   Task force.    GFR, Estimated  Date Value Ref Range Status  02/02/2022 >60 >60 mL/min Final    Comment:    (NOTE) Calculated using the CKD-EPI Creatinine Equation (2021)    eGFR  Date Value Ref Range Status  02/05/2022 101 >59 mL/min/1.73 Final         Passed - Valid encounter within last 6 months    Recent Outpatient Visits          1 month ago Diabetes mellitus without complication ([place])   [facility] Vigg, Avanti, MD   2 months ago Diverticulitis   [facility] Vigg, Avanti, MD   3 months ago Diabetes mellitus without complication ([place])   [facility] Vigg, Avanti, MD   6 months ago Prediabetes   [facility] Vigg, Avanti, MD   10 months ago Prediabetes   [facility] Vigg, Avanti, MD      Future Appointments            In 1 week Vigg, Avanti, MD [facility]   In 3 months  [employer], [place]

## 2022-04-08 NOTE — Telephone Encounter (Signed)
Verbal orders given  

## 2022-04-08 NOTE — Telephone Encounter (Signed)
[place] for PT as required for home health.

## 2022-04-08 NOTE — Telephone Encounter (Signed)
LVM with home health to return call about verbal orders

## 2022-04-09 ENCOUNTER — Telehealth: Payer: Self-pay

## 2022-04-09 DIAGNOSIS — I10 Essential (primary) hypertension: Secondary | ICD-10-CM | POA: Diagnosis not present

## 2022-04-09 DIAGNOSIS — E785 Hyperlipidemia, unspecified: Secondary | ICD-10-CM | POA: Diagnosis not present

## 2022-04-09 DIAGNOSIS — K5792 Diverticulitis of intestine, part unspecified, without perforation or abscess without bleeding: Secondary | ICD-10-CM | POA: Diagnosis not present

## 2022-04-09 DIAGNOSIS — G809 Cerebral palsy, unspecified: Secondary | ICD-10-CM | POA: Diagnosis not present

## 2022-04-09 DIAGNOSIS — M7550 Bursitis of unspecified shoulder: Secondary | ICD-10-CM | POA: Diagnosis not present

## 2022-04-09 DIAGNOSIS — R7301 Impaired fasting glucose: Secondary | ICD-10-CM | POA: Diagnosis not present

## 2022-04-09 NOTE — Chronic Care Management (AMB) (Signed)
Chronic Care Management Pharmacy Assistant   Name: Michelle Chandler  MRN: [id number] DOB: 05-30-1956   Reason for Encounter: Disease State General     Recent office visits:  03/05/22-Avanti Neomia Dear, MD (PCP) See for diverticulitis follow up visit. Follow up in 6 weeks. 02/05/22-[name], MD (PCP) Hospital follow up visit. Follow up in 4 weeks. 12/24/21-[name], MD (PCP) General follow up visit. Labs ordered. Start on Bacatrim 800-160 mg twice daily for 5 days. Follow up in 3 months.   Recent consult visits:  02/11/22-Scott C. Stoioff, MD (Urology) Seen for renal mass.   Hospital visits:  Medication Reconciliation was completed by comparing discharge summary, patient's EMR and Pharmacy list, and upon discussion with patient.  Admitted to the hospital on 01/30/22 due to Abdominal pain Discharge date was 02/03/22. Discharged from [facility]?Medications Started at [facility] Discharge:?? -started Augmentin875-125 mg Twice daily  Medication Changes at Hospital Discharge: -Changed None noted  Medications Discontinued at Hospital Discharge: -Stopped None noted  Medications that remain the same after Hospital Discharge:??  -All other medications will remain the same.    Medications: Outpatient Encounter Medications as of 04/09/2022  Medication Sig   atorvastatin (LIPITOR) 40 MG tablet Take 1 tablet (40 mg total) by mouth daily.   baclofen (LIORESAL) 10 MG tablet Take 1 tablet (10 mg total) by mouthevery evening.   diphenhydrAMINE (BENADRYL) 25 MG tablet Take 25 mg by mouth every 6 (six) hours as needed for itching or allergies.   FLUoxetine (PROZAC) 20 MG capsule Take 1 capsule (20 mg total) by mouth daily.   hydrochlorothiazide (HYDRODIURIL) 25 MG tablet Take 1 tablet (25 mg total) by mouth daily.   ibandronate (BONIVA) 150 MG tablet Take 1 tablet (150 mg total) by mouth every 30 (thirty) days. Take in the morning with a full glass of water, on an empty  stomach, and do not take anything else by mouth or lie down for the next 30 min.   Incontinence Supply Disposable (ENTRUST PLUS DISP UNDERPADS) MISC by Does not apply route.   Incontinence Supply Disposable (PROTECTIVE UNDERWEAR LARGE) MISC by Does not apply route.   loperamide (IMODIUM) 2 MG capsule Take 1 capsule (2 mg total) by mouth every 8 (eight) hours as needed for diarrhea or loose stools.   pantoprazole (PROTONIX) 40 MG tablet Take 1 tablet (40 mg total) by mouth daily.   No facility-administered encounter medications on file as of 04/09/2022.   [place] for General Review Call   Chart Review:  Have there been any documented new, changed, or discontinued medications since last visit? Yes (If yes, include name, dose, frequency, date) 12/24/21-[name], MD (PCP) Start on Bacatrim 800-160 mg twice daily for UTI.  Has there been any documented recent hospitalizations or ED visits since last visit with Clinical Pharmacist? Yes    Adherence Review:  Does the Clinical Pharmacist Assistant have access to adherence rates? Yes  Adherence rates for STAR metric medications (List medication(s)/day supply/ last 2 fill dates). See list below.  Does the patient have >5 day gap between last estimated fill dates for any of the above medications or other medication gaps? No    Disease State Questions:  Able to connect with Patient? Yes Did patient have any problems with their health recently? No Note problems and Concerns:Patient states she has been doing good no problems at the moment.  Have you had any admissions or emergency room visits or worsening of your condition(s)  since last visit? Yes Details of ED visit, hospital visit and/or worsening condition(s):See hospital note above.  Have you had any visits with new specialists or providers since your last visit? No Explain:N/a  Have you had any new health care problem(s) since your last visit? No New problem(s)  reported:Patient states she has no new health care problems.  Have you run out of any of your medications since you last spoke with clinical pharmacist? No What caused you to run out of your medications? Patient states she recently asked for a refill on Bacflofen which was sent in on  04/07/22. I have confirmed her pharmacy and confirmed that it was sent in that day. Patient states she will pick it up later today.  Are there any medications you are not taking as prescribed? No What kept you from taking your medications as prescribed? N/a  Are you having any issues or side effects with your medications? No Note of issues or side effects:Patient states she has not had any side effects to any of her medications.  Do you have any other health concerns or questions you want to discuss with your Clinical Pharmacist before your next visit? No Note additional concerns and questions from Patient. Patient states there is nothing at this time.  Are there any health concerns that you feel we can do a better job addressing? No Note Patient's response. Patient states there is nothing at this time.  Are you having any problems with any of the following since the last visit: (select all that apply)  None  Details:N/a  12. Any falls since last visit? No  Details:Patient states she has not had any falls.   13. Any increased or uncontrolled pain since last visit? No  Details:Patient states she has not experienced any pain.  14. Next visit Type: office       Visit with:Avanit Vigg, MD        Date:04/16/22       Time:11:20 am  15. Additional Details? No    Care Gaps: OPHTHALMOLOGY EXAM:Never done URINE MICROALBUMIN:Never done  Star Rating Drugs: Atorvastatin 40 mg Last filled:03/05/22 90 DS  Myriam Elta [place], [place]

## 2022-04-16 ENCOUNTER — Ambulatory Visit: Payer: Medicare Other | Admitting: Internal Medicine

## 2022-04-16 DIAGNOSIS — R7301 Impaired fasting glucose: Secondary | ICD-10-CM | POA: Diagnosis not present

## 2022-04-16 DIAGNOSIS — K5792 Diverticulitis of intestine, part unspecified, without perforation or abscess without bleeding: Secondary | ICD-10-CM | POA: Diagnosis not present

## 2022-04-16 DIAGNOSIS — M7550 Bursitis of unspecified shoulder: Secondary | ICD-10-CM | POA: Diagnosis not present

## 2022-04-16 DIAGNOSIS — G809 Cerebral palsy, unspecified: Secondary | ICD-10-CM | POA: Diagnosis not present

## 2022-04-16 DIAGNOSIS — E785 Hyperlipidemia, unspecified: Secondary | ICD-10-CM | POA: Diagnosis not present

## 2022-04-16 DIAGNOSIS — I10 Essential (primary) hypertension: Secondary | ICD-10-CM | POA: Diagnosis not present

## 2022-04-24 DIAGNOSIS — K5792 Diverticulitis of intestine, part unspecified, without perforation or abscess without bleeding: Secondary | ICD-10-CM | POA: Diagnosis not present

## 2022-04-24 DIAGNOSIS — R7301 Impaired fasting glucose: Secondary | ICD-10-CM | POA: Diagnosis not present

## 2022-04-24 DIAGNOSIS — G809 Cerebral palsy, unspecified: Secondary | ICD-10-CM | POA: Diagnosis not present

## 2022-04-24 DIAGNOSIS — E785 Hyperlipidemia, unspecified: Secondary | ICD-10-CM | POA: Diagnosis not present

## 2022-04-24 DIAGNOSIS — M7550 Bursitis of unspecified shoulder: Secondary | ICD-10-CM | POA: Diagnosis not present

## 2022-04-24 DIAGNOSIS — I10 Essential (primary) hypertension: Secondary | ICD-10-CM | POA: Diagnosis not present

## 2022-04-28 DIAGNOSIS — I10 Essential (primary) hypertension: Secondary | ICD-10-CM | POA: Diagnosis not present

## 2022-04-28 DIAGNOSIS — E785 Hyperlipidemia, unspecified: Secondary | ICD-10-CM | POA: Diagnosis not present

## 2022-04-28 DIAGNOSIS — G809 Cerebral palsy, unspecified: Secondary | ICD-10-CM | POA: Diagnosis not present

## 2022-04-28 DIAGNOSIS — K5792 Diverticulitis of intestine, part unspecified, without perforation or abscess without bleeding: Secondary | ICD-10-CM | POA: Diagnosis not present

## 2022-04-28 DIAGNOSIS — M7550 Bursitis of unspecified shoulder: Secondary | ICD-10-CM | POA: Diagnosis not present

## 2022-04-28 DIAGNOSIS — R7301 Impaired fasting glucose: Secondary | ICD-10-CM | POA: Diagnosis not present

## 2022-04-29 ENCOUNTER — Telehealth: Payer: Medicare Other

## 2022-04-29 ENCOUNTER — Ambulatory Visit (INDEPENDENT_AMBULATORY_CARE_PROVIDER_SITE_OTHER): Payer: Medicare Other

## 2022-04-29 DIAGNOSIS — I10 Essential (primary) hypertension: Secondary | ICD-10-CM

## 2022-04-29 DIAGNOSIS — F339 Major depressive disorder, recurrent, unspecified: Secondary | ICD-10-CM

## 2022-04-29 DIAGNOSIS — N2889 Other specified disorders of kidney and ureter: Secondary | ICD-10-CM

## 2022-04-29 DIAGNOSIS — F419 Anxiety disorder, unspecified: Secondary | ICD-10-CM

## 2022-04-29 DIAGNOSIS — E785 Hyperlipidemia, unspecified: Secondary | ICD-10-CM

## 2022-04-29 DIAGNOSIS — G809 Cerebral palsy, unspecified: Secondary | ICD-10-CM

## 2022-04-29 NOTE — Patient Instructions (Signed)
Visit Information  Thank you for taking time to visit with me today. Please don't hesitate to contact me if I can be of assistance to you before our next scheduled telephone appointment.  Following are the goals we discussed today:  Cerebral Palsy (CP)  (Status: Goal on Track (progressing): YES.) Long Term Goal  Evaluation of current treatment plan related to  CP ,  self-management and patient's adherence to plan as established by provider. 11-26-2021: The patient has an aide that comes in 5 days a week to assist with bathing and general ADLS/IADLS. She feels like her CP is stable and denies any new concerns related to CP. 04-29-2022: Her CP is stable. No acute changes noted. Still has her aide come in 5 days a week. Has a good support system.  Discussed plans with patient for ongoing care management follow up and provided patient with direct contact information for care management team Advised patient to call the office for changes in conditions, questions or concerns; Provided education to patient re: staying active, fall prevention and safety, nutritional status and improving appetite. 04-29-2022: Denies any new concerns with safety ; Reviewed medications with patient and discussed compliance. 04-29-2022: The patient is compliant with medications; Reviewed scheduled/upcoming provider appointments including saw pcp in April and will follow up as needed. Saw urgent care 04-04-2022 due to upper respiratory infection- resolved now; Discussed plans with patient for ongoing care management follow up and provided patient with direct contact information for care management team; Screening for signs and symptoms of depression related to chronic disease state;  Assessed social determinant of health barriers;     Depression and Anxiety   (Status: Goal on Track (progressing): YES.) Long Term Goal  Evaluation of current treatment plan related to Anxiety and Depression, Mental Health Concerns  self-management and  patient's adherence to plan as established by provider. 11-26-2021: The patient feels she is doing well. Denies any new needs or concerns with depression and anxiety. Says that she does have bad days sometimes but she works through those times. She denies any acute factors, needs, or questions, related to mental health. 02-25-2022: The patient states she is doing much bette since her hospital stay in March and the bowel infection she had. She states she was doubled over in pain and she had never experienced anything like it before. She states that she was in the hospital from 01-30-2022 to 02-03-2022. She states she is happy to be at home and has done well. She denies any acute findings today. Has seen urologist for renal mass and will have a follow up for this in 6 months. 04-29-2022: The patient states she is doing well and denies any acute findings related to depression, anxiety, or mental health. States she is happy that her URI has resolved.  Discussed plans with patient for ongoing care management follow up and provided patient with direct contact information for care management team Advised patient to call the office for changes in mood, anxiety, and depression ; Provided education to patient re: doing things she enjoys, being around positive people, finding a hobby that works for her, safety in the home; Reviewed medications with patient and discussed compliance. 04-29-2022: Is compliant with medications ; Reviewed scheduled/upcoming provider appointments including saw pcp in April. Knows to call for changes; Discussed plans with patient for ongoing care management follow up and provided patient with direct contact information for care management team; Screening for signs and symptoms of depression related to chronic disease state;  Hyperlipidemia:  (Status: Goal on Track (progressing): YES.)      Lab Results  Component Value Date    CHOL 162 12/24/2021    HDL 48 12/24/2021    LDLCALC 85  12/24/2021    TRIG 172 (H) 12/24/2021    CHOLHDL 3.4 12/24/2021      Medication review performed; medication list updated in electronic medical record. 04-29-2022: The patient takes Lipitor 40 mg daily without difficulty. The patient denies any medication needs at this time Provider established cholesterol goals reviewed; Counseled on importance of regular laboratory monitoring as prescribed. 04-29-2022: Review of needed laboratory monitoring for effective management of HLD Provided HLD educational materials; Reviewed role and benefits of statin for ASCVD risk reduction; Discussed strategies to manage statin-induced myalgias; Reviewed importance of limiting foods high in cholesterol. 04-29-2022: The patient denies any issues with dietary restrictions. She does not particularly watch what she eats. Education on the benefits of heart healthy diet   Hypertension: (Status: Goal on Track (progressing): YES.) Last practice recorded BP readings:     BP Readings from Last 3 Encounters:  03/05/22 139/74  02/11/22 (!) 150/78  02/05/22 114/66  Most recent eGFR/CrCl:       Lab Results  Component Value Date    EGFR 101 02/05/2022    No components found for: CRCL   Evaluation of current treatment plan related to hypertension self management and patient's adherence to plan as established by provider. 04-29-2022: The patient checks her blood pressures periodically. The patient denies any issues with HTN or heart health;   Provided education to patient re: stroke prevention, s/s of heart attack and stroke; Reviewed prescribed diet heart healthy Reviewed medications with patient and discussed importance of compliance. 04-29-2022: The patient states compliance with medications.  Discussed plans with patient for ongoing care management follow up and provided patient with direct contact information for care management team; Advised patient, providing education and rationale, to monitor blood pressure daily and  record, calling PCP for findings outside established parameters;  Provided education on prescribed diet heart healthy;  Discussed complications of poorly controlled blood pressure such as heart disease, stroke, circulatory complications, vision complications, kidney impairment, sexual dysfunction;      Renal mass  (Status: Goal on Track (progressing): YES.) Long Term Goal  Evaluation of current treatment plan related to  Renal Mass ,  self-management and patient's adherence to plan as established by provider. 04-29-2022: The patient denies any issues with renal mass, no new changes. Will follow up in a couple months for evaluation and status changes of mass. Follows specialist. Discussed plans with patient for ongoing care management follow up and provided patient with direct contact information for care management team Advised patient to call the office for changes in urinary healthy or new concerns related to renal mass; Provided education to patient re: monitoring for sx and sx of infection, questions or concerns; Reviewed medications with patient and discussed compliance ; Reviewed scheduled/upcoming provider appointments including Saw the pcp on 03-05-2022. Sees specialist for follow up in a couple of months.; Discussed plans with patient for ongoing care management follow up and provided patient with direct contact information for care management team; Advised patient to discuss new concerns with with provider; Screening for signs and symptoms of depression related to chronic disease state;  Assessed social determinant of health barriers;      Our next appointment is by telephone on 07-29-2022 at 1 pm  Please call the care guide team at [phone] if you  need to cancel or reschedule your appointment.   If you are experiencing a Mental Health or Day or need someone to talk to, please call the Suicide and Crisis Lifeline: 988 call the [place] National Suicide Prevention  Lifeline: 854-727-7514 or TTY: 254-682-4027 TTY (831) 043-8849) to talk to a trained counselor call 1-800-273-TALK (toll free, 24 hour hotline)   The patient verbalized understanding of instructions, educational materials, and care plan provided today and DECLINED offer to receive copy of patient instructions, educational materials, and care plan.   Noreene Larsson RN, MSN, [facility] Mobile: [phone]

## 2022-04-29 NOTE — Chronic Care Management (AMB) (Signed)
Chronic Care Management   CCM RN Visit Note  04/29/2022 Name: Michelle Chandler MRN: [id number] DOB: [birth year]  Subjective: Michelle Chandler is a 66 y.o. year old female who is a primary care patient of Vigg, Avanti, MD. The care management team was consulted for assistance with disease management and care coordination needs.    Engaged with patient by telephone for follow up visit in response to provider referral for case management and/or care coordination services.   Consent to Services:  The patient was given information about Chronic Care Management services, agreed to services, and gave verbal consent prior to initiation of services.  Please see initial visit note for detailed documentation.   Patient agreed to services and verbal consent obtained.   Assessment: Review of patient past medical history, allergies, medications, health status, including review of consultants reports, laboratory and other test data, was performed as part of comprehensive evaluation and provision of chronic care management services.   SDOH (Social Determinants of Health) assessments and interventions performed:    CCM Care Plan  No Known Allergies  Outpatient Encounter Medications as of 04/29/2022  Medication Sig   atorvastatin (LIPITOR) 40 MG tablet Take 1 tablet (40 mg total) by mouth daily.   baclofen (LIORESAL) 10 MG tablet Take 1 tablet (10 mg total) by mouthevery evening.   diphenhydrAMINE (BENADRYL) 25 MG tablet Take 25 mg by mouth every 6 (six) hours as needed for itching or allergies.   FLUoxetine (PROZAC) 20 MG capsule Take 1 capsule (20 mg total) by mouth daily.   hydrochlorothiazide (HYDRODIURIL) 25 MG tablet Take 1 tablet (25 mg total) by mouth daily.   ibandronate (BONIVA) 150 MG tablet Take 1 tablet (150 mg total) by mouth every 30 (thirty) days. Take in the morning with a full glass of water, on an empty stomach, and do not take anything else by mouth or lie down for the next 30 min.    Incontinence Supply Disposable (ENTRUST PLUS DISP UNDERPADS) MISC by Does not apply route.   Incontinence Supply Disposable (PROTECTIVE UNDERWEAR LARGE) MISC by Does not apply route.   loperamide (IMODIUM) 2 MG capsule Take 1 capsule (2 mg total) by mouth every 8 (eight) hours as needed for diarrhea or loose stools.   pantoprazole (PROTONIX) 40 MG tablet Take 1 tablet (40 mg total) by mouth daily.   No facility-administered encounter medications on file as of 04/29/2022.    Patient Active Problem List   Diagnosis Date Noted   Diabetes mellitus without complication ([place]) 05/09/7627   Swelling 02/05/2022   Hypomagnesemia 02/01/2022   Hypokalemia 01/31/2022   Hyponatremia 01/31/2022   Acute respiratory failure with hypoxia ([place]) 01/31/2022   Diverticulitis 01/31/2022   Sepsis due to Escherichia coli (E. coli) ([place]) 01/31/2022   Acute cystitis with hematuria 01/31/2022   Renal mass 01/31/2022   E. coli sepsis ([place]) 01/31/2022   Abdominal pain 01/30/2022   Need for influenza vaccination 09/16/2021   Anxiety 06/12/2021   Osteoporosis 06/12/2021   Prediabetes 10/07/2020   Gastroesophageal reflux disease 06/10/2020   Smoking greater than 40 pack years 06/10/2020   Cerebral palsy ([place]) 12/27/2015   Hyperlipidemia 12/27/2015   Essential (primary) hypertension 12/27/2015   Depression, recurrent ([place]) 11/20/2015    Conditions to be addressed/monitored:HTN, HLD, Anxiety, Depression, and CP and renal mass  Care Plan : RNCM: General Plan of Care (Adult) for Chronic Disease Management and Care Coordination Needs  Updates made by Vanita Ingles, RN since 04/29/2022 12:00 AM  Problem: RNCM: Development of plan of care for Chronic Disease Management (HTN, HLD, Depression, Anxiety, CP)   Priority: High     Long-Range Goal: RNCM: Effective Management  of plan of care for Chronic Disease Management (HTN, HLD, Depression, Anxiety, CP)   Start Date: 09/17/2021  Expected End Date: 09/17/2022   Priority: High  Note:                                                                                                                                                                                                                      Current Barriers:  Knowledge Deficits related to plan of care for management of HTN, HLD, Anxiety with Excessive Worry, Social Anxiety, and Depression: depressed mood anxiety, and CP  Chronic Disease Management support and education needs related to HTN, HLD, Anxiety with Excessive Worry, Social Anxiety, and Depression: depressed mood anxiety, and Cerebral Palsy (CP)  RNCM Clinical Goal(s):  Patient will verbalize understanding of plan for management of HTN, HLD, Anxiety, Depression, and CP as evidenced by compliance with the plan of care, taking medications as directed, working with the CCM team to optimize health and well being  take all medications exactly as prescribed and will call provider for medication related questions as evidenced by compliance with medications, calling for refills before running out of medications, calling the office for changes in condition    demonstrate improved and ongoing adherence to prescribed treatment plan for HTN, HLD, Anxiety, Depression, and CP as evidenced by keeping appointments and calling the office for changes in conditions or changes  demonstrate a decrease in HTN, HLD, Anxiety, Depression, and CP exacerbations  as evidenced by compliance with the plan of care, compliance with medications, compliance with dietary restrictions demonstrate ongoing self health care management ability for effective management of Chronic conditions  as evidenced by  working with the CCM team   through collaboration with Consulting civil engineer, provider, and care team.   Interventions: 1:1 collaboration with primary care provider regarding development and update of comprehensive plan of care as evidenced by provider attestation and  co-signature Inter-disciplinary care team collaboration (see longitudinal plan of care) Evaluation of current treatment plan related to  self management and patient's adherence to plan as established by provider   SDOH Barriers (Status: Goal on Track (progressing): YES.) Long Term Goal  Patient interviewed and SDOH assessment performed        SDOH Interventions    Flowsheet Row Most Recent Value  SDOH Interventions  Food Insecurity Interventions Intervention Not Indicated  Financial Strain Interventions Intervention Not Indicated  Housing Interventions Intervention Not Indicated  Intimate Partner Violence Interventions Intervention Not Indicated  Physical Activity Interventions Other (Comments)  [limited ability to do activity due to chronic conditions and CP]  Social Connections Interventions Intervention Not Indicated, Other (Comment)  [has good support system]  Transportation Interventions Intervention Not Indicated     Patient interviewed and appropriate assessments performed Provided patient with information about resources for [facility] and care guides available to assist with new concerns or needs  Discussed plans with patient for ongoing care management follow up and provided patient with direct contact information for care management team Advised patient to call the office for changes in SDOH, changes of concerns   Cerebral Palsy (CP)  (Status: Goal on Track (progressing): YES.) Long Term Goal  Evaluation of current treatment plan related to  CP ,  self-management and patient's adherence to plan as established by provider. 11-26-2021: The patient has an aide that comes in 5 days a week to assist with bathing and general ADLS/IADLS. She feels like her CP is stable and denies any new concerns related to CP. 04-29-2022: Her CP is stable. No acute changes noted. Still has her aide come in 5 days a week. Has a good support system.  Discussed plans with patient for ongoing care  management follow up and provided patient with direct contact information for care management team Advised patient to call the office for changes in conditions, questions or concerns; Provided education to patient re: staying active, fall prevention and safety, nutritional status and improving appetite. 04-29-2022: Denies any new concerns with safety ; Reviewed medications with patient and discussed compliance. 04-29-2022: The patient is compliant with medications; Reviewed scheduled/upcoming provider appointments including saw pcp in April and will follow up as needed. Saw urgent care 04-04-2022 due to upper respiratory infection- resolved now; Discussed plans with patient for ongoing care management follow up and provided patient with direct contact information for care management team; Screening for signs and symptoms of depression related to chronic disease state;  Assessed social determinant of health barriers;    Depression and Anxiety   (Status: Goal on Track (progressing): YES.) Long Term Goal  Evaluation of current treatment plan related to Anxiety and Depression, Mental Health Concerns  self-management and patient's adherence to plan as established by provider. 11-26-2021: The patient feels she is doing well. Denies any new needs or concerns with depression and anxiety. Says that she does have bad days sometimes but she works through those times. She denies any acute factors, needs, or questions, related to mental health. 02-25-2022: The patient states she is doing much bette since her hospital stay in March and the bowel infection she had. She states she was doubled over in pain and she had never experienced anything like it before. She states that she was in the hospital from 01-30-2022 to 02-03-2022. She states she is happy to be at home and has done well. She denies any acute findings today. Has seen urologist for renal mass and will have a follow up for this in 6 months. 04-29-2022: The patient  states she is doing well and denies any acute findings related to depression, anxiety, or mental health. States she is happy that her URI has resolved.  Discussed plans with patient for ongoing care management follow up and provided patient with direct contact information for care management team Advised patient to call the office for changes in mood,  anxiety, and depression ; Provided education to patient re: doing things she enjoys, being around positive people, finding a hobby that works for her, safety in the home; Reviewed medications with patient and discussed compliance. 04-29-2022: Is compliant with medications ; Reviewed scheduled/upcoming provider appointments including saw pcp in April. Knows to call for changes; Discussed plans with patient for ongoing care management follow up and provided patient with direct contact information for care management team; Screening for signs and symptoms of depression related to chronic disease state;   Hyperlipidemia:  (Status: Goal on Track (progressing): YES.) Lab Results  Component Value Date   CHOL 162 12/24/2021   HDL 48 12/24/2021   LDLCALC 85 12/24/2021   TRIG 172 (H) 12/24/2021   CHOLHDL 3.4 12/24/2021     Medication review performed; medication list updated in electronic medical record. 04-29-2022: The patient takes Lipitor 40 mg daily without difficulty. The patient denies any medication needs at this time Provider established cholesterol goals reviewed; Counseled on importance of regular laboratory monitoring as prescribed. 04-29-2022: Review of needed laboratory monitoring for effective management of HLD Provided HLD educational materials; Reviewed role and benefits of statin for ASCVD risk reduction; Discussed strategies to manage statin-induced myalgias; Reviewed importance of limiting foods high in cholesterol. 04-29-2022: The patient denies any issues with dietary restrictions. She does not particularly watch what she eats. Education  on the benefits of heart healthy diet  Hypertension: (Status: Goal on Track (progressing): YES.) Last practice recorded BP readings:  BP Readings from Last 3 Encounters:  03/05/22 139/74  02/11/22 (!) 150/78  02/05/22 114/66  Most recent eGFR/CrCl:  Lab Results  Component Value Date   EGFR 101 02/05/2022    No components found for: CRCL  Evaluation of current treatment plan related to hypertension self management and patient's adherence to plan as established by provider. 04-29-2022: The patient checks her blood pressures periodically. The patient denies any issues with HTN or heart health;   Provided education to patient re: stroke prevention, s/s of heart attack and stroke; Reviewed prescribed diet heart healthy Reviewed medications with patient and discussed importance of compliance. 04-29-2022: The patient states compliance with medications.  Discussed plans with patient for ongoing care management follow up and provided patient with direct contact information for care management team; Advised patient, providing education and rationale, to monitor blood pressure daily and record, calling PCP for findings outside established parameters;  Provided education on prescribed diet heart healthy;  Discussed complications of poorly controlled blood pressure such as heart disease, stroke, circulatory complications, vision complications, kidney impairment, sexual dysfunction;    Renal mass  (Status: Goal on Track (progressing): YES.) Long Term Goal  Evaluation of current treatment plan related to  Renal Mass ,  self-management and patient's adherence to plan as established by provider. 04-29-2022: The patient denies any issues with renal mass, no new changes. Will follow up in a couple months for evaluation and status changes of mass. Follows specialist. Discussed plans with patient for ongoing care management follow up and provided patient with direct contact information for care management  team Advised patient to call the office for changes in urinary healthy or new concerns related to renal mass; Provided education to patient re: monitoring for sx and sx of infection, questions or concerns; Reviewed medications with patient and discussed compliance ; Reviewed scheduled/upcoming provider appointments including Saw the pcp on 03-05-2022. Sees specialist for follow up in a couple of months.; Discussed plans with patient for ongoing care management follow up  and provided patient with direct contact information for care management team; Advised patient to discuss new concerns with with provider; Screening for signs and symptoms of depression related to chronic disease state;  Assessed social determinant of health barriers;    Patient Goals/Self-Care Activities: Patient will self administer medications as prescribed as evidenced by self report/primary caregiver report  Patient will attend all scheduled provider appointments as evidenced by clinician review of documented attendance to scheduled appointments and patient/caregiver report Patient will call pharmacy for medication refills as evidenced by patient report and review of pharmacy fill history as appropriate Patient will attend church or other social activities as evidenced by patient report Patient will call provider office for new concerns or questions as evidenced by review of documented incoming telephone call notes and patient report Patient will work with BSW to address care coordination needs and will continue to work with the clinical team to address health care and disease management related needs as evidenced by documented adherence to scheduled care management/care coordination appointments - check blood pressure 3 times per week - choose a place to take my blood pressure (home, clinic or office, retail store) - write blood pressure results in a log or diary - learn about high blood pressure - keep a blood pressure  log - take blood pressure log to all doctor appointments - call doctor for signs and symptoms of high blood pressure - develop an action plan for high blood pressure - keep all doctor appointments - take medications for blood pressure exactly as prescribed - report new symptoms to your doctor - eat more whole grains, fruits and vegetables, lean meats and healthy fats - call for medicine refill 2 or 3 days before it runs out - take all medications exactly as prescribed - call doctor with any symptoms you believe are related to your medicine - call doctor when you experience any new symptoms - go to all doctor appointments as scheduled - adhere to prescribed diet: heart healthy        Plan:Telephone follow up appointment with care management team member scheduled for:  07-29-2022 at 1 pm  Medicine Bow, MSN, [facility] Mobile: [phone]

## 2022-05-04 DIAGNOSIS — M7550 Bursitis of unspecified shoulder: Secondary | ICD-10-CM | POA: Diagnosis not present

## 2022-05-04 DIAGNOSIS — K5792 Diverticulitis of intestine, part unspecified, without perforation or abscess without bleeding: Secondary | ICD-10-CM | POA: Diagnosis not present

## 2022-05-04 DIAGNOSIS — R7301 Impaired fasting glucose: Secondary | ICD-10-CM | POA: Diagnosis not present

## 2022-05-04 DIAGNOSIS — G809 Cerebral palsy, unspecified: Secondary | ICD-10-CM | POA: Diagnosis not present

## 2022-05-04 DIAGNOSIS — E785 Hyperlipidemia, unspecified: Secondary | ICD-10-CM | POA: Diagnosis not present

## 2022-05-04 DIAGNOSIS — I10 Essential (primary) hypertension: Secondary | ICD-10-CM | POA: Diagnosis not present

## 2022-05-05 DIAGNOSIS — R42 Dizziness and giddiness: Secondary | ICD-10-CM | POA: Diagnosis not present

## 2022-05-05 DIAGNOSIS — F32A Depression, unspecified: Secondary | ICD-10-CM | POA: Diagnosis not present

## 2022-05-05 DIAGNOSIS — J309 Allergic rhinitis, unspecified: Secondary | ICD-10-CM | POA: Diagnosis not present

## 2022-05-05 DIAGNOSIS — G809 Cerebral palsy, unspecified: Secondary | ICD-10-CM | POA: Diagnosis not present

## 2022-05-05 DIAGNOSIS — E785 Hyperlipidemia, unspecified: Secondary | ICD-10-CM | POA: Diagnosis not present

## 2022-05-05 DIAGNOSIS — F17210 Nicotine dependence, cigarettes, uncomplicated: Secondary | ICD-10-CM | POA: Diagnosis not present

## 2022-05-05 DIAGNOSIS — R270 Ataxia, unspecified: Secondary | ICD-10-CM | POA: Diagnosis not present

## 2022-05-05 DIAGNOSIS — K5792 Diverticulitis of intestine, part unspecified, without perforation or abscess without bleeding: Secondary | ICD-10-CM | POA: Diagnosis not present

## 2022-05-05 DIAGNOSIS — N2889 Other specified disorders of kidney and ureter: Secondary | ICD-10-CM | POA: Diagnosis not present

## 2022-05-05 DIAGNOSIS — M7550 Bursitis of unspecified shoulder: Secondary | ICD-10-CM | POA: Diagnosis not present

## 2022-05-05 DIAGNOSIS — Z9181 History of falling: Secondary | ICD-10-CM | POA: Diagnosis not present

## 2022-05-05 DIAGNOSIS — Z87440 Personal history of urinary (tract) infections: Secondary | ICD-10-CM | POA: Diagnosis not present

## 2022-05-05 DIAGNOSIS — I10 Essential (primary) hypertension: Secondary | ICD-10-CM | POA: Diagnosis not present

## 2022-05-05 DIAGNOSIS — Z791 Long term (current) use of non-steroidal anti-inflammatories (NSAID): Secondary | ICD-10-CM | POA: Diagnosis not present

## 2022-05-05 DIAGNOSIS — Z7983 Long term (current) use of bisphosphonates: Secondary | ICD-10-CM | POA: Diagnosis not present

## 2022-05-05 DIAGNOSIS — I872 Venous insufficiency (chronic) (peripheral): Secondary | ICD-10-CM | POA: Diagnosis not present

## 2022-05-05 DIAGNOSIS — R7301 Impaired fasting glucose: Secondary | ICD-10-CM | POA: Diagnosis not present

## 2022-05-11 DIAGNOSIS — M7550 Bursitis of unspecified shoulder: Secondary | ICD-10-CM | POA: Diagnosis not present

## 2022-05-11 DIAGNOSIS — K5792 Diverticulitis of intestine, part unspecified, without perforation or abscess without bleeding: Secondary | ICD-10-CM | POA: Diagnosis not present

## 2022-05-11 DIAGNOSIS — I10 Essential (primary) hypertension: Secondary | ICD-10-CM | POA: Diagnosis not present

## 2022-05-11 DIAGNOSIS — R7301 Impaired fasting glucose: Secondary | ICD-10-CM | POA: Diagnosis not present

## 2022-05-11 DIAGNOSIS — G809 Cerebral palsy, unspecified: Secondary | ICD-10-CM | POA: Diagnosis not present

## 2022-05-11 DIAGNOSIS — E785 Hyperlipidemia, unspecified: Secondary | ICD-10-CM | POA: Diagnosis not present

## 2022-05-12 ENCOUNTER — Other Ambulatory Visit: Payer: Self-pay | Admitting: Internal Medicine

## 2022-05-12 DIAGNOSIS — E7800 Pure hypercholesterolemia, unspecified: Secondary | ICD-10-CM

## 2022-05-12 DIAGNOSIS — G801 Spastic diplegic cerebral palsy: Secondary | ICD-10-CM

## 2022-05-12 DIAGNOSIS — M810 Age-related osteoporosis without current pathological fracture: Secondary | ICD-10-CM

## 2022-05-12 DIAGNOSIS — E119 Type 2 diabetes mellitus without complications: Secondary | ICD-10-CM

## 2022-05-15 DIAGNOSIS — F32A Depression, unspecified: Secondary | ICD-10-CM

## 2022-05-15 DIAGNOSIS — I10 Essential (primary) hypertension: Secondary | ICD-10-CM

## 2022-05-15 DIAGNOSIS — E785 Hyperlipidemia, unspecified: Secondary | ICD-10-CM

## 2022-05-20 DIAGNOSIS — M7550 Bursitis of unspecified shoulder: Secondary | ICD-10-CM | POA: Diagnosis not present

## 2022-05-20 DIAGNOSIS — G809 Cerebral palsy, unspecified: Secondary | ICD-10-CM | POA: Diagnosis not present

## 2022-05-20 DIAGNOSIS — E785 Hyperlipidemia, unspecified: Secondary | ICD-10-CM | POA: Diagnosis not present

## 2022-05-20 DIAGNOSIS — I10 Essential (primary) hypertension: Secondary | ICD-10-CM | POA: Diagnosis not present

## 2022-05-20 DIAGNOSIS — R7301 Impaired fasting glucose: Secondary | ICD-10-CM | POA: Diagnosis not present

## 2022-05-20 DIAGNOSIS — K5792 Diverticulitis of intestine, part unspecified, without perforation or abscess without bleeding: Secondary | ICD-10-CM | POA: Diagnosis not present

## 2022-05-25 DIAGNOSIS — K5792 Diverticulitis of intestine, part unspecified, without perforation or abscess without bleeding: Secondary | ICD-10-CM | POA: Diagnosis not present

## 2022-05-25 DIAGNOSIS — G809 Cerebral palsy, unspecified: Secondary | ICD-10-CM | POA: Diagnosis not present

## 2022-05-25 DIAGNOSIS — E785 Hyperlipidemia, unspecified: Secondary | ICD-10-CM | POA: Diagnosis not present

## 2022-05-25 DIAGNOSIS — I10 Essential (primary) hypertension: Secondary | ICD-10-CM | POA: Diagnosis not present

## 2022-05-25 DIAGNOSIS — M7550 Bursitis of unspecified shoulder: Secondary | ICD-10-CM | POA: Diagnosis not present

## 2022-05-25 DIAGNOSIS — R7301 Impaired fasting glucose: Secondary | ICD-10-CM | POA: Diagnosis not present

## 2022-06-01 ENCOUNTER — Telehealth: Payer: Self-pay

## 2022-06-01 ENCOUNTER — Telehealth: Payer: Medicare Other

## 2022-06-01 DIAGNOSIS — I10 Essential (primary) hypertension: Secondary | ICD-10-CM | POA: Diagnosis not present

## 2022-06-01 DIAGNOSIS — G809 Cerebral palsy, unspecified: Secondary | ICD-10-CM | POA: Diagnosis not present

## 2022-06-01 DIAGNOSIS — E785 Hyperlipidemia, unspecified: Secondary | ICD-10-CM | POA: Diagnosis not present

## 2022-06-01 DIAGNOSIS — K5792 Diverticulitis of intestine, part unspecified, without perforation or abscess without bleeding: Secondary | ICD-10-CM | POA: Diagnosis not present

## 2022-06-01 DIAGNOSIS — R7301 Impaired fasting glucose: Secondary | ICD-10-CM | POA: Diagnosis not present

## 2022-06-01 DIAGNOSIS — M7550 Bursitis of unspecified shoulder: Secondary | ICD-10-CM | POA: Diagnosis not present

## 2022-06-01 NOTE — Telephone Encounter (Signed)
  Care Management   Follow Up Note   06/01/2022 Name: Rozalynn Buege MRN: [id number] DOB: 10/26/1956   Referred by: Charlynne Cousins, MD Reason for referral : Chronic Care Management   An unsuccessful telephone outreach was attempted today. The patient was referred to the case management team for assistance with care management and care coordination.   Follow Up Plan: The patient has been provided with contact information for the care management team and has been advised to call with any health related questions or concerns.  -Patient had PT visiting during visit. Will have Concierge call and reschedule  [place] Constable, Pharm.D. - [phone]

## 2022-06-03 ENCOUNTER — Telehealth: Payer: Self-pay

## 2022-06-03 NOTE — Telephone Encounter (Signed)
Copied from [place] [phone]. Topic: General - Other >> Jun 03, 2022  3:47 PM Caryn H wrote: Reason for CRM:  Cindy with [facility] requesting Verbal orders for maintenance PT, 1x week 8 weeks Call back [phone]   Called and LVM with Cindy giving verbal orders for the patient.

## 2022-06-04 DIAGNOSIS — I10 Essential (primary) hypertension: Secondary | ICD-10-CM | POA: Diagnosis not present

## 2022-06-04 DIAGNOSIS — F32A Depression, unspecified: Secondary | ICD-10-CM | POA: Diagnosis not present

## 2022-06-04 DIAGNOSIS — R270 Ataxia, unspecified: Secondary | ICD-10-CM | POA: Diagnosis not present

## 2022-06-04 DIAGNOSIS — K5792 Diverticulitis of intestine, part unspecified, without perforation or abscess without bleeding: Secondary | ICD-10-CM | POA: Diagnosis not present

## 2022-06-04 DIAGNOSIS — N2889 Other specified disorders of kidney and ureter: Secondary | ICD-10-CM | POA: Diagnosis not present

## 2022-06-04 DIAGNOSIS — Z9181 History of falling: Secondary | ICD-10-CM | POA: Diagnosis not present

## 2022-06-04 DIAGNOSIS — R42 Dizziness and giddiness: Secondary | ICD-10-CM | POA: Diagnosis not present

## 2022-06-04 DIAGNOSIS — F17210 Nicotine dependence, cigarettes, uncomplicated: Secondary | ICD-10-CM | POA: Diagnosis not present

## 2022-06-04 DIAGNOSIS — Z87440 Personal history of urinary (tract) infections: Secondary | ICD-10-CM | POA: Diagnosis not present

## 2022-06-04 DIAGNOSIS — Z7983 Long term (current) use of bisphosphonates: Secondary | ICD-10-CM | POA: Diagnosis not present

## 2022-06-04 DIAGNOSIS — G809 Cerebral palsy, unspecified: Secondary | ICD-10-CM | POA: Diagnosis not present

## 2022-06-04 DIAGNOSIS — I872 Venous insufficiency (chronic) (peripheral): Secondary | ICD-10-CM | POA: Diagnosis not present

## 2022-06-04 DIAGNOSIS — R7301 Impaired fasting glucose: Secondary | ICD-10-CM | POA: Diagnosis not present

## 2022-06-04 DIAGNOSIS — M7550 Bursitis of unspecified shoulder: Secondary | ICD-10-CM | POA: Diagnosis not present

## 2022-06-04 DIAGNOSIS — J309 Allergic rhinitis, unspecified: Secondary | ICD-10-CM | POA: Diagnosis not present

## 2022-06-04 DIAGNOSIS — Z791 Long term (current) use of non-steroidal anti-inflammatories (NSAID): Secondary | ICD-10-CM | POA: Diagnosis not present

## 2022-06-04 DIAGNOSIS — E785 Hyperlipidemia, unspecified: Secondary | ICD-10-CM | POA: Diagnosis not present

## 2022-06-09 DIAGNOSIS — R7301 Impaired fasting glucose: Secondary | ICD-10-CM | POA: Diagnosis not present

## 2022-06-09 DIAGNOSIS — E785 Hyperlipidemia, unspecified: Secondary | ICD-10-CM | POA: Diagnosis not present

## 2022-06-09 DIAGNOSIS — G809 Cerebral palsy, unspecified: Secondary | ICD-10-CM | POA: Diagnosis not present

## 2022-06-09 DIAGNOSIS — K5792 Diverticulitis of intestine, part unspecified, without perforation or abscess without bleeding: Secondary | ICD-10-CM | POA: Diagnosis not present

## 2022-06-09 DIAGNOSIS — I10 Essential (primary) hypertension: Secondary | ICD-10-CM | POA: Diagnosis not present

## 2022-06-09 DIAGNOSIS — M7550 Bursitis of unspecified shoulder: Secondary | ICD-10-CM | POA: Diagnosis not present

## 2022-06-11 ENCOUNTER — Telehealth: Payer: Self-pay

## 2022-06-11 NOTE — Progress Notes (Signed)
I have called the patient to reschedule her appointment that was missed with the clinical pharmacist on 06/02/22. Her appointment has been rescheduled for 08/24/22 @ 11 am with [place] Constable the clinical pharmacist.   Michelle Chandler, [place]

## 2022-06-16 ENCOUNTER — Other Ambulatory Visit: Payer: Self-pay

## 2022-06-16 MED ORDER — ATORVASTATIN CALCIUM 40 MG PO TABS
40.0000 mg | ORAL_TABLET | Freq: Every day | ORAL | 0 refills | Status: DC
Start: 2022-06-16 — End: 2022-09-15

## 2022-06-16 MED ORDER — FLUOXETINE HCL 20 MG PO CAPS: 20.0000 mg | ORAL_CAPSULE | Freq: Every day | ORAL | 0 refills | Status: DC

## 2022-06-16 NOTE — Telephone Encounter (Signed)
LOV 03/05/22  No future appt noted,   Patient was also asking for refills of Ibuprofen 400 mg (not on Med list) and Hydroxyzine PAM 25 mg(Not on Med list)

## 2022-06-17 NOTE — Telephone Encounter (Signed)
PT scheduled 8/11

## 2022-06-23 DIAGNOSIS — I10 Essential (primary) hypertension: Secondary | ICD-10-CM | POA: Diagnosis not present

## 2022-06-23 DIAGNOSIS — R7301 Impaired fasting glucose: Secondary | ICD-10-CM | POA: Diagnosis not present

## 2022-06-23 DIAGNOSIS — E785 Hyperlipidemia, unspecified: Secondary | ICD-10-CM | POA: Diagnosis not present

## 2022-06-23 DIAGNOSIS — K5792 Diverticulitis of intestine, part unspecified, without perforation or abscess without bleeding: Secondary | ICD-10-CM | POA: Diagnosis not present

## 2022-06-23 DIAGNOSIS — M7550 Bursitis of unspecified shoulder: Secondary | ICD-10-CM | POA: Diagnosis not present

## 2022-06-23 DIAGNOSIS — G809 Cerebral palsy, unspecified: Secondary | ICD-10-CM | POA: Diagnosis not present

## 2022-06-26 ENCOUNTER — Ambulatory Visit: Payer: Medicare Other | Admitting: Physician Assistant

## 2022-06-30 ENCOUNTER — Encounter: Payer: Self-pay | Admitting: Physician Assistant

## 2022-06-30 ENCOUNTER — Ambulatory Visit (INDEPENDENT_AMBULATORY_CARE_PROVIDER_SITE_OTHER): Payer: Medicare Other | Admitting: Physician Assistant

## 2022-06-30 VITALS — BP 126/80 | HR 68 | Temp 98.8°F | Ht 60.0 in | Wt 149.6 lb

## 2022-06-30 DIAGNOSIS — G809 Cerebral palsy, unspecified: Secondary | ICD-10-CM | POA: Diagnosis not present

## 2022-06-30 DIAGNOSIS — I10 Essential (primary) hypertension: Secondary | ICD-10-CM | POA: Diagnosis not present

## 2022-06-30 DIAGNOSIS — E7800 Pure hypercholesterolemia, unspecified: Secondary | ICD-10-CM

## 2022-06-30 MED ORDER — IBUPROFEN 600 MG PO TABS: 600.0000 mg | ORAL_TABLET | Freq: Three times a day (TID) | ORAL | 0 refills | Status: DC | PRN

## 2022-06-30 NOTE — Assessment & Plan Note (Signed)
Chronic, historic condition Currently taking HCTZ 25 mg PO QD- reports muscle cramping, may be secondary to medication in conjunction with statin If this continues may need to change to ACEi/ARB for control BP appears to be in goal at this time Continue current medications  Follow up in 3 months for labs and monitoring

## 2022-06-30 NOTE — Assessment & Plan Note (Signed)
Chronic, historic condition  Currently taking Atorvastatin 40 mg PO QD. She reports muscle spasms and cramps typically at night- may need to make adjustments to HTN/HLD regimen to assist with this  Will recheck lipid panel in 3 months  Continue current medications for now Follow up in 3 months

## 2022-06-30 NOTE — Progress Notes (Signed)
Established Patient Office Visit  Name: Michelle Chandler   MRN: [id number]    DOB: 11-Oct-1956   Date:06/30/2022  Today's Provider: Talitha Givens, MHS, PA-C Introduced myself to the patient as a PA-C and provided education on APPs in clinical practice.         Subjective  Chief Complaint  Chief Complaint  Patient presents with   Hypertension   Cerebral Palsy   Medication Refill    Medication refill for Ibuprofen has a past prescription for 400 mg but would like to have 500 mg sent in.    Hypertension Associated symptoms include malaise/fatigue. Pertinent negatives include no blurred vision, chest pain, headaches or palpitations.  Medication Refill Pertinent negatives include no chest pain or headaches.    Hypertension: - Medications: HCTZ 25 mg PO QD  - Compliance: excellent - Checking BP at home: Weekly check, states it seems to fluctuate  Reports some feelings of fatigue when it seems low  - Denies any SOB, CP, vision changes, LE edema, medication SEs, or symptoms of hypotension    CP  Taking Ibuprofen about once per week  States 400 mg tablets do not seem to fully reduce pain  States she is taking Baclofen at night and is still having muscle spasms and cramping  Reports it does make her drowsy  Feels like they have gotten worse recently  States they seem to happen more when she stretches out and lays down for bed     Patient Active Problem List   Diagnosis Date Noted   Diabetes mellitus without complication ([place]) 09/01/5101   Swelling 02/05/2022   Hypomagnesemia 02/01/2022   Hypokalemia 01/31/2022   Hyponatremia 01/31/2022   Acute respiratory failure with hypoxia ([place]) 01/31/2022   Diverticulitis 01/31/2022   Sepsis due to Escherichia coli (E. coli) ([place]) 01/31/2022   Acute cystitis with hematuria 01/31/2022   Renal mass 01/31/2022   E. coli sepsis ([place]) 01/31/2022   Abdominal pain 01/30/2022   Need for influenza vaccination 09/16/2021   Anxiety  06/12/2021   Osteoporosis 06/12/2021   Prediabetes 10/07/2020   Gastroesophageal reflux disease 06/10/2020   Smoking greater than 40 pack years 06/10/2020   Cerebral palsy ([place]) 12/27/2015   Hyperlipidemia 12/27/2015   Essential (primary) hypertension 12/27/2015   Depression, recurrent ([place]) 11/20/2015    Past Surgical History:  Procedure Laterality Date   ABDOMINAL HYSTERECTOMY     complete   LAPAROSCOPY     legs and hip     due to CP    Family History  Problem Relation Age of Onset   Diabetes Mother    Diabetes Father    Heart disease Maternal Uncle 74   Kidney disease Maternal Uncle     Social History   Tobacco Use   Smoking status: Every Day    Packs/day: 0.50    Types: Cigarettes   Smokeless tobacco: Never  Substance Use Topics   Alcohol use: No    Alcohol/week: 0.0 standard drinks of alcohol     Current Outpatient Medications:    atorvastatin (LIPITOR) 40 MG tablet, Take 1 tablet (40 mg total) by mouth daily., Disp: 90 tablet, Rfl: 0   baclofen (LIORESAL) 10 MG tablet, Take 1 tablet (10 mg total) by mouth every evening., Disp: 90 tablet, Rfl: 0   diphenhydrAMINE (BENADRYL) 25 MG tablet, Take 25 mg by mouth every 6 (six) hours as needed for itching or allergies., Disp: , Rfl:    FLUoxetine (PROZAC) 20  MG capsule, Take 1 capsule (20 mg total) by mouth daily., Disp: 90 capsule, Rfl: 0   hydrochlorothiazide (HYDRODIURIL) 25 MG tablet, Take 1 tablet (25 mg total) by mouth daily., Disp: 30 tablet, Rfl: 3   ibandronate (BONIVA) 150 MG tablet, Take 1 tablet (150 mg total) by mouth every 30 (thirty) days. Take in the morning with a full glass of water, on an empty stomach, and do not take anything else by mouth or lie down for the next 30 min., Disp: 12 tablet, Rfl: 1   Incontinence Supply Disposable (ENTRUST PLUS DISP UNDERPADS) MISC, by Does not apply route., Disp: , Rfl:    Incontinence Supply Disposable (PROTECTIVE UNDERWEAR LARGE) MISC, by Does not apply route.,  Disp: , Rfl:    loperamide (IMODIUM) 2 MG capsule, Take 1 capsule (2 mg total) by mouth every 8 (eight) hours as needed for diarrhea or loose stools., Disp: 10 capsule, Rfl: 0   diclofenac Sodium (VOLTAREN) 1 % GEL, , Disp: , Rfl:    ibuprofen (ADVIL) 600 MG tablet, Take 1 tablet (600 mg total) by mouth every 8 (eight) hours as needed for moderate pain or cramping., Disp: 90 tablet, Rfl: 0   LORazepam (ATIVAN) 1 MG tablet, , Disp: , Rfl:    mupirocin ointment (BACTROBAN) 2 %, , Disp: , Rfl:    pantoprazole (PROTONIX) 40 MG tablet, Take 1 tablet (40 mg total) by mouth daily., Disp: 30 tablet, Rfl: 0   VENTOLIN HFA 108 (90 Base) MCG/ACT inhaler, SMARTSIG:2 Puff(s) By Mouth Every 4 Hours PRN, Disp: , Rfl:   No Known Allergies  I personally reviewed active problem list, medication list, allergies, health maintenance, notes from last encounter, lab results with the patient/caregiver today.   Review of Systems  Constitutional:  Positive for malaise/fatigue.  Eyes:  Negative for blurred vision and double vision.  Cardiovascular:  Positive for leg swelling. Negative for chest pain and palpitations.  Neurological:  Negative for dizziness, loss of consciousness and headaches.      Objective  Vitals:   06/30/22 1122  BP: 126/80  Pulse: 68  Temp: 98.8 F (37.1 C)  TempSrc: Oral  Weight: 149 lb 9.6 oz (67.9 kg)  Height: 5' (1.524 m)    Body mass index is 29.22 kg/m.  Physical Exam Vitals reviewed.  Constitutional:      General: She is awake.     Appearance: Normal appearance. She is well-developed and overweight.  HENT:     Head: Normocephalic and atraumatic.     Mouth/Throat:     Mouth: Mucous membranes are moist.  Eyes:     Extraocular Movements: Extraocular movements intact.     Conjunctiva/sclera: Conjunctivae normal.  Cardiovascular:     Rate and Rhythm: Normal rate and regular rhythm.     Pulses: Normal pulses.     Heart sounds: Normal heart sounds. No murmur heard.     No friction rub. No gallop.  Pulmonary:     Effort: Pulmonary effort is normal. No respiratory distress.     Breath sounds: Normal breath sounds. No decreased air movement. No decreased breath sounds, wheezing, rhonchi or rales.  Musculoskeletal:     Cervical back: Normal range of motion.  Neurological:     Mental Status: She is alert and oriented to person, place, and time.     GCS: GCS eye subscore is 4. GCS verbal subscore is 5. GCS motor subscore is 6.     Cranial Nerves: Dysarthria present. No cranial nerve deficit or  facial asymmetry.     Motor: Abnormal muscle tone present.     Comments: Mild dysarthria  Abnormal tone noted in right leg   Psychiatric:        Attention and Perception: Attention and perception normal.        Mood and Affect: Mood and affect normal.        Behavior: Behavior is cooperative.        Thought Content: Thought content normal.        Cognition and Memory: Cognition normal.      No results found for this or any previous visit (from the past 2160 hour(s)).   PHQ2/9:    06/30/2022   11:43 AM 03/05/2022   11:13 AM 12/24/2021   11:35 AM 09/16/2021   11:31 AM 07/28/2021   11:18 AM  Depression screen PHQ 2/9  Decreased Interest '1 1 1 1 2  '$ Down, Depressed, Hopeless '1 1 1 2 '$ 0  PHQ - 2 Score '2 2 2 3 2  '$ Altered sleeping '1 1 1 '$ 0 0  Tired, decreased energy '1 1 1 1 '$ 0  Change in appetite 1 0 1 1 0  Feeling bad or failure about yourself  1 0 1 1 0  Trouble concentrating 1 0 0 0 1  Moving slowly or fidgety/restless 1 0 0 0 0  Suicidal thoughts 0 0 0 0 0  PHQ-9 Score '8 4 6 6 3  '$ Difficult doing work/chores Somewhat difficult Not difficult at all Somewhat difficult Not difficult at all Somewhat difficult      Fall Risk:    06/30/2022   11:42 AM 03/05/2022   11:08 AM 02/05/2022   10:33 AM 12/24/2021   11:35 AM 09/16/2021   11:31 AM  Fall Risk   Falls in the past year? 0 0 0 0 0  Number falls in past yr: 0 0 0 0 0  Injury with Fall? 0 0 0 0 0  Risk for fall  due to : Impaired balance/gait;Impaired mobility Impaired balance/gait;Impaired mobility Impaired mobility No Fall Risks Impaired balance/gait;Impaired mobility  Follow up  Falls evaluation completed Falls evaluation completed Falls evaluation completed Falls evaluation completed      Functional Status Survey:      Assessment & Plan  Problem List Items Addressed This Visit       Cardiovascular and Mediastinum   Essential (primary) hypertension    Chronic, historic condition Currently taking HCTZ 25 mg PO QD- reports muscle cramping, may be secondary to medication in conjunction with statin If this continues may need to change to ACEi/ARB for control BP appears to be in goal at this time Continue current medications  Follow up in 3 months for labs and monitoring         Nervous and Auditory   Cerebral palsy (HCC) - Primary    Chronic, historic condition, ongoing Reports she is going to physical therapy and walks as much as she is able at home Reports ongoing problems with pain and muscle stiffness Will increase Ibuprofen to 600 mg PO PRN for pain and recommend she continue Baclofen for muscle spasms at night. She may need to take a half tab in the day PRN for more severe spasms  Continue with PT as directed Follow up as needed       Relevant Medications   ibuprofen (ADVIL) 600 MG tablet     Other   Hyperlipidemia    Chronic, historic condition  Currently taking Atorvastatin 40 mg PO QD.  She reports muscle spasms and cramps typically at night- may need to make adjustments to HTN/HLD regimen to assist with this  Will recheck lipid panel in 3 months  Continue current medications for now Follow up in 3 months         Return in about 3 months (around 09/30/2022) for HTN, CP, HLD, labs .   I, Jayelyn Barno E Clemons Salvucci, PA-C, have reviewed all documentation for this visit. The documentation on 06/30/22 for the exam, diagnosis, procedures, and orders are all accurate and  complete.   Talitha Givens, MHS, PA-C [facility]

## 2022-06-30 NOTE — Assessment & Plan Note (Signed)
Chronic, historic condition, ongoing Reports she is going to physical therapy and walks as much as she is able at home Reports ongoing problems with pain and muscle stiffness Will increase Ibuprofen to 600 mg PO PRN for pain and recommend she continue Baclofen for muscle spasms at night. She may need to take a half tab in the day PRN for more severe spasms  Continue with PT as directed Follow up as needed

## 2022-07-01 DIAGNOSIS — M7550 Bursitis of unspecified shoulder: Secondary | ICD-10-CM | POA: Diagnosis not present

## 2022-07-01 DIAGNOSIS — G809 Cerebral palsy, unspecified: Secondary | ICD-10-CM | POA: Diagnosis not present

## 2022-07-01 DIAGNOSIS — E785 Hyperlipidemia, unspecified: Secondary | ICD-10-CM | POA: Diagnosis not present

## 2022-07-01 DIAGNOSIS — K5792 Diverticulitis of intestine, part unspecified, without perforation or abscess without bleeding: Secondary | ICD-10-CM | POA: Diagnosis not present

## 2022-07-01 DIAGNOSIS — I10 Essential (primary) hypertension: Secondary | ICD-10-CM | POA: Diagnosis not present

## 2022-07-01 DIAGNOSIS — R7301 Impaired fasting glucose: Secondary | ICD-10-CM | POA: Diagnosis not present

## 2022-07-04 DIAGNOSIS — Z9181 History of falling: Secondary | ICD-10-CM | POA: Diagnosis not present

## 2022-07-04 DIAGNOSIS — E785 Hyperlipidemia, unspecified: Secondary | ICD-10-CM | POA: Diagnosis not present

## 2022-07-04 DIAGNOSIS — F32A Depression, unspecified: Secondary | ICD-10-CM | POA: Diagnosis not present

## 2022-07-04 DIAGNOSIS — F17210 Nicotine dependence, cigarettes, uncomplicated: Secondary | ICD-10-CM | POA: Diagnosis not present

## 2022-07-04 DIAGNOSIS — Z791 Long term (current) use of non-steroidal anti-inflammatories (NSAID): Secondary | ICD-10-CM | POA: Diagnosis not present

## 2022-07-04 DIAGNOSIS — J309 Allergic rhinitis, unspecified: Secondary | ICD-10-CM | POA: Diagnosis not present

## 2022-07-04 DIAGNOSIS — R42 Dizziness and giddiness: Secondary | ICD-10-CM | POA: Diagnosis not present

## 2022-07-04 DIAGNOSIS — I872 Venous insufficiency (chronic) (peripheral): Secondary | ICD-10-CM | POA: Diagnosis not present

## 2022-07-04 DIAGNOSIS — Z7983 Long term (current) use of bisphosphonates: Secondary | ICD-10-CM | POA: Diagnosis not present

## 2022-07-04 DIAGNOSIS — R7301 Impaired fasting glucose: Secondary | ICD-10-CM | POA: Diagnosis not present

## 2022-07-04 DIAGNOSIS — K5792 Diverticulitis of intestine, part unspecified, without perforation or abscess without bleeding: Secondary | ICD-10-CM | POA: Diagnosis not present

## 2022-07-04 DIAGNOSIS — I10 Essential (primary) hypertension: Secondary | ICD-10-CM | POA: Diagnosis not present

## 2022-07-04 DIAGNOSIS — Z87440 Personal history of urinary (tract) infections: Secondary | ICD-10-CM | POA: Diagnosis not present

## 2022-07-04 DIAGNOSIS — G809 Cerebral palsy, unspecified: Secondary | ICD-10-CM | POA: Diagnosis not present

## 2022-07-04 DIAGNOSIS — R270 Ataxia, unspecified: Secondary | ICD-10-CM | POA: Diagnosis not present

## 2022-07-04 DIAGNOSIS — N2889 Other specified disorders of kidney and ureter: Secondary | ICD-10-CM | POA: Diagnosis not present

## 2022-07-04 DIAGNOSIS — M7550 Bursitis of unspecified shoulder: Secondary | ICD-10-CM | POA: Diagnosis not present

## 2022-07-07 DIAGNOSIS — R7301 Impaired fasting glucose: Secondary | ICD-10-CM | POA: Diagnosis not present

## 2022-07-07 DIAGNOSIS — M7550 Bursitis of unspecified shoulder: Secondary | ICD-10-CM | POA: Diagnosis not present

## 2022-07-07 DIAGNOSIS — G809 Cerebral palsy, unspecified: Secondary | ICD-10-CM | POA: Diagnosis not present

## 2022-07-07 DIAGNOSIS — K5792 Diverticulitis of intestine, part unspecified, without perforation or abscess without bleeding: Secondary | ICD-10-CM | POA: Diagnosis not present

## 2022-07-07 DIAGNOSIS — E785 Hyperlipidemia, unspecified: Secondary | ICD-10-CM | POA: Diagnosis not present

## 2022-07-07 DIAGNOSIS — I10 Essential (primary) hypertension: Secondary | ICD-10-CM | POA: Diagnosis not present

## 2022-07-16 DIAGNOSIS — E785 Hyperlipidemia, unspecified: Secondary | ICD-10-CM | POA: Diagnosis not present

## 2022-07-16 DIAGNOSIS — I10 Essential (primary) hypertension: Secondary | ICD-10-CM | POA: Diagnosis not present

## 2022-07-16 DIAGNOSIS — R7301 Impaired fasting glucose: Secondary | ICD-10-CM | POA: Diagnosis not present

## 2022-07-16 DIAGNOSIS — K5792 Diverticulitis of intestine, part unspecified, without perforation or abscess without bleeding: Secondary | ICD-10-CM | POA: Diagnosis not present

## 2022-07-16 DIAGNOSIS — M7550 Bursitis of unspecified shoulder: Secondary | ICD-10-CM | POA: Diagnosis not present

## 2022-07-16 DIAGNOSIS — G809 Cerebral palsy, unspecified: Secondary | ICD-10-CM | POA: Diagnosis not present

## 2022-07-23 DIAGNOSIS — M7550 Bursitis of unspecified shoulder: Secondary | ICD-10-CM | POA: Diagnosis not present

## 2022-07-23 DIAGNOSIS — I10 Essential (primary) hypertension: Secondary | ICD-10-CM | POA: Diagnosis not present

## 2022-07-23 DIAGNOSIS — R7301 Impaired fasting glucose: Secondary | ICD-10-CM | POA: Diagnosis not present

## 2022-07-23 DIAGNOSIS — G809 Cerebral palsy, unspecified: Secondary | ICD-10-CM | POA: Diagnosis not present

## 2022-07-23 DIAGNOSIS — K5792 Diverticulitis of intestine, part unspecified, without perforation or abscess without bleeding: Secondary | ICD-10-CM | POA: Diagnosis not present

## 2022-07-23 DIAGNOSIS — E785 Hyperlipidemia, unspecified: Secondary | ICD-10-CM | POA: Diagnosis not present

## 2022-07-29 ENCOUNTER — Telehealth: Payer: Medicare Other

## 2022-07-29 DIAGNOSIS — K5792 Diverticulitis of intestine, part unspecified, without perforation or abscess without bleeding: Secondary | ICD-10-CM | POA: Diagnosis not present

## 2022-07-29 DIAGNOSIS — I10 Essential (primary) hypertension: Secondary | ICD-10-CM | POA: Diagnosis not present

## 2022-07-29 DIAGNOSIS — G809 Cerebral palsy, unspecified: Secondary | ICD-10-CM | POA: Diagnosis not present

## 2022-07-29 DIAGNOSIS — E785 Hyperlipidemia, unspecified: Secondary | ICD-10-CM | POA: Diagnosis not present

## 2022-07-29 DIAGNOSIS — R7301 Impaired fasting glucose: Secondary | ICD-10-CM | POA: Diagnosis not present

## 2022-07-29 DIAGNOSIS — M7550 Bursitis of unspecified shoulder: Secondary | ICD-10-CM | POA: Diagnosis not present

## 2022-07-31 ENCOUNTER — Ambulatory Visit: Payer: Medicare Other

## 2022-08-03 ENCOUNTER — Ambulatory Visit (INDEPENDENT_AMBULATORY_CARE_PROVIDER_SITE_OTHER): Payer: Medicare Other | Admitting: *Deleted

## 2022-08-03 DIAGNOSIS — F32A Depression, unspecified: Secondary | ICD-10-CM | POA: Diagnosis not present

## 2022-08-03 DIAGNOSIS — Z0000 Encounter for general adult medical examination without abnormal findings: Secondary | ICD-10-CM | POA: Diagnosis not present

## 2022-08-03 DIAGNOSIS — I10 Essential (primary) hypertension: Secondary | ICD-10-CM | POA: Diagnosis not present

## 2022-08-03 DIAGNOSIS — K5792 Diverticulitis of intestine, part unspecified, without perforation or abscess without bleeding: Secondary | ICD-10-CM | POA: Diagnosis not present

## 2022-08-03 DIAGNOSIS — I872 Venous insufficiency (chronic) (peripheral): Secondary | ICD-10-CM | POA: Diagnosis not present

## 2022-08-03 DIAGNOSIS — M7550 Bursitis of unspecified shoulder: Secondary | ICD-10-CM | POA: Diagnosis not present

## 2022-08-03 DIAGNOSIS — R42 Dizziness and giddiness: Secondary | ICD-10-CM | POA: Diagnosis not present

## 2022-08-03 DIAGNOSIS — N2889 Other specified disorders of kidney and ureter: Secondary | ICD-10-CM | POA: Diagnosis not present

## 2022-08-03 DIAGNOSIS — E785 Hyperlipidemia, unspecified: Secondary | ICD-10-CM | POA: Diagnosis not present

## 2022-08-03 DIAGNOSIS — Z9181 History of falling: Secondary | ICD-10-CM | POA: Diagnosis not present

## 2022-08-03 DIAGNOSIS — R7301 Impaired fasting glucose: Secondary | ICD-10-CM | POA: Diagnosis not present

## 2022-08-03 DIAGNOSIS — J309 Allergic rhinitis, unspecified: Secondary | ICD-10-CM | POA: Diagnosis not present

## 2022-08-03 DIAGNOSIS — Z7983 Long term (current) use of bisphosphonates: Secondary | ICD-10-CM | POA: Diagnosis not present

## 2022-08-03 DIAGNOSIS — R270 Ataxia, unspecified: Secondary | ICD-10-CM | POA: Diagnosis not present

## 2022-08-03 DIAGNOSIS — Z87440 Personal history of urinary (tract) infections: Secondary | ICD-10-CM | POA: Diagnosis not present

## 2022-08-03 DIAGNOSIS — Z791 Long term (current) use of non-steroidal anti-inflammatories (NSAID): Secondary | ICD-10-CM | POA: Diagnosis not present

## 2022-08-03 DIAGNOSIS — G809 Cerebral palsy, unspecified: Secondary | ICD-10-CM | POA: Diagnosis not present

## 2022-08-03 DIAGNOSIS — F17210 Nicotine dependence, cigarettes, uncomplicated: Secondary | ICD-10-CM | POA: Diagnosis not present

## 2022-08-03 NOTE — Patient Instructions (Addendum)
Ms. Michelle Chandler , Thank you for taking time to come for your Medicare Wellness Visit. I appreciate your ongoing commitment to your health goals. Please review the following plan we discussed and let me know if I can assist you in the future.   Screening recommendations/referrals: Colonoscopy: Education provided Mammogram: Education provided Recommended yearly ophthalmology/optometry visit for glaucoma screening and checkup Recommended yearly dental visit for hygiene and checkup  Vaccinations: Influenza vaccine: Education provided Pneumococcal vaccine: up to date Tdap vaccine: up to date Shingles vaccine: Education provided    Advanced directives: Education provided  Conditions/risks identified:   Next appointment: 09-30-2022 @ 10:40  [place]   Preventive Care 40-64 Years and Older, Female Preventive care refers to lifestyle choices and visits with your health care provider that can promote health and wellness. What does preventive care include? A yearly physical exam. This is also called an annual well check. Dental exams once or twice a year. Routine eye exams. Ask your health care provider how often you should have your eyes checked. Personal lifestyle choices, including: Daily care of your teeth and gums. Regular physical activity. Eating a healthy diet. Avoiding tobacco and drug use. Limiting alcohol use. Practicing safe sex. Taking low-dose aspirin every day. Taking vitamin and mineral supplements as recommended by your health care provider. What happens during an annual well check? The services and screenings done by your health care provider during your annual well check will depend on your age, overall health, lifestyle risk factors, and family history of disease. Counseling  Your health care provider may ask you questions about your: Alcohol use. Tobacco use. Drug use. Emotional well-being. Home and relationship well-being. Sexual activity. Eating habits. History of  falls. Memory and ability to understand (cognition). Work and work Statistician. Reproductive health. Screening  You may have the following tests or measurements: Height, weight, and BMI. Blood pressure. Lipid and cholesterol levels. These may be checked every 5 years, or more frequently if you are over 72 years old. Skin check. Lung cancer screening. You may have this screening every year starting at age 69 if you have a 30-pack-year history of smoking and currently smoke or have quit within the past 15 years. Fecal occult blood test (FOBT) of the stool. You may have this test every year starting at age 43. Flexible sigmoidoscopy or colonoscopy. You may have a sigmoidoscopy every 5 years or a colonoscopy every 10 years starting at age 72. Hepatitis C blood test. Hepatitis B blood test. Sexually transmitted disease (STD) testing. Diabetes screening. This is done by checking your blood sugar (glucose) after you have not eaten for a while (fasting). You may have this done every 1-3 years. Bone density scan. This is done to screen for osteoporosis. You may have this done starting at age 48. Mammogram. This may be done every 1-2 years. Talk to your health care provider about how often you should have regular mammograms. Talk with your health care provider about your test results, treatment options, and if necessary, the need for more tests. Vaccines  Your health care provider may recommend certain vaccines, such as: Influenza vaccine. This is recommended every year. Tetanus, diphtheria, and acellular pertussis (Tdap, Td) vaccine. You may need a Td booster every 10 years. Zoster vaccine. You may need this after age 61. Pneumococcal 13-valent conjugate (PCV13) vaccine. One dose is recommended after age 34. Pneumococcal polysaccharide (PPSV23) vaccine. One dose is recommended after age 4. Talk to your health care provider about which screenings and vaccines you need  and how often you need  them. This information is not intended to replace advice given to you by your health care provider. Make sure you discuss any questions you have with your health care provider. Document Released: 11/29/2015 Document Revised: 07/22/2016 Document Reviewed: 09/03/2015 Elsevier Interactive Patient Education  2017 [place] Prevention in the Home Falls can cause injuries. They can happen to people of all ages. There are many things you can do to make your home safe and to help prevent falls. What can I do on the outside of my home? Regularly fix the edges of walkways and driveways and fix any cracks. Remove anything that might make you trip as you walk through a door, such as a raised step or threshold. Trim any bushes or trees on the path to your home. Use bright outdoor lighting. Clear any walking paths of anything that might make someone trip, such as rocks or tools. Regularly check to see if handrails are loose or broken. Make sure that both sides of any steps have handrails. Any raised decks and porches should have guardrails on the edges. Have any leaves, snow, or ice cleared regularly. Use sand or salt on walking paths during winter. Clean up any spills in your garage right away. This includes oil or grease spills. What can I do in the bathroom? Use night lights. Install grab bars by the toilet and in the tub and shower. Do not use towel bars as grab bars. Use non-skid mats or decals in the tub or shower. If you need to sit down in the shower, use a plastic, non-slip stool. Keep the floor dry. Clean up any water that spills on the floor as soon as it happens. Remove soap buildup in the tub or shower regularly. Attach bath mats securely with double-sided non-slip rug tape. Do not have throw rugs and other things on the floor that can make you trip. What can I do in the bedroom? Use night lights. Make sure that you have a light by your bed that is easy to reach. Do not use  any sheets or blankets that are too big for your bed. They should not hang down onto the floor. Have a firm chair that has side arms. You can use this for support while you get dressed. Do not have throw rugs and other things on the floor that can make you trip. What can I do in the kitchen? Clean up any spills right away. Avoid walking on wet floors. Keep items that you use a lot in easy-to-reach places. If you need to reach something above you, use a strong step stool that has a grab bar. Keep electrical cords out of the way. Do not use floor polish or wax that makes floors slippery. If you must use wax, use non-skid floor wax. Do not have throw rugs and other things on the floor that can make you trip. What can I do with my stairs? Do not leave any items on the stairs. Make sure that there are handrails on both sides of the stairs and use them. Fix handrails that are broken or loose. Make sure that handrails are as long as the stairways. Check any carpeting to make sure that it is firmly attached to the stairs. Fix any carpet that is loose or worn. Avoid having throw rugs at the top or bottom of the stairs. If you do have throw rugs, attach them to the floor with carpet tape. Make sure that you  have a light switch at the top of the stairs and the bottom of the stairs. If you do not have them, ask someone to add them for you. What else can I do to help prevent falls? Wear shoes that: Do not have high heels. Have rubber bottoms. Are comfortable and fit you well. Are closed at the toe. Do not wear sandals. If you use a stepladder: Make sure that it is fully opened. Do not climb a closed stepladder. Make sure that both sides of the stepladder are locked into place. Ask someone to hold it for you, if possible. Clearly mark and make sure that you can see: Any grab bars or handrails. First and last steps. Where the edge of each step is. Use tools that help you move around (mobility aids)  if they are needed. These include: Canes. Walkers. Scooters. Crutches. Turn on the lights when you go into a dark area. Replace any light bulbs as soon as they burn out. Set up your furniture so you have a clear path. Avoid moving your furniture around. If any of your floors are uneven, fix them. If there are any pets around you, be aware of where they are. Review your medicines with your doctor. Some medicines can make you feel dizzy. This can increase your chance of falling. Ask your doctor what other things that you can do to help prevent falls. This information is not intended to replace advice given to you by your health care provider. Make sure you discuss any questions you have with your health care provider. Document Released: 08/29/2009 Document Revised: 04/09/2016 Document Reviewed: 12/07/2014 Elsevier Interactive Patient Education  2017 [employer].

## 2022-08-03 NOTE — Progress Notes (Signed)
Subjective:   Michelle Chandler is a 66 y.o. female who presents for Medicare Annual (Subsequent) preventive examination.  I connected with  [place] on 08/03/22 by a telephone enabled telemedicine application and verified that I am speaking with the correct person using two identifiers.   I discussed the limitations of evaluation and management by telemedicine. The patient expressed understanding and agreed to proceed.  Patient location: home  Provider location: Tele-Health-home    Review of Systems     Cardiac Risk Factors include: advanced age (>69mn, >>58women);sedentary lifestyle;smoking/ tobacco exposure;hypertension     Objective:    There were no vitals filed for this visit. There is no height or weight on file to calculate BMI.     01/31/2022    2:30 PM 01/30/2022   12:22 PM 07/28/2021   11:17 AM 07/26/2020   11:17 AM 06/21/2019   11:19 AM 09/23/2018    2:01 PM 09/10/2018    1:08 PM  Advanced Directives  Does Patient Have a Medical Advance Directive? No No No No No No No  Would patient like information on creating a medical advance directive? No - Patient declined No - Patient declined     No - Patient declined    Current Medications (verified) Outpatient Encounter Medications as of 08/03/2022  Medication Sig   atorvastatin (LIPITOR) 40 MG tablet Take 1 tablet (40 mg total) by mouth daily.   baclofen (LIORESAL) 10 MG tablet Take 1 tablet (10 mg total) by mouth every evening.   diclofenac Sodium (VOLTAREN) 1 % GEL    diphenhydrAMINE (BENADRYL) 25 MG tablet Take 25 mg by mouth every 6 (six) hours as needed for itching or allergies.   FLUoxetine (PROZAC) 20 MG capsule Take 1 capsule (20 mg total) by mouth daily.   hydrochlorothiazide (HYDRODIURIL) 25 MG tablet Take 1 tablet (25 mg total) by mouth daily.   ibandronate (BONIVA) 150 MG tablet Take 1 tablet (150 mg total) by mouth every 30 (thirty) days. Take in the morning with a full glass of water, on an empty stomach,  and do not take anything else by mouth or lie down for the next 30 min.   ibuprofen (ADVIL) 600 MG tablet Take 1 tablet (600 mg total) by mouth every 8 (eight) hours as needed for moderate pain or cramping.   Incontinence Supply Disposable (ENTRUST PLUS DISP UNDERPADS) MISC by Does not apply route.   Incontinence Supply Disposable (PROTECTIVE UNDERWEAR LARGE) MISC by Does not apply route.   loperamide (IMODIUM) 2 MG capsule Take 1 capsule (2 mg total) by mouth every 8 (eight) hours as needed for diarrhea or loose stools.   LORazepam (ATIVAN) 1 MG tablet    mupirocin ointment (BACTROBAN) 2 %    VENTOLIN HFA 108 (90 Base) MCG/ACT inhaler SMARTSIG:2 Puff(s) By Mouth Every 4 Hours PRN   pantoprazole (PROTONIX) 40 MG tablet Take 1 tablet (40 mg total) by mouth daily.   No facility-administered encounter medications on file as of 08/03/2022.    Allergies (verified) Patient has no known allergies.   History: Past Medical History:  Diagnosis Date   Advance care planning 06/28/2019   Allergic rhinitis    Ataxia    Bursitis of shoulder 11/20/2015   Overview:  Last Assessment & Plan:  Discussed care and treatment shoulder bursitis with meloxicam   CP (cerebral palsy) ([facility]    Depression    Diverticulitis    Diverticulosis    Fatigue 02/05/2020   Hyperlipidemia    Hypertension  IFG (impaired fasting glucose)    Venous insufficiency    Venous stasis ulcer (HCC)    Vertigo    Past Surgical History:  Procedure Laterality Date   ABDOMINAL HYSTERECTOMY     complete   LAPAROSCOPY     legs and hip     due to CP   Family History  Problem Relation Age of Onset   Diabetes Mother    Diabetes Father    Heart disease Maternal Uncle 45   Kidney disease Maternal Uncle    Social History   Socioeconomic History   Marital status: Single    Spouse name: Not on file   Number of children: Not on file   Years of education: Not on file   Highest education level: High school graduate   Occupational History   Occupation: disability   Tobacco Use   Smoking status: Every Day    Packs/day: 0.50    Types: Cigarettes   Smokeless tobacco: Never  Vaping Use   Vaping Use: Never used  Substance and Sexual Activity   Alcohol use: No    Alcohol/week: 0.0 standard drinks of alcohol   Drug use: No   Sexual activity: Not Currently  Other Topics Concern   Not on file  Social History Narrative   Not on file   Social Determinants of Health   Financial Resource Strain: Low Risk  (08/03/2022)   Overall Financial Resource Strain (CARDIA)    Difficulty of Paying Living Expenses: Not hard at all  Food Insecurity: No Food Insecurity (08/03/2022)   Hunger Vital Sign    Worried About Running Out of Food in the Last Year: Never true    [place] in the Last Year: Never true  Transportation Needs: No Transportation Needs (08/03/2022)   PRAPARE - Hydrologist (Medical): No    Lack of Transportation (Non-Medical): No  Physical Activity: Inactive (08/03/2022)   Exercise Vital Sign    Days of Exercise per Week: 0 days    Minutes of Exercise per Session: 0 min  Stress: No Stress Concern Present (08/03/2022)   [place]    Feeling of Stress : Not at all  Social Connections: Socially Isolated (08/03/2022)   Social Connection and Isolation Panel [NHANES]    Frequency of Communication with Friends and Family: Three times a week    Frequency of Social Gatherings with Friends and Family: Once a week    Attends Religious Services: Never    Marine scientist or Organizations: No    Attends Music therapist: Never    Marital Status: Divorced    Tobacco Counseling Ready to quit: Not Answered Counseling given: Not Answered   Clinical Intake:  Pre-visit preparation completed: Yes  Pain : No/denies pain     Diabetes: No  How often do you need to have someone help you  when you read instructions, pamphlets, or other written materials from your doctor or pharmacy?: 1 - Never  Diabetic?  no  Interpreter Needed?: No  Information entered by :: Leroy Kennedy  LPN   Activities of Daily Living    08/03/2022   11:39 AM 01/31/2022    2:30 PM  In your present state of health, do you have any difficulty performing the following activities:  Hearing? 0 0  Vision? 0 0  Difficulty concentrating or making decisions? 0 0  Walking or climbing stairs? 1 1  Dressing  or bathing? 0 1  Doing errands, shopping? 1 0  Preparing Food and eating ? N   Using the Toilet? N   In the past six months, have you accidently leaked urine? Y   Do you have problems with loss of bowel control? N   Managing your Medications? N   Managing your Finances? N   Housekeeping or managing your Housekeeping? Y     Patient Care Team: Jon Billings, NP as PCP - General (Nurse Practitioner) Greg Cutter, LCSW as Social Worker (Licensed Clinical Social Worker)  Indicate any recent [employer] you may have received from other than [facility] providers in the past year (date may be approximate).     Assessment:   This is a routine wellness examination for T[place]ia.  Hearing/Vision screen Hearing Screening - Comments:: No trouble hearing Vision Screening - Comments:: Not up to date [facility]  Dietary issues and exercise activities discussed: Current Exercise Habits: The patient does not participate in regular exercise at present, Exercise limited by: neurologic condition(s)   Goals Addressed   None    Depression Screen    08/03/2022   11:42 AM 06/30/2022   11:43 AM 03/05/2022   11:13 AM 02/05/2022   10:33 AM 12/24/2021   11:35 AM 09/16/2021   11:31 AM 07/28/2021   11:18 AM  PHQ 2/9 Scores  PHQ - 2 Score '2 2 2  2 3 2  '$ PHQ- 9 Score '8 8 4  6 6 3  '$ Exception Documentation    Patient refusal       Fall Risk    06/30/2022   11:42 AM 03/05/2022   11:08 AM 02/05/2022   10:33 AM 12/24/2021    11:35 AM 09/16/2021   11:31 AM  Fall Risk   Falls in the past year? 0 0 0 0 0  Number falls in past yr: 0 0 0 0 0  Injury with Fall? 0 0 0 0 0  Risk for fall due to : Impaired balance/gait;Impaired mobility Impaired balance/gait;Impaired mobility Impaired mobility No Fall Risks Impaired balance/gait;Impaired mobility  Follow up  Falls evaluation completed Falls evaluation completed Falls evaluation completed Falls evaluation completed    FALL RISK PREVENTION PERTAINING TO THE HOME:  Any stairs in or around the home? Yes  If so, are there any without handrails? No  Home free of loose throw rugs in walkways, pet beds, electrical cords, etc? Yes  Adequate lighting in your home to reduce risk of falls? Yes   ASSISTIVE DEVICES UTILIZED TO PREVENT FALLS:  Life alert? No  Use of a cane, walker or w/c? Yes  Grab bars in the bathroom? Yes  Shower chair or bench in shower? Yes  Elevated toilet seat or a handicapped toilet? Yes   TIMED UP AND GO:  Was the test performed? No .    Cognitive Function:        08/03/2022   11:36 AM 07/28/2021   11:22 AM 07/26/2020   11:22 AM 01/28/2018   10:44 AM  6CIT Screen  What Year? 0 points 0 points 4 points 0 points  What month? 0 points 0 points 0 points 0 points  What time? 0 points 0 points 0 points 0 points  Count back from 20 4 points 0 points 0 points 0 points  Months in reverse 4 points 0 points 0 points 0 points  Repeat phrase 4 points 4 points 6 points 4 points  Total Score 12 points 4 points 10 points 4 points  Immunizations Immunization History  Administered Date(s) Administered   Influenza,inj,Quad PF,6+ Mos 10/19/2017, 08/16/2018, 09/16/2021   Influenza-Unspecified 11/14/2014, 10/05/2016, 08/26/2020   PFIZER(Purple Top)SARS-COV-2 Vaccination 05/03/2020, 05/22/2020   Pneumococcal Polysaccharide-23 03/12/2021   Td 08/30/2007, 10/19/2017   Zoster Recombinat (Shingrix) 11/01/2020, 03/18/2021    TDAP status: Up to  date  Flu Vaccine status: Up to date  Pneumococcal vaccine status: Up to date  Covid-19 vaccine status: Information provided on how to obtain vaccines.   Qualifies for Shingles Vaccine? No   Zostavax completed No   Shingrix Completed?: Yes  Screening Tests Health Maintenance  Topic Date Due   FOOT EXAM  Never done   OPHTHALMOLOGY EXAM  Never done   Diabetic kidney evaluation - Urine ACR  11/15/2015   MAMMOGRAM  04/05/2022   INFLUENZA VACCINE  06/16/2022   HEMOGLOBIN A1C  08/02/2022   COVID-19 Vaccine (3 - Pfizer series) 08/19/2022 (Originally 07/17/2020)   COLONOSCOPY (Pts 45-53yr Insurance coverage will need to be confirmed)  02/06/2023 (Originally 02/27/2003)   Diabetic kidney evaluation - GFR measurement  02/06/2023   TETANUS/TDAP  10/20/2027   Hepatitis C Screening  Completed   HIV Screening  Completed   Zoster Vaccines- Shingrix  Completed   HPV VACCINES  Aged Out    Health Maintenance  Health Maintenance Due  Topic Date Due   FOOT EXAM  Never done   OPHTHALMOLOGY EXAM  Never done   Diabetic kidney evaluation - Urine ACR  11/15/2015   MAMMOGRAM  04/05/2022   INFLUENZA VACCINE  06/16/2022   HEMOGLOBIN A1C  08/02/2022  Colonoscopy   declined    Mammogram status: Ordered  . Pt provided with contact info and advised to call to schedule appt.     Lung Cancer Screening: (Low Dose CT Chest recommended if Age 64-80years, 30 pack-year currently smoking OR have quit w/in 15years.) does qualify.   Lung Cancer Screening Referral: patient  will discuss with MD at upcoming visit  Additional Screening:  Hepatitis C Screening: does not qualify; Completed 2017  Vision Screening: Recommended annual ophthalmology exams for early detection of glaucoma and other disorders of the eye. Is the patient up to date with their annual eye exam?  No  Who is the provider or what is the name of the office in which the patient attends annual eye exams? [facility] If pt is not established  with a provider, would they like to be referred to a provider to establish care? No .   Dental Screening: Recommended annual dental exams for proper oral hygiene  Community Resource Referral / Chronic Care Management: CRR required this visit?  No   CCM required this visit?  No      Plan:     I have personally reviewed and noted the following in the patient's chart:   Medical and social history Use of alcohol, tobacco or illicit drugs  Current medications and supplements including opioid prescriptions. Patient is not currently taking opioid prescriptions. Functional ability and status Nutritional status Physical activity Advanced directives List of other physicians Hospitalizations, surgeries, and ER visits in previous 12 months Vitals Screenings to include cognitive, depression, and falls Referrals and appointments  In addition, I have reviewed and discussed with patient certain preventive protocols, quality metrics, and best practice recommendations. A written personalized care plan for preventive services as well as general preventive health recommendations were provided to patient.     JLeroy Kennedy LPN   [id number]  Nurse Notes:

## 2022-08-04 ENCOUNTER — Telehealth: Payer: Self-pay | Admitting: Nurse Practitioner

## 2022-08-04 NOTE — Telephone Encounter (Signed)
Attemped to call Jenny Reichmann from [facility] 2X. No answer. [place]

## 2022-08-04 NOTE — Telephone Encounter (Signed)
Okay to give verbal orders.  Please change appt in November from [place] to me so I can establish care with this patient.

## 2022-08-04 NOTE — Telephone Encounter (Signed)
Copied from [place] [id number]. Topic: Quick Communication - Home Health Verbal Orders >> Aug 04, 2022  8:16 AM Marcellus Scott wrote: Caller/Agency: Jenny Reichmann from [place] Number: [phone] Requesting OT/PT/Skilled Nursing/Social Work/Speech Therapy: PT Frequency: 1w9

## 2022-08-05 NOTE — Telephone Encounter (Signed)
Cindy notified of verbal orders.

## 2022-08-06 DIAGNOSIS — E785 Hyperlipidemia, unspecified: Secondary | ICD-10-CM | POA: Diagnosis not present

## 2022-08-06 DIAGNOSIS — M7550 Bursitis of unspecified shoulder: Secondary | ICD-10-CM | POA: Diagnosis not present

## 2022-08-06 DIAGNOSIS — K5792 Diverticulitis of intestine, part unspecified, without perforation or abscess without bleeding: Secondary | ICD-10-CM | POA: Diagnosis not present

## 2022-08-06 DIAGNOSIS — G809 Cerebral palsy, unspecified: Secondary | ICD-10-CM | POA: Diagnosis not present

## 2022-08-06 DIAGNOSIS — I10 Essential (primary) hypertension: Secondary | ICD-10-CM | POA: Diagnosis not present

## 2022-08-06 DIAGNOSIS — R7301 Impaired fasting glucose: Secondary | ICD-10-CM | POA: Diagnosis not present

## 2022-08-13 ENCOUNTER — Other Ambulatory Visit: Payer: Self-pay

## 2022-08-13 DIAGNOSIS — G801 Spastic diplegic cerebral palsy: Secondary | ICD-10-CM

## 2022-08-13 DIAGNOSIS — M810 Age-related osteoporosis without current pathological fracture: Secondary | ICD-10-CM

## 2022-08-13 DIAGNOSIS — E119 Type 2 diabetes mellitus without complications: Secondary | ICD-10-CM

## 2022-08-13 DIAGNOSIS — E7800 Pure hypercholesterolemia, unspecified: Secondary | ICD-10-CM

## 2022-08-13 MED ORDER — BACLOFEN 10 MG PO TABS: ORAL_TABLET | ORAL | 0 refills | Status: DC

## 2022-08-13 NOTE — Telephone Encounter (Signed)
Medication refill for Baclofen 20 mg  last ov 06/30/22, upcoming ov 09/30/22 . Please advise

## 2022-08-14 ENCOUNTER — Other Ambulatory Visit: Payer: Self-pay | Admitting: Nurse Practitioner

## 2022-08-14 DIAGNOSIS — F419 Anxiety disorder, unspecified: Secondary | ICD-10-CM

## 2022-08-14 NOTE — Telephone Encounter (Signed)
Requested Prescriptions  Pending Prescriptions Disp Refills  . hydrOXYzine (VISTARIL) 25 MG capsule [Pharmacy Med Name: HYDROXYZINE PAM 25 MG CAP] 30 capsule 0    Sig: Take 1 capsule (25 mg total) by mouth every 8 (eight) hours as needed.     Ear, Nose, and Throat:  Antihistamines 2 Passed - 08/14/2022 12:09 PM      Passed - Cr in normal range and within 360 days    Creatinine, Ser  Date Value Ref Range Status  02/05/2022 0.60 0.57 - 1.00 mg/dL Final         Passed - Valid encounter within last 12 months    Recent Outpatient Visits          1 month ago Cerebral palsy, unspecified type ([place])   [place], Erin E, PA-C   5 months ago Diabetes mellitus without complication ([place])   [facility] Vigg, Avanti, MD   6 months ago Diverticulitis   [facility] Vigg, Avanti, MD   7 months ago Diabetes mellitus without complication ([place])   [facility] Vigg, Avanti, MD   11 months ago Prediabetes   [facility] Vigg, Avanti, MD      Future Appointments            In 1 month Jon Billings, NP [employer], [place]

## 2022-08-21 DIAGNOSIS — G809 Cerebral palsy, unspecified: Secondary | ICD-10-CM | POA: Diagnosis not present

## 2022-08-21 DIAGNOSIS — R7301 Impaired fasting glucose: Secondary | ICD-10-CM | POA: Diagnosis not present

## 2022-08-21 DIAGNOSIS — I10 Essential (primary) hypertension: Secondary | ICD-10-CM | POA: Diagnosis not present

## 2022-08-21 DIAGNOSIS — K5792 Diverticulitis of intestine, part unspecified, without perforation or abscess without bleeding: Secondary | ICD-10-CM | POA: Diagnosis not present

## 2022-08-21 DIAGNOSIS — M7550 Bursitis of unspecified shoulder: Secondary | ICD-10-CM | POA: Diagnosis not present

## 2022-08-21 DIAGNOSIS — E785 Hyperlipidemia, unspecified: Secondary | ICD-10-CM | POA: Diagnosis not present

## 2022-08-26 DIAGNOSIS — K5792 Diverticulitis of intestine, part unspecified, without perforation or abscess without bleeding: Secondary | ICD-10-CM | POA: Diagnosis not present

## 2022-08-26 DIAGNOSIS — G809 Cerebral palsy, unspecified: Secondary | ICD-10-CM | POA: Diagnosis not present

## 2022-08-26 DIAGNOSIS — M7550 Bursitis of unspecified shoulder: Secondary | ICD-10-CM | POA: Diagnosis not present

## 2022-08-26 DIAGNOSIS — I10 Essential (primary) hypertension: Secondary | ICD-10-CM | POA: Diagnosis not present

## 2022-08-26 DIAGNOSIS — R7301 Impaired fasting glucose: Secondary | ICD-10-CM | POA: Diagnosis not present

## 2022-08-26 DIAGNOSIS — E785 Hyperlipidemia, unspecified: Secondary | ICD-10-CM | POA: Diagnosis not present

## 2022-08-30 ENCOUNTER — Telehealth: Payer: Self-pay | Admitting: Urology

## 2022-08-30 DIAGNOSIS — N2889 Other specified disorders of kidney and ureter: Secondary | ICD-10-CM

## 2022-08-30 NOTE — Telephone Encounter (Signed)
Seen March 2023 for renal mass.  Follow-up MRI recommended September 2023.  MRI was ordered but never scheduled.

## 2022-08-31 DIAGNOSIS — E785 Hyperlipidemia, unspecified: Secondary | ICD-10-CM | POA: Diagnosis not present

## 2022-08-31 DIAGNOSIS — I10 Essential (primary) hypertension: Secondary | ICD-10-CM | POA: Diagnosis not present

## 2022-08-31 DIAGNOSIS — K5792 Diverticulitis of intestine, part unspecified, without perforation or abscess without bleeding: Secondary | ICD-10-CM | POA: Diagnosis not present

## 2022-08-31 DIAGNOSIS — G809 Cerebral palsy, unspecified: Secondary | ICD-10-CM | POA: Diagnosis not present

## 2022-08-31 DIAGNOSIS — R7301 Impaired fasting glucose: Secondary | ICD-10-CM | POA: Diagnosis not present

## 2022-08-31 DIAGNOSIS — M7550 Bursitis of unspecified shoulder: Secondary | ICD-10-CM | POA: Diagnosis not present

## 2022-09-02 DIAGNOSIS — I872 Venous insufficiency (chronic) (peripheral): Secondary | ICD-10-CM | POA: Diagnosis not present

## 2022-09-02 DIAGNOSIS — R270 Ataxia, unspecified: Secondary | ICD-10-CM | POA: Diagnosis not present

## 2022-09-02 DIAGNOSIS — F17210 Nicotine dependence, cigarettes, uncomplicated: Secondary | ICD-10-CM | POA: Diagnosis not present

## 2022-09-02 DIAGNOSIS — J309 Allergic rhinitis, unspecified: Secondary | ICD-10-CM | POA: Diagnosis not present

## 2022-09-02 DIAGNOSIS — N2889 Other specified disorders of kidney and ureter: Secondary | ICD-10-CM | POA: Diagnosis not present

## 2022-09-02 DIAGNOSIS — I10 Essential (primary) hypertension: Secondary | ICD-10-CM | POA: Diagnosis not present

## 2022-09-02 DIAGNOSIS — R7301 Impaired fasting glucose: Secondary | ICD-10-CM | POA: Diagnosis not present

## 2022-09-02 DIAGNOSIS — E785 Hyperlipidemia, unspecified: Secondary | ICD-10-CM | POA: Diagnosis not present

## 2022-09-02 DIAGNOSIS — Z87440 Personal history of urinary (tract) infections: Secondary | ICD-10-CM | POA: Diagnosis not present

## 2022-09-02 DIAGNOSIS — R42 Dizziness and giddiness: Secondary | ICD-10-CM | POA: Diagnosis not present

## 2022-09-02 DIAGNOSIS — G809 Cerebral palsy, unspecified: Secondary | ICD-10-CM | POA: Diagnosis not present

## 2022-09-02 DIAGNOSIS — F32A Depression, unspecified: Secondary | ICD-10-CM | POA: Diagnosis not present

## 2022-09-02 DIAGNOSIS — Z9181 History of falling: Secondary | ICD-10-CM | POA: Diagnosis not present

## 2022-09-02 DIAGNOSIS — Z7983 Long term (current) use of bisphosphonates: Secondary | ICD-10-CM | POA: Diagnosis not present

## 2022-09-02 DIAGNOSIS — M7550 Bursitis of unspecified shoulder: Secondary | ICD-10-CM | POA: Diagnosis not present

## 2022-09-02 DIAGNOSIS — K5792 Diverticulitis of intestine, part unspecified, without perforation or abscess without bleeding: Secondary | ICD-10-CM | POA: Diagnosis not present

## 2022-09-02 DIAGNOSIS — Z791 Long term (current) use of non-steroidal anti-inflammatories (NSAID): Secondary | ICD-10-CM | POA: Diagnosis not present

## 2022-09-04 ENCOUNTER — Ambulatory Visit
Admission: RE | Admit: 2022-09-04 | Discharge: 2022-09-04 | Disposition: A | Discharge status: Home / Self Care | Payer: Medicare Other | Source: Ambulatory Visit | Attending: Urology | Admitting: Urology

## 2022-09-04 DIAGNOSIS — K769 Liver disease, unspecified: Secondary | ICD-10-CM | POA: Diagnosis not present

## 2022-09-04 DIAGNOSIS — N2889 Other specified disorders of kidney and ureter: Secondary | ICD-10-CM | POA: Insufficient documentation

## 2022-09-04 DIAGNOSIS — K5730 Diverticulosis of large intestine without perforation or abscess without bleeding: Secondary | ICD-10-CM | POA: Diagnosis not present

## 2022-09-04 DIAGNOSIS — K449 Diaphragmatic hernia without obstruction or gangrene: Secondary | ICD-10-CM | POA: Diagnosis not present

## 2022-09-04 MED ORDER — GADOBUTROL 1 MMOL/ML IV SOLN
6.0000 mL | Freq: Once | INTRAVENOUS | Status: AC | PRN
  Administered 2022-09-04: 6 mL via INTRAVENOUS

## 2022-09-08 NOTE — Telephone Encounter (Signed)
Notified patient as instructed, patient pleased. Discussed follow-up appointments, patient agrees .  Scheduled 9 month f/u.

## 2022-09-08 NOTE — Telephone Encounter (Signed)
I contacted Michelle Chandler to discuss her follow-up renal mass protocol MRI.  I spoke with her mother, Michelle Chandler; who requested I give her the results.  She was listed on the [facility].  The renal mass is stable at 2 x 2.4 cm.  It is enhancing and we discussed there is a 70% chance this is a small renal cell carcinoma.  Options were reviewed including continued active surveillance, renal mass biopsy and consideration of percutaneous ablation.  The mass is interpolar and may not be amenable to partial nephrectomy.  She will discuss findings with her daughter.  We will go ahead and tentatively schedule a follow-up visit in 9 months with renal CT prior.

## 2022-09-09 DIAGNOSIS — I10 Essential (primary) hypertension: Secondary | ICD-10-CM | POA: Diagnosis not present

## 2022-09-09 DIAGNOSIS — R7301 Impaired fasting glucose: Secondary | ICD-10-CM | POA: Diagnosis not present

## 2022-09-09 DIAGNOSIS — E785 Hyperlipidemia, unspecified: Secondary | ICD-10-CM | POA: Diagnosis not present

## 2022-09-09 DIAGNOSIS — M7550 Bursitis of unspecified shoulder: Secondary | ICD-10-CM | POA: Diagnosis not present

## 2022-09-09 DIAGNOSIS — K5792 Diverticulitis of intestine, part unspecified, without perforation or abscess without bleeding: Secondary | ICD-10-CM | POA: Diagnosis not present

## 2022-09-09 DIAGNOSIS — G809 Cerebral palsy, unspecified: Secondary | ICD-10-CM | POA: Diagnosis not present

## 2022-09-15 ENCOUNTER — Other Ambulatory Visit: Payer: Self-pay | Admitting: Nurse Practitioner

## 2022-09-15 DIAGNOSIS — M810 Age-related osteoporosis without current pathological fracture: Secondary | ICD-10-CM

## 2022-09-15 DIAGNOSIS — E7800 Pure hypercholesterolemia, unspecified: Secondary | ICD-10-CM

## 2022-09-15 DIAGNOSIS — G801 Spastic diplegic cerebral palsy: Secondary | ICD-10-CM

## 2022-09-15 DIAGNOSIS — E119 Type 2 diabetes mellitus without complications: Secondary | ICD-10-CM

## 2022-09-15 NOTE — Telephone Encounter (Signed)
Requested Prescriptions  Pending Prescriptions Disp Refills  . atorvastatin (LIPITOR) 40 MG tablet [Pharmacy Med Name: ATORVASTATIN 40 MG TABLET] 90 tablet 0    Sig: Take 1 tablet (40 mg total) by mouth daily.     Cardiovascular:  Antilipid - Statins Failed - 09/15/2022  1:46 PM      Failed - Lipid Panel in normal range within the last 12 months    Cholesterol, Total  Date Value Ref Range Status  12/24/2021 162 100 - 199 mg/dL Final   Cholesterol Piccolo, Waived  Date Value Ref Range Status  08/16/2018 231 (H) <200 mg/dL Final    Comment:                            Desirable                <200                         Borderline High      200- 239                         High                     >239    LDL Chol Calc (NIH)  Date Value Ref Range Status  12/24/2021 85 0 - 99 mg/dL Final   HDL  Date Value Ref Range Status  12/24/2021 48 >39 mg/dL Final   Triglycerides  Date Value Ref Range Status  12/24/2021 172 (H) 0 - 149 mg/dL Final   Triglycerides Piccolo,Waived  Date Value Ref Range Status  08/16/2018 266 (H) <150 mg/dL Final    Comment:                            Normal                   <150                         Borderline High     150 - 199                         High                200 - 499                         Very High                >499          Passed - Patient is not pregnant      Passed - Valid encounter within last 12 months    Recent Outpatient Visits          2 months ago Cerebral palsy, unspecified type ([place])   [place], Erin E, PA-C   6 months ago Diabetes mellitus without complication ([place])   [facility] Vigg, Avanti, MD   7 months ago Diverticulitis   [facility] Vigg, Avanti, MD   8 months ago Diabetes mellitus without complication ([place])   [facility] Vigg, Avanti, MD   12 months ago Prediabetes   [facility] Vigg, Avanti, MD  Future Appointments             In 2 weeks Jon Billings, NP [facility], [place]   In 5 months Stoioff, Ronda Fairly, MD [place]

## 2022-09-15 NOTE — Telephone Encounter (Signed)
Requested Prescriptions  Pending Prescriptions Disp Refills  . baclofen (LIORESAL) 10 MG tablet [Pharmacy Med Name: BACLOFEN 10 MG TABLET] 90 tablet 0    Sig: Take 1 tablet (10 mg total) by mouth every evening.     Analgesics:  Muscle Relaxants - baclofen Failed - 09/15/2022  5:06 PM      Failed - Cr in normal range and within 180 days    Creatinine, Ser  Date Value Ref Range Status  02/05/2022 0.60 0.57 - 1.00 mg/dL Final         Failed - eGFR is 30 or above and within 180 days    GFR calc Af Amer  Date Value Ref Range Status  07/11/2020 108 >59 mL/min/1.73 Final    Comment:    **[facility] currently reports eGFR in compliance with the current**   recommendations of the [employer]. [facility] will   update reporting as new guidelines are published from the NKF-ASN   Task force.    GFR, Estimated  Date Value Ref Range Status  02/02/2022 >60 >60 mL/min Final    Comment:    (NOTE) Calculated using the CKD-EPI Creatinine Equation (2021)    eGFR  Date Value Ref Range Status  02/05/2022 101 >59 mL/min/1.73 Final         Passed - Valid encounter within last 6 months    Recent Outpatient Visits          2 months ago Cerebral palsy, unspecified type ([place])   [place], Erin E, PA-C   6 months ago Diabetes mellitus without complication ([place])   [facility] Vigg, Avanti, MD   7 months ago Diverticulitis   [facility] Vigg, Avanti, MD   8 months ago Diabetes mellitus without complication ([place])   [facility] Vigg, Avanti, MD   12 months ago Prediabetes   [place] Vigg, Avanti, MD      Future Appointments            In 2 weeks Jon Billings, NP [facility], [place]   In 5 months Stoioff, Ronda Fairly, MD [place]           . FLUoxetine (PROZAC) 20 MG capsule [Pharmacy Med Name: FLUOXETINE HCL 20 MG CAPSULE] 90 capsule 1    Sig: Take 1 capsule (20 mg total)  by mouth daily.     Psychiatry:  Antidepressants - SSRI Passed - 09/15/2022  5:06 PM      Passed - Completed PHQ-2 or PHQ-9 in the last 360 days      Passed - Valid encounter within last 6 months    Recent Outpatient Visits          2 months ago Cerebral palsy, unspecified type ([place])   [place], Erin E, PA-C   6 months ago Diabetes mellitus without complication ([place])   [facility] Vigg, Avanti, MD   7 months ago Diverticulitis   [facility] Vigg, Avanti, MD   8 months ago Diabetes mellitus without complication ([place])   [facility] Vigg, Avanti, MD   12 months ago Prediabetes   [place] Vigg, Avanti, MD      Future Appointments            In 2 weeks Jon Billings, NP [facility], [place]   In 5 months Stoioff, Ronda Fairly, MD [place]

## 2022-09-15 NOTE — Telephone Encounter (Signed)
medication was discontinued 12/24/2021 - not on med list. Requested Prescriptions  Pending Prescriptions Disp Refills  . omeprazole (PRILOSEC) 20 MG capsule [Pharmacy Med Name: OMEPRAZOLE DR 20 MG CAPSULE] 90 capsule 0    Sig: Take 1 capsule (20 mg total) by mouth daily.     Gastroenterology: Proton Pump Inhibitors Passed - 09/15/2022  1:49 PM      Passed - Valid encounter within last 12 months    Recent Outpatient Visits          2 months ago Cerebral palsy, unspecified type ([place])   [place], Erin E, PA-C   6 months ago Diabetes mellitus without complication ([place])   [facility] Vigg, Avanti, MD   7 months ago Diverticulitis   [facility] Vigg, Avanti, MD   8 months ago Diabetes mellitus without complication ([place])   [facility] Vigg, Avanti, MD   12 months ago Prediabetes   [place] Vigg, Avanti, MD      Future Appointments            In 2 weeks Jon Billings, NP [facility], [place]   In 5 months Stoioff, Ronda Fairly, MD [place]

## 2022-09-17 DIAGNOSIS — I10 Essential (primary) hypertension: Secondary | ICD-10-CM | POA: Diagnosis not present

## 2022-09-17 DIAGNOSIS — K5792 Diverticulitis of intestine, part unspecified, without perforation or abscess without bleeding: Secondary | ICD-10-CM | POA: Diagnosis not present

## 2022-09-17 DIAGNOSIS — E785 Hyperlipidemia, unspecified: Secondary | ICD-10-CM | POA: Diagnosis not present

## 2022-09-17 DIAGNOSIS — R7301 Impaired fasting glucose: Secondary | ICD-10-CM | POA: Diagnosis not present

## 2022-09-17 DIAGNOSIS — M7550 Bursitis of unspecified shoulder: Secondary | ICD-10-CM | POA: Diagnosis not present

## 2022-09-17 DIAGNOSIS — G809 Cerebral palsy, unspecified: Secondary | ICD-10-CM | POA: Diagnosis not present

## 2022-09-18 ENCOUNTER — Telehealth: Payer: Self-pay | Admitting: Nurse Practitioner

## 2022-09-18 NOTE — Telephone Encounter (Signed)
Copied from [place] [id number]. Topic: General - Other >> Sep 18, 2022  4:33 PM [place] B wrote: Reason for CRM: Glendell Docker from health and wellness called in about form faxed in  on 10/31 and 11/03 for immuno deficiency test for patient. Please call back if received.

## 2022-09-21 NOTE — Telephone Encounter (Signed)
Called [facility] back to inform that we have received paperwork, we are just waiting for the provider to sign off on the documents. Tesa from health & wellness verbalized understanding.

## 2022-09-21 NOTE — Telephone Encounter (Signed)
Called patient to let her know she needs an OV or discuss at next OV regarding immunodeficiency paperwork, patient stated she would like to decline testing at this time.

## 2022-09-24 DIAGNOSIS — M7550 Bursitis of unspecified shoulder: Secondary | ICD-10-CM | POA: Diagnosis not present

## 2022-09-24 DIAGNOSIS — K5792 Diverticulitis of intestine, part unspecified, without perforation or abscess without bleeding: Secondary | ICD-10-CM | POA: Diagnosis not present

## 2022-09-24 DIAGNOSIS — I10 Essential (primary) hypertension: Secondary | ICD-10-CM | POA: Diagnosis not present

## 2022-09-24 DIAGNOSIS — E785 Hyperlipidemia, unspecified: Secondary | ICD-10-CM | POA: Diagnosis not present

## 2022-09-24 DIAGNOSIS — G809 Cerebral palsy, unspecified: Secondary | ICD-10-CM | POA: Diagnosis not present

## 2022-09-24 DIAGNOSIS — R7301 Impaired fasting glucose: Secondary | ICD-10-CM | POA: Diagnosis not present

## 2022-09-28 DIAGNOSIS — E785 Hyperlipidemia, unspecified: Secondary | ICD-10-CM | POA: Diagnosis not present

## 2022-09-28 DIAGNOSIS — G809 Cerebral palsy, unspecified: Secondary | ICD-10-CM | POA: Diagnosis not present

## 2022-09-28 DIAGNOSIS — K5792 Diverticulitis of intestine, part unspecified, without perforation or abscess without bleeding: Secondary | ICD-10-CM | POA: Diagnosis not present

## 2022-09-28 DIAGNOSIS — I10 Essential (primary) hypertension: Secondary | ICD-10-CM | POA: Diagnosis not present

## 2022-09-28 DIAGNOSIS — M7550 Bursitis of unspecified shoulder: Secondary | ICD-10-CM | POA: Diagnosis not present

## 2022-09-28 DIAGNOSIS — R7301 Impaired fasting glucose: Secondary | ICD-10-CM | POA: Diagnosis not present

## 2022-09-29 NOTE — Progress Notes (Unsigned)
There were no vitals taken for this visit.   Subjective:    Patient ID: Michelle Chandler, female    DOB: [birth year], 66 y.o.   MRN: [id number]  HPI: Michelle Chandler is a 66 y.o. female  No chief complaint on file.  HYPERTENSION / HYPERLIPIDEMIA Satisfied with current treatment? {Blank single:19197::"yes","no"} Duration of hypertension: {Blank single:19197::"chronic","months","years"} BP monitoring frequency: {Blank single:19197::"not checking","rarely","daily","weekly","monthly","a few times a day","a few times a week","a few times a month"} BP range:  BP medication side effects: {Blank single:19197::"yes","no"} Past BP meds: {Blank FGHWEXHB:71696::"VELF","YBOFBPZWCH","ENIDPOEUMP/NTIRWERXVQ","MGQQPYPP","JKDTOIZTIW","PYKDXIPJAS/NKNL","ZJQBHALPFX (bystolic)","carvedilol","chlorthalidone","clonidine","diltiazem","exforge HCT","HCTZ","irbesartan (avapro)","labetalol","lisinopril","lisinopril-HCTZ","losartan (cozaar)","methyldopa","nifedipine","olmesartan (benicar)","olmesartan-HCTZ","quinapril","ramipril","spironalactone","tekturna","valsartan","valsartan-HCTZ","verapamil"} Duration of hyperlipidemia: {Blank single:19197::"chronic","months","years"} Cholesterol medication side effects: {Blank single:19197::"yes","no"} Cholesterol supplements: {Blank multiple:19196::"none","fish oil","niacin","red yeast rice"} Past cholesterol medications: {Blank multiple:19196::"none","atorvastain (lipitor)","lovastatin (mevacor)","pravastatin (pravachol)","rosuvastatin (crestor)","simvastatin (zocor)","vytorin","fenofibrate (tricor)","gemfibrozil","ezetimide (zetia)","niaspan","lovaza"} Medication compliance: {Blank single:19197::"excellent compliance","good compliance","fair compliance","poor compliance"} Aspirin: {Blank single:19197::"yes","no"} Recent stressors: {Blank single:19197::"yes","no"} Recurrent headaches: {Blank single:19197::"yes","no"} Visual changes: {Blank single:19197::"yes","no"} Palpitations:  {Blank single:19197::"yes","no"} Dyspnea: {Blank single:19197::"yes","no"} Chest pain: {Blank single:19197::"yes","no"} Lower extremity edema: {Blank single:19197::"yes","no"} Dizzy/lightheaded: {Blank single:19197::"yes","no"}  DIABETES Hypoglycemic episodes:{Blank single:19197::"yes","no"} Polydipsia/polyuria: {Blank single:19197::"yes","no"} Visual disturbance: {Blank single:19197::"yes","no"} Chest pain: {Blank single:19197::"yes","no"} Paresthesias: {Blank single:19197::"yes","no"} Glucose Monitoring: {Blank single:19197::"yes","no"}  Accucheck frequency: {Blank single:19197::"Not Checking","Daily","BID","TID"}  Fasting glucose:  Post prandial:  Evening:  Before meals: Taking Insulin?: {Blank single:19197::"yes","no"}  Long acting insulin:  Short acting insulin: Blood Pressure Monitoring: {Blank single:19197::"not checking","rarely","daily","weekly","monthly","a few times a day","a few times a week","a few times a month"} Retinal Examination: {Blank single:19197::"Up to Date","Not up to Date"} Foot Exam: {Blank single:19197::"Up to Date","Not up to Date"} Diabetic Education: {Blank single:19197::"Completed","Not Completed"} Pneumovax: {Blank single:19197::"Up to Date","Not up to Date","unknown"} Influenza: {Blank single:19197::"Up to Date","Not up to Date","unknown"} Aspirin: {Blank single:19197::"yes","no"}  Relevant past medical, surgical, family and social history reviewed and updated as indicated. Interim medical history since our last visit reviewed. Allergies and medications reviewed and updated.  Review of Systems  Per HPI unless specifically indicated above     Objective:    There were no vitals taken for this visit.  Wt Readings from Last 3 Encounters:  06/30/22 149 lb 9.6 oz (67.9 kg)  02/11/22 150 lb (68 kg)  02/05/22 150 lb (68 kg)    Physical Exam  Results for orders placed or performed in visit on 02/11/22  Microscopic Examination   Urine  Result  Value Ref Range   WBC, UA 0-5 0 - 5 /hpf   RBC, Urine 0-2 0 - 2 /hpf   Epithelial Cells (non renal) 0-10 0 - 10 /hpf   Bacteria, UA None seen None seen/Few  Urinalysis, Complete  Result Value Ref Range   Specific Gravity, UA 1.010 1.005 - 1.030   pH, UA 7.0 5.0 - 7.5   Color, UA Yellow Yellow   Appearance Ur Clear Clear   Leukocytes,UA Trace (A) Negative   Protein,UA Negative Negative/Trace   Glucose, UA Negative Negative   Ketones, UA Negative Negative   RBC, UA 1+ (A) Negative   Bilirubin, UA Negative Negative   Urobilinogen, Ur 0.2 0.2 - 1.0 mg/dL   Nitrite, UA Negative Negative   Microscopic Examination See below:       Assessment & Plan:   Problem List Items Addressed This Visit       Cardiovascular and Mediastinum   Essential (primary) hypertension - Primary     Endocrine   Diabetes mellitus type 2 with complications (HCC)     Nervous and Auditory   Cerebral palsy (HCC)     Other   Hyperlipidemia   Depression, recurrent (HCC)   Smoking greater than  40 pack years     Follow up plan: No follow-ups on file.

## 2022-09-30 ENCOUNTER — Encounter: Payer: Self-pay | Admitting: Nurse Practitioner

## 2022-09-30 ENCOUNTER — Ambulatory Visit (INDEPENDENT_AMBULATORY_CARE_PROVIDER_SITE_OTHER): Payer: Medicare Other | Admitting: Nurse Practitioner

## 2022-09-30 VITALS — BP 122/79 | HR 62 | Temp 98.2°F | Wt 138.3 lb

## 2022-09-30 DIAGNOSIS — E118 Type 2 diabetes mellitus with unspecified complications: Secondary | ICD-10-CM | POA: Diagnosis not present

## 2022-09-30 DIAGNOSIS — F17210 Nicotine dependence, cigarettes, uncomplicated: Secondary | ICD-10-CM

## 2022-09-30 DIAGNOSIS — Z1231 Encounter for screening mammogram for malignant neoplasm of breast: Secondary | ICD-10-CM | POA: Diagnosis not present

## 2022-09-30 DIAGNOSIS — G809 Cerebral palsy, unspecified: Secondary | ICD-10-CM

## 2022-09-30 DIAGNOSIS — G801 Spastic diplegic cerebral palsy: Secondary | ICD-10-CM | POA: Diagnosis not present

## 2022-09-30 DIAGNOSIS — I10 Essential (primary) hypertension: Secondary | ICD-10-CM | POA: Diagnosis not present

## 2022-09-30 DIAGNOSIS — Z23 Encounter for immunization: Secondary | ICD-10-CM | POA: Diagnosis not present

## 2022-09-30 DIAGNOSIS — F339 Major depressive disorder, recurrent, unspecified: Secondary | ICD-10-CM | POA: Diagnosis not present

## 2022-09-30 DIAGNOSIS — M810 Age-related osteoporosis without current pathological fracture: Secondary | ICD-10-CM

## 2022-09-30 DIAGNOSIS — F17200 Nicotine dependence, unspecified, uncomplicated: Secondary | ICD-10-CM

## 2022-09-30 DIAGNOSIS — E7800 Pure hypercholesterolemia, unspecified: Secondary | ICD-10-CM

## 2022-09-30 MED ORDER — HYDROXYZINE PAMOATE 25 MG PO CAPS: 25.0000 mg | ORAL_CAPSULE | Freq: Three times a day (TID) | ORAL | 1 refills | Status: DC | PRN

## 2022-09-30 MED ORDER — BACLOFEN 10 MG PO TABS: ORAL_TABLET | ORAL | 1 refills | Status: DC

## 2022-09-30 MED ORDER — HYDROCHLOROTHIAZIDE 25 MG PO TABS: 25.0000 mg | ORAL_TABLET | Freq: Every day | ORAL | 3 refills | Status: DC

## 2022-09-30 MED ORDER — ATORVASTATIN CALCIUM 40 MG PO TABS: 40.0000 mg | ORAL_TABLET | Freq: Every day | ORAL | 1 refills | Status: DC

## 2022-09-30 MED ORDER — FLUOXETINE HCL 20 MG PO CAPS: 20.0000 mg | ORAL_CAPSULE | Freq: Every day | ORAL | 1 refills | Status: DC

## 2022-09-30 MED ORDER — PANTOPRAZOLE SODIUM 40 MG PO TBEC: 40.0000 mg | DELAYED_RELEASE_TABLET | Freq: Every day | ORAL | 1 refills | Status: DC

## 2022-09-30 NOTE — Assessment & Plan Note (Signed)
Chronic.  Controlled.  Continue with current medication regimen of Atorvastatin daily.  Labs ordered today.  Return to clinic in 6 months for reevaluation.  Call sooner if concerns arise.   

## 2022-09-30 NOTE — Assessment & Plan Note (Signed)
Chronic.  Controlled.  Continue with current medication regimen.  Labs ordered today.  Return to clinic in 6 months for reevaluation.  Call sooner if concerns arise.  ? ?

## 2022-09-30 NOTE — Assessment & Plan Note (Signed)
Chronic.  Controlled without medication.  A1c has been well controlled in the prediabetic range.  Labs ordered today.  Return to clinic in 6 months for reevaluation.  Call sooner if concerns arise.

## 2022-09-30 NOTE — Assessment & Plan Note (Signed)
Chronic.  Controlled.  Continue with current medication regimen of Fluoxetine '40mg'$ .  Refills sent today.  Labs ordered today.  Return to clinic in 6 months for reevaluation.  Call sooner if concerns arise.

## 2022-09-30 NOTE — Assessment & Plan Note (Signed)
Chronic.  Controlled.  Continue with current medication regimen of Hydrochlorothiazide daily.  Refills sent today.  Labs ordered today.  Return to clinic in 6 months for reevaluation.  Call sooner if concerns arise.

## 2022-10-01 ENCOUNTER — Telehealth: Payer: Self-pay

## 2022-10-01 LAB — LIPID PANEL
Chol/HDL Ratio: 5 ratio — ABNORMAL HIGH (ref 0.0–4.4)
Cholesterol, Total: 205 mg/dL — ABNORMAL HIGH (ref 100–199)
HDL: 41 mg/dL (ref >39)
LDL Chol Calc (NIH): 126 mg/dL — ABNORMAL HIGH (ref 0–99)
Triglycerides: 212 mg/dL — ABNORMAL HIGH (ref 0–149)
VLDL Cholesterol Cal: 38 mg/dL (ref 5–40)

## 2022-10-01 LAB — COMPREHENSIVE METABOLIC PANEL
ALT: 21 IU/L (ref 0–32)
AST: 20 IU/L (ref 0–40)
Albumin/Globulin Ratio: 1.7 (ref 1.2–2.2)
Albumin: 4.5 g/dL (ref 3.9–4.9)
Alkaline Phosphatase: 134 IU/L — ABNORMAL HIGH (ref 44–121)
BUN/Creatinine Ratio: 16 (ref 12–28)
BUN: 11 mg/dL (ref 8–27)
Bilirubin Total: 0.3 mg/dL (ref 0.0–1.2)
CO2: 29 mmol/L (ref 20–29)
Calcium: 10.4 mg/dL — ABNORMAL HIGH (ref 8.7–10.3)
Chloride: 94 mmol/L — ABNORMAL LOW (ref 96–106)
Creatinine, Ser: 0.67 mg/dL (ref 0.57–1.00)
Globulin, Total: 2.6 g/dL (ref 1.5–4.5)
Glucose: 93 mg/dL (ref 70–99)
Potassium: 4.2 mmol/L (ref 3.5–5.2)
Sodium: 137 mmol/L (ref 134–144)
Total Protein: 7.1 g/dL (ref 6.0–8.5)
eGFR: 98 mL/min/{1.73_m2} (ref >59)

## 2022-10-01 LAB — HEMOGLOBIN A1C
Est. average glucose Bld gHb Est-mCnc: 131 mg/dL
Hgb A1c MFr Bld: 6.2 % — ABNORMAL HIGH (ref 4.8–5.6)

## 2022-10-01 NOTE — Progress Notes (Signed)
Please let patient know that her lab work looks good.  Her cholesterol is elevated.  I recommend she continue with her atorvastatin. Her A1c is well controlled at 6.2.  No concerns at this time. Continue with current medication regimen.

## 2022-10-01 NOTE — Telephone Encounter (Signed)
Home health orders have been faxed back to [facility] [facility].

## 2022-10-02 DIAGNOSIS — F32A Depression, unspecified: Secondary | ICD-10-CM | POA: Diagnosis not present

## 2022-10-02 DIAGNOSIS — Z87440 Personal history of urinary (tract) infections: Secondary | ICD-10-CM | POA: Diagnosis not present

## 2022-10-02 DIAGNOSIS — G809 Cerebral palsy, unspecified: Secondary | ICD-10-CM | POA: Diagnosis not present

## 2022-10-02 DIAGNOSIS — Z9181 History of falling: Secondary | ICD-10-CM | POA: Diagnosis not present

## 2022-10-02 DIAGNOSIS — I10 Essential (primary) hypertension: Secondary | ICD-10-CM | POA: Diagnosis not present

## 2022-10-02 DIAGNOSIS — E785 Hyperlipidemia, unspecified: Secondary | ICD-10-CM | POA: Diagnosis not present

## 2022-10-02 DIAGNOSIS — I872 Venous insufficiency (chronic) (peripheral): Secondary | ICD-10-CM | POA: Diagnosis not present

## 2022-10-02 DIAGNOSIS — Z792 Long term (current) use of antibiotics: Secondary | ICD-10-CM | POA: Diagnosis not present

## 2022-10-02 DIAGNOSIS — M7550 Bursitis of unspecified shoulder: Secondary | ICD-10-CM | POA: Diagnosis not present

## 2022-10-02 DIAGNOSIS — J309 Allergic rhinitis, unspecified: Secondary | ICD-10-CM | POA: Diagnosis not present

## 2022-10-02 DIAGNOSIS — K5730 Diverticulosis of large intestine without perforation or abscess without bleeding: Secondary | ICD-10-CM | POA: Diagnosis not present

## 2022-10-02 DIAGNOSIS — N2889 Other specified disorders of kidney and ureter: Secondary | ICD-10-CM | POA: Diagnosis not present

## 2022-10-02 DIAGNOSIS — R270 Ataxia, unspecified: Secondary | ICD-10-CM | POA: Diagnosis not present

## 2022-10-02 DIAGNOSIS — Z7983 Long term (current) use of bisphosphonates: Secondary | ICD-10-CM | POA: Diagnosis not present

## 2022-10-02 DIAGNOSIS — Z791 Long term (current) use of non-steroidal anti-inflammatories (NSAID): Secondary | ICD-10-CM | POA: Diagnosis not present

## 2022-10-02 DIAGNOSIS — R42 Dizziness and giddiness: Secondary | ICD-10-CM | POA: Diagnosis not present

## 2022-10-02 DIAGNOSIS — F17210 Nicotine dependence, cigarettes, uncomplicated: Secondary | ICD-10-CM | POA: Diagnosis not present

## 2022-10-02 DIAGNOSIS — R7301 Impaired fasting glucose: Secondary | ICD-10-CM | POA: Diagnosis not present

## 2022-10-02 DIAGNOSIS — K279 Peptic ulcer, site unspecified, unspecified as acute or chronic, without hemorrhage or perforation: Secondary | ICD-10-CM | POA: Diagnosis not present

## 2022-10-06 ENCOUNTER — Telehealth: Payer: Self-pay | Admitting: Nurse Practitioner

## 2022-10-06 NOTE — Telephone Encounter (Signed)
OK for verbals 

## 2022-10-06 NOTE — Telephone Encounter (Signed)
Copied from [place] [id number]. Topic: Quick Communication - Home Health Verbal Orders >> Oct 06, 2022  8:26 AM Marcellus Scott wrote: Caller/Agency: [facility] Number: [phone] Requesting OT/PT/Skilled Nursing/Social Work/Speech Therapy: PT Frequency: 1w8

## 2022-10-06 NOTE — Telephone Encounter (Signed)
LVMTRC 

## 2022-10-06 NOTE — Telephone Encounter (Signed)
Okay for verbal orders. 

## 2022-10-07 DIAGNOSIS — I10 Essential (primary) hypertension: Secondary | ICD-10-CM | POA: Diagnosis not present

## 2022-10-07 DIAGNOSIS — G809 Cerebral palsy, unspecified: Secondary | ICD-10-CM | POA: Diagnosis not present

## 2022-10-07 DIAGNOSIS — K5730 Diverticulosis of large intestine without perforation or abscess without bleeding: Secondary | ICD-10-CM | POA: Diagnosis not present

## 2022-10-07 DIAGNOSIS — M7550 Bursitis of unspecified shoulder: Secondary | ICD-10-CM | POA: Diagnosis not present

## 2022-10-07 DIAGNOSIS — I872 Venous insufficiency (chronic) (peripheral): Secondary | ICD-10-CM | POA: Diagnosis not present

## 2022-10-07 DIAGNOSIS — E785 Hyperlipidemia, unspecified: Secondary | ICD-10-CM | POA: Diagnosis not present

## 2022-10-12 DIAGNOSIS — M7550 Bursitis of unspecified shoulder: Secondary | ICD-10-CM | POA: Diagnosis not present

## 2022-10-12 DIAGNOSIS — I872 Venous insufficiency (chronic) (peripheral): Secondary | ICD-10-CM | POA: Diagnosis not present

## 2022-10-12 DIAGNOSIS — G809 Cerebral palsy, unspecified: Secondary | ICD-10-CM | POA: Diagnosis not present

## 2022-10-12 DIAGNOSIS — E785 Hyperlipidemia, unspecified: Secondary | ICD-10-CM | POA: Diagnosis not present

## 2022-10-12 DIAGNOSIS — K5730 Diverticulosis of large intestine without perforation or abscess without bleeding: Secondary | ICD-10-CM | POA: Diagnosis not present

## 2022-10-12 DIAGNOSIS — I10 Essential (primary) hypertension: Secondary | ICD-10-CM | POA: Diagnosis not present

## 2022-10-13 ENCOUNTER — Encounter: Payer: Self-pay | Admitting: Nurse Practitioner

## 2022-10-21 DIAGNOSIS — E785 Hyperlipidemia, unspecified: Secondary | ICD-10-CM | POA: Diagnosis not present

## 2022-10-21 DIAGNOSIS — K5730 Diverticulosis of large intestine without perforation or abscess without bleeding: Secondary | ICD-10-CM | POA: Diagnosis not present

## 2022-10-21 DIAGNOSIS — G809 Cerebral palsy, unspecified: Secondary | ICD-10-CM | POA: Diagnosis not present

## 2022-10-21 DIAGNOSIS — I872 Venous insufficiency (chronic) (peripheral): Secondary | ICD-10-CM | POA: Diagnosis not present

## 2022-10-21 DIAGNOSIS — M7550 Bursitis of unspecified shoulder: Secondary | ICD-10-CM | POA: Diagnosis not present

## 2022-10-21 DIAGNOSIS — I10 Essential (primary) hypertension: Secondary | ICD-10-CM | POA: Diagnosis not present

## 2022-10-27 DIAGNOSIS — E785 Hyperlipidemia, unspecified: Secondary | ICD-10-CM | POA: Diagnosis not present

## 2022-10-27 DIAGNOSIS — I872 Venous insufficiency (chronic) (peripheral): Secondary | ICD-10-CM | POA: Diagnosis not present

## 2022-10-27 DIAGNOSIS — I10 Essential (primary) hypertension: Secondary | ICD-10-CM | POA: Diagnosis not present

## 2022-10-27 DIAGNOSIS — M7550 Bursitis of unspecified shoulder: Secondary | ICD-10-CM | POA: Diagnosis not present

## 2022-10-27 DIAGNOSIS — G809 Cerebral palsy, unspecified: Secondary | ICD-10-CM | POA: Diagnosis not present

## 2022-10-27 DIAGNOSIS — K5730 Diverticulosis of large intestine without perforation or abscess without bleeding: Secondary | ICD-10-CM | POA: Diagnosis not present

## 2022-11-30 ENCOUNTER — Telehealth: Payer: Self-pay | Admitting: Nurse Practitioner

## 2022-11-30 NOTE — Telephone Encounter (Signed)
Okay for verbal order

## 2022-11-30 NOTE — Telephone Encounter (Signed)
Called and LVM giving verbal orders per Santiago Glad.

## 2022-11-30 NOTE — Telephone Encounter (Signed)
Home Health Verbal Orders - Caller/Agency: [facility] Number: [phone] Requesting: Physical Therapy  Frequency: 1x 9

## 2022-12-15 ENCOUNTER — Encounter: Payer: Self-pay | Admitting: *Deleted

## 2023-01-19 IMAGING — MR MR ABDOMEN WO/W CM
18 series · 48 of 48 positions shown · IV contrast (gadavist)
Comparison: CT abdomen and pelvis dated January 30, 2022

CLINICAL DATA: Renal lesion seen on CT

EXAM:
MRI ABDOMEN WITHOUT AND WITH CONTRAST
TECHNIQUE: Multiplanar multisequence MR imaging of the abdomen was performed
both before and after the administration of intravenous contrast.
CONTRAST:  6mL GADAVIST GADOBUTROL 1 MMOL/ML IV SOLN

[Series 3: T2 · coronal · 6.0mm · 1.19mm/px · 2 of 30 slices shown (1 of 2)]
[im 1/30]
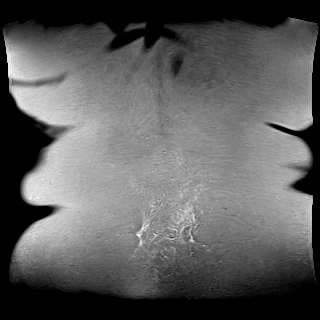
[im 30/30]
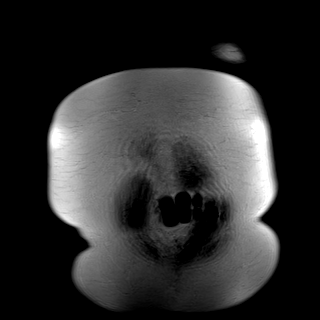

[Series 4: T2 · axial · 6.5mm · 1.19mm/px · z∈[-160,+97]mm · 2 of 34 slices shown (2 of 2)]
[im 1/34]
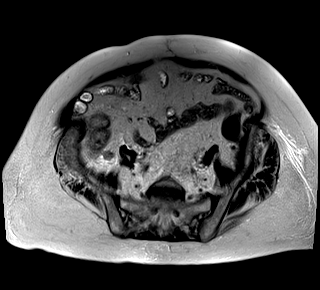
[im 34/34]
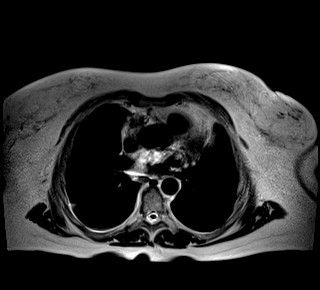

[Series 5: T1 · axial · 3.0mm · 1.19mm/px · z∈[-150,+87]mm · 4 of 80 slices shown (1 of 2)]
[im 1/80]
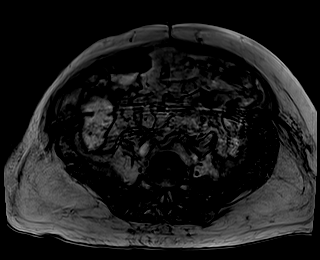
[im 27/80]
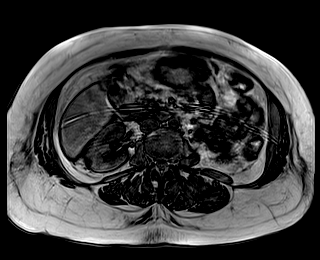
[im 53/80]
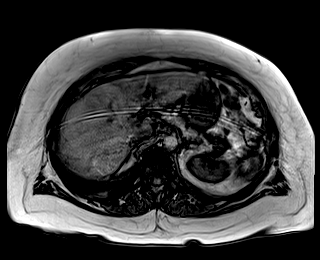
[im 80/80]
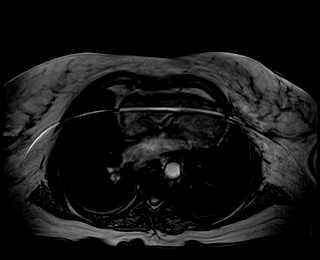

[Series 6: T1 · axial · 3.0mm · 1.19mm/px · z∈[-150,+87]mm · 3 of 80 slices shown (2 of 2)]
[im 1/80]
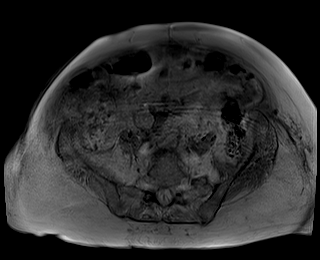
[im 40/80]
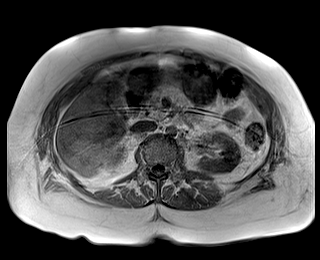
[im 80/80]
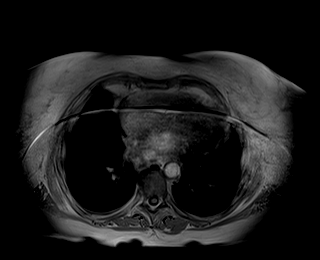

[Series 9: T2 fat-sat · axial · 6.5mm · 1.19mm/px · 1 of 34 slices shown]
[im 1/34]
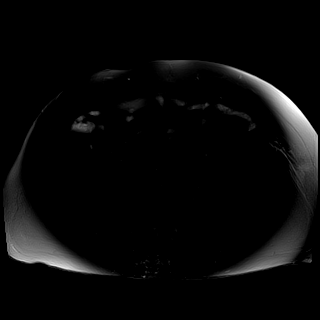

[Series 10: T1 dynamic fat-sat · axial · non-contrast · 3.0mm · 1.19mm/px · z∈[-168,+45]mm · 3 of 72 slices shown (1 of 5)]
[im 1/72]
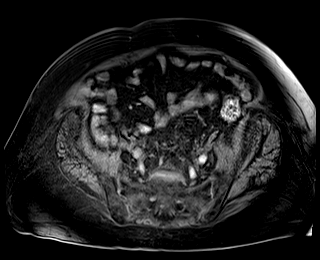
[im 36/72]
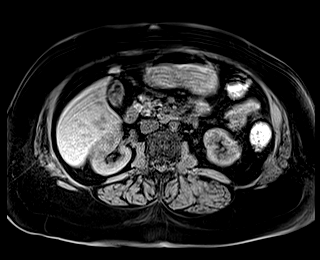
[im 72/72]
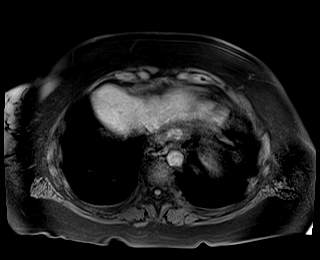

[Series 11: bSSFP · axial · 6.5mm · 0.74mm/px · 1 of 32 slices shown]
[im 1/32]
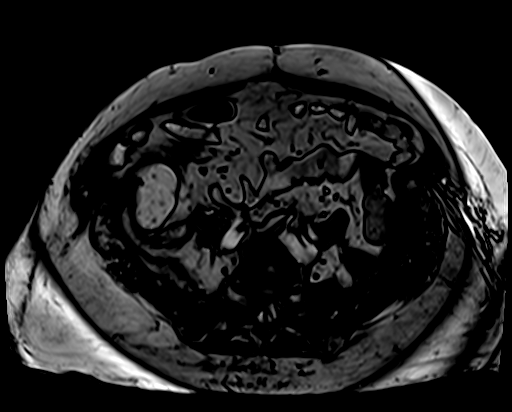

[Series 12: ax dwi_tracew · axial · 6.5mm · 1.42mm/px · z∈[-160,+97]mm · 4 of 102 slices shown]
[im 1/102]
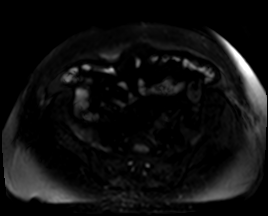
[im 34/102]
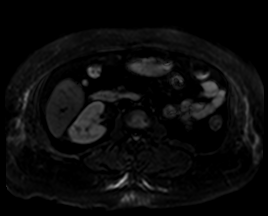
[im 68/102]
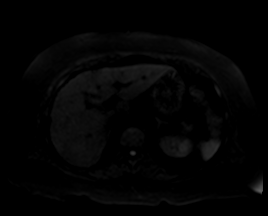
[im 102/102]
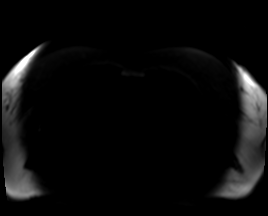

[Series 13: ax dwi_adc · axial · 6.5mm · 1.42mm/px · 1 of 34 slices shown]
[im 1/34]
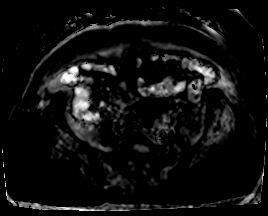

[Series 14: T1 dynamic fat-sat post-contrast · axial · 3.0mm · 1.19mm/px · z∈[-168,+45]mm · 3 of 72 slices shown (1 of 4)]
[im 1/72]
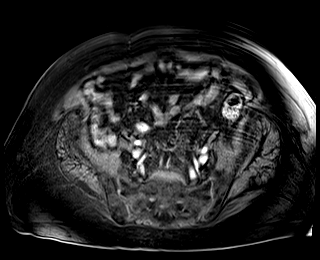
[im 36/72]
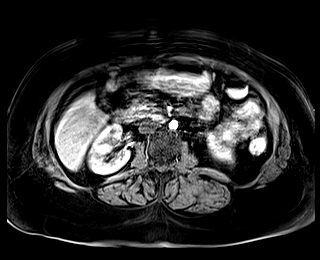
[im 72/72]
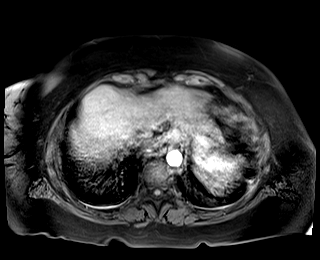

[Series 15: T1 dynamic fat-sat · axial · 3.0mm · 1.19mm/px · z∈[-168,+45]mm · 3 of 72 slices shown (2 of 5)]
[im 1/72]
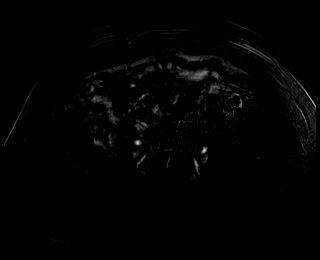
[im 36/72]
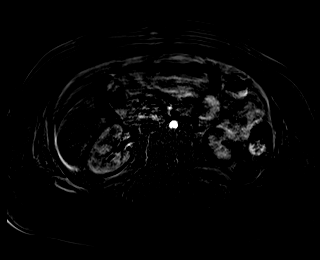
[im 72/72]
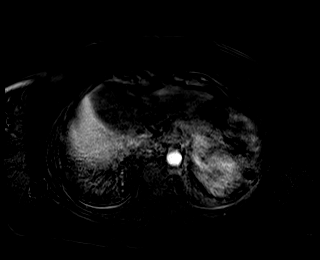

[Series 16: T1 dynamic fat-sat post-contrast · axial · 3.0mm · 1.19mm/px · z∈[-168,+45]mm · 3 of 72 slices shown (2 of 4)]
[im 1/72]
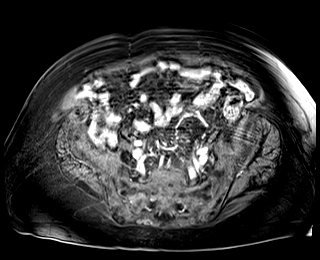
[im 36/72]
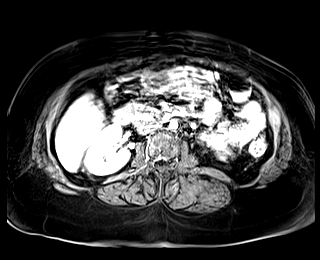
[im 72/72]
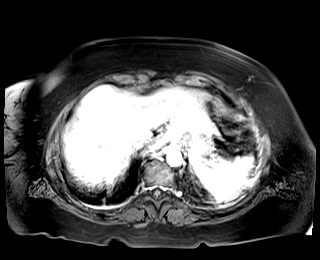

[Series 17: T1 dynamic fat-sat · axial · 3.0mm · 1.19mm/px · z∈[-168,+45]mm · 3 of 72 slices shown (3 of 5)]
[im 1/72]
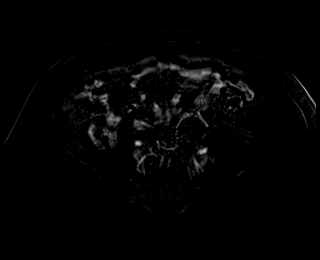
[im 36/72]
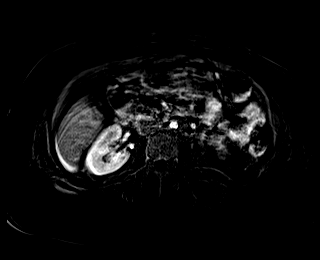
[im 72/72]
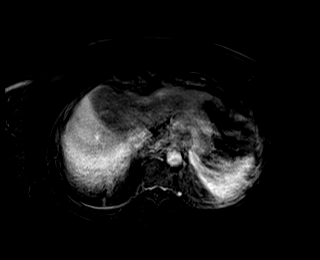

[Series 18: T1 dynamic fat-sat post-contrast · axial · 3.0mm · 1.19mm/px · z∈[-168,+45]mm · 3 of 72 slices shown (3 of 4)]
[im 1/72]
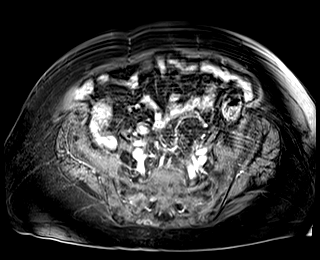
[im 36/72]
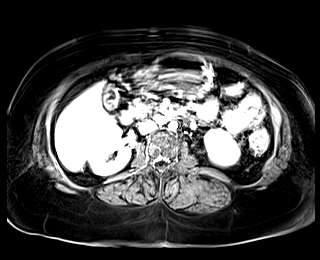
[im 72/72]
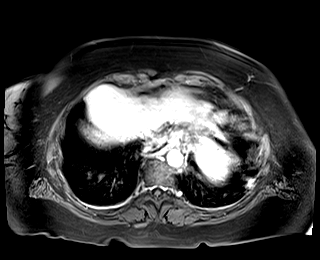

[Series 19: T1 dynamic fat-sat · axial · 3.0mm · 1.19mm/px · z∈[-168,+45]mm · 3 of 72 slices shown (4 of 5)]
[im 1/72]
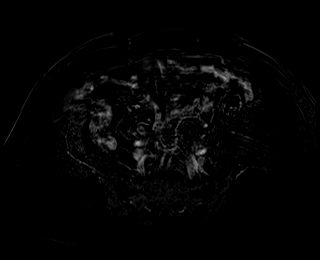
[im 36/72]
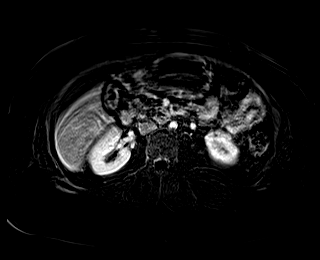
[im 72/72]
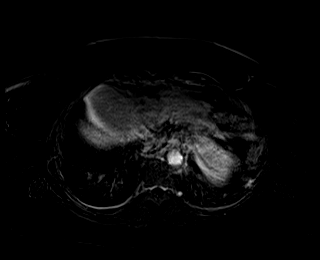

[Series 20: T1 dynamic post-contrast · coronal · 3.0mm · 1.31mm/px · 3 of 72 slices shown]
[im 1/72]
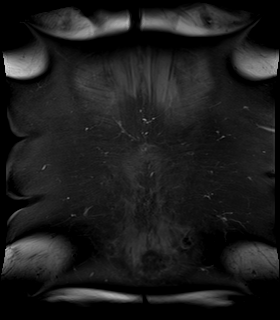
[im 36/72]
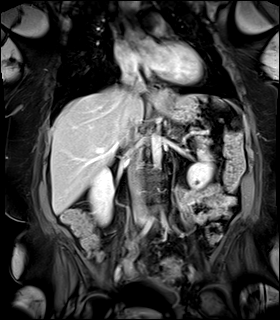
[im 72/72]
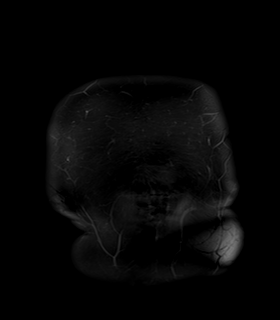

[Series 21: T1 dynamic fat-sat post-contrast · axial · 3.0mm · 1.19mm/px · z∈[-168,+45]mm · 3 of 72 slices shown (4 of 4)]
[im 1/72]
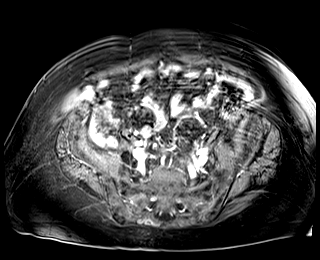
[im 36/72]
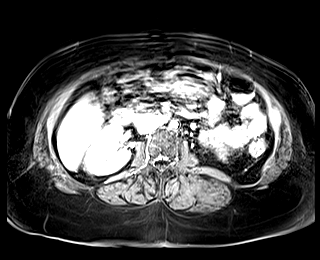
[im 72/72]
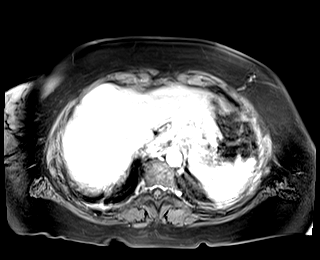

[Series 22: T1 dynamic fat-sat · axial · 3.0mm · 1.19mm/px · z∈[-168,+45]mm · 3 of 72 slices shown (5 of 5)]
[im 1/72]
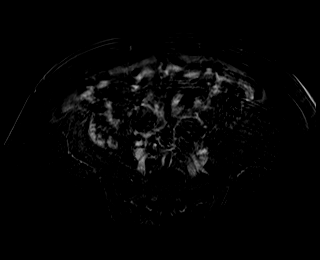
[im 36/72]
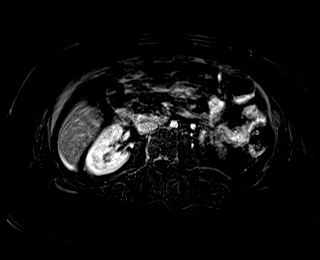
[im 72/72]
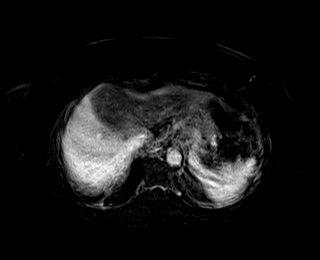

[48 of 48 positions shown; findings below may reference images not displayed]

FINDINGS: Motion artifact somewhat limits evaluation.

Lower chest: No acute findings.

Hepatobiliary: T1 hypointense and T2 hyperintense lesion of segment
V of the liver with possible areas of peripheral nodular
enhancement, evaluation is limited due to motion artifact, although
finding has been present on prior exams dating back to 0292 and is
likely benign. No suspicious mass or other parenchymal abnormality
identified.

Pancreas: No mass, inflammatory changes, or other parenchymal
abnormality identified.

Spleen:  Within normal limits in size and appearance.

Adrenals/Urinary Tract: Bilateral adrenal glands are unremarkable.
Heterogeneous enhancing mass of the interpolar region of the right
kidney measuring 2.3 x 2.0 cm on series 16, image 43. additional
tiny T2 hyperintense lesions are seen in the bilateral kidneys, too
small to completely characterize but likely simple cysts.

Stomach/Bowel: Moderate hiatal hernia visualized bowel demonstrates
mild diverticulosis.

Vascular/Lymphatic: No pathologically enlarged lymph nodes
identified. No abdominal aortic aneurysm demonstrated.

Other:  None.

Musculoskeletal: No suspicious bone lesions identified.
IMPRESSION: Heterogeneous enhancing mass the interpolar region of the right
kidney, compatible with renal cell carcinoma.

## 2023-01-28 ENCOUNTER — Other Ambulatory Visit: Payer: Self-pay | Admitting: Physician Assistant

## 2023-01-28 DIAGNOSIS — G809 Cerebral palsy, unspecified: Secondary | ICD-10-CM

## 2023-01-28 NOTE — Telephone Encounter (Signed)
Requested Prescriptions  Pending Prescriptions Disp Refills   IBU 600 MG tablet [Pharmacy Med Name: IBU 600 MG TABLET] 90 tablet 0    Sig: Take 1 tablet (600 mg total) by mouth every 8 (eight) hours as needed for moderate pain or cramping.     Analgesics:  NSAIDS Failed - 01/28/2023 11:55 AM      Failed - Manual Review: Labs are only required if the patient has taken medication for more than 8 weeks.      Passed - Cr in normal range and within 360 days    Creatinine, Ser  Date Value Ref Range Status  09/30/2022 0.67 0.57 - 1.00 mg/dL Final         Passed - HGB in normal range and within 360 days    Hemoglobin  Date Value Ref Range Status  02/05/2022 13.4 11.1 - 15.9 g/dL Final         Passed - PLT in normal range and within 360 days    Platelets  Date Value Ref Range Status  02/05/2022 403 150 - 450 x10E3/uL Final         Passed - HCT in normal range and within 360 days    Hematocrit  Date Value Ref Range Status  02/05/2022 38.8 34.0 - 46.6 % Final         Passed - eGFR is 30 or above and within 360 days    GFR calc Af Amer  Date Value Ref Range Status  07/11/2020 108 >59 mL/min/1.73 Final    Comment:    **[facility] currently reports eGFR in compliance with the current**   recommendations of the [employer]. [facility] will   update reporting as new guidelines are published from the NKF-ASN   Task force.    GFR, Estimated  Date Value Ref Range Status  02/02/2022 >60 >60 mL/min Final    Comment:    (NOTE) Calculated using the CKD-EPI Creatinine Equation (2021)    eGFR  Date Value Ref Range Status  09/30/2022 98 >59 mL/min/1.73 Final         Passed - Patient is not pregnant      Passed - Valid encounter within last 12 months    Recent Outpatient Visits           4 months ago Diabetes mellitus type 2 with complications ([place])   Byars, NP   7 months ago Cerebral palsy, unspecified type ([place])   Banks, Erin E, PA-C   10 months ago Diabetes mellitus without complication ([place])   [place] [facility] Vigg, Avanti, MD   11 months ago Diverticulitis   [place] Vigg, Avanti, MD   1 year ago Diabetes mellitus without complication [facility])   [place] Vigg, Avanti, MD       Future Appointments             In 2 months Jon Billings, NP [place] [place]   In 4 months Stoioff, Ronda Fairly, MD [place]

## 2023-02-02 ENCOUNTER — Telehealth: Payer: Self-pay | Admitting: Nurse Practitioner

## 2023-02-02 NOTE — Telephone Encounter (Signed)
Called and LVM giving verbal orders per Karen.  

## 2023-02-02 NOTE — Telephone Encounter (Signed)
Okay for verbal orders. 

## 2023-02-02 NOTE — Telephone Encounter (Signed)
Copied from [place] [id number]. Topic: General - Other >> Feb 02, 2023  9:31 AM Ludger Nutting wrote: [place] Verbal Orders - Caller/Agency: [facility] Number: [phone] Requesting OT/PT/Skilled Nursing/Social Work/Speech Therapy: PT Frequency: 1w9

## 2023-02-16 LAB — HM DIABETES EYE EXAM

## 2023-02-19 ENCOUNTER — Encounter: Payer: Self-pay | Admitting: Nurse Practitioner

## 2023-02-19 LAB — HM MAMMOGRAPHY

## 2023-03-01 ENCOUNTER — Other Ambulatory Visit: Payer: Self-pay | Admitting: Nurse Practitioner

## 2023-03-01 NOTE — Telephone Encounter (Signed)
Requested Prescriptions  Pending Prescriptions Disp Refills   hydrochlorothiazide (HYDRODIURIL) 25 MG tablet [Pharmacy Med Name: HYDROCHLOROTHIAZIDE 25 MG TAB] 30 tablet 0    Sig: Take 1 tablet (25 mg total) by mouth daily.     Cardiovascular: Diuretics - Thiazide Passed - 03/01/2023  9:56 AM      Passed - Cr in normal range and within 180 days    Creatinine, Ser  Date Value Ref Range Status  09/30/2022 0.67 0.57 - 1.00 mg/dL Final         Passed - K in normal range and within 180 days    Potassium  Date Value Ref Range Status  09/30/2022 4.2 3.5 - 5.2 mmol/L Final         Passed - Na in normal range and within 180 days    Sodium  Date Value Ref Range Status  09/30/2022 137 134 - 144 mmol/L Final         Passed - Last BP in normal range    BP Readings from Last 1 Encounters:  09/30/22 122/79         Passed - Valid encounter within last 6 months    Recent Outpatient Visits           5 months ago Diabetes mellitus type 2 with complications [facility])   [place] [facility] Larae Grooms, NP   8 months ago Cerebral palsy, unspecified type (HCC)   [place] [facility] Mecum, Erin E, PA-C   12 months ago Diabetes mellitus without complication (HCC)   [place] [facility] Vigg, Avanti, MD   1 year ago Diverticulitis   [place] [facility] Vigg, Avanti, MD   1 year ago Diabetes mellitus without complication [facility])   [place] [facility] Vigg, Avanti, MD       Future Appointments             In 1 month Larae Grooms, NP [facility]   In 3 months Stoioff, Verna Czech, MD [facility] [place]

## 2023-03-10 ENCOUNTER — Ambulatory Visit: Payer: Medicare Other | Admitting: Urology

## 2023-03-31 ENCOUNTER — Encounter: Payer: Self-pay | Admitting: Nurse Practitioner

## 2023-03-31 ENCOUNTER — Ambulatory Visit (INDEPENDENT_AMBULATORY_CARE_PROVIDER_SITE_OTHER): Payer: Medicare Other | Admitting: Nurse Practitioner

## 2023-03-31 VITALS — BP 138/72 | HR 70 | Temp 97.9°F

## 2023-03-31 DIAGNOSIS — G809 Cerebral palsy, unspecified: Secondary | ICD-10-CM | POA: Diagnosis not present

## 2023-03-31 DIAGNOSIS — G801 Spastic diplegic cerebral palsy: Secondary | ICD-10-CM | POA: Diagnosis not present

## 2023-03-31 DIAGNOSIS — M810 Age-related osteoporosis without current pathological fracture: Secondary | ICD-10-CM

## 2023-03-31 DIAGNOSIS — E118 Type 2 diabetes mellitus with unspecified complications: Secondary | ICD-10-CM | POA: Diagnosis not present

## 2023-03-31 DIAGNOSIS — Z23 Encounter for immunization: Secondary | ICD-10-CM

## 2023-03-31 DIAGNOSIS — F419 Anxiety disorder, unspecified: Secondary | ICD-10-CM

## 2023-03-31 DIAGNOSIS — E7800 Pure hypercholesterolemia, unspecified: Secondary | ICD-10-CM

## 2023-03-31 DIAGNOSIS — F339 Major depressive disorder, recurrent, unspecified: Secondary | ICD-10-CM

## 2023-03-31 DIAGNOSIS — I10 Essential (primary) hypertension: Secondary | ICD-10-CM

## 2023-03-31 LAB — MICROALBUMIN, URINE WAIVED
Creatinine, Urine Waived: 50 mg/dL (ref 10–300)
Microalb, Ur Waived: 80 mg/L — ABNORMAL HIGH (ref 0–19)

## 2023-03-31 MED ORDER — BACLOFEN 10 MG PO TABS: ORAL_TABLET | ORAL | 1 refills | Status: DC

## 2023-03-31 MED ORDER — IBUPROFEN 600 MG PO TABS: 600.0000 mg | ORAL_TABLET | Freq: Three times a day (TID) | ORAL | 0 refills | Status: DC | PRN

## 2023-03-31 MED ORDER — FLUOXETINE HCL 40 MG PO CAPS: 40.0000 mg | ORAL_CAPSULE | Freq: Every day | ORAL | 1 refills | Status: DC

## 2023-03-31 MED ORDER — HYDROCHLOROTHIAZIDE 25 MG PO TABS: 25.0000 mg | ORAL_TABLET | Freq: Every day | ORAL | 1 refills | Status: DC

## 2023-03-31 MED ORDER — HYDROXYZINE PAMOATE 25 MG PO CAPS: 25.0000 mg | ORAL_CAPSULE | Freq: Three times a day (TID) | ORAL | 1 refills | Status: DC | PRN

## 2023-03-31 MED ORDER — PANTOPRAZOLE SODIUM 40 MG PO TBEC: 40.0000 mg | DELAYED_RELEASE_TABLET | Freq: Every day | ORAL | 1 refills | Status: DC

## 2023-03-31 MED ORDER — ATORVASTATIN CALCIUM 40 MG PO TABS: 40.0000 mg | ORAL_TABLET | Freq: Every day | ORAL | 1 refills | Status: DC

## 2023-03-31 NOTE — Assessment & Plan Note (Signed)
Chronic.  Not well controlled.  Will increase Fluoxetine to 40mg  daily.  Continue with Hydroxyzine PRN for anxiety.  Follow up in 3 months.  Call sooner if concerns arise.

## 2023-03-31 NOTE — Assessment & Plan Note (Signed)
On Boniva.  Needs updated Dexa Scan in June 2024.

## 2023-03-31 NOTE — Progress Notes (Signed)
BP 138/72   Pulse 70   Temp 97.9 F (36.6 C) (Oral)   SpO2 99%    Subjective:    Patient ID: Michelle Chandler, female    DOB: [birth year], 67 y.o.   MRN: [id number]  HPI: Michelle Chandler is a 67 y.o. female  Chief Complaint  Patient presents with   Hyperlipidemia   Hypertension   Diabetes   HYPERTENSION / HYPERLIPIDEMIA Satisfied with current treatment? yes Duration of hypertension: years BP monitoring frequency: daily BP range: <120/70 BP medication side effects: no Past BP meds: HCTZ Duration of hyperlipidemia: years Cholesterol medication side effects: no Cholesterol supplements: none Past cholesterol medications: atorvastain (lipitor) Medication compliance: excellent compliance Aspirin: no Recent stressors: no Recurrent headaches: no Visual changes: no Palpitations: no Dyspnea: no Chest pain: no Lower extremity edema: no Dizzy/lightheaded: no  DIABETES Hypoglycemic episodes:no Polydipsia/polyuria: no Visual disturbance: no Chest pain: no Paresthesias: no Glucose Monitoring: no  Accucheck frequency: Not Checking  Fasting glucose:  Post prandial:  Evening:  Before meals: Taking Insulin?: no  Long acting insulin:  Short acting insulin: Blood Pressure Monitoring: daily Retinal Examination: up to date Foot Exam: Up to Date Diabetic Education: Not Completed Pneumovax: Up to Date Influenza: Up to Date Aspirin: no  MOOD Patient states she is doing okay.  She does have bad days.  Okay with increase the fluoxetine.  She is using the hydroxyzine PRN for anxiety.   Flowsheet Row Office Visit from 03/31/2023 in [facility]  PHQ-9 Total Score 10         03/31/2023   11:10 AM 09/30/2022   11:19 AM 06/30/2022   11:43 AM 03/05/2022   11:13 AM  GAD 7 : Generalized Anxiety Score  Nervous, Anxious, on Edge 1 2 1 1   Control/stop worrying 1 1 1  0  Worry too much - different things 2 0 1 1  Trouble relaxing 1 1 1 1   Restless 0 0 1 0   Easily annoyed or irritable 2 1 1  0  Afraid - awful might happen 1 0 0 0  Total GAD 7 Score 8 5 6 3   Anxiety Difficulty  Not difficult at all Somewhat difficult Somewhat difficult   Patient states she has been having right sided neck/shoulder pain over the last month.  It has gotten worse over that time.  She hasn't been using the ibuprofen.  But is using her baclofen.   Relevant past medical, surgical, family and social history reviewed and updated as indicated. Interim medical history since our last visit reviewed. Allergies and medications reviewed and updated.  Review of Systems  Eyes:  Negative for visual disturbance.  Respiratory:  Negative for cough, chest tightness and shortness of breath.   Cardiovascular:  Negative for chest pain, palpitations and leg swelling.  Endocrine: Negative for polydipsia and polyuria.  Musculoskeletal:  Positive for neck pain.  Neurological:  Negative for dizziness, numbness and headaches.  Psychiatric/Behavioral:  Positive for dysphoric mood. Negative for suicidal ideas. The patient is nervous/anxious.     Per HPI unless specifically indicated above     Objective:    BP 138/72   Pulse 70   Temp 97.9 F (36.6 C) (Oral)   SpO2 99%   Wt Readings from Last 3 Encounters:  09/30/22 138 lb 4.8 oz (62.7 kg)  06/30/22 149 lb 9.6 oz (67.9 kg)  02/11/22 150 lb (68 kg)    Physical Exam Vitals and nursing note reviewed.  Constitutional:  General: She is not in acute distress.    Appearance: Normal appearance. She is normal weight. She is not ill-appearing, toxic-appearing or diaphoretic.  HENT:     Head: Normocephalic.     Right Ear: External ear normal.     Left Ear: External ear normal.     Nose: Nose normal.     Mouth/Throat:     Mouth: Mucous membranes are moist.     Pharynx: Oropharynx is clear.  Eyes:     General:        Right eye: No discharge.        Left eye: No discharge.     Extraocular Movements: Extraocular movements  intact.     Conjunctiva/sclera: Conjunctivae normal.     Pupils: Pupils are equal, round, and reactive to light.  Cardiovascular:     Rate and Rhythm: Normal rate and regular rhythm.     Heart sounds: No murmur heard. Pulmonary:     Effort: Pulmonary effort is normal. No respiratory distress.     Breath sounds: Normal breath sounds. No wheezing or rales.  Musculoskeletal:     Cervical back: Normal range of motion and neck supple. No edema, erythema, signs of trauma, rigidity, torticollis or crepitus. Pain with movement and muscular tenderness present. No spinous process tenderness. Normal range of motion.     Right lower leg: Edema present.     Left lower leg: Edema present.     Comments: Uses wheel chair  Skin:    General: Skin is warm and dry.     Capillary Refill: Capillary refill takes less than 2 seconds.  Neurological:     General: No focal deficit present.     Mental Status: She is alert and oriented to person, place, and time. Mental status is at baseline.  Psychiatric:        Mood and Affect: Mood normal.        Behavior: Behavior normal.        Thought Content: Thought content normal.        Judgment: Judgment normal.     Results for orders placed or performed in visit on 02/19/23  HM MAMMOGRAPHY  Result Value Ref Range   HM Mammogram 0-4 Bi-Rad 0-4 Bi-Rad, Self Reported Normal      Assessment & Plan:   Problem List Items Addressed This Visit       Cardiovascular and Mediastinum   Essential (primary) hypertension    Chronic.  Controlled.  Continue with current medication regimen of Hydrochlorothiazide daily.  Refills sent today.  Labs ordered today.  Return to clinic in 3 months for reevaluation.  Call sooner if concerns arise.        Relevant Medications   atorvastatin (LIPITOR) 40 MG tablet   hydrochlorothiazide (HYDRODIURIL) 25 MG tablet   Other Relevant Orders   Comp Met (CMET)     Endocrine   Diabetes mellitus type 2 with complications (HCC) -  Primary    Chronic.  Controlled without medication.  A1c has been well controlled at 6.2%.  Labs ordered today.  Return to clinic in 3 months for reevaluation.  Call sooner if concerns arise.        Relevant Medications   atorvastatin (LIPITOR) 40 MG tablet   Other Relevant Orders   HgB A1c   Microalbumin, Urine Waived     Nervous and Auditory   Cerebral palsy (HCC)    Chronic.  Controlled.  Continue with current medication regimen.  Labs ordered today.  Return to clinic in 6 months for reevaluation.  Call sooner if concerns arise.        Relevant Medications   ibuprofen (IBU) 600 MG tablet   baclofen (LIORESAL) 10 MG tablet     Musculoskeletal and Integument   Osteoporosis    On Boniva.  Needs updated Dexa Scan in June 2024.      Relevant Medications   baclofen (LIORESAL) 10 MG tablet     Other   Hyperlipidemia    Chronic.  Controlled.  Continue with current medication regimen of Atorvastatin daily.  Labs ordered today.  Return to clinic in 6 months for reevaluation.  Call sooner if concerns arise.        Relevant Medications   atorvastatin (LIPITOR) 40 MG tablet   baclofen (LIORESAL) 10 MG tablet   hydrochlorothiazide (HYDRODIURIL) 25 MG tablet   Other Relevant Orders   Lipid Profile   Depression, recurrent (HCC)    Chronic.  Not well controlled.  Will increase Fluoxetine to 40mg  daily.  Continue with Hydroxyzine PRN for anxiety.  Follow up in 3 months.  Call sooner if concerns arise.       Relevant Medications   FLUoxetine (PROZAC) 40 MG capsule   hydrOXYzine (VISTARIL) 25 MG capsule   Anxiety    Chronic.  Not well controlled.  Will increase Fluoxetine to 40mg  daily.  Continue with Hydroxyzine PRN for anxiety.  Follow up in 3 months.  Call sooner if concerns arise.       Relevant Medications   FLUoxetine (PROZAC) 40 MG capsule   hydrOXYzine (VISTARIL) 25 MG capsule   Other Visit Diagnoses     Need for pneumococcal 20-valent conjugate vaccination        Relevant Orders   Pneumococcal conjugate vaccine 20-valent (Prevnar 20) (Completed)        Follow up plan: Return in about 3 months (around 07/01/2023) for HTN, HLD, DM2 FU.

## 2023-03-31 NOTE — Assessment & Plan Note (Signed)
Chronic.  Controlled without medication.  A1c has been well controlled at 6.2%.  Labs ordered today.  Return to clinic in 3 months for reevaluation.  Call sooner if concerns arise.

## 2023-03-31 NOTE — Assessment & Plan Note (Signed)
Chronic.  Controlled.  Continue with current medication regimen of Hydrochlorothiazide daily.  Refills sent today.  Labs ordered today.  Return to clinic in 3 months for reevaluation.  Call sooner if concerns arise.

## 2023-03-31 NOTE — Assessment & Plan Note (Signed)
Chronic.  Controlled.  Continue with current medication regimen.  Labs ordered today.  Return to clinic in 6 months for reevaluation.  Call sooner if concerns arise.  ? ?

## 2023-03-31 NOTE — Assessment & Plan Note (Signed)
Chronic.  Not well controlled.  Will increase Fluoxetine to 40mg daily.  Continue with Hydroxyzine PRN for anxiety.  Follow up in 3 months.  Call sooner if concerns arise.  

## 2023-03-31 NOTE — Assessment & Plan Note (Signed)
Chronic.  Controlled.  Continue with current medication regimen of Atorvastatin daily.  Labs ordered today.  Return to clinic in 6 months for reevaluation.  Call sooner if concerns arise.   

## 2023-04-01 ENCOUNTER — Encounter: Payer: Self-pay | Admitting: Nurse Practitioner

## 2023-04-01 LAB — COMPREHENSIVE METABOLIC PANEL
ALT: 13 IU/L (ref 0–32)
AST: 15 IU/L (ref 0–40)
Albumin/Globulin Ratio: 1.6 (ref 1.2–2.2)
Albumin: 4.4 g/dL (ref 3.9–4.9)
Alkaline Phosphatase: 141 IU/L — ABNORMAL HIGH (ref 44–121)
BUN/Creatinine Ratio: 15 (ref 12–28)
BUN: 11 mg/dL (ref 8–27)
Bilirubin Total: 0.3 mg/dL (ref 0.0–1.2)
CO2: 26 mmol/L (ref 20–29)
Calcium: 10.3 mg/dL (ref 8.7–10.3)
Chloride: 97 mmol/L (ref 96–106)
Creatinine, Ser: 0.75 mg/dL (ref 0.57–1.00)
Globulin, Total: 2.8 g/dL (ref 1.5–4.5)
Glucose: 107 mg/dL — ABNORMAL HIGH (ref 70–99)
Potassium: 3.9 mmol/L (ref 3.5–5.2)
Sodium: 137 mmol/L (ref 134–144)
Total Protein: 7.2 g/dL (ref 6.0–8.5)
eGFR: 88 mL/min/{1.73_m2} (ref >59)

## 2023-04-01 LAB — LIPID PANEL
Chol/HDL Ratio: 4.3 ratio (ref 0.0–4.4)
Cholesterol, Total: 198 mg/dL (ref 100–199)
HDL: 46 mg/dL (ref >39)
LDL Chol Calc (NIH): 107 mg/dL — ABNORMAL HIGH (ref 0–99)
Triglycerides: 263 mg/dL — ABNORMAL HIGH (ref 0–149)
VLDL Cholesterol Cal: 45 mg/dL — ABNORMAL HIGH (ref 5–40)

## 2023-04-01 LAB — HEMOGLOBIN A1C
Est. average glucose Bld gHb Est-mCnc: 140 mg/dL
Hgb A1c MFr Bld: 6.5 % — ABNORMAL HIGH (ref 4.8–5.6)

## 2023-04-01 NOTE — Progress Notes (Signed)
Please let patient know that her lab work looks good.  A1c is well controlled at 6.5%.  Triglycerides did increase from prior.  I recommend decreasing processed foods and refined sugar intake.  No other concerns at this time.  Continue with current medication regimen.  Follow up as discussed.

## 2023-04-02 ENCOUNTER — Telehealth: Payer: Self-pay | Admitting: Nurse Practitioner

## 2023-04-02 NOTE — Telephone Encounter (Signed)
Called and LVM giving verbal orders per Karen.  

## 2023-04-02 NOTE — Telephone Encounter (Signed)
Copied from CRM [id number]. Topic: Quick Communication - Home Health Verbal Orders >> Apr 02, 2023  8:06 AM Everette C wrote: Caller/Agency: Cindy/ [facility]  Callback Number:  [phone] Requesting OT/PT/Skilled Nursing/Social Work/Speech Therapy: PT Frequency: 1w8

## 2023-04-02 NOTE — Telephone Encounter (Signed)
Okay to give verbal order.

## 2023-05-14 ENCOUNTER — Telehealth: Payer: Self-pay | Admitting: Nurse Practitioner

## 2023-05-14 NOTE — Telephone Encounter (Signed)
Order refaxed

## 2023-05-14 NOTE — Telephone Encounter (Signed)
Copied from CRM [id number]. Topic: General - Other >> May 14, 2023  8:37 AM Clide Dales wrote: Fritzi Mandes with [employer] called to say that the supply order for patient did not come through completely on fax and would like to know if it can be sent again. Fax [phone]

## 2023-05-25 ENCOUNTER — Ambulatory Visit
Admission: RE | Admit: 2023-05-25 | Discharge: 2023-05-25 | Disposition: A | Discharge status: Home / Self Care | Payer: Medicare Other | Source: Ambulatory Visit | Attending: Urology | Admitting: Urology

## 2023-05-25 DIAGNOSIS — N2889 Other specified disorders of kidney and ureter: Secondary | ICD-10-CM | POA: Insufficient documentation

## 2023-05-25 LAB — POCT I-STAT CREATININE: Creatinine, Ser: 0.7 mg/dL (ref 0.44–1.00)

## 2023-05-25 MED ORDER — IOHEXOL 300 MG/ML  SOLN
100.0000 mL | Freq: Once | INTRAMUSCULAR | Status: AC | PRN
  Administered 2023-05-25: 100 mL via INTRAVENOUS

## 2023-06-01 ENCOUNTER — Telehealth: Payer: Self-pay | Admitting: Nurse Practitioner

## 2023-06-01 NOTE — Telephone Encounter (Signed)
Left message for Arline Asp from [facility] to provide the verbal OK for orders per Jacquelin Hawking, PA. Arliss Journey to give our office a call back if she has any questions.

## 2023-06-01 NOTE — Telephone Encounter (Signed)
Home Health Verbal Orders - Caller/Agency: Cindy from [facility] Callback Number:  [phone] Requesting PT Frequency: 1 x9

## 2023-06-02 ENCOUNTER — Encounter: Payer: Self-pay | Admitting: Urology

## 2023-06-02 ENCOUNTER — Ambulatory Visit (INDEPENDENT_AMBULATORY_CARE_PROVIDER_SITE_OTHER): Payer: Medicare Other | Admitting: Urology

## 2023-06-02 VITALS — BP 131/76 | HR 79 | Ht 60.0 in | Wt 160.0 lb

## 2023-06-02 DIAGNOSIS — N2889 Other specified disorders of kidney and ureter: Secondary | ICD-10-CM | POA: Diagnosis not present

## 2023-06-02 NOTE — Progress Notes (Signed)
I, Duke Salvia, acting as a Neurosurgeon for Riki Altes, MD., have documented all relevant documentation on the behalf of Riki Altes, MD, as directed by  Riki Altes, MD while in the presence of Riki Altes, MD.   06/02/2023 12:28 PM   Michelle Chandler [birth year] [id number]  Referring provider: Practice, [facility] [address] [place],  [place] [address]  Chief Complaint  Patient presents with   Results    HPI: Michelle Chandler is a 67 y.o. female who presents for 9 month follow up visit.   Incidentally found to have a 2.3x2 cm enhancing right renal mass on a CT abdomen performed 01/30/22 for E. coli sepsis. A renal mass protocol performed during that hospitalization showed a 2.3x2 cm enhancing right renal mass. After discussing managment options, she elected surveillance and a follow up renal mass protocol MRI of the abdomen performed 09/04/22 showed a stable right renal mass measuring 2.4x2 cm. 9 month follow up imaging was recommended and a renal mass CT of the abdomen was performed 05/25/23 showed a slight increase, measured at 2.6x2.4 cm. She was also noted to have a 15 mm lesion in the left lateral upper pole, felt to be indeterminate. Doing well since last visit. No bothersome LUTS. Denies dysuria, gross hematuria. Denies flank, abdominal or pelvic pain.   PMH: Past Medical History:  Diagnosis Date   Advance care planning 06/28/2019   Allergic rhinitis    Ataxia    Bursitis of shoulder 11/20/2015   Overview:  Last Assessment & Plan:  Discussed care and treatment shoulder bursitis with meloxicam   CP (cerebral palsy) (HCC)    Depression    Diverticulitis    Diverticulosis    Fatigue 02/05/2020   Hyperlipidemia    Hypertension    IFG (impaired fasting glucose)    Venous insufficiency    Venous stasis ulcer (HCC)    Vertigo     Surgical History: Past Surgical History:  Procedure Laterality Date   ABDOMINAL HYSTERECTOMY     complete   LAPAROSCOPY     legs and  hip     due to CP    Home Medications:  Allergies as of 06/02/2023   No Known Allergies      Medication List        Accurate as of June 02, 2023 12:28 PM. If you have any questions, ask your nurse or doctor.          atorvastatin 40 MG tablet Commonly known as: LIPITOR Take 1 tablet (40 mg total) by mouth daily.   baclofen 10 MG tablet Commonly known as: LIORESAL Take 1 tablet (10 mg total) by mouth every evening.   diphenhydrAMINE 25 MG tablet Commonly known as: BENADRYL Take 25 mg by mouth every 6 (six) hours as needed for itching or allergies.   fluorometholone 0.1 % ophthalmic suspension Commonly known as: FML SMARTSIG:In Eye(s)   FLUoxetine 40 MG capsule Commonly known as: PROZAC Take 1 capsule (40 mg total) by mouth daily.   hydrochlorothiazide 25 MG tablet Commonly known as: HYDRODIURIL Take 1 tablet (25 mg total) by mouth daily.   hydrOXYzine 25 MG capsule Commonly known as: VISTARIL Take 1 capsule (25 mg total) by mouth every 8 (eight) hours as needed.   ibandronate 150 MG tablet Commonly known as: Boniva Take 1 tablet (150 mg total) by mouth every 30 (thirty) days. Take in the morning with a full glass of water, on an empty stomach, and  do not take anything else by mouth or lie down for the next 30 min.   ibuprofen 600 MG tablet Commonly known as: IBU Take 1 tablet (600 mg total) by mouth every 8 (eight) hours as needed.   loperamide 2 MG capsule Commonly known as: IMODIUM Take 1 capsule (2 mg total) by mouth every 8 (eight) hours as needed for diarrhea or loose stools.   LORazepam 1 MG tablet Commonly known as: ATIVAN   mupirocin ointment 2 % Commonly known as: BACTROBAN   pantoprazole 40 MG tablet Commonly known as: Protonix Take 1 tablet (40 mg total) by mouth daily.   Protective Underwear Large Misc by Does not apply route.   Entrust Plus Disp Underpads Misc by Does not apply route.   Ventolin HFA 108 (90 Base) MCG/ACT  inhaler Generic drug: albuterol SMARTSIG:2 Puff(s) By Mouth Every 4 Hours PRN   Voltaren 1 % Gel Generic drug: diclofenac Sodium        Allergies: No Known Allergies  Family History: Family History  Problem Relation Age of Onset   Diabetes Mother    Diabetes Father    Heart disease Maternal Uncle 45   Kidney disease Maternal Uncle     Social History:  reports that she has been smoking cigarettes. She has never used smokeless tobacco. She reports that she does not drink alcohol and does not use drugs.   Physical Exam: BP 131/76   Pulse 79   Ht 5' (1.524 m)   Wt 160 lb (72.6 kg)   BMI 31.25 kg/m   Constitutional:  Alert and oriented, No acute distress. HEENT: [place] AT, moist mucus membranes.  Trachea midline, no masses. Respiratory: Normal respiratory effort, no increased work of breathing. Psychiatric: Normal mood and affect.   Assessment & Plan:    1. Right renal mass A slight interval size increase of 2x4 mm. We again discuss options of continued surveillance, evaluation by IR to see if amenable to percutaneous cryoablation and surgical removal. She desires to continue surveillance and since there has been slight interval growth we'll schedule a renal mass protocol MRI in 6 months. A 15 mm left indeterminate lesion noted and we'll reassess with MRI in 6 months.  I have reviewed the above documentation for accuracy and completeness, and I agree with the above.   Riki Altes, MD  [facility] [address], [address] [place], [place] [address] [phone]

## 2023-07-01 ENCOUNTER — Ambulatory Visit: Payer: Medicare Other | Admitting: Physician Assistant

## 2023-07-30 ENCOUNTER — Telehealth: Payer: Self-pay | Admitting: Nurse Practitioner

## 2023-07-30 NOTE — Telephone Encounter (Signed)
Left message for [facility] with [facility] providing verbal OK orders per Jacquelin Hawking, PA. Advised to give our office a call back with any questions or concerns regarding voice message.

## 2023-07-30 NOTE — Telephone Encounter (Signed)
Home Health Verbal Orders - Caller/Agency: Arline Asp with [facility] Callback Number: [phone] Requesting : PT Frequency: 2X3 1X5

## 2023-09-28 ENCOUNTER — Telehealth: Payer: Self-pay | Admitting: Nurse Practitioner

## 2023-09-28 NOTE — Telephone Encounter (Unsigned)
Copied from CRM [id number]. Topic: Quick Communication - Home Health Verbal Orders >> Sep 28, 2023  3:07 PM Macon Large wrote: Caller/Agency: Arline Asp with [facility] Number: [phone] Service Requested: Physical Therapy Frequency: 1 x 9 Any new concerns about the patient? No

## 2023-09-28 NOTE — Telephone Encounter (Signed)
Okay 

## 2023-09-29 NOTE — Telephone Encounter (Signed)
Left message for Arline Asp with [facility] to provide verbal OK orders for patient. Advised to give our office a call back if she has any questions.

## 2023-10-21 ENCOUNTER — Ambulatory Visit (INDEPENDENT_AMBULATORY_CARE_PROVIDER_SITE_OTHER): Payer: Medicare Other | Admitting: Family Medicine

## 2023-10-21 VITALS — BP 137/85 | HR 80 | Temp 97.6°F

## 2023-10-21 DIAGNOSIS — I10 Essential (primary) hypertension: Secondary | ICD-10-CM | POA: Diagnosis not present

## 2023-10-21 DIAGNOSIS — E785 Hyperlipidemia, unspecified: Secondary | ICD-10-CM | POA: Diagnosis not present

## 2023-10-21 DIAGNOSIS — G809 Cerebral palsy, unspecified: Secondary | ICD-10-CM | POA: Diagnosis not present

## 2023-10-21 DIAGNOSIS — R7309 Other abnormal glucose: Secondary | ICD-10-CM

## 2023-10-21 DIAGNOSIS — E7800 Pure hypercholesterolemia, unspecified: Secondary | ICD-10-CM

## 2023-10-21 DIAGNOSIS — G801 Spastic diplegic cerebral palsy: Secondary | ICD-10-CM

## 2023-10-21 DIAGNOSIS — M810 Age-related osteoporosis without current pathological fracture: Secondary | ICD-10-CM

## 2023-10-21 DIAGNOSIS — R7303 Prediabetes: Secondary | ICD-10-CM

## 2023-10-21 DIAGNOSIS — F339 Major depressive disorder, recurrent, unspecified: Secondary | ICD-10-CM

## 2023-10-21 DIAGNOSIS — F419 Anxiety disorder, unspecified: Secondary | ICD-10-CM

## 2023-10-21 DIAGNOSIS — F418 Other specified anxiety disorders: Secondary | ICD-10-CM

## 2023-10-21 DIAGNOSIS — K219 Gastro-esophageal reflux disease without esophagitis: Secondary | ICD-10-CM

## 2023-10-21 MED ORDER — BACLOFEN 15 MG PO TABS: ORAL_TABLET | ORAL | 0 refills | Status: DC

## 2023-10-21 MED ORDER — ATORVASTATIN CALCIUM 40 MG PO TABS: 40.0000 mg | ORAL_TABLET | Freq: Every day | ORAL | 1 refills | Status: DC

## 2023-10-21 MED ORDER — HYDROXYZINE PAMOATE 50 MG PO CAPS: 50.0000 mg | ORAL_CAPSULE | Freq: Three times a day (TID) | ORAL | 0 refills | Status: DC | PRN

## 2023-10-21 MED ORDER — IBUPROFEN 600 MG PO TABS: 600.0000 mg | ORAL_TABLET | Freq: Three times a day (TID) | ORAL | 0 refills | Status: DC | PRN

## 2023-10-21 MED ORDER — FLUOXETINE HCL 40 MG PO CAPS: 40.0000 mg | ORAL_CAPSULE | Freq: Every day | ORAL | 1 refills | Status: DC

## 2023-10-21 MED ORDER — PANTOPRAZOLE SODIUM 40 MG PO TBEC: 40.0000 mg | DELAYED_RELEASE_TABLET | Freq: Every day | ORAL | 1 refills | Status: DC

## 2023-10-21 MED ORDER — HYDROCHLOROTHIAZIDE 25 MG PO TABS: 25.0000 mg | ORAL_TABLET | Freq: Every day | ORAL | 1 refills | Status: DC

## 2023-10-21 NOTE — Progress Notes (Unsigned)
BP 137/85   Pulse 80   Temp 97.6 F (36.4 C) (Oral)   SpO2 94%    Subjective:    Patient ID: Michelle Chandler, female    DOB: [birth year], 67 y.o.   MRN: [id number]  HPI: Michelle Chandler is a 67 y.o. female  Chief Complaint  Patient presents with   Medication Refill   HYPERTENSION / HYPERLIPIDEMIA Taking Hydrochlorothiazide 25 MG daily, Atorvastatin 40 MG daily  Satisfied with current treatment? yes Duration of hypertension: years BP monitoring frequency: weekly  BP range: 132/80 BP medication side effects: no Past BP meds: HCTZ Duration of hyperlipidemia: years Cholesterol medication side effects: no Cholesterol supplements: none Past cholesterol medications: atorvastain (lipitor) Medication compliance: excellent compliance Aspirin: no Recent stressors: no Recurrent headaches: no Visual changes: no Palpitations: no Dyspnea: no Chest pain: no Lower extremity edema: Yes Dizzy/lightheaded: no  ANXIETY/STRESS She is taking Fluoxetine 40 MG and Hydroxyzine 25 MG as needed nightly  Duration:worse Anxious mood: yes  Excessive worrying: yes Irritability: yes  Sweating: no Nausea: no Palpitations:no Hyperventilation: no Panic attacks: yes Agoraphobia: yes  Obscessions/compulsions: no Depressed mood: yes    10/21/2023   11:37 AM 03/31/2023   11:10 AM 09/30/2022   11:19 AM 08/03/2022   11:42 AM 06/30/2022   11:43 AM  Depression screen PHQ 2/9  Decreased Interest 2 2 1 1 1   Down, Depressed, Hopeless 3 2 2 1 1   PHQ - 2 Score 5 4 3 2 2   Altered sleeping 2 1 3 1 1   Tired, decreased energy 2 1 1 1 1   Change in appetite 2 2 1 1 1   Feeling bad or failure about yourself  2 2 1 1 1   Trouble concentrating 0 0 1 2 1   Moving slowly or fidgety/restless 0 0 1 0 1  Suicidal thoughts 0 0 0 0 0  PHQ-9 Score 13 10 11 8 8   Difficult doing work/chores Somewhat difficult  Somewhat difficult Somewhat difficult Somewhat difficult      10/21/2023   11:39 AM 03/31/2023   11:10 AM  09/30/2022   11:19 AM 06/30/2022   11:43 AM  GAD 7 : Generalized Anxiety Score  Nervous, Anxious, on Edge 3 1 2 1   Control/stop worrying 3 1 1 1   Worry too much - different things 2 2 0 1  Trouble relaxing 1 1 1 1   Restless 2 0 0 1  Easily annoyed or irritable 3 2 1 1   Afraid - awful might happen 0 1 0 0  Total GAD 7 Score 14 8 5 6   Anxiety Difficulty Somewhat difficult  Not difficult at all Somewhat difficult     Anhedonia: yes Weight changes: no Insomnia: no hard to fall asleep  Fatigue/loss of energy: yes Feelings of worthlessness: no Feelings of guilt: no Impaired concentration/indecisiveness: yes Suicidal ideations: no  Crying spells: no Recent Stressors/Life Changes: no   Relationship problems: no   Family stress: yes     Financial stress: no    Recent death/loss: no   DIABETES Hypoglycemic episodes:no Polydipsia/polyuria: no Visual disturbance: no Chest pain: no Paresthesias: no Glucose Monitoring: no  Accucheck frequency: Not Checking Taking Insulin?: no Blood Pressure Monitoring: daily Retinal Examination: Not up to Date Foot Exam: Up to Date Diabetic Education: Not Completed Pneumovax: Up to Date Influenza: Up to Date Aspirin: no  GERD Taking Protonix 40 MG daily  GERD control status: stableSatisfied with current treatment? yes Heartburn frequency: weekly  Medication side effects: no  Medication compliance: stable  Antacid use frequency:  None Heartburn duration: chronic Alleviatiating factors:   Aggravating factors: spicy foods Dysphagia: no Odynophagia:  no Hematemesis: no Blood in stool: no EGD: no   Relevant past medical, surgical, family and social history reviewed and updated as indicated. Interim medical history since our last visit reviewed. Allergies and medications reviewed and updated.  Review of Systems  Eyes:  Negative for visual disturbance.  Respiratory:  Negative for cough, chest tightness and shortness of breath.    Cardiovascular:  Positive for leg swelling. Negative for chest pain and palpitations.  Gastrointestinal:  Negative for constipation, diarrhea, nausea and vomiting.  Endocrine: Negative for polydipsia and polyuria.  Neurological:  Negative for dizziness, numbness and headaches.  Psychiatric/Behavioral:  Positive for agitation. Negative for confusion, decreased concentration, dysphoric mood, sleep disturbance and suicidal ideas. The patient is nervous/anxious.     Per HPI unless specifically indicated above     Objective:    BP 137/85   Pulse 80   Temp 97.6 F (36.4 C) (Oral)   SpO2 94%   Wt Readings from Last 3 Encounters:  06/02/23 160 lb (72.6 kg)  09/30/22 138 lb 4.8 oz (62.7 kg)  06/30/22 149 lb 9.6 oz (67.9 kg)    Physical Exam Vitals and nursing note reviewed.  Constitutional:      General: She is not in acute distress.    Appearance: Normal appearance. She is normal weight. She is not ill-appearing, toxic-appearing or diaphoretic.  HENT:     Head: Normocephalic.     Right Ear: External ear normal.     Left Ear: External ear normal.     Nose: Congestion present. No rhinorrhea.     Mouth/Throat:     Mouth: Mucous membranes are moist.     Pharynx: Oropharynx is clear.  Eyes:     General:        Right eye: No discharge.        Left eye: No discharge.     Extraocular Movements: Extraocular movements intact.     Conjunctiva/sclera: Conjunctivae normal.     Pupils: Pupils are equal, round, and reactive to light.  Cardiovascular:     Rate and Rhythm: Normal rate and regular rhythm.     Pulses:          Radial pulses are 2+ on the right side and 2+ on the left side.       Posterior tibial pulses are 2+ on the right side and 2+ on the left side.     Heart sounds: No murmur heard. Pulmonary:     Effort: Pulmonary effort is normal. No respiratory distress.     Breath sounds: Normal breath sounds. No wheezing, rhonchi or rales.  Musculoskeletal:     Cervical back:  Normal range of motion and neck supple.     Right lower leg: Edema present.     Left lower leg: Edema present.     Comments: Uses wheel chair  Skin:    General: Skin is warm and dry.     Capillary Refill: Capillary refill takes less than 2 seconds.  Neurological:     General: No focal deficit present.     Mental Status: She is alert and oriented to person, place, and time. Mental status is at baseline.  Psychiatric:        Mood and Affect: Mood normal.        Behavior: Behavior normal.        Thought Content: Thought content normal.  Judgment: Judgment normal.     Results for orders placed or performed in visit on 10/21/23  HgB A1c  Result Value Ref Range   Hgb A1c MFr Bld 6.6 (H) 4.8 - 5.6 %   Est. average glucose Bld gHb Est-mCnc 143 mg/dL  Comp Met (CMET)  Result Value Ref Range   Glucose 105 (H) 70 - 99 mg/dL   BUN 12 8 - 27 mg/dL   Creatinine, Ser 8.65 0.57 - 1.00 mg/dL   eGFR 83 >78 IO/NGE/9.52   BUN/Creatinine Ratio 15 12 - 28   Sodium 140 134 - 144 mmol/L   Potassium 3.5 3.5 - 5.2 mmol/L   Chloride 96 96 - 106 mmol/L   CO2 28 20 - 29 mmol/L   Calcium 10.5 (H) 8.7 - 10.3 mg/dL   Total Protein 7.0 6.0 - 8.5 g/dL   Albumin 4.2 3.9 - 4.9 g/dL   Globulin, Total 2.8 1.5 - 4.5 g/dL   Bilirubin Total 0.4 0.0 - 1.2 mg/dL   Alkaline Phosphatase 144 (H) 44 - 121 IU/L   AST 13 0 - 40 IU/L   ALT 14 0 - 32 IU/L      Assessment & Plan:   Problem List Items Addressed This Visit     Cerebral palsy (HCC)   Relevant Medications   baclofen 15 MG TABS   ibuprofen (IBU) 600 MG tablet   Hyperlipidemia   Relevant Medications   atorvastatin (LIPITOR) 40 MG tablet   baclofen 15 MG TABS   hydrochlorothiazide (HYDRODIURIL) 25 MG tablet   Other Relevant Orders   Lipid panel   Essential (primary) hypertension - Primary   Relevant Medications   atorvastatin (LIPITOR) 40 MG tablet   hydrochlorothiazide (HYDRODIURIL) 25 MG tablet   Other Relevant Orders   Comp Met (CMET)  (Completed)   Depression, recurrent (HCC)   Relevant Medications   FLUoxetine (PROZAC) 40 MG capsule   hydrOXYzine (VISTARIL) 50 MG capsule   Gastroesophageal reflux disease   Relevant Medications   pantoprazole (PROTONIX) 40 MG tablet   Anxiety   Relevant Medications   FLUoxetine (PROZAC) 40 MG capsule   hydrOXYzine (VISTARIL) 50 MG capsule   Osteoporosis   Relevant Medications   baclofen 15 MG TABS   Other Visit Diagnoses     Elevated glucose level       Relevant Orders   HgB A1c (Completed)         Follow up plan: Return in about 1 month (around 11/21/2023) for Follow up, physical.

## 2023-10-22 LAB — COMPREHENSIVE METABOLIC PANEL
ALT: 14 [IU]/L (ref 0–32)
AST: 13 [IU]/L (ref 0–40)
Albumin: 4.2 g/dL (ref 3.9–4.9)
Alkaline Phosphatase: 144 [IU]/L — ABNORMAL HIGH (ref 44–121)
BUN/Creatinine Ratio: 15 (ref 12–28)
BUN: 12 mg/dL (ref 8–27)
Bilirubin Total: 0.4 mg/dL (ref 0.0–1.2)
CO2: 28 mmol/L (ref 20–29)
Calcium: 10.5 mg/dL — ABNORMAL HIGH (ref 8.7–10.3)
Chloride: 96 mmol/L (ref 96–106)
Creatinine, Ser: 0.79 mg/dL (ref 0.57–1.00)
Globulin, Total: 2.8 g/dL (ref 1.5–4.5)
Glucose: 105 mg/dL — ABNORMAL HIGH (ref 70–99)
Potassium: 3.5 mmol/L (ref 3.5–5.2)
Sodium: 140 mmol/L (ref 134–144)
Total Protein: 7 g/dL (ref 6.0–8.5)
eGFR: 83 mL/min/{1.73_m2} (ref >59)

## 2023-10-22 LAB — HEMOGLOBIN A1C
Est. average glucose Bld gHb Est-mCnc: 143 mg/dL
Hgb A1c MFr Bld: 6.6 % — ABNORMAL HIGH (ref 4.8–5.6)

## 2023-10-24 DIAGNOSIS — F418 Other specified anxiety disorders: Secondary | ICD-10-CM | POA: Insufficient documentation

## 2023-10-24 NOTE — Assessment & Plan Note (Addendum)
Acute, ongoing. A1C done today. Previous A1C 6.5%. Not currently on medication. Currently controlled by diet. Exercise is limited.

## 2023-10-24 NOTE — Assessment & Plan Note (Signed)
Chronic, unstable. PHQ 9:13, GAD 7: 14. Continue Fluoxetine 40 MG daily, will increase Hydroxyzine 25 to 50 MG as needed. Return in 1 month, call sooner if concerns arise.

## 2023-10-24 NOTE — Assessment & Plan Note (Signed)
Chronic, stable. Continue Pantoprazole 40 MG daily, refills given.

## 2023-10-24 NOTE — Assessment & Plan Note (Signed)
Chronic, stable. Lipid panel done today. Continue Atorvastatin 40 MG daily, refills given. Return in 6 months.

## 2023-10-24 NOTE — Assessment & Plan Note (Addendum)
Chronic, stable. CMP done today. Continue Hydrochlorothiazide 25 MG daily, refills given. Return in 3 months.

## 2023-11-02 ENCOUNTER — Telehealth: Payer: Self-pay

## 2023-11-02 NOTE — Telephone Encounter (Signed)
Called patient and she stated that she is okay with appointment on 11/23/2023 @ 11:00 am to receive these supplies.

## 2023-11-02 NOTE — Telephone Encounter (Signed)
Request received for clinicals for patient requesting incontinence supplies. Sending to provider to possibly addend previous note to state patient incontinence and need for supplies.

## 2023-11-02 NOTE — Telephone Encounter (Signed)
Patient will need updated appointment.  The last person she saw is no longer in the office and note can't be modified.

## 2023-11-23 ENCOUNTER — Encounter: Payer: Medicare Other | Admitting: Nurse Practitioner

## 2023-11-29 ENCOUNTER — Telehealth: Payer: Self-pay | Admitting: Nurse Practitioner

## 2023-11-29 NOTE — Telephone Encounter (Signed)
 Called and LVM giving verbal orders per provider.

## 2023-11-29 NOTE — Telephone Encounter (Signed)
 Okay for verbal order

## 2023-11-29 NOTE — Telephone Encounter (Signed)
 Copied from CRM [id number]. Topic: Quick Communication - Home Health Verbal Orders >> Nov 29, 2023 12:43 PM Everette C wrote: Caller/Agency: [facility] / [facility]  Callback Number:  [phone] Service Requested: Physical Therapy Frequency: 1w8 maintenance therapy  Any new concerns about the patient? No

## 2023-12-07 ENCOUNTER — Telehealth: Payer: Self-pay | Admitting: Nurse Practitioner

## 2023-12-07 NOTE — Telephone Encounter (Signed)
No notes available that discuss the patient's incontinence. Please call to schedule an appointment for patient to discuss with provider.

## 2023-12-07 NOTE — Telephone Encounter (Signed)
Appointment has been made

## 2023-12-07 NOTE — Telephone Encounter (Signed)
Copied from CRM [id number]. Topic: General - Inquiry >> Dec 07, 2023 10:05 AM Clide Dales wrote: Elmarie Shiley with [facility] called to get offices notes anytime between January 2024-2025 stating patients incontinence. Please advise. Fax [phone]

## 2023-12-13 DIAGNOSIS — K5730 Diverticulosis of large intestine without perforation or abscess without bleeding: Secondary | ICD-10-CM | POA: Diagnosis not present

## 2023-12-13 DIAGNOSIS — F17210 Nicotine dependence, cigarettes, uncomplicated: Secondary | ICD-10-CM

## 2023-12-13 DIAGNOSIS — I872 Venous insufficiency (chronic) (peripheral): Secondary | ICD-10-CM

## 2023-12-13 DIAGNOSIS — G809 Cerebral palsy, unspecified: Secondary | ICD-10-CM | POA: Diagnosis not present

## 2023-12-13 DIAGNOSIS — F32A Depression, unspecified: Secondary | ICD-10-CM

## 2023-12-13 DIAGNOSIS — N2889 Other specified disorders of kidney and ureter: Secondary | ICD-10-CM

## 2023-12-13 DIAGNOSIS — E785 Hyperlipidemia, unspecified: Secondary | ICD-10-CM | POA: Diagnosis not present

## 2023-12-13 DIAGNOSIS — I10 Essential (primary) hypertension: Secondary | ICD-10-CM | POA: Diagnosis not present

## 2023-12-14 ENCOUNTER — Telehealth: Payer: Self-pay | Admitting: Nurse Practitioner

## 2023-12-14 NOTE — Telephone Encounter (Signed)
Copied from CRM [id number]. Topic: Medicare AWV >> Dec 14, 2023  1:55 PM Payton Doughty wrote: Reason for CRM: Called 12/14/2023 to sched AWV - NO VOICEMAIL  Verlee Rossetti; Care Guide Ambulatory Clinical Support [facility] Direct Dial: [phone]

## 2023-12-19 DIAGNOSIS — G809 Cerebral palsy, unspecified: Secondary | ICD-10-CM | POA: Diagnosis not present

## 2023-12-19 DIAGNOSIS — E785 Hyperlipidemia, unspecified: Secondary | ICD-10-CM | POA: Diagnosis not present

## 2023-12-19 DIAGNOSIS — K5730 Diverticulosis of large intestine without perforation or abscess without bleeding: Secondary | ICD-10-CM | POA: Diagnosis not present

## 2023-12-19 DIAGNOSIS — I10 Essential (primary) hypertension: Secondary | ICD-10-CM | POA: Diagnosis not present

## 2023-12-19 DIAGNOSIS — N2889 Other specified disorders of kidney and ureter: Secondary | ICD-10-CM

## 2023-12-19 DIAGNOSIS — F17210 Nicotine dependence, cigarettes, uncomplicated: Secondary | ICD-10-CM

## 2023-12-19 DIAGNOSIS — I872 Venous insufficiency (chronic) (peripheral): Secondary | ICD-10-CM

## 2023-12-19 DIAGNOSIS — Z7983 Long term (current) use of bisphosphonates: Secondary | ICD-10-CM

## 2023-12-19 DIAGNOSIS — F32A Depression, unspecified: Secondary | ICD-10-CM

## 2023-12-20 ENCOUNTER — Ambulatory Visit: Payer: Self-pay | Admitting: Nurse Practitioner

## 2023-12-20 ENCOUNTER — Encounter: Payer: Self-pay | Admitting: Nurse Practitioner

## 2023-12-20 VITALS — BP 119/61 | HR 74 | Ht 60.0 in | Wt 160.4 lb

## 2023-12-20 DIAGNOSIS — E7800 Pure hypercholesterolemia, unspecified: Secondary | ICD-10-CM

## 2023-12-20 DIAGNOSIS — F339 Major depressive disorder, recurrent, unspecified: Secondary | ICD-10-CM | POA: Diagnosis not present

## 2023-12-20 DIAGNOSIS — Z23 Encounter for immunization: Secondary | ICD-10-CM | POA: Diagnosis not present

## 2023-12-20 DIAGNOSIS — E118 Type 2 diabetes mellitus with unspecified complications: Secondary | ICD-10-CM

## 2023-12-20 DIAGNOSIS — I10 Essential (primary) hypertension: Secondary | ICD-10-CM

## 2023-12-20 DIAGNOSIS — G809 Cerebral palsy, unspecified: Secondary | ICD-10-CM

## 2023-12-20 DIAGNOSIS — Z7189 Other specified counseling: Secondary | ICD-10-CM

## 2023-12-20 DIAGNOSIS — Z0000 Encounter for general adult medical examination without abnormal findings: Secondary | ICD-10-CM

## 2023-12-20 DIAGNOSIS — F17210 Nicotine dependence, cigarettes, uncomplicated: Secondary | ICD-10-CM

## 2023-12-20 DIAGNOSIS — R7303 Prediabetes: Secondary | ICD-10-CM

## 2023-12-20 MED ORDER — DICLOFENAC SODIUM 1 % EX GEL: 4.0000 g | Freq: Four times a day (QID) | CUTANEOUS | 1 refills | Status: AC

## 2023-12-20 MED ORDER — LOPERAMIDE HCL 2 MG PO CAPS: 2.0000 mg | ORAL_CAPSULE | Freq: Three times a day (TID) | ORAL | 1 refills | Status: DC | PRN

## 2023-12-20 NOTE — Assessment & Plan Note (Signed)
 Chronic, stable. Lipid panel done today. Continue Atorvastatin 40 MG daily, refills given. Return in 6 months.

## 2023-12-20 NOTE — Assessment & Plan Note (Signed)
Chronic.  Not well controlled.  Will increase Fluoxetine to 40mg  daily.  Continue with Hydroxyzine PRN for anxiety.  Follow up in 6 months.  Call sooner if concerns arise.

## 2023-12-20 NOTE — Assessment & Plan Note (Signed)
A voluntary discussion about advance care planning including the explanation and discussion of advance directives was extensively discussed  with the patient for 5 minutes with patient and her caregiver present.  Explanation about the health care proxy and Living will was reviewed and packet with forms with explanation of how to fill them out was given.  During this discussion, the patient was unable to identify a health care proxy.  Patient was offered a separate Advance Care Planning visit for further assistance with forms.

## 2023-12-20 NOTE — Progress Notes (Signed)
BP 119/61 (BP Location: Right Arm, Patient Position: Sitting, Cuff Size: Large)   Pulse 74   Ht 5' (1.524 m)   Wt 160 lb 6.4 oz (72.8 kg)   SpO2 97%   BMI 31.33 kg/m    Subjective:    Patient ID: Michelle Chandler, female    DOB: 02/15/1956, 68 y.o.   MRN: [id number]  HPI: Michelle Chandler is a 68 y.o. female presenting on 12/20/2023 for comprehensive medical examination. Current medical complaints include:none  She currently lives with: Menopausal Symptoms: no  HYPERTENSION / HYPERLIPIDEMIA Taking Hydrochlorothiazide 25 MG daily, Atorvastatin 40 MG daily  Satisfied with current treatment? yes Duration of hypertension: years BP monitoring frequency: weekly  BP range: 120-130/80 BP medication side effects: no Past BP meds: HCTZ Duration of hyperlipidemia: years Cholesterol medication side effects: no Cholesterol supplements: none Past cholesterol medications: atorvastain (lipitor) Medication compliance: excellent compliance Aspirin: no Recent stressors: no Recurrent headaches: no Visual changes: no Palpitations: no Dyspnea: no Chest pain: no Lower extremity edema: Yes Dizzy/lightheaded: no  ANXIETY/STRESS She is taking Fluoxetine 40 MG and Hydroxyzine 25 MG as needed nightly  Duration:worse Anxious mood: yes  Excessive worrying: yes Irritability: yes  Sweating: no Nausea: no Palpitations:no Hyperventilation: no Panic attacks: yes Agoraphobia: yes  Obscessions/compulsions: no Depressed mood: yes    10/21/2023   11:37 AM 03/31/2023   11:10 AM 09/30/2022   11:19 AM 08/03/2022   11:42 AM 06/30/2022   11:43 AM  Depression screen PHQ 2/9  Decreased Interest 2 2 1 1 1   Down, Depressed, Hopeless 3 2 2 1 1   PHQ - 2 Score 5 4 3 2 2   Altered sleeping 2 1 3 1 1   Tired, decreased energy 2 1 1 1 1   Change in appetite 2 2 1 1 1   Feeling bad or failure about yourself  2 2 1 1 1   Trouble concentrating 0 0 1 2 1   Moving slowly or fidgety/restless 0 0 1 0 1  Suicidal thoughts 0 0  0 0 0  PHQ-9 Score 13 10 11 8 8   Difficult doing work/chores Somewhat difficult  Somewhat difficult Somewhat difficult Somewhat difficult      10/21/2023   11:39 AM 03/31/2023   11:10 AM 09/30/2022   11:19 AM 06/30/2022   11:43 AM  GAD 7 : Generalized Anxiety Score  Nervous, Anxious, on Edge 3 1 2 1   Control/stop worrying 3 1 1 1   Worry too much - different things 2 2 0 1  Trouble relaxing 1 1 1 1   Restless 2 0 0 1  Easily annoyed or irritable 3 2 1 1   Afraid - awful might happen 0 1 0 0  Total GAD 7 Score 14 8 5 6   Anxiety Difficulty Somewhat difficult  Not difficult at all Somewhat difficult     Anhedonia: yes Weight changes: no Insomnia: no hard to fall asleep  Fatigue/loss of energy: yes Feelings of worthlessness: no Feelings of guilt: no Impaired concentration/indecisiveness: yes Suicidal ideations: no  Crying spells: no Recent Stressors/Life Changes: no   Relationship problems: no   Family stress: yes     Financial stress: no    Recent death/loss: no   DIABETES Hypoglycemic episodes:no Polydipsia/polyuria: no Visual disturbance: no Chest pain: no Paresthesias: no Glucose Monitoring: no  Accucheck frequency: Not Checking Taking Insulin?: no Blood Pressure Monitoring: daily Retinal Examination: Not up to Date Foot Exam: Up to Date Diabetic Education: Not Completed Pneumovax: Up to Date Influenza: Up to Date Aspirin:  no  GERD Taking Protonix 40 MG daily  GERD control status: stableSatisfied with current treatment? yes Heartburn frequency: weekly  Medication side effects: no  Medication compliance: stable Antacid use frequency:  None Heartburn duration: chronic Alleviatiating factors:   Aggravating factors: spicy foods Dysphagia: no Odynophagia:  no Hematemesis: no Blood in stool: no EGD: no   Functional Status Survey: Is the patient deaf or have difficulty hearing?: No Does the patient have difficulty seeing, even when wearing glasses/contacts?:  No Does the patient have difficulty concentrating, remembering, or making decisions?: No Does the patient have difficulty walking or climbing stairs?: Yes Does the patient have difficulty dressing or bathing?: Yes (dressing) Does the patient have difficulty doing errands alone such as visiting a doctor's office or shopping?: Yes     03/31/2023   11:09 AM 09/30/2022   11:19 AM 06/30/2022   11:42 AM 03/05/2022   11:08 AM 02/05/2022   10:33 AM  Fall Risk   Falls in the past year? 1 0 0 0 0  Number falls in past yr: 1 0 0 0 0  Injury with Fall? 0 0 0 0 0  Risk for fall due to : No Fall Risks No Fall Risks Impaired balance/gait;Impaired mobility Impaired balance/gait;Impaired mobility Impaired mobility  Follow up Falls evaluation completed Falls evaluation completed  Falls evaluation completed Falls evaluation completed    Depression Screen    12/20/2023   11:30 AM 10/21/2023   11:37 AM 03/31/2023   11:10 AM 09/30/2022   11:19 AM 08/03/2022   11:42 AM  Depression screen PHQ 2/9  Decreased Interest 1 2 2 1 1   Down, Depressed, Hopeless 1 3 2 2 1   PHQ - 2 Score 2 5 4 3 2   Altered sleeping 0 2 1 3 1   Tired, decreased energy 1 2 1 1 1   Change in appetite  2 2 1 1   Feeling bad or failure about yourself  1 2 2 1 1   Trouble concentrating 1 0 0 1 2  Moving slowly or fidgety/restless 2 0 0 1 0  Suicidal thoughts 0 0 0 0 0  PHQ-9 Score 7 13 10 11 8   Difficult doing work/chores  Somewhat difficult  Somewhat difficult Somewhat difficult     Advanced Directives Does patient have a HCPOA?    no If yes, name and contact information:  Does patient have a living will or MOST form?  no  Past Medical History:  Past Medical History:  Diagnosis Date   Advance care planning 06/28/2019   Allergic rhinitis    Ataxia    Bursitis of shoulder 11/20/2015   Overview:  Last Assessment & Plan:  Discussed care and treatment shoulder bursitis with meloxicam   CP (cerebral palsy) (HCC)    Depression     Diverticulitis    Diverticulosis    Fatigue 02/05/2020   Hyperlipidemia    Hypertension    IFG (impaired fasting glucose)    Prediabetes 10/07/2020   Sepsis due to Escherichia coli (E. coli) (HCC) 01/31/2022   Venous insufficiency    Venous stasis ulcer (HCC)    Vertigo     Surgical History:  Past Surgical History:  Procedure Laterality Date   ABDOMINAL HYSTERECTOMY     complete   LAPAROSCOPY     legs and hip     due to CP    Medications:  Current Outpatient Medications on File Prior to Visit  Medication Sig   atorvastatin (LIPITOR) 40 MG tablet Take 1 tablet (  40 mg total) by mouth daily.   baclofen 15 MG TABS Take 1 tablet (15 mg total) by mouth every evening.   diclofenac Sodium (VOLTAREN) 1 % GEL    diphenhydrAMINE (BENADRYL) 25 MG tablet Take 25 mg by mouth every 6 (six) hours as needed for itching or allergies.   fluorometholone (FML) 0.1 % ophthalmic suspension SMARTSIG:In Eye(s)   FLUoxetine (PROZAC) 40 MG capsule Take 1 capsule (40 mg total) by mouth daily.   hydrochlorothiazide (HYDRODIURIL) 25 MG tablet Take 1 tablet (25 mg total) by mouth daily.   hydrOXYzine (VISTARIL) 50 MG capsule Take 1 capsule (50 mg total) by mouth every 8 (eight) hours as needed for anxiety.   Incontinence Supply Disposable (ENTRUST PLUS DISP UNDERPADS) MISC by Does not apply route.   mupirocin ointment (BACTROBAN) 2 %    pantoprazole (PROTONIX) 40 MG tablet Take 1 tablet (40 mg total) by mouth daily.   VENTOLIN HFA 108 (90 Base) MCG/ACT inhaler SMARTSIG:2 Puff(s) By Mouth Every 4 Hours PRN   Incontinence Supply Disposable (PROTECTIVE UNDERWEAR LARGE) MISC by Does not apply route. (Patient not taking: Reported on 10/21/2023)   No current facility-administered medications on file prior to visit.    Allergies:  No Known Allergies  Social History:  Social History   Socioeconomic History   Marital status: Single    Spouse name: Not on file   Number of children: Not on file   Years of  education: Not on file   Highest education level: High school graduate  Occupational History   Occupation: disability   Tobacco Use   Smoking status: Every Day    Current packs/day: 0.50    Types: Cigarettes   Smokeless tobacco: Never  Vaping Use   Vaping status: Never Used  Substance and Sexual Activity   Alcohol use: No    Alcohol/week: 0.0 standard drinks of alcohol   Drug use: No   Sexual activity: Not Currently  Other Topics Concern   Not on file  Social History Narrative   Not on file   Social Drivers of Health   Financial Resource Strain: Low Risk  (08/03/2022)   Overall Financial Resource Strain (CARDIA)    Difficulty of Paying Living Expenses: Not hard at all  Food Insecurity: No Food Insecurity (08/03/2022)   Hunger Vital Sign    Worried About Running Out of Food in the Last Year: Never true    Ran Out of Food in the Last Year: Never true  Transportation Needs: No Transportation Needs (08/03/2022)   PRAPARE - Administrator, Civil Service (Medical): No    Lack of Transportation (Non-Medical): No  Physical Activity: Unknown (08/03/2022)   Exercise Vital Sign    Days of Exercise per Week: 0 days    Minutes of Exercise per Session: Not on file  Recent Concern: Physical Activity - Inactive (08/03/2022)   Exercise Vital Sign    Days of Exercise per Week: 0 days    Minutes of Exercise per Session: 0 min  Stress: No Stress Concern Present (08/03/2022)   [employer] of Occupational Health - Occupational Stress Questionnaire    Feeling of Stress : Not at all  Social Connections: Socially Isolated (08/03/2022)   Social Connection and Isolation Panel [NHANES]    Frequency of Communication with Friends and Family: Three times a week    Frequency of Social Gatherings with Friends and Family: Once a week    Attends Religious Services: Never    Active Member of  Clubs or Organizations: No    Attends Banker Meetings: Never    Marital Status:  Divorced  Catering manager Violence: Not At Risk (08/03/2022)   Humiliation, Afraid, Rape, and Kick questionnaire    Fear of Current or Ex-Partner: No    Emotionally Abused: No    Physically Abused: No    Sexually Abused: No   Social History   Tobacco Use  Smoking Status Every Day   Current packs/day: 0.50   Types: Cigarettes  Smokeless Tobacco Never   Social History   Substance and Sexual Activity  Alcohol Use No   Alcohol/week: 0.0 standard drinks of alcohol    Family History:  Family History  Problem Relation Age of Onset   Diabetes Mother    Diabetes Father    Heart disease Maternal Uncle 56   Kidney disease Maternal Uncle     Past medical history, surgical history, medications, allergies, family history and social history reviewed with patient today and changes made to appropriate areas of the chart.   Review of Systems  HENT:         Denies vision changes.  Eyes:  Negative for blurred vision and double vision.  Respiratory:  Negative for shortness of breath.   Cardiovascular:  Negative for chest pain, palpitations and leg swelling.  Neurological:  Negative for dizziness, tingling and headaches.  Endo/Heme/Allergies:  Negative for polydipsia.       Denies Polyuria  Psychiatric/Behavioral:  Positive for depression. Negative for suicidal ideas. The patient is nervous/anxious.     All other ROS negative except what is listed above and in the HPI.      Objective:    BP 119/61 (BP Location: Right Arm, Patient Position: Sitting, Cuff Size: Large)   Pulse 74   Ht 5' (1.524 m)   Wt 160 lb 6.4 oz (72.8 kg)   SpO2 97%   BMI 31.33 kg/m   Wt Readings from Last 3 Encounters:  12/20/23 160 lb 6.4 oz (72.8 kg)  06/02/23 160 lb (72.6 kg)  09/30/22 138 lb 4.8 oz (62.7 kg)    No results found.  Physical Exam Vitals and nursing note reviewed.  Constitutional:      General: She is not in acute distress.    Appearance: Normal appearance. She is normal weight. She  is not ill-appearing, toxic-appearing or diaphoretic.  HENT:     Head: Normocephalic.     Right Ear: External ear normal.     Left Ear: External ear normal.     Nose: Nose normal.     Mouth/Throat:     Mouth: Mucous membranes are moist.     Pharynx: Oropharynx is clear.  Eyes:     General:        Right eye: No discharge.        Left eye: No discharge.     Extraocular Movements: Extraocular movements intact.     Conjunctiva/sclera: Conjunctivae normal.     Pupils: Pupils are equal, round, and reactive to light.  Cardiovascular:     Rate and Rhythm: Normal rate and regular rhythm.     Heart sounds: No murmur heard. Pulmonary:     Effort: Pulmonary effort is normal. No respiratory distress.     Breath sounds: Normal breath sounds. No wheezing or rales.  Musculoskeletal:     Cervical back: Normal range of motion and neck supple.  Skin:    General: Skin is warm and dry.     Capillary Refill: Capillary refill  takes less than 2 seconds.  Neurological:     General: No focal deficit present.     Mental Status: She is alert and oriented to person, place, and time. Mental status is at baseline.  Psychiatric:        Mood and Affect: Mood normal.        Behavior: Behavior normal.        Thought Content: Thought content normal.        Judgment: Judgment normal.        12/20/2023   11:15 AM 08/03/2022   11:36 AM 07/28/2021   11:22 AM 07/26/2020   11:22 AM 01/28/2018   10:44 AM  6CIT Screen  What Year? 0 points 0 points 0 points 4 points 0 points  What month? 0 points 0 points 0 points 0 points 0 points  What time? 0 points 0 points 0 points 0 points 0 points  Count back from 20 0 points 4 points 0 points 0 points 0 points  Months in reverse 0 points 4 points 0 points 0 points 0 points  Repeat phrase 10 points 4 points 4 points 6 points 4 points  Total Score 10 points 12 points 4 points 10 points 4 points    Results for orders placed or performed in visit on 10/21/23  HgB A1c    Collection Time: 10/21/23 12:02 PM  Result Value Ref Range   Hgb A1c MFr Bld 6.6 (H) 4.8 - 5.6 %   Est. average glucose Bld gHb Est-mCnc 143 mg/dL  Comp Met (CMET)   Collection Time: 10/21/23 12:02 PM  Result Value Ref Range   Glucose 105 (H) 70 - 99 mg/dL   BUN 12 8 - 27 mg/dL   Creatinine, Ser 9.60 0.57 - 1.00 mg/dL   eGFR 83 >45 WU/JWJ/1.91   BUN/Creatinine Ratio 15 12 - 28   Sodium 140 134 - 144 mmol/L   Potassium 3.5 3.5 - 5.2 mmol/L   Chloride 96 96 - 106 mmol/L   CO2 28 20 - 29 mmol/L   Calcium 10.5 (H) 8.7 - 10.3 mg/dL   Total Protein 7.0 6.0 - 8.5 g/dL   Albumin 4.2 3.9 - 4.9 g/dL   Globulin, Total 2.8 1.5 - 4.5 g/dL   Bilirubin Total 0.4 0.0 - 1.2 mg/dL   Alkaline Phosphatase 144 (H) 44 - 121 IU/L   AST 13 0 - 40 IU/L   ALT 14 0 - 32 IU/L      Assessment & Plan:   Problem List Items Addressed This Visit       Cardiovascular and Mediastinum   Essential (primary) hypertension   Chronic, stable. CMP done today. Continue Hydrochlorothiazide 25 MG daily. Continue to check blood pressures at home.  Return in 6 months.       Relevant Orders   Comp Met (CMET)     Endocrine   Diabetes mellitus type 2 with complications (HCC)   Chronic.  Controlled without medication.  A1c has been well controlled at 6.2%.  Labs ordered today.  Return to clinic in 6 months for reevaluation.  Call sooner if concerns arise.         Nervous and Auditory   Cerebral palsy (HCC)   Chronic.  Controlled.  Continue with current medication regimen.  Labs ordered today.  Return to clinic in 6 months for reevaluation.  Call sooner if concerns arise.          Other   Hyperlipidemia   Chronic, stable. Lipid  panel done today. Continue Atorvastatin 40 MG daily, refills given. Return in 6 months.       Relevant Orders   Lipid Profile   Depression, recurrent (HCC)   Chronic.  Not well controlled.  Will increase Fluoxetine to 40mg  daily.  Continue with Hydroxyzine PRN for anxiety.  Follow up  in 6 months.  Call sooner if concerns arise.       Advanced care planning/counseling discussion   A voluntary discussion about advance care planning including the explanation and discussion of advance directives was extensively discussed  with the patient for 5 minutes with patient and her caregiver present.  Explanation about the health care proxy and Living will was reviewed and packet with forms with explanation of how to fill them out was given.  During this discussion, the patient was unable to identify a health care proxy.  Patient was offered a separate Advance Care Planning visit for further assistance with forms.         Smoking greater than 40 pack years   Patient declined CT Lung screening.      Relevant Orders   CBC w/Diff   RESOLVED: Prediabetes   Other Visit Diagnoses       Encounter for annual wellness exam in Medicare patient    -  Primary     Need for influenza vaccination       Relevant Orders   Flu Vaccine Trivalent High Dose (Fluad) (Completed)        Preventative Services:  AAA screening: NA Health Risk Assessment and Personalized Prevention Plan: Up to date Bone Mass Measurements: Declined updated Dexa Breast Cancer Screening: Up to date CVD Screening: Up to date Cervical Cancer Screening: NA Colon Cancer Screening: Up to date Depression Screening: Up to date Diabetes Screening: Up to date Glaucoma Screening: Up to date Hepatitis B vaccine: NA Hepatitis C screening: Up to date HIV Screening: Up to date Flu Vaccine: Up to  Lung cancer Screening: Declined Obesity Screening: Up to date Pneumonia Vaccines (2): Up to date STI Screening: NA  Follow up plan: Return in about 6 months (around 06/18/2024) for HTN, HLD, DM2 FU.   LABORATORY TESTING:  - Pap smear: not applicable  IMMUNIZATIONS:   - Tdap: Tetanus vaccination status reviewed: last tetanus booster within 10 years. - Influenza: Administered today - Pneumovax: Up to date - Prevnar: Up to  date - Zostavax vaccine: Up to date  SCREENING: -Mammogram: Up to date  - Colonoscopy: Up to date  - Bone Density: Up to date  -Hearing Test: Not applicable  -Spirometry: Not applicable   PATIENT COUNSELING:   Advised to take 1 mg of folate supplement per day if capable of pregnancy.   Sexuality: Discussed sexually transmitted diseases, partner selection, use of condoms, avoidance of unintended pregnancy  and contraceptive alternatives.   Advised to avoid cigarette smoking.  I discussed with the patient that most people either abstain from alcohol or drink within safe limits (<=14/week and <=4 drinks/occasion for males, <=7/weeks and <= 3 drinks/occasion for females) and that the risk for alcohol disorders and other health effects rises proportionally with the number of drinks per week and how often a drinker exceeds daily limits.  Discussed cessation/primary prevention of drug use and availability of treatment for abuse.   Diet: Encouraged to adjust caloric intake to maintain  or achieve ideal body weight, to reduce intake of dietary saturated fat and total fat, to limit sodium intake by avoiding high sodium foods and not adding  table salt, and to maintain adequate dietary potassium and calcium preferably from fresh fruits, vegetables, and low-fat dairy products.    stressed the importance of regular exercise  Injury prevention: Discussed safety belts, safety helmets, smoke detector, smoking near bedding or upholstery.   Dental health: Discussed importance of regular tooth brushing, flossing, and dental visits.    NEXT PREVENTATIVE PHYSICAL DUE IN 1 YEAR. Return in about 6 months (around 06/18/2024) for HTN, HLD, DM2 FU.

## 2023-12-20 NOTE — Assessment & Plan Note (Signed)
Chronic, stable. CMP done today. Continue Hydrochlorothiazide 25 MG daily. Continue to check blood pressures at home.  Return in 6 months.

## 2023-12-20 NOTE — Assessment & Plan Note (Signed)
 Chronic.  Controlled.  Continue with current medication regimen.  Labs ordered today.  Return to clinic in 6 months for reevaluation.  Call sooner if concerns arise.  ? ?

## 2023-12-20 NOTE — Assessment & Plan Note (Signed)
Patient declined CT Lung screening.

## 2023-12-20 NOTE — Assessment & Plan Note (Signed)
Chronic.  Controlled without medication.  A1c has been well controlled at 6.2%.  Labs ordered today.  Return to clinic in 6 months for reevaluation.  Call sooner if concerns arise.

## 2023-12-21 LAB — COMPREHENSIVE METABOLIC PANEL
ALT: 14 [IU]/L (ref 0–32)
AST: 16 [IU]/L (ref 0–40)
Albumin: 4.2 g/dL (ref 3.9–4.9)
Alkaline Phosphatase: 135 [IU]/L — ABNORMAL HIGH (ref 44–121)
BUN/Creatinine Ratio: 16 (ref 12–28)
BUN: 12 mg/dL (ref 8–27)
Bilirubin Total: 0.4 mg/dL (ref 0.0–1.2)
CO2: 23 mmol/L (ref 20–29)
Calcium: 10.2 mg/dL (ref 8.7–10.3)
Chloride: 95 mmol/L — ABNORMAL LOW (ref 96–106)
Creatinine, Ser: 0.75 mg/dL (ref 0.57–1.00)
Globulin, Total: 2.9 g/dL (ref 1.5–4.5)
Glucose: 102 mg/dL — ABNORMAL HIGH (ref 70–99)
Potassium: 3.7 mmol/L (ref 3.5–5.2)
Sodium: 138 mmol/L (ref 134–144)
Total Protein: 7.1 g/dL (ref 6.0–8.5)
eGFR: 88 mL/min/{1.73_m2} (ref >59)

## 2023-12-21 LAB — CBC WITH DIFFERENTIAL/PLATELET
Basophils Absolute: 0 10*3/uL (ref 0.0–0.2)
Basos: 1 %
EOS (ABSOLUTE): 0.2 10*3/uL (ref 0.0–0.4)
Eos: 2 %
Hematocrit: 45.5 % (ref 34.0–46.6)
Hemoglobin: 15.1 g/dL (ref 11.1–15.9)
Immature Grans (Abs): 0 10*3/uL (ref 0.0–0.1)
Immature Granulocytes: 0 %
Lymphocytes Absolute: 2.1 10*3/uL (ref 0.7–3.1)
Lymphs: 26 %
MCH: 31.1 pg (ref 26.6–33.0)
MCHC: 33.2 g/dL (ref 31.5–35.7)
MCV: 94 fL (ref 79–97)
Monocytes Absolute: 0.5 10*3/uL (ref 0.1–0.9)
Monocytes: 6 %
Neutrophils Absolute: 5.2 10*3/uL (ref 1.4–7.0)
Neutrophils: 65 %
Platelets: 406 10*3/uL (ref 150–450)
RBC: 4.86 x10E6/uL (ref 3.77–5.28)
RDW: 11.9 % (ref 11.7–15.4)
WBC: 7.9 10*3/uL (ref 3.4–10.8)

## 2023-12-21 LAB — LIPID PANEL
Chol/HDL Ratio: 4.6 {ratio} — ABNORMAL HIGH (ref 0.0–4.4)
Cholesterol, Total: 171 mg/dL (ref 100–199)
HDL: 37 mg/dL — ABNORMAL LOW (ref >39)
LDL Chol Calc (NIH): 99 mg/dL (ref 0–99)
Triglycerides: 204 mg/dL — ABNORMAL HIGH (ref 0–149)
VLDL Cholesterol Cal: 35 mg/dL (ref 5–40)

## 2024-01-05 ENCOUNTER — Ambulatory Visit: Payer: Medicare Other

## 2024-01-13 ENCOUNTER — Ambulatory Visit
Admission: RE | Admit: 2024-01-13 | Discharge: 2024-01-13 | Disposition: A | Discharge status: Home / Self Care | Payer: Medicare Other | Source: Ambulatory Visit | Attending: Urology | Admitting: Urology

## 2024-01-13 DIAGNOSIS — N2889 Other specified disorders of kidney and ureter: Secondary | ICD-10-CM | POA: Diagnosis present

## 2024-01-13 MED ORDER — GADOBUTROL 1 MMOL/ML IV SOLN
7.0000 mL | Freq: Once | INTRAVENOUS | Status: AC | PRN
  Administered 2024-01-13: 7 mL via INTRAVENOUS

## 2024-01-24 ENCOUNTER — Telehealth: Payer: Self-pay

## 2024-01-24 NOTE — Telephone Encounter (Signed)
 Called and LVM giving verbal orders per Clydie Braun.

## 2024-01-24 NOTE — Telephone Encounter (Signed)
 Copied from CRM [id number]. Topic: Clinical - Home Health Verbal Orders >> Jan 24, 2024  1:53 PM Almira Coaster wrote: Caller/Agency: Aram Beecham / [facility] Callback Number: [phone] (voicemail secured able to leave detailed message) Service Requested: Physical Therapy Frequency: 1 time a week for 9 weeks  Any new concerns about the patient? No

## 2024-01-24 NOTE — Telephone Encounter (Signed)
 Okay for verbal order

## 2024-01-26 ENCOUNTER — Other Ambulatory Visit: Payer: Self-pay | Admitting: *Deleted

## 2024-01-26 ENCOUNTER — Telehealth: Payer: Self-pay | Admitting: Urology

## 2024-01-26 DIAGNOSIS — N2889 Other specified disorders of kidney and ureter: Secondary | ICD-10-CM

## 2024-01-26 NOTE — Telephone Encounter (Signed)
 Please notify patient her follow-up MRI shows a stable renal mass without increased growth.  The smaller abnormality noted on last MRI was not clearly seen on the follow-up MRI and would recommend continued monitoring.  Please schedule MRI abdomen with without contrast with a follow-up visit in 1 year

## 2024-01-26 NOTE — Telephone Encounter (Signed)
 Notified patient as instructed, patient pleased. Discussed follow-up appointments, patient agrees  MRI order was placed

## 2024-01-31 DIAGNOSIS — F17210 Nicotine dependence, cigarettes, uncomplicated: Secondary | ICD-10-CM

## 2024-01-31 DIAGNOSIS — K5730 Diverticulosis of large intestine without perforation or abscess without bleeding: Secondary | ICD-10-CM | POA: Diagnosis not present

## 2024-01-31 DIAGNOSIS — E785 Hyperlipidemia, unspecified: Secondary | ICD-10-CM | POA: Diagnosis not present

## 2024-01-31 DIAGNOSIS — J309 Allergic rhinitis, unspecified: Secondary | ICD-10-CM

## 2024-01-31 DIAGNOSIS — I872 Venous insufficiency (chronic) (peripheral): Secondary | ICD-10-CM

## 2024-01-31 DIAGNOSIS — G809 Cerebral palsy, unspecified: Secondary | ICD-10-CM | POA: Diagnosis not present

## 2024-01-31 DIAGNOSIS — F32A Depression, unspecified: Secondary | ICD-10-CM

## 2024-01-31 DIAGNOSIS — N2889 Other specified disorders of kidney and ureter: Secondary | ICD-10-CM

## 2024-01-31 DIAGNOSIS — I10 Essential (primary) hypertension: Secondary | ICD-10-CM | POA: Diagnosis not present

## 2024-02-25 ENCOUNTER — Other Ambulatory Visit: Payer: Self-pay | Admitting: Nurse Practitioner

## 2024-02-25 DIAGNOSIS — M810 Age-related osteoporosis without current pathological fracture: Secondary | ICD-10-CM

## 2024-02-25 DIAGNOSIS — G801 Spastic diplegic cerebral palsy: Secondary | ICD-10-CM

## 2024-02-25 DIAGNOSIS — E7800 Pure hypercholesterolemia, unspecified: Secondary | ICD-10-CM

## 2024-02-25 NOTE — Telephone Encounter (Signed)
 Requested medication (s) are due for refill today - unsure  Requested medication (s) are on the active medication list - yes- not at requested dose  Future visit scheduled -yes  Last refill: baclofen 15 mg 10/21/23 #90   Notes to clinic: Please clarify correct dose- 15 mg listed- 10 mg requested   Requested Prescriptions  Pending Prescriptions Disp Refills   baclofen (LIORESAL) 10 MG tablet [Pharmacy Med Name: BACLOFEN 10 MG TABLET] 90 tablet 0    Sig: Take 1 tablet (10 mg total) by mouth every evening.     Analgesics:  Muscle Relaxants - baclofen Passed - 02/25/2024  3:29 PM      Passed - Cr in normal range and within 180 days    Creatinine, Ser  Date Value Ref Range Status  12/20/2023 0.75 0.57 - 1.00 mg/dL Final         Passed - eGFR is 30 or above and within 180 days    GFR calc Af Amer  Date Value Ref Range Status  07/11/2020 108 >59 mL/min/1.73 Final    Comment:    **[facility] currently reports eGFR in compliance with the current**   recommendations of the [employer]. [facility] will   update reporting as new guidelines are published from the NKF-ASN   Task force.    GFR, Estimated  Date Value Ref Range Status  02/02/2022 >60 >60 mL/min Final    Comment:    (NOTE) Calculated using the CKD-EPI Creatinine Equation (2021)    eGFR  Date Value Ref Range Status  12/20/2023 88 >59 mL/min/1.73 Final         Passed - Valid encounter within last 6 months    Recent Outpatient Visits           2 months ago Encounter for annual wellness exam in Medicare patient   [facility] Larae Grooms, NP       Future Appointments             In 3 months Larae Grooms, NP [facility]   In 11 months Stoioff, Verna Czech, MD [facility] [place]               Requested Prescriptions  Pending Prescriptions Disp Refills   baclofen (LIORESAL) 10 MG tablet [Pharmacy Med Name: BACLOFEN 10 MG  TABLET] 90 tablet 0    Sig: Take 1 tablet (10 mg total) by mouth every evening.     Analgesics:  Muscle Relaxants - baclofen Passed - 02/25/2024  3:29 PM      Passed - Cr in normal range and within 180 days    Creatinine, Ser  Date Value Ref Range Status  12/20/2023 0.75 0.57 - 1.00 mg/dL Final         Passed - eGFR is 30 or above and within 180 days    GFR calc Af Amer  Date Value Ref Range Status  07/11/2020 108 >59 mL/min/1.73 Final    Comment:    **[facility] currently reports eGFR in compliance with the current**   recommendations of the [employer]. [facility] will   update reporting as new guidelines are published from the NKF-ASN   Task force.    GFR, Estimated  Date Value Ref Range Status  02/02/2022 >60 >60 mL/min Final    Comment:    (NOTE) Calculated using the CKD-EPI Creatinine Equation (2021)    eGFR  Date Value Ref Range Status  12/20/2023 88 >59 mL/min/1.73  Final         Passed - Valid encounter within last 6 months    Recent Outpatient Visits           2 months ago Encounter for annual wellness exam in Medicare patient   [facility] Larae Grooms, NP       Future Appointments             In 3 months Larae Grooms, NP [facility]   In 11 months Stoioff, Verna Czech, MD [facility]

## 2024-02-28 ENCOUNTER — Other Ambulatory Visit: Payer: Self-pay | Admitting: Nurse Practitioner

## 2024-02-28 ENCOUNTER — Other Ambulatory Visit: Payer: Self-pay

## 2024-02-28 DIAGNOSIS — G809 Cerebral palsy, unspecified: Secondary | ICD-10-CM

## 2024-02-28 MED ORDER — BACLOFEN 15 MG PO TABS: 1.0000 | ORAL_TABLET | Freq: Every day | ORAL | 0 refills | Status: DC

## 2024-02-28 NOTE — Telephone Encounter (Signed)
 Copied from CRM [id number]. Topic: Clinical - Medication Refill >> Feb 28, 2024  9:55 AM Felecia Hopper H wrote: Most Recent Primary Care Visit:  Provider: Aileen Alexanders  Department: [facility]  Visit Type: OFFICE VISIT  Date: 12/20/2023  Medication: baclofen 15 MG TABS [[id number]]  Has the patient contacted their pharmacy? No (Agent: If no, request that the patient contact the pharmacy for the refill. If patient does not wish to contact the pharmacy document the reason why and proceed with request.) (Agent: If yes, when and what did the pharmacy advise?) Patient last appointment was in 2/3//Please call patient if she needs to be seen first//  Is this the correct pharmacy for this prescription? Yes If no, delete pharmacy and type the correct one.  This is the patient's preferred pharmacy:  [facility] - [place], [place] - [address] [place] [address] Phone: [phone] Fax: [phone]     Has the prescription been filled recently? No  Is the patient out of the medication? Yes  Has the patient been seen for an appointment in the last year OR does the patient have an upcoming appointment? Yes  Can we respond through MyChart? NO  Agent: Please be advised that Rx refills may take up to 3 business days. We ask that you follow-up with your pharmacy.

## 2024-02-28 NOTE — Telephone Encounter (Signed)
 Copied from CRM [id number]. Topic: Clinical - Medication Refill >> Feb 28, 2024  9:55 AM Felecia Hopper H wrote: Most Recent Primary Care Visit:  Provider: Aileen Alexanders  Department: [facility]  Visit Type: OFFICE VISIT  Date: 12/20/2023  Medication: baclofen 15 MG TABS [[id number]]  Has the patient contacted their pharmacy? No (Agent: If no, request that the patient contact the pharmacy for the refill. If patient does not wish to contact the pharmacy document the reason why and proceed with request.) (Agent: If yes, when and what did the pharmacy advise?) Patient last appointment was in 2/3//Please call patient if she needs to be seen first//  Is this the correct pharmacy for this prescription? Yes If no, delete pharmacy and type the correct one.  This is the patient's preferred pharmacy:  [facility] - [place], [place] - [address] [place] [address] Phone: [phone] Fax: [phone]     Has the prescription been filled recently? No  Is the patient out of the medication? Yes  Has the patient been seen for an appointment in the last year OR does the patient have an upcoming appointment? Yes  Can we respond through MyChart? NO  Agent: Please be advised that Rx refills may take up to 3 business days. We ask that you follow-up with your pharmacy. >> Feb 28, 2024  1:46 PM Emylou G wrote: Pls contact patient back.. wants to review medication denial?  Would like refill.

## 2024-03-29 ENCOUNTER — Telehealth: Payer: Self-pay | Admitting: Nurse Practitioner

## 2024-03-29 NOTE — Telephone Encounter (Signed)
 Copied from CRM [id number]. Topic: Clinical - Home Health Verbal Orders >> Mar 24, 2024  4:47 PM Turkey B wrote: Caller/Agency: cindy from [facility] Callback Number: [phone] Service Requested: Physical Therapy continuation Frequency: 1x9 Any new concerns about the patient? no

## 2024-03-30 NOTE — Telephone Encounter (Signed)
 Called and LVM giving verbal orders per Clydie Braun.

## 2024-03-30 NOTE — Telephone Encounter (Signed)
 Okay for verbal orders.

## 2024-03-31 DIAGNOSIS — N289 Disorder of kidney and ureter, unspecified: Secondary | ICD-10-CM

## 2024-03-31 DIAGNOSIS — I872 Venous insufficiency (chronic) (peripheral): Secondary | ICD-10-CM

## 2024-03-31 DIAGNOSIS — F17210 Nicotine dependence, cigarettes, uncomplicated: Secondary | ICD-10-CM

## 2024-03-31 DIAGNOSIS — K5730 Diverticulosis of large intestine without perforation or abscess without bleeding: Secondary | ICD-10-CM | POA: Diagnosis not present

## 2024-03-31 DIAGNOSIS — J309 Allergic rhinitis, unspecified: Secondary | ICD-10-CM

## 2024-03-31 DIAGNOSIS — F32A Depression, unspecified: Secondary | ICD-10-CM

## 2024-03-31 DIAGNOSIS — E785 Hyperlipidemia, unspecified: Secondary | ICD-10-CM | POA: Diagnosis not present

## 2024-03-31 DIAGNOSIS — G809 Cerebral palsy, unspecified: Secondary | ICD-10-CM | POA: Diagnosis not present

## 2024-03-31 DIAGNOSIS — I10 Essential (primary) hypertension: Secondary | ICD-10-CM | POA: Diagnosis not present

## 2024-04-03 ENCOUNTER — Other Ambulatory Visit: Payer: Self-pay | Admitting: Nurse Practitioner

## 2024-04-03 NOTE — Telephone Encounter (Signed)
 Copied from CRM [id number]. Topic: Clinical - Medication Refill >> Apr 03, 2024 10:22 AM Emylou G wrote: Medication: hydrochlorothiazide  (HYDRODIURIL ) 25 MG tablet hydrOXYzine  (VISTARIL ) 50 MG capsule atorvastatin  (LIPITOR) 40 MG tablet pantoprazole  (PROTONIX ) 40 MG tablet FLUoxetine  (PROZAC ) 40 MG capsule loperamide  (IMODIUM ) 2 MG capsule FLUoxetine  (PROZAC ) 40 MG capsule   Has the patient contacted their pharmacy? Yes (Agent: If no, request that the patient contact the pharmacy for the refill. If patient does not wish to contact the pharmacy document the reason why and proceed with request.) (Agent: If yes, when and what did the pharmacy advise?) said to call us   This is the patient's preferred pharmacy:  [facility] - [place], [place] - [address] [place] [address] Phone: [phone] Fax: [phone]  Is this the correct pharmacy for this prescription? Yes If no, delete pharmacy and type the correct one.   Has the prescription been filled recently? No  Is the patient out of the medication? Yes  Has the patient been seen for an appointment in the last year OR does the patient have an upcoming appointment? Yes  Can we respond through MyChart? No  Agent: Please be advised that Rx refills may take up to 3 business days. We ask that you follow-up with your pharmacy.

## 2024-04-05 MED ORDER — ATORVASTATIN CALCIUM 40 MG PO TABS: 40.0000 mg | ORAL_TABLET | Freq: Every day | ORAL | 1 refills | Status: DC

## 2024-04-05 MED ORDER — FLUOXETINE HCL 40 MG PO CAPS: 40.0000 mg | ORAL_CAPSULE | Freq: Every day | ORAL | 1 refills | Status: DC

## 2024-04-05 MED ORDER — HYDROXYZINE PAMOATE 50 MG PO CAPS: 50.0000 mg | ORAL_CAPSULE | Freq: Three times a day (TID) | ORAL | 0 refills | Status: DC | PRN

## 2024-04-05 MED ORDER — PANTOPRAZOLE SODIUM 40 MG PO TBEC: 40.0000 mg | DELAYED_RELEASE_TABLET | Freq: Every day | ORAL | 1 refills | Status: DC

## 2024-04-05 MED ORDER — LOPERAMIDE HCL 2 MG PO CAPS: 2.0000 mg | ORAL_CAPSULE | Freq: Three times a day (TID) | ORAL | 1 refills | Status: AC | PRN

## 2024-04-05 NOTE — Telephone Encounter (Signed)
 Requested Prescriptions  Pending Prescriptions Disp Refills   hydrOXYzine  (VISTARIL ) 50 MG capsule 90 capsule 0    Sig: Take 1 capsule (50 mg total) by mouth every 8 (eight) hours as needed for anxiety.     Ear, Nose, and Throat:  Antihistamines 2 Passed - 04/05/2024 10:38 AM      Passed - Cr in normal range and within 360 days    Creatinine, Ser  Date Value Ref Range Status  12/20/2023 0.75 0.57 - 1.00 mg/dL Final         Passed - Valid encounter within last 12 months    Recent Outpatient Visits           3 months ago Encounter for annual wellness exam in Medicare patient   [facility] Aileen Alexanders, NP       Future Appointments             In 2 months Aileen Alexanders, NP [place] [facility], [facility]   In 10 months Stoioff, Kizzie Perks, MD [facility] [place]             atorvastatin  (LIPITOR) 40 MG tablet 90 tablet 1    Sig: Take 1 tablet (40 mg total) by mouth daily.     Cardiovascular:  Antilipid - Statins Failed - 04/05/2024 10:38 AM      Failed - Lipid Panel in normal range within the last 12 months    Cholesterol, Total  Date Value Ref Range Status  12/20/2023 171 100 - 199 mg/dL Final   Cholesterol Piccolo, Waived  Date Value Ref Range Status  08/16/2018 231 (H) <200 mg/dL Final    Comment:                            Desirable                <200                         Borderline High      200- 239                         High                     >239    LDL Chol Calc (NIH)  Date Value Ref Range Status  12/20/2023 99 0 - 99 mg/dL Final   HDL  Date Value Ref Range Status  12/20/2023 37 (L) >39 mg/dL Final   Triglycerides  Date Value Ref Range Status  12/20/2023 204 (H) 0 - 149 mg/dL Final   Triglycerides Piccolo,Waived  Date Value Ref Range Status  08/16/2018 266 (H) <150 mg/dL Final    Comment:                            Normal                   <150                         Borderline High      150 - 199                         High  200 - 499                         Very High                >499          Passed - Patient is not pregnant      Passed - Valid encounter within last 12 months    Recent Outpatient Visits           3 months ago Encounter for annual wellness exam in Medicare patient   [place] [facility] Aileen Alexanders, NP       Future Appointments             In 2 months Aileen Alexanders, NP [place] [facility], [facility]   In 10 months Stoioff, Kizzie Perks, MD [facility] [place]             pantoprazole  (PROTONIX ) 40 MG tablet 90 tablet 1    Sig: Take 1 tablet (40 mg total) by mouth daily.     Gastroenterology: Proton Pump Inhibitors Passed - 04/05/2024 10:38 AM      Passed - Valid encounter within last 12 months    Recent Outpatient Visits           3 months ago Encounter for annual wellness exam in Medicare patient   [place] [facility] Aileen Alexanders, NP       Future Appointments             In 2 months Aileen Alexanders, NP [facility], [facility]   In 10 months Stoioff, Kizzie Perks, MD [facility] [place]             FLUoxetine  (PROZAC ) 40 MG capsule 90 capsule 1    Sig: Take 1 capsule (40 mg total) by mouth daily.     Psychiatry:  Antidepressants - SSRI Passed - 04/05/2024 10:38 AM      Passed - Completed PHQ-2 or PHQ-9 in the last 360 days      Passed - Valid encounter within last 6 months    Recent Outpatient Visits           3 months ago Encounter for annual wellness exam in Medicare patient   [place] [facility] Aileen Alexanders, NP       Future Appointments             In 2 months Aileen Alexanders, NP [place] [facility], [facility]   In 10 months Stoioff, Kizzie Perks, MD [facility]              loperamide  (IMODIUM ) 2 MG capsule 30 capsule 1    Sig: Take 1  capsule (2 mg total) by mouth every 8 (eight) hours as needed for diarrhea or loose stools.     Over the Counter:  OTC Passed - 04/05/2024 10:38 AM      Passed - Valid encounter within last 12 months    Recent Outpatient Visits           3 months ago Encounter for annual wellness exam in Medicare patient   [place] [facility] Aileen Alexanders, NP       Future Appointments             In 2 months Aileen Alexanders, NP [place] [facility], [facility]   In 10 months Dwight,  Kizzie Perks, MD [facility]

## 2024-04-18 ENCOUNTER — Telehealth: Payer: Self-pay

## 2024-04-18 ENCOUNTER — Other Ambulatory Visit: Payer: Self-pay | Admitting: Nurse Practitioner

## 2024-04-18 NOTE — Telephone Encounter (Signed)
 [facility] - [place], [place] - [address] (Pharmacy)  [phone]  Per [facility] patient's feet is swelling. Requesting refill as soon as possible.  hydrochlorothiazide  (HYDRODIURIL ) 25 MG tablet

## 2024-04-18 NOTE — Telephone Encounter (Unsigned)
 Copied from CRM [id number]. Topic: Clinical - Prescription Issue >> Apr 18, 2024 12:54 PM Zipporah Him wrote: Reason for CRM: Patient still has not gotten a refill of hydrochlorothiazide , calling to see what the issue is. Please advise patient at number listed.

## 2024-04-19 NOTE — Telephone Encounter (Signed)
 Prescription filled this morning for the patient. Called and let patient know that the medication has been sent to the pharmacy.

## 2024-05-10 ENCOUNTER — Telehealth: Payer: Self-pay | Admitting: Nurse Practitioner

## 2024-05-10 NOTE — Telephone Encounter (Signed)
 Copied from CRM [id number]. Topic: Referral - Question >> May 10, 2024 11:35 AM Winona SAUNDERS wrote: Prentice  from [facility] calling to verify the requisition for nuero consult was received, [phone]

## 2024-05-25 ENCOUNTER — Telehealth: Payer: Self-pay | Admitting: Nurse Practitioner

## 2024-05-25 NOTE — Telephone Encounter (Signed)
 Copied from CRM [id number]. Topic: General - Other >> May 24, 2024  2:57 PM Willma R wrote: Reason for CRM: Prentice  from [facility] calling to verify the requisition for nuero consult was received. Would like a call back to make sure as he has sent it over a few times.  Prentice can be reached at [phone]

## 2024-05-25 NOTE — Telephone Encounter (Signed)
 Forwarding to clinical for follow up on this

## 2024-05-30 DIAGNOSIS — K5730 Diverticulosis of large intestine without perforation or abscess without bleeding: Secondary | ICD-10-CM | POA: Diagnosis not present

## 2024-05-30 DIAGNOSIS — E785 Hyperlipidemia, unspecified: Secondary | ICD-10-CM

## 2024-05-30 DIAGNOSIS — F32A Depression, unspecified: Secondary | ICD-10-CM

## 2024-05-30 DIAGNOSIS — G809 Cerebral palsy, unspecified: Secondary | ICD-10-CM | POA: Diagnosis not present

## 2024-05-30 DIAGNOSIS — I10 Essential (primary) hypertension: Secondary | ICD-10-CM | POA: Diagnosis not present

## 2024-05-30 DIAGNOSIS — I872 Venous insufficiency (chronic) (peripheral): Secondary | ICD-10-CM | POA: Diagnosis not present

## 2024-05-30 DIAGNOSIS — N2889 Other specified disorders of kidney and ureter: Secondary | ICD-10-CM

## 2024-05-30 DIAGNOSIS — J309 Allergic rhinitis, unspecified: Secondary | ICD-10-CM

## 2024-05-30 DIAGNOSIS — F17210 Nicotine dependence, cigarettes, uncomplicated: Secondary | ICD-10-CM

## 2024-06-02 NOTE — Telephone Encounter (Signed)
 Can I ask for assistance here. Michelle Chandler says she has not received anything. I can't tell if this is legit or not. Can someone reach out to them and ask for it to be resent or try to determine if this is legit or not.   Thank you!

## 2024-06-02 NOTE — Telephone Encounter (Signed)
 Copied from CRM [id number]. Topic: General - Other >> Jun 01, 2024  3:30 PM Fonda T wrote: Reason for CRM: Received call from Prentice, with [facility], regarding an order for a neuro screening consult, which he states has been faxed several times, but with no response from office. Reports most recent faxes were sent on 05/18/24, 05/24/24, and 05/29/24.  Prentice requesting a return call as soon as possible with status update.  Can be reached at [phone].

## 2024-06-02 NOTE — Telephone Encounter (Signed)
 Per clinical determined this is not going to be completed by provider. Info coming back as Clinical cytogeneticist not [employer].

## 2024-06-14 ENCOUNTER — Other Ambulatory Visit: Payer: Self-pay | Admitting: Nurse Practitioner

## 2024-06-15 NOTE — Telephone Encounter (Signed)
 Requested Prescriptions  Pending Prescriptions Disp Refills   hydrochlorothiazide  (HYDRODIURIL ) 25 MG tablet [Pharmacy Med Name: HYDROCHLOROTHIAZIDE  25 MG TAB] 90 tablet 0    Sig: Take 1 tablet (25 mg total) by mouth daily.     Cardiovascular: Diuretics - Thiazide Passed - 06/15/2024 11:44 AM      Passed - Cr in normal range and within 180 days    Creatinine, Ser  Date Value Ref Range Status  12/20/2023 0.75 0.57 - 1.00 mg/dL Final         Passed - K in normal range and within 180 days    Potassium  Date Value Ref Range Status  12/20/2023 3.7 3.5 - 5.2 mmol/L Final         Passed - Na in normal range and within 180 days    Sodium  Date Value Ref Range Status  12/20/2023 138 134 - 144 mmol/L Final         Passed - Last BP in normal range    BP Readings from Last 1 Encounters:  12/20/23 119/61         Passed - Valid encounter within last 6 months    Recent Outpatient Visits           5 months ago Encounter for annual wellness exam in Medicare patient   [place] [facility] Melvin Pao, NP       Future Appointments             In 1 week Melvin Pao, NP [place] [facility], [facility]   In 8 months Stoioff, Glendia BROCKS, MD [facility] [place]

## 2024-06-20 ENCOUNTER — Telehealth: Payer: Self-pay | Admitting: Nurse Practitioner

## 2024-06-20 NOTE — Telephone Encounter (Signed)
 Copied from CRM [id number]. Topic: General - Call Back - No Documentation >> Jun 13, 2024 12:23 PM Avram G wrote: Reason for CRM: Dorise is calling to confirm if the form was received requisition neurology screening if so please fax it back.   [phone](ipmzru line)  Fax:[phone]

## 2024-06-20 NOTE — Telephone Encounter (Signed)
 If return call received please tell this company, patient must come into the office for a visit with us  for us  to order any labs. Paperwork will not be completed nor returned.

## 2024-06-22 ENCOUNTER — Encounter: Payer: Self-pay | Admitting: Nurse Practitioner

## 2024-06-22 ENCOUNTER — Ambulatory Visit (INDEPENDENT_AMBULATORY_CARE_PROVIDER_SITE_OTHER): Payer: Medicare Other | Admitting: Nurse Practitioner

## 2024-06-22 VITALS — BP 130/80 | HR 69 | Temp 98.2°F | Ht 61.5 in

## 2024-06-22 DIAGNOSIS — I10 Essential (primary) hypertension: Secondary | ICD-10-CM | POA: Diagnosis not present

## 2024-06-22 DIAGNOSIS — F419 Anxiety disorder, unspecified: Secondary | ICD-10-CM

## 2024-06-22 DIAGNOSIS — E7800 Pure hypercholesterolemia, unspecified: Secondary | ICD-10-CM

## 2024-06-22 DIAGNOSIS — E118 Type 2 diabetes mellitus with unspecified complications: Secondary | ICD-10-CM

## 2024-06-22 DIAGNOSIS — E876 Hypokalemia: Secondary | ICD-10-CM

## 2024-06-22 DIAGNOSIS — F339 Major depressive disorder, recurrent, unspecified: Secondary | ICD-10-CM | POA: Diagnosis not present

## 2024-06-22 MED ORDER — HYDROXYZINE PAMOATE 50 MG PO CAPS: 50.0000 mg | ORAL_CAPSULE | Freq: Three times a day (TID) | ORAL | 1 refills | Status: AC | PRN

## 2024-06-22 MED ORDER — PANTOPRAZOLE SODIUM 40 MG PO TBEC: 40.0000 mg | DELAYED_RELEASE_TABLET | Freq: Every day | ORAL | 1 refills | Status: AC

## 2024-06-22 MED ORDER — FLUOXETINE HCL 40 MG PO CAPS: 40.0000 mg | ORAL_CAPSULE | Freq: Every day | ORAL | 1 refills | Status: AC

## 2024-06-22 MED ORDER — HYDROCHLOROTHIAZIDE 25 MG PO TABS: 25.0000 mg | ORAL_TABLET | Freq: Every day | ORAL | 1 refills | Status: AC

## 2024-06-22 MED ORDER — ATORVASTATIN CALCIUM 40 MG PO TABS: 40.0000 mg | ORAL_TABLET | Freq: Every day | ORAL | 1 refills | Status: AC

## 2024-06-22 NOTE — Assessment & Plan Note (Signed)
 Labs ordered at visit today.  Will make recommendations based on lab results.

## 2024-06-22 NOTE — Progress Notes (Signed)
 BP 130/80   Pulse 69   Temp 98.2 F (36.8 C) (Oral)   Ht 5' 1.5 (1.562 m)   SpO2 97%   BMI 29.82 kg/m    Subjective:    Patient ID: Michelle Chandler, female    DOB: [birth year], 68 y.o.   MRN: [id number]  HPI: Michelle Chandler is a 68 y.o. female  Chief Complaint  Patient presents with   Diabetes   Hypertension   Hyperlipidemia   HYPERTENSION / HYPERLIPIDEMIA Taking Hydrochlorothiazide  25 MG daily, Atorvastatin  40 MG daily  Satisfied with current treatment? yes Duration of hypertension: years BP monitoring frequency: weekly - with physical therapy BP range: unsure what it was  BP medication side effects: no Past BP meds: HCTZ Duration of hyperlipidemia: years Cholesterol medication side effects: no Cholesterol supplements: none Past cholesterol medications: atorvastain (lipitor) Medication compliance: excellent compliance Aspirin: no Recent stressors: no Recurrent headaches: no Visual changes: no Palpitations: no Dyspnea: no Chest pain: no Lower extremity edema: Yes Dizzy/lightheaded: no  ANXIETY/STRESS She is taking Fluoxetine  40 MG and Hydroxyzine  25 MG as needed nightly .  She has been more stressed due to her mom being in and out of the hospital.  Duration:worse Anxious mood: yes  Excessive worrying: yes Irritability: yes  Sweating: no Nausea: no Palpitations:no Hyperventilation: no Panic attacks: yes Agoraphobia: yes  Obscessions/compulsions: no Depressed mood: yes    06/22/2024   11:18 AM 12/20/2023   11:30 AM 10/21/2023   11:37 AM 03/31/2023   11:10 AM 09/30/2022   11:19 AM  Depression screen PHQ 2/9  Decreased Interest 2 1 2 2 1   Down, Depressed, Hopeless  1 3 2 2   PHQ - 2 Score 2 2 5 4 3   Altered sleeping 3 0 2 1 3   Tired, decreased energy 1 1 2 1 1   Change in appetite   2 2 1   Feeling bad or failure about yourself  3 1 2 2 1   Trouble concentrating 0 1 0 0 1  Moving slowly or fidgety/restless 0 2 0 0 1  Suicidal thoughts 0 0 0 0 0  PHQ-9 Score 9  7 13 10 11   Difficult doing work/chores Somewhat difficult  Somewhat difficult  Somewhat difficult      06/22/2024   11:19 AM 12/20/2023   11:30 AM 10/21/2023   11:39 AM 03/31/2023   11:10 AM  GAD 7 : Generalized Anxiety Score  Nervous, Anxious, on Edge 3 1 3 1   Control/stop worrying 3 1 3 1   Worry too much - different things 3 2 2 2   Trouble relaxing 3 1 1 1   Restless 0 0 2 0  Easily annoyed or irritable 3 1 3 2   Afraid - awful might happen 3 0 0 1  Total GAD 7 Score 18 6 14 8   Anxiety Difficulty Somewhat difficult  Somewhat difficult      Anhedonia: yes Weight changes: no Insomnia: no hard to fall asleep  Fatigue/loss of energy: yes Feelings of worthlessness: no Feelings of guilt: no Impaired concentration/indecisiveness: yes Suicidal ideations: no  Crying spells: no Recent Stressors/Life Changes: no   Relationship problems: no   Family stress: yes     Financial stress: no    Recent death/loss: no   DIABETES Doing well.  No concerns.  Not currently taking any medication.  Hypoglycemic episodes:no Polydipsia/polyuria: no Visual disturbance: no Chest pain: no Paresthesias: no Glucose Monitoring: no  Accucheck frequency: Not Checking Taking Insulin?: no Blood Pressure Monitoring: daily Retinal Examination: Not up to Date Foot Exam: Up to Date Diabetic Education: Not Completed Pneumovax: Up to Date Influenza: Up to Date Aspirin: no  GERD Taking Protonix  40 MG daily  GERD control status: stableSatisfied with current treatment? yes Heartburn frequency: weekly  Medication side effects: no  Medication compliance: stable Antacid use frequency:  None Heartburn duration: chronic Alleviatiating factors:   Aggravating factors: spicy foods Dysphagia: no Odynophagia:  no Hematemesis: no Blood in stool: no EGD: no    Relevant past medical, surgical, family and social history reviewed and updated as indicated. Interim medical history since our last visit reviewed. Allergies and medications reviewed and updated.  Review of Systems  Eyes:  Negative for visual disturbance.  Respiratory:  Negative for cough, chest tightness and shortness of breath.   Cardiovascular:  Positive for leg swelling. Negative for chest pain and palpitations.  Gastrointestinal:  Negative for constipation, diarrhea, nausea and vomiting.  Endocrine: Negative for polydipsia and polyuria.  Neurological:  Negative for dizziness, numbness and headaches.  Psychiatric/Behavioral:  Positive for agitation. Negative for confusion, decreased concentration, dysphoric mood, sleep disturbance and suicidal ideas. The patient is nervous/anxious.     Per HPI unless specifically indicated above     Objective:    BP 130/80   Pulse 69   Temp 98.2 F (36.8 C) (Oral)   Ht 5' 1.5 (1.562 m)   SpO2 97%   BMI 29.82 kg/m   Wt Readings from Last 3 Encounters:  12/20/23 160 lb 6.4 oz (72.8 kg)  06/02/23 160 lb (72.6 kg)  09/30/22 138 lb 4.8 oz (62.7 kg)    Physical Exam Vitals and nursing note reviewed.  Constitutional:      General: She is not in acute distress.    Appearance: Normal appearance. She is normal weight. She is not ill-appearing, toxic-appearing or diaphoretic.  HENT:     Head: Normocephalic.     Right Ear: External ear normal.     Left Ear: External ear normal.     Nose: No congestion or rhinorrhea.     Mouth/Throat:     Mouth: Mucous membranes are moist.     Pharynx: Oropharynx is clear.  Eyes:     General:        Right eye: No discharge.        Left eye: No discharge.     Extraocular Movements: Extraocular movements intact.     Conjunctiva/sclera: Conjunctivae normal.     Pupils: Pupils are equal, round, and reactive to light.  Cardiovascular:     Rate and Rhythm: Normal rate and regular rhythm.     Pulses:           Radial pulses are 2+ on the right side and 2+ on the left side.       Posterior tibial pulses are 2+ on the right side and 2+ on the left side.     Heart sounds: No murmur heard. Pulmonary:     Effort: Pulmonary effort is normal. No respiratory distress.     Breath sounds: Normal breath sounds. No wheezing, rhonchi or rales.  Abdominal:     General: Bowel sounds are normal.     Tenderness: There is no abdominal tenderness.  Musculoskeletal:     Cervical back: Normal range of motion and neck supple. No edema, erythema, signs of trauma,  rigidity, torticollis, tenderness or crepitus. Pain with movement and muscular tenderness present. No spinous process tenderness. Normal range of motion.     Right lower leg: Edema present.     Left lower leg: Edema present.     Comments: Uses wheel chair  Skin:    General: Skin is warm and dry.     Capillary Refill: Capillary refill takes less than 2 seconds.  Neurological:     General: No focal deficit present.     Mental Status: She is alert and oriented to person, place, and time. Mental status is at baseline.  Psychiatric:        Mood and Affect: Mood normal.        Behavior: Behavior normal.        Thought Content: Thought content normal.        Judgment: Judgment normal.     Results for orders placed or performed in visit on 12/20/23  Comp Met (CMET)   Collection Time: 12/20/23 11:46 AM  Result Value Ref Range   Glucose 102 (H) 70 - 99 mg/dL   BUN 12 8 - 27 mg/dL   Creatinine, Ser 9.24 0.57 - 1.00 mg/dL   eGFR 88 >40 fO/fpw/8.26   BUN/Creatinine Ratio 16 12 - 28   Sodium 138 134 - 144 mmol/L   Potassium 3.7 3.5 - 5.2 mmol/L   Chloride 95 (L) 96 - 106 mmol/L   CO2 23 20 - 29 mmol/L   Calcium  10.2 8.7 - 10.3 mg/dL   Total Protein 7.1 6.0 - 8.5 g/dL   Albumin 4.2 3.9 - 4.9 g/dL   Globulin, Total 2.9 1.5 - 4.5 g/dL   Bilirubin Total 0.4 0.0 - 1.2 mg/dL   Alkaline Phosphatase 135 (H) 44 - 121 IU/L   AST 16 0 - 40 IU/L   ALT 14 0 - 32  IU/L  Lipid Profile   Collection Time: 12/20/23 11:46 AM  Result Value Ref Range   Cholesterol, Total 171 100 - 199 mg/dL   Triglycerides 795 (H) 0 - 149 mg/dL   HDL 37 (L) >60 mg/dL   VLDL Cholesterol Cal 35 5 - 40 mg/dL   LDL Chol Calc (NIH) 99 0 - 99 mg/dL   Chol/HDL Ratio 4.6 (H) 0.0 - 4.4 ratio  CBC w/Diff   Collection Time: 12/20/23 11:46 AM  Result Value Ref Range   WBC 7.9 3.4 - 10.8 x10E3/uL   RBC 4.86 3.77 - 5.28 x10E6/uL   Hemoglobin 15.1 11.1 - 15.9 g/dL   Hematocrit 54.4 65.9 - 46.6 %   MCV 94 79 - 97 fL   MCH 31.1 26.6 - 33.0 pg   MCHC 33.2 31.5 - 35.7 g/dL   RDW 88.0 88.2 - 84.5 %   Platelets 406 150 - 450 x10E3/uL   Neutrophils 65 Not Estab. %   Lymphs 26 Not Estab. %   Monocytes 6 Not Estab. %   Eos 2 Not Estab. %   Basos 1 Not Estab. %   Neutrophils Absolute 5.2 1.4 - 7.0 x10E3/uL   Lymphocytes Absolute 2.1 0.7 - 3.1 x10E3/uL   Monocytes Absolute 0.5 0.1 - 0.9 x10E3/uL   EOS (ABSOLUTE) 0.2 0.0 - 0.4 x10E3/uL   Basophils Absolute 0.0 0.0 - 0.2 x10E3/uL   Immature Granulocytes 0 Not Estab. %   Immature Grans (Abs) 0.0 0.0 - 0.1 x10E3/uL      Assessment & Plan:   Problem List Items Addressed This Visit       Cardiovascular  and Mediastinum   Essential (primary) hypertension - Primary   Chronic, stable. CMP done today. Continue Hydrochlorothiazide  25 MG daily. Refills sent today.  Continue to check blood pressures at home.  Return in 6 months.       Relevant Medications   hydrochlorothiazide  (HYDRODIURIL ) 25 MG tablet   atorvastatin  (LIPITOR) 40 MG tablet   Other Relevant Orders   Comprehensive metabolic panel with GFR     Endocrine   Diabetes mellitus type 2 with complications (HCC)   Chronic.  Controlled without medication.  A1c has been well controlled at 6.2%.  Does not check sugars.  Denies symptoms of elevated A1c.  Labs ordered today.  Return to clinic in 6 months for reevaluation.  Call sooner if concerns arise.       Relevant Medications    atorvastatin  (LIPITOR) 40 MG tablet   Other Relevant Orders   Hemoglobin A1c     Other   Hyperlipidemia   Chronic, stable. Lipid panel done today. Continue Atorvastatin  40 MG daily, refills given. Return in 6 months.  Labs ordered at visit today.       Relevant Medications   hydrochlorothiazide  (HYDRODIURIL ) 25 MG tablet   atorvastatin  (LIPITOR) 40 MG tablet   Other Relevant Orders   Lipid panel   Depression, recurrent (HCC)   Chronic.  Controlled.  Continue Fluoxetine  to 40mg  daily.  Continue with Hydroxyzine  PRN for anxiety.  Refills sent today. Follow up in 6 months.  Call sooner if concerns arise.       Relevant Medications   FLUoxetine  (PROZAC ) 40 MG capsule   hydrOXYzine  (VISTARIL ) 50 MG capsule   Anxiety   Chronic.  Controlled.  Continue Fluoxetine  to 40mg  daily.  Continue with Hydroxyzine  PRN for anxiety.  Follow up in 6 months.  Call sooner if concerns arise.       Relevant Medications   FLUoxetine  (PROZAC ) 40 MG capsule   hydrOXYzine  (VISTARIL ) 50 MG capsule   Hypokalemia   Labs ordered at visit today.  Will make recommendations based on lab results.        Hypomagnesemia   Labs ordered at visit today.  Will make recommendations based on lab results.        Relevant Orders   Magnesium       Follow up plan: No follow-ups on file.

## 2024-06-22 NOTE — Assessment & Plan Note (Signed)
 Chronic, stable. CMP done today. Continue Hydrochlorothiazide  25 MG daily. Refills sent today.  Continue to check blood pressures at home.  Return in 6 months.

## 2024-06-22 NOTE — Assessment & Plan Note (Signed)
 Chronic.  Controlled without medication.  A1c has been well controlled at 6.2%.  Does not check sugars.  Denies symptoms of elevated A1c.  Labs ordered today.  Return to clinic in 6 months for reevaluation.  Call sooner if concerns arise.

## 2024-06-22 NOTE — Assessment & Plan Note (Signed)
 Chronic.  Controlled.  Continue Fluoxetine  to 40mg  daily.  Continue with Hydroxyzine  PRN for anxiety.  Refills sent today. Follow up in 6 months.  Call sooner if concerns arise.

## 2024-06-22 NOTE — Assessment & Plan Note (Signed)
 Chronic, stable. Lipid panel done today. Continue Atorvastatin  40 MG daily, refills given. Return in 6 months.  Labs ordered at visit today.

## 2024-06-22 NOTE — Assessment & Plan Note (Signed)
 Chronic.  Controlled.  Continue Fluoxetine  to 40mg  daily.  Continue with Hydroxyzine  PRN for anxiety.  Follow up in 6 months.  Call sooner if concerns arise.

## 2024-06-23 ENCOUNTER — Telehealth: Payer: Self-pay

## 2024-06-23 ENCOUNTER — Other Ambulatory Visit (HOSPITAL_COMMUNITY): Payer: Self-pay

## 2024-06-23 ENCOUNTER — Ambulatory Visit: Payer: Self-pay | Admitting: Nurse Practitioner

## 2024-06-23 LAB — COMPREHENSIVE METABOLIC PANEL WITH GFR
ALT: 18 IU/L (ref 0–32)
AST: 16 IU/L (ref 0–40)
Albumin: 3.8 g/dL — ABNORMAL LOW (ref 3.9–4.9)
Alkaline Phosphatase: 129 IU/L — ABNORMAL HIGH (ref 44–121)
BUN/Creatinine Ratio: 20 (ref 12–28)
BUN: 14 mg/dL (ref 8–27)
Bilirubin Total: 0.3 mg/dL (ref 0.0–1.2)
CO2: 24 mmol/L (ref 20–29)
Calcium: 9.6 mg/dL (ref 8.7–10.3)
Chloride: 99 mmol/L (ref 96–106)
Creatinine, Ser: 0.71 mg/dL (ref 0.57–1.00)
Globulin, Total: 2.4 g/dL (ref 1.5–4.5)
Glucose: 102 mg/dL — ABNORMAL HIGH (ref 70–99)
Potassium: 3.9 mmol/L (ref 3.5–5.2)
Sodium: 137 mmol/L (ref 134–144)
Total Protein: 6.2 g/dL (ref 6.0–8.5)
eGFR: 94 mL/min/1.73 (ref >59)

## 2024-06-23 LAB — LIPID PANEL
Chol/HDL Ratio: 3.5 ratio (ref 0.0–4.4)
Cholesterol, Total: 184 mg/dL (ref 100–199)
HDL: 52 mg/dL (ref >39)
LDL Chol Calc (NIH): 109 mg/dL — ABNORMAL HIGH (ref 0–99)
Triglycerides: 127 mg/dL (ref 0–149)
VLDL Cholesterol Cal: 23 mg/dL (ref 5–40)

## 2024-06-23 LAB — HEMOGLOBIN A1C
Est. average glucose Bld gHb Est-mCnc: 137 mg/dL
Hgb A1c MFr Bld: 6.4 % — ABNORMAL HIGH (ref 4.8–5.6)

## 2024-06-23 LAB — MAGNESIUM: Magnesium: 1.6 mg/dL (ref 1.6–2.3)

## 2024-06-23 NOTE — Telephone Encounter (Signed)
 Pharmacy Patient Advocate Encounter   Received notification from CoverMyMeds that prior authorization for hydrOXYzine  HCl 50MG  tablets is required/requested.   Insurance verification completed.   The patient is insured through [facility] .   Per test claim: PA required; PA submitted to above mentioned insurance via CoverMyMeds Key/confirmation #/EOC [id number] Status is pending

## 2024-06-26 ENCOUNTER — Other Ambulatory Visit (HOSPITAL_COMMUNITY): Payer: Self-pay

## 2024-06-28 NOTE — Telephone Encounter (Signed)
 Pharmacy Patient Advocate Encounter  Received notification from [facility] that Prior Authorization for hydrOXYzine  HCl 50MG  tablets  has been APPROVED from 06/22/2024 to until further notice   PA #/Case ID/Reference #: [id number]

## 2024-07-10 ENCOUNTER — Other Ambulatory Visit: Payer: Self-pay | Admitting: Nurse Practitioner

## 2024-07-10 DIAGNOSIS — G809 Cerebral palsy, unspecified: Secondary | ICD-10-CM

## 2024-07-11 NOTE — Telephone Encounter (Signed)
 Requested medications are due for refill today.  See note  Requested medications are on the active medications list.  no  Last refill.   Future visit scheduled.   no  Notes to clinic.  Request is for 10mg  tablet - pt taking 1.5 tablets daily. Med list has pt taking 1 15 mg tablet.     Requested Prescriptions  Pending Prescriptions Disp Refills   baclofen  (LIORESAL ) 10 MG tablet [Pharmacy Med Name: BACLOFEN  10 MG TABLET] 135 tablet 0    Sig: Take 1 1/2 tablet (15 mg total) by mouth every evening.     Analgesics:  Muscle Relaxants - baclofen  Passed - 07/11/2024  2:48 PM      Passed - Cr in normal range and within 180 days    Creatinine, Ser  Date Value Ref Range Status  06/22/2024 0.71 0.57 - 1.00 mg/dL Final         Passed - eGFR is 30 or above and within 180 days    GFR calc Af Amer  Date Value Ref Range Status  07/11/2020 108 >59 mL/min/1.73 Final    Comment:    **[facility] currently reports eGFR in compliance with the current**   recommendations of the [employer]. [facility] will   update reporting as new guidelines are published from the NKF-ASN   Task force.    GFR, Estimated  Date Value Ref Range Status  02/02/2022 >60 >60 mL/min Final    Comment:    (NOTE) Calculated using the CKD-EPI Creatinine Equation (2021)    eGFR  Date Value Ref Range Status  06/22/2024 94 >59 mL/min/1.73 Final         Passed - Valid encounter within last 6 months    Recent Outpatient Visits           2 weeks ago Essential (primary) hypertension   [place] [facility] Melvin Pao, NP   6 months ago Encounter for annual wellness exam in Medicare patient   [place] [facility] Melvin Pao, NP       Future Appointments             In 7 months Stoioff, Glendia BROCKS, MD [facility]

## 2024-11-13 ENCOUNTER — Other Ambulatory Visit: Payer: Self-pay | Admitting: Nurse Practitioner

## 2024-11-13 DIAGNOSIS — G809 Cerebral palsy, unspecified: Secondary | ICD-10-CM

## 2024-12-13 ENCOUNTER — Telehealth: Payer: Self-pay | Admitting: Nurse Practitioner

## 2024-12-13 NOTE — Telephone Encounter (Signed)
 Copied from CRM #[id number]. Topic: Medical Record Request - Provider/Facility Request >> Dec 13, 2024  2:55 PM Michelle Chandler wrote: Reason for CRM: Michelle Chandler called in to report that he sent a fax on 12/08//2025 and will refax today and order need to be signed by Primary Michelle Chandler. Order for pt to be signed on the medicare program.   Fax : [phone]

## 2024-12-14 NOTE — Telephone Encounter (Signed)
Will address when received.

## 2024-12-25 ENCOUNTER — Ambulatory Visit: Admitting: Nurse Practitioner

## 2025-01-10 ENCOUNTER — Ambulatory Visit: Admitting: Nurse Practitioner

## 2025-02-12 ENCOUNTER — Ambulatory Visit: Admitting: Urology
